# Patient Record
Sex: Male | Born: 1935 | Race: White | Hispanic: No | Marital: Married | Smoking: Never smoker
Health system: Southern US, Community
[De-identification: ages and names within clinical notes are randomized; demographics above are authoritative.]

## PROBLEM LIST (undated history)

## (undated) DIAGNOSIS — G20 Parkinson's disease: Secondary | ICD-10-CM

## (undated) DIAGNOSIS — J449 Chronic obstructive pulmonary disease, unspecified: Secondary | ICD-10-CM

## (undated) DIAGNOSIS — I10 Essential (primary) hypertension: Secondary | ICD-10-CM

## (undated) DIAGNOSIS — I890 Lymphedema, not elsewhere classified: Secondary | ICD-10-CM

## (undated) DIAGNOSIS — C801 Malignant (primary) neoplasm, unspecified: Secondary | ICD-10-CM

## (undated) DIAGNOSIS — G20A1 Parkinson's disease without dyskinesia, without mention of fluctuations: Secondary | ICD-10-CM

## (undated) DIAGNOSIS — R251 Tremor, unspecified: Secondary | ICD-10-CM

## (undated) DIAGNOSIS — E785 Hyperlipidemia, unspecified: Secondary | ICD-10-CM

## (undated) DIAGNOSIS — M4800 Spinal stenosis, site unspecified: Secondary | ICD-10-CM

## (undated) HISTORY — DX: Parkinson's disease: G20

## (undated) HISTORY — PX: CATARACT EXTRACTION: SUR2

## (undated) HISTORY — DX: Spinal stenosis, site unspecified: M48.00

## (undated) HISTORY — DX: Chronic obstructive pulmonary disease, unspecified: J44.9

## (undated) HISTORY — DX: Parkinson's disease without dyskinesia, without mention of fluctuations: G20.A1

## (undated) HISTORY — PX: APPENDECTOMY: SHX54

## (undated) HISTORY — PX: VASECTOMY: SHX75

## (undated) HISTORY — PX: TONSILLECTOMY: SHX5217

## (undated) HISTORY — DX: Essential (primary) hypertension: I10

## (undated) HISTORY — DX: Tremor, unspecified: R25.1

## (undated) HISTORY — DX: Malignant (primary) neoplasm, unspecified: C80.1

## (undated) HISTORY — PX: CHOLECYSTECTOMY: SHX55

## (undated) HISTORY — DX: Lymphedema, not elsewhere classified: I89.0

## (undated) HISTORY — DX: Hyperlipidemia, unspecified: E78.5

---

## 2007-03-28 DIAGNOSIS — R079 Chest pain, unspecified: Secondary | ICD-10-CM | POA: Insufficient documentation

## 2010-10-02 DIAGNOSIS — Z85828 Personal history of other malignant neoplasm of skin: Secondary | ICD-10-CM | POA: Insufficient documentation

## 2010-10-02 DIAGNOSIS — Z872 Personal history of diseases of the skin and subcutaneous tissue: Secondary | ICD-10-CM | POA: Insufficient documentation

## 2011-08-06 DIAGNOSIS — R197 Diarrhea, unspecified: Secondary | ICD-10-CM | POA: Diagnosis not present

## 2011-08-06 DIAGNOSIS — K5730 Diverticulosis of large intestine without perforation or abscess without bleeding: Secondary | ICD-10-CM | POA: Diagnosis not present

## 2011-08-06 DIAGNOSIS — R110 Nausea: Secondary | ICD-10-CM | POA: Diagnosis not present

## 2011-08-06 DIAGNOSIS — R109 Unspecified abdominal pain: Secondary | ICD-10-CM | POA: Diagnosis not present

## 2011-08-06 DIAGNOSIS — R1032 Left lower quadrant pain: Secondary | ICD-10-CM | POA: Diagnosis not present

## 2011-08-25 DIAGNOSIS — H2510 Age-related nuclear cataract, unspecified eye: Secondary | ICD-10-CM | POA: Diagnosis not present

## 2011-08-25 DIAGNOSIS — H04129 Dry eye syndrome of unspecified lacrimal gland: Secondary | ICD-10-CM | POA: Diagnosis not present

## 2011-09-08 DIAGNOSIS — H259 Unspecified age-related cataract: Secondary | ICD-10-CM | POA: Diagnosis not present

## 2011-09-08 DIAGNOSIS — H2510 Age-related nuclear cataract, unspecified eye: Secondary | ICD-10-CM | POA: Diagnosis not present

## 2011-09-28 DIAGNOSIS — I10 Essential (primary) hypertension: Secondary | ICD-10-CM | POA: Diagnosis not present

## 2011-09-28 DIAGNOSIS — Z125 Encounter for screening for malignant neoplasm of prostate: Secondary | ICD-10-CM | POA: Diagnosis not present

## 2011-09-28 DIAGNOSIS — G4700 Insomnia, unspecified: Secondary | ICD-10-CM | POA: Diagnosis not present

## 2011-09-28 DIAGNOSIS — K219 Gastro-esophageal reflux disease without esophagitis: Secondary | ICD-10-CM | POA: Diagnosis not present

## 2011-09-28 DIAGNOSIS — E785 Hyperlipidemia, unspecified: Secondary | ICD-10-CM | POA: Diagnosis not present

## 2011-09-28 DIAGNOSIS — E786 Lipoprotein deficiency: Secondary | ICD-10-CM | POA: Diagnosis not present

## 2011-09-28 DIAGNOSIS — Z0000 Encounter for general adult medical examination without abnormal findings: Secondary | ICD-10-CM | POA: Diagnosis not present

## 2011-09-28 DIAGNOSIS — R5381 Other malaise: Secondary | ICD-10-CM | POA: Diagnosis not present

## 2011-10-07 DIAGNOSIS — Z1212 Encounter for screening for malignant neoplasm of rectum: Secondary | ICD-10-CM | POA: Diagnosis not present

## 2011-10-12 DIAGNOSIS — L909 Atrophic disorder of skin, unspecified: Secondary | ICD-10-CM | POA: Diagnosis not present

## 2011-10-12 DIAGNOSIS — L820 Inflamed seborrheic keratosis: Secondary | ICD-10-CM | POA: Diagnosis not present

## 2011-10-12 DIAGNOSIS — L570 Actinic keratosis: Secondary | ICD-10-CM | POA: Diagnosis not present

## 2011-10-12 DIAGNOSIS — Z85828 Personal history of other malignant neoplasm of skin: Secondary | ICD-10-CM | POA: Diagnosis not present

## 2011-10-12 DIAGNOSIS — L919 Hypertrophic disorder of the skin, unspecified: Secondary | ICD-10-CM | POA: Diagnosis not present

## 2011-12-14 DIAGNOSIS — Z961 Presence of intraocular lens: Secondary | ICD-10-CM | POA: Diagnosis not present

## 2012-03-28 DIAGNOSIS — I10 Essential (primary) hypertension: Secondary | ICD-10-CM | POA: Diagnosis not present

## 2012-03-28 DIAGNOSIS — IMO0002 Reserved for concepts with insufficient information to code with codable children: Secondary | ICD-10-CM | POA: Diagnosis not present

## 2012-03-28 DIAGNOSIS — M1710 Unilateral primary osteoarthritis, unspecified knee: Secondary | ICD-10-CM | POA: Diagnosis not present

## 2012-03-28 DIAGNOSIS — E785 Hyperlipidemia, unspecified: Secondary | ICD-10-CM | POA: Diagnosis not present

## 2012-03-28 DIAGNOSIS — K219 Gastro-esophageal reflux disease without esophagitis: Secondary | ICD-10-CM | POA: Diagnosis not present

## 2012-03-29 DIAGNOSIS — N509 Disorder of male genital organs, unspecified: Secondary | ICD-10-CM | POA: Diagnosis not present

## 2012-03-29 DIAGNOSIS — N400 Enlarged prostate without lower urinary tract symptoms: Secondary | ICD-10-CM | POA: Diagnosis not present

## 2012-04-14 DIAGNOSIS — Z1389 Encounter for screening for other disorder: Secondary | ICD-10-CM | POA: Insufficient documentation

## 2012-04-14 DIAGNOSIS — Z872 Personal history of diseases of the skin and subcutaneous tissue: Secondary | ICD-10-CM | POA: Diagnosis not present

## 2012-04-14 DIAGNOSIS — Z85828 Personal history of other malignant neoplasm of skin: Secondary | ICD-10-CM | POA: Diagnosis not present

## 2012-04-14 DIAGNOSIS — L820 Inflamed seborrheic keratosis: Secondary | ICD-10-CM | POA: Diagnosis not present

## 2012-05-08 DIAGNOSIS — Z800 Family history of malignant neoplasm of digestive organs: Secondary | ICD-10-CM | POA: Diagnosis not present

## 2012-05-08 DIAGNOSIS — Z23 Encounter for immunization: Secondary | ICD-10-CM | POA: Diagnosis not present

## 2012-05-08 DIAGNOSIS — C44319 Basal cell carcinoma of skin of other parts of face: Secondary | ICD-10-CM | POA: Diagnosis not present

## 2012-05-08 DIAGNOSIS — C44721 Squamous cell carcinoma of skin of unspecified lower limb, including hip: Secondary | ICD-10-CM | POA: Diagnosis not present

## 2012-05-08 DIAGNOSIS — M4802 Spinal stenosis, cervical region: Secondary | ICD-10-CM | POA: Diagnosis not present

## 2012-05-08 DIAGNOSIS — I6350 Cerebral infarction due to unspecified occlusion or stenosis of unspecified cerebral artery: Secondary | ICD-10-CM | POA: Diagnosis not present

## 2012-05-08 DIAGNOSIS — M5126 Other intervertebral disc displacement, lumbar region: Secondary | ICD-10-CM | POA: Diagnosis not present

## 2012-05-08 DIAGNOSIS — M48061 Spinal stenosis, lumbar region without neurogenic claudication: Secondary | ICD-10-CM | POA: Diagnosis not present

## 2012-10-04 DIAGNOSIS — M48061 Spinal stenosis, lumbar region without neurogenic claudication: Secondary | ICD-10-CM | POA: Diagnosis not present

## 2012-10-04 DIAGNOSIS — M4802 Spinal stenosis, cervical region: Secondary | ICD-10-CM | POA: Diagnosis not present

## 2012-10-04 DIAGNOSIS — Z23 Encounter for immunization: Secondary | ICD-10-CM | POA: Diagnosis not present

## 2012-10-04 DIAGNOSIS — I6350 Cerebral infarction due to unspecified occlusion or stenosis of unspecified cerebral artery: Secondary | ICD-10-CM | POA: Diagnosis not present

## 2012-10-04 DIAGNOSIS — Z125 Encounter for screening for malignant neoplasm of prostate: Secondary | ICD-10-CM | POA: Diagnosis not present

## 2012-10-04 DIAGNOSIS — C44721 Squamous cell carcinoma of skin of unspecified lower limb, including hip: Secondary | ICD-10-CM | POA: Diagnosis not present

## 2012-10-04 DIAGNOSIS — IMO0002 Reserved for concepts with insufficient information to code with codable children: Secondary | ICD-10-CM | POA: Diagnosis not present

## 2012-10-04 DIAGNOSIS — Z800 Family history of malignant neoplasm of digestive organs: Secondary | ICD-10-CM | POA: Diagnosis not present

## 2012-10-04 DIAGNOSIS — I10 Essential (primary) hypertension: Secondary | ICD-10-CM | POA: Diagnosis not present

## 2012-10-04 DIAGNOSIS — E785 Hyperlipidemia, unspecified: Secondary | ICD-10-CM | POA: Diagnosis not present

## 2012-10-04 DIAGNOSIS — M5126 Other intervertebral disc displacement, lumbar region: Secondary | ICD-10-CM | POA: Diagnosis not present

## 2012-10-16 DIAGNOSIS — Z1212 Encounter for screening for malignant neoplasm of rectum: Secondary | ICD-10-CM | POA: Diagnosis not present

## 2012-11-03 DIAGNOSIS — L821 Other seborrheic keratosis: Secondary | ICD-10-CM | POA: Diagnosis not present

## 2012-11-03 DIAGNOSIS — Z85828 Personal history of other malignant neoplasm of skin: Secondary | ICD-10-CM | POA: Diagnosis not present

## 2012-11-03 DIAGNOSIS — L570 Actinic keratosis: Secondary | ICD-10-CM | POA: Diagnosis not present

## 2012-11-06 DIAGNOSIS — H35039 Hypertensive retinopathy, unspecified eye: Secondary | ICD-10-CM | POA: Diagnosis not present

## 2012-11-06 DIAGNOSIS — H2589 Other age-related cataract: Secondary | ICD-10-CM | POA: Diagnosis not present

## 2012-11-06 DIAGNOSIS — Z961 Presence of intraocular lens: Secondary | ICD-10-CM | POA: Diagnosis not present

## 2012-11-06 DIAGNOSIS — H04129 Dry eye syndrome of unspecified lacrimal gland: Secondary | ICD-10-CM | POA: Diagnosis not present

## 2012-11-07 DIAGNOSIS — Z1211 Encounter for screening for malignant neoplasm of colon: Secondary | ICD-10-CM | POA: Diagnosis not present

## 2012-11-07 DIAGNOSIS — D126 Benign neoplasm of colon, unspecified: Secondary | ICD-10-CM | POA: Diagnosis not present

## 2012-11-08 DIAGNOSIS — M48061 Spinal stenosis, lumbar region without neurogenic claudication: Secondary | ICD-10-CM | POA: Diagnosis not present

## 2012-11-08 DIAGNOSIS — R259 Unspecified abnormal involuntary movements: Secondary | ICD-10-CM | POA: Diagnosis not present

## 2012-11-08 DIAGNOSIS — M4802 Spinal stenosis, cervical region: Secondary | ICD-10-CM | POA: Diagnosis not present

## 2012-11-08 DIAGNOSIS — G20 Parkinson's disease: Secondary | ICD-10-CM | POA: Diagnosis not present

## 2012-11-09 DIAGNOSIS — M4802 Spinal stenosis, cervical region: Secondary | ICD-10-CM | POA: Diagnosis not present

## 2012-11-09 DIAGNOSIS — M48061 Spinal stenosis, lumbar region without neurogenic claudication: Secondary | ICD-10-CM | POA: Diagnosis not present

## 2012-11-09 DIAGNOSIS — R259 Unspecified abnormal involuntary movements: Secondary | ICD-10-CM | POA: Diagnosis not present

## 2012-11-09 DIAGNOSIS — G20 Parkinson's disease: Secondary | ICD-10-CM | POA: Diagnosis not present

## 2013-02-23 DIAGNOSIS — M48061 Spinal stenosis, lumbar region without neurogenic claudication: Secondary | ICD-10-CM | POA: Diagnosis not present

## 2013-02-23 DIAGNOSIS — R259 Unspecified abnormal involuntary movements: Secondary | ICD-10-CM | POA: Diagnosis not present

## 2013-02-23 DIAGNOSIS — G20 Parkinson's disease: Secondary | ICD-10-CM | POA: Diagnosis not present

## 2013-02-23 DIAGNOSIS — M4802 Spinal stenosis, cervical region: Secondary | ICD-10-CM | POA: Diagnosis not present

## 2013-03-05 DIAGNOSIS — R1084 Generalized abdominal pain: Secondary | ICD-10-CM | POA: Diagnosis not present

## 2013-03-05 DIAGNOSIS — M438X9 Other specified deforming dorsopathies, site unspecified: Secondary | ICD-10-CM | POA: Diagnosis not present

## 2013-03-05 DIAGNOSIS — K5900 Constipation, unspecified: Secondary | ICD-10-CM | POA: Diagnosis not present

## 2013-03-05 DIAGNOSIS — K219 Gastro-esophageal reflux disease without esophagitis: Secondary | ICD-10-CM | POA: Diagnosis not present

## 2013-03-29 DIAGNOSIS — M48061 Spinal stenosis, lumbar region without neurogenic claudication: Secondary | ICD-10-CM | POA: Diagnosis not present

## 2013-03-29 DIAGNOSIS — Z800 Family history of malignant neoplasm of digestive organs: Secondary | ICD-10-CM | POA: Diagnosis not present

## 2013-03-29 DIAGNOSIS — J309 Allergic rhinitis, unspecified: Secondary | ICD-10-CM | POA: Diagnosis not present

## 2013-03-29 DIAGNOSIS — M5126 Other intervertebral disc displacement, lumbar region: Secondary | ICD-10-CM | POA: Diagnosis not present

## 2013-03-29 DIAGNOSIS — M1710 Unilateral primary osteoarthritis, unspecified knee: Secondary | ICD-10-CM | POA: Diagnosis not present

## 2013-03-29 DIAGNOSIS — IMO0002 Reserved for concepts with insufficient information to code with codable children: Secondary | ICD-10-CM | POA: Diagnosis not present

## 2013-03-29 DIAGNOSIS — M4802 Spinal stenosis, cervical region: Secondary | ICD-10-CM | POA: Diagnosis not present

## 2013-03-29 DIAGNOSIS — I6350 Cerebral infarction due to unspecified occlusion or stenosis of unspecified cerebral artery: Secondary | ICD-10-CM | POA: Diagnosis not present

## 2013-03-29 DIAGNOSIS — K5730 Diverticulosis of large intestine without perforation or abscess without bleeding: Secondary | ICD-10-CM | POA: Diagnosis not present

## 2013-03-29 DIAGNOSIS — I10 Essential (primary) hypertension: Secondary | ICD-10-CM | POA: Diagnosis not present

## 2013-03-30 DIAGNOSIS — N400 Enlarged prostate without lower urinary tract symptoms: Secondary | ICD-10-CM | POA: Diagnosis not present

## 2013-04-18 DIAGNOSIS — K3189 Other diseases of stomach and duodenum: Secondary | ICD-10-CM | POA: Diagnosis not present

## 2013-04-18 DIAGNOSIS — D131 Benign neoplasm of stomach: Secondary | ICD-10-CM | POA: Diagnosis not present

## 2013-04-18 DIAGNOSIS — K219 Gastro-esophageal reflux disease without esophagitis: Secondary | ICD-10-CM | POA: Diagnosis not present

## 2013-05-04 DIAGNOSIS — Z23 Encounter for immunization: Secondary | ICD-10-CM | POA: Diagnosis not present

## 2013-05-11 DIAGNOSIS — B351 Tinea unguium: Secondary | ICD-10-CM | POA: Diagnosis not present

## 2013-05-11 DIAGNOSIS — Z872 Personal history of diseases of the skin and subcutaneous tissue: Secondary | ICD-10-CM | POA: Diagnosis not present

## 2013-05-11 DIAGNOSIS — Z85828 Personal history of other malignant neoplasm of skin: Secondary | ICD-10-CM | POA: Diagnosis not present

## 2013-05-16 DIAGNOSIS — H4010X0 Unspecified open-angle glaucoma, stage unspecified: Secondary | ICD-10-CM | POA: Diagnosis not present

## 2013-06-01 DIAGNOSIS — R259 Unspecified abnormal involuntary movements: Secondary | ICD-10-CM | POA: Diagnosis not present

## 2013-06-01 DIAGNOSIS — G20 Parkinson's disease: Secondary | ICD-10-CM | POA: Diagnosis not present

## 2013-06-01 DIAGNOSIS — M4802 Spinal stenosis, cervical region: Secondary | ICD-10-CM | POA: Diagnosis not present

## 2013-06-01 DIAGNOSIS — M48061 Spinal stenosis, lumbar region without neurogenic claudication: Secondary | ICD-10-CM | POA: Diagnosis not present

## 2013-06-07 DIAGNOSIS — G20 Parkinson's disease: Secondary | ICD-10-CM | POA: Diagnosis not present

## 2013-06-07 DIAGNOSIS — M47812 Spondylosis without myelopathy or radiculopathy, cervical region: Secondary | ICD-10-CM | POA: Diagnosis not present

## 2013-06-07 DIAGNOSIS — R42 Dizziness and giddiness: Secondary | ICD-10-CM | POA: Diagnosis not present

## 2013-06-07 DIAGNOSIS — M5030 Other cervical disc degeneration, unspecified cervical region: Secondary | ICD-10-CM | POA: Diagnosis not present

## 2013-06-12 DIAGNOSIS — H179 Unspecified corneal scar and opacity: Secondary | ICD-10-CM | POA: Diagnosis not present

## 2013-06-12 DIAGNOSIS — H35379 Puckering of macula, unspecified eye: Secondary | ICD-10-CM | POA: Diagnosis not present

## 2013-06-12 DIAGNOSIS — E119 Type 2 diabetes mellitus without complications: Secondary | ICD-10-CM | POA: Diagnosis not present

## 2013-06-12 DIAGNOSIS — H4010X0 Unspecified open-angle glaucoma, stage unspecified: Secondary | ICD-10-CM | POA: Diagnosis not present

## 2013-07-06 DIAGNOSIS — G20 Parkinson's disease: Secondary | ICD-10-CM | POA: Diagnosis not present

## 2013-07-06 DIAGNOSIS — R259 Unspecified abnormal involuntary movements: Secondary | ICD-10-CM | POA: Diagnosis not present

## 2013-07-06 DIAGNOSIS — M4802 Spinal stenosis, cervical region: Secondary | ICD-10-CM | POA: Diagnosis not present

## 2013-07-06 DIAGNOSIS — M48061 Spinal stenosis, lumbar region without neurogenic claudication: Secondary | ICD-10-CM | POA: Diagnosis not present

## 2013-09-26 DIAGNOSIS — B029 Zoster without complications: Secondary | ICD-10-CM | POA: Diagnosis not present

## 2013-10-16 DIAGNOSIS — R0789 Other chest pain: Secondary | ICD-10-CM | POA: Diagnosis not present

## 2013-10-16 DIAGNOSIS — N400 Enlarged prostate without lower urinary tract symptoms: Secondary | ICD-10-CM | POA: Diagnosis not present

## 2013-10-16 DIAGNOSIS — Z85828 Personal history of other malignant neoplasm of skin: Secondary | ICD-10-CM | POA: Diagnosis not present

## 2013-10-16 DIAGNOSIS — Z885 Allergy status to narcotic agent status: Secondary | ICD-10-CM | POA: Diagnosis not present

## 2013-10-16 DIAGNOSIS — R079 Chest pain, unspecified: Secondary | ICD-10-CM | POA: Diagnosis not present

## 2013-10-16 DIAGNOSIS — K219 Gastro-esophageal reflux disease without esophagitis: Secondary | ICD-10-CM | POA: Diagnosis not present

## 2013-10-16 DIAGNOSIS — E7800 Pure hypercholesterolemia, unspecified: Secondary | ICD-10-CM | POA: Diagnosis not present

## 2013-10-16 DIAGNOSIS — Z79899 Other long term (current) drug therapy: Secondary | ICD-10-CM | POA: Diagnosis not present

## 2013-10-16 DIAGNOSIS — Z886 Allergy status to analgesic agent status: Secondary | ICD-10-CM | POA: Diagnosis not present

## 2013-10-16 DIAGNOSIS — Z7982 Long term (current) use of aspirin: Secondary | ICD-10-CM | POA: Diagnosis not present

## 2013-10-16 DIAGNOSIS — I10 Essential (primary) hypertension: Secondary | ICD-10-CM | POA: Diagnosis not present

## 2013-10-17 DIAGNOSIS — K219 Gastro-esophageal reflux disease without esophagitis: Secondary | ICD-10-CM | POA: Diagnosis not present

## 2013-10-17 DIAGNOSIS — R079 Chest pain, unspecified: Secondary | ICD-10-CM | POA: Diagnosis not present

## 2013-10-24 DIAGNOSIS — R079 Chest pain, unspecified: Secondary | ICD-10-CM | POA: Diagnosis not present

## 2013-10-31 DIAGNOSIS — R079 Chest pain, unspecified: Secondary | ICD-10-CM | POA: Diagnosis not present

## 2013-11-01 DIAGNOSIS — R259 Unspecified abnormal involuntary movements: Secondary | ICD-10-CM | POA: Diagnosis not present

## 2013-11-01 DIAGNOSIS — E782 Mixed hyperlipidemia: Secondary | ICD-10-CM | POA: Diagnosis not present

## 2013-11-01 DIAGNOSIS — N138 Other obstructive and reflux uropathy: Secondary | ICD-10-CM | POA: Diagnosis not present

## 2013-11-01 DIAGNOSIS — I6350 Cerebral infarction due to unspecified occlusion or stenosis of unspecified cerebral artery: Secondary | ICD-10-CM | POA: Diagnosis not present

## 2013-11-01 DIAGNOSIS — Z0000 Encounter for general adult medical examination without abnormal findings: Secondary | ICD-10-CM | POA: Diagnosis not present

## 2013-11-01 DIAGNOSIS — R079 Chest pain, unspecified: Secondary | ICD-10-CM | POA: Diagnosis not present

## 2013-11-01 DIAGNOSIS — K5730 Diverticulosis of large intestine without perforation or abscess without bleeding: Secondary | ICD-10-CM | POA: Diagnosis not present

## 2013-11-01 DIAGNOSIS — N401 Enlarged prostate with lower urinary tract symptoms: Secondary | ICD-10-CM | POA: Diagnosis not present

## 2013-11-01 DIAGNOSIS — I10 Essential (primary) hypertension: Secondary | ICD-10-CM | POA: Diagnosis not present

## 2013-11-06 DIAGNOSIS — I10 Essential (primary) hypertension: Secondary | ICD-10-CM | POA: Diagnosis not present

## 2013-11-06 DIAGNOSIS — K5730 Diverticulosis of large intestine without perforation or abscess without bleeding: Secondary | ICD-10-CM | POA: Diagnosis not present

## 2013-11-06 DIAGNOSIS — E782 Mixed hyperlipidemia: Secondary | ICD-10-CM | POA: Diagnosis not present

## 2013-11-06 DIAGNOSIS — N401 Enlarged prostate with lower urinary tract symptoms: Secondary | ICD-10-CM | POA: Diagnosis not present

## 2013-11-06 DIAGNOSIS — J9801 Acute bronchospasm: Secondary | ICD-10-CM | POA: Diagnosis not present

## 2013-11-06 DIAGNOSIS — R259 Unspecified abnormal involuntary movements: Secondary | ICD-10-CM | POA: Diagnosis not present

## 2013-11-06 DIAGNOSIS — N138 Other obstructive and reflux uropathy: Secondary | ICD-10-CM | POA: Diagnosis not present

## 2013-11-12 DIAGNOSIS — H2510 Age-related nuclear cataract, unspecified eye: Secondary | ICD-10-CM | POA: Diagnosis not present

## 2013-11-12 DIAGNOSIS — Z961 Presence of intraocular lens: Secondary | ICD-10-CM | POA: Diagnosis not present

## 2013-11-12 DIAGNOSIS — H04129 Dry eye syndrome of unspecified lacrimal gland: Secondary | ICD-10-CM | POA: Diagnosis not present

## 2013-11-16 DIAGNOSIS — L570 Actinic keratosis: Secondary | ICD-10-CM | POA: Diagnosis not present

## 2013-11-16 DIAGNOSIS — C44319 Basal cell carcinoma of skin of other parts of face: Secondary | ICD-10-CM | POA: Diagnosis not present

## 2013-11-16 DIAGNOSIS — L821 Other seborrheic keratosis: Secondary | ICD-10-CM | POA: Diagnosis not present

## 2013-11-16 DIAGNOSIS — Z872 Personal history of diseases of the skin and subcutaneous tissue: Secondary | ICD-10-CM | POA: Diagnosis not present

## 2013-11-16 DIAGNOSIS — Z85828 Personal history of other malignant neoplasm of skin: Secondary | ICD-10-CM | POA: Diagnosis not present

## 2013-11-30 DIAGNOSIS — M4802 Spinal stenosis, cervical region: Secondary | ICD-10-CM | POA: Insufficient documentation

## 2013-11-30 DIAGNOSIS — G20 Parkinson's disease: Secondary | ICD-10-CM | POA: Diagnosis not present

## 2013-11-30 DIAGNOSIS — R251 Tremor, unspecified: Secondary | ICD-10-CM | POA: Insufficient documentation

## 2013-11-30 DIAGNOSIS — R259 Unspecified abnormal involuntary movements: Secondary | ICD-10-CM | POA: Diagnosis not present

## 2013-11-30 DIAGNOSIS — M48061 Spinal stenosis, lumbar region without neurogenic claudication: Secondary | ICD-10-CM | POA: Diagnosis not present

## 2013-12-25 DIAGNOSIS — C4401 Basal cell carcinoma of skin of lip: Secondary | ICD-10-CM | POA: Diagnosis not present

## 2014-02-01 DIAGNOSIS — Z23 Encounter for immunization: Secondary | ICD-10-CM | POA: Diagnosis not present

## 2014-02-18 DIAGNOSIS — G20 Parkinson's disease: Secondary | ICD-10-CM | POA: Diagnosis not present

## 2014-02-18 DIAGNOSIS — M4802 Spinal stenosis, cervical region: Secondary | ICD-10-CM | POA: Diagnosis not present

## 2014-02-18 DIAGNOSIS — R259 Unspecified abnormal involuntary movements: Secondary | ICD-10-CM | POA: Diagnosis not present

## 2014-02-18 DIAGNOSIS — M48061 Spinal stenosis, lumbar region without neurogenic claudication: Secondary | ICD-10-CM | POA: Diagnosis not present

## 2014-03-07 ENCOUNTER — Other Ambulatory Visit: Payer: Self-pay | Admitting: Unknown Physician Specialty

## 2014-03-07 ENCOUNTER — Ambulatory Visit (INDEPENDENT_AMBULATORY_CARE_PROVIDER_SITE_OTHER): Payer: Medicare Other

## 2014-03-07 DIAGNOSIS — R059 Cough, unspecified: Secondary | ICD-10-CM

## 2014-03-07 DIAGNOSIS — R05 Cough: Secondary | ICD-10-CM | POA: Diagnosis not present

## 2014-03-07 DIAGNOSIS — J9801 Acute bronchospasm: Secondary | ICD-10-CM | POA: Diagnosis not present

## 2014-03-29 DIAGNOSIS — Z23 Encounter for immunization: Secondary | ICD-10-CM | POA: Diagnosis not present

## 2014-04-30 DIAGNOSIS — E782 Mixed hyperlipidemia: Secondary | ICD-10-CM | POA: Diagnosis not present

## 2014-04-30 DIAGNOSIS — I10 Essential (primary) hypertension: Secondary | ICD-10-CM | POA: Diagnosis not present

## 2014-04-30 DIAGNOSIS — K219 Gastro-esophageal reflux disease without esophagitis: Secondary | ICD-10-CM | POA: Diagnosis not present

## 2014-05-20 DIAGNOSIS — M4802 Spinal stenosis, cervical region: Secondary | ICD-10-CM | POA: Diagnosis not present

## 2014-05-20 DIAGNOSIS — G20 Parkinson's disease: Secondary | ICD-10-CM | POA: Diagnosis not present

## 2014-05-20 DIAGNOSIS — R251 Tremor, unspecified: Secondary | ICD-10-CM | POA: Diagnosis not present

## 2014-05-20 DIAGNOSIS — M4806 Spinal stenosis, lumbar region: Secondary | ICD-10-CM | POA: Diagnosis not present

## 2014-05-21 DIAGNOSIS — Z872 Personal history of diseases of the skin and subcutaneous tissue: Secondary | ICD-10-CM | POA: Diagnosis not present

## 2014-05-21 DIAGNOSIS — Z85828 Personal history of other malignant neoplasm of skin: Secondary | ICD-10-CM | POA: Diagnosis not present

## 2014-07-31 DIAGNOSIS — I8312 Varicose veins of left lower extremity with inflammation: Secondary | ICD-10-CM | POA: Diagnosis not present

## 2014-07-31 DIAGNOSIS — R062 Wheezing: Secondary | ICD-10-CM | POA: Diagnosis not present

## 2014-07-31 DIAGNOSIS — R600 Localized edema: Secondary | ICD-10-CM | POA: Diagnosis not present

## 2014-08-19 DIAGNOSIS — G20 Parkinson's disease: Secondary | ICD-10-CM | POA: Diagnosis not present

## 2014-08-19 DIAGNOSIS — R251 Tremor, unspecified: Secondary | ICD-10-CM | POA: Diagnosis not present

## 2014-08-19 DIAGNOSIS — M4806 Spinal stenosis, lumbar region: Secondary | ICD-10-CM | POA: Diagnosis not present

## 2014-08-19 DIAGNOSIS — M4802 Spinal stenosis, cervical region: Secondary | ICD-10-CM | POA: Diagnosis not present

## 2014-09-09 DIAGNOSIS — R251 Tremor, unspecified: Secondary | ICD-10-CM | POA: Diagnosis not present

## 2014-09-09 DIAGNOSIS — M4806 Spinal stenosis, lumbar region: Secondary | ICD-10-CM | POA: Diagnosis not present

## 2014-09-09 DIAGNOSIS — G20 Parkinson's disease: Secondary | ICD-10-CM | POA: Diagnosis not present

## 2014-09-09 DIAGNOSIS — M4802 Spinal stenosis, cervical region: Secondary | ICD-10-CM | POA: Diagnosis not present

## 2014-09-13 DIAGNOSIS — R600 Localized edema: Secondary | ICD-10-CM | POA: Diagnosis not present

## 2014-10-26 ENCOUNTER — Emergency Department
Admit: 2014-10-26 | Disposition: A | Discharge status: Home / Self Care | Payer: Self-pay | Admitting: Emergency Medicine

## 2014-10-26 DIAGNOSIS — I10 Essential (primary) hypertension: Secondary | ICD-10-CM | POA: Diagnosis not present

## 2014-10-26 DIAGNOSIS — Z7982 Long term (current) use of aspirin: Secondary | ICD-10-CM | POA: Diagnosis not present

## 2014-10-26 DIAGNOSIS — Z79899 Other long term (current) drug therapy: Secondary | ICD-10-CM | POA: Diagnosis not present

## 2014-10-27 LAB — BASIC METABOLIC PANEL
Anion Gap: 8 (ref 7–16)
BUN: 13 mg/dL
Calcium, Total: 9.2 mg/dL
Chloride: 103 mmol/L
Co2: 28 mmol/L
Creatinine: 0.96 mg/dL
EGFR (African American): >60
EGFR (Non-African Amer.): >60
Glucose: 111 mg/dL — ABNORMAL HIGH
Potassium: 3.5 mmol/L
Sodium: 139 mmol/L

## 2014-10-27 LAB — CBC
HCT: 46.4 % (ref 40.0–52.0)
HGB: 15.8 g/dL (ref 13.0–18.0)
MCH: 30.7 pg (ref 26.0–34.0)
MCHC: 34.1 g/dL (ref 32.0–36.0)
MCV: 90 fL (ref 80–100)
Platelet: 179 10*3/uL (ref 150–440)
RBC: 5.16 10*6/uL (ref 4.40–5.90)
RDW: 14.7 % — ABNORMAL HIGH (ref 11.5–14.5)
WBC: 7.3 10*3/uL (ref 3.8–10.6)

## 2014-10-27 LAB — TROPONIN I: Troponin-I: <0.03 ng/mL

## 2014-11-08 DIAGNOSIS — R251 Tremor, unspecified: Secondary | ICD-10-CM | POA: Diagnosis not present

## 2014-11-08 DIAGNOSIS — M4806 Spinal stenosis, lumbar region: Secondary | ICD-10-CM | POA: Diagnosis not present

## 2014-11-08 DIAGNOSIS — M4802 Spinal stenosis, cervical region: Secondary | ICD-10-CM | POA: Diagnosis not present

## 2014-11-08 DIAGNOSIS — G20 Parkinson's disease: Secondary | ICD-10-CM | POA: Diagnosis not present

## 2014-11-13 DIAGNOSIS — Z79899 Other long term (current) drug therapy: Secondary | ICD-10-CM | POA: Diagnosis not present

## 2014-11-13 DIAGNOSIS — K5790 Diverticulosis of intestine, part unspecified, without perforation or abscess without bleeding: Secondary | ICD-10-CM | POA: Diagnosis not present

## 2014-11-13 DIAGNOSIS — Z23 Encounter for immunization: Secondary | ICD-10-CM | POA: Diagnosis not present

## 2014-11-13 DIAGNOSIS — N400 Enlarged prostate without lower urinary tract symptoms: Secondary | ICD-10-CM | POA: Diagnosis not present

## 2014-11-13 DIAGNOSIS — E782 Mixed hyperlipidemia: Secondary | ICD-10-CM | POA: Diagnosis not present

## 2014-11-13 DIAGNOSIS — I10 Essential (primary) hypertension: Secondary | ICD-10-CM | POA: Diagnosis not present

## 2014-11-13 DIAGNOSIS — Z0000 Encounter for general adult medical examination without abnormal findings: Secondary | ICD-10-CM | POA: Diagnosis not present

## 2014-11-13 DIAGNOSIS — Z1211 Encounter for screening for malignant neoplasm of colon: Secondary | ICD-10-CM | POA: Diagnosis not present

## 2014-11-21 DIAGNOSIS — Z79899 Other long term (current) drug therapy: Secondary | ICD-10-CM | POA: Diagnosis not present

## 2014-11-21 DIAGNOSIS — Z1211 Encounter for screening for malignant neoplasm of colon: Secondary | ICD-10-CM | POA: Diagnosis not present

## 2014-11-22 DIAGNOSIS — Z85828 Personal history of other malignant neoplasm of skin: Secondary | ICD-10-CM | POA: Diagnosis not present

## 2014-11-22 DIAGNOSIS — Z872 Personal history of diseases of the skin and subcutaneous tissue: Secondary | ICD-10-CM | POA: Diagnosis not present

## 2015-01-10 DIAGNOSIS — R251 Tremor, unspecified: Secondary | ICD-10-CM | POA: Diagnosis not present

## 2015-01-10 DIAGNOSIS — M4806 Spinal stenosis, lumbar region: Secondary | ICD-10-CM | POA: Diagnosis not present

## 2015-01-10 DIAGNOSIS — M4802 Spinal stenosis, cervical region: Secondary | ICD-10-CM | POA: Diagnosis not present

## 2015-01-10 DIAGNOSIS — G20 Parkinson's disease: Secondary | ICD-10-CM | POA: Diagnosis not present

## 2015-02-10 DIAGNOSIS — H04123 Dry eye syndrome of bilateral lacrimal glands: Secondary | ICD-10-CM | POA: Diagnosis not present

## 2015-02-10 DIAGNOSIS — H35033 Hypertensive retinopathy, bilateral: Secondary | ICD-10-CM | POA: Diagnosis not present

## 2015-02-10 DIAGNOSIS — Z79899 Other long term (current) drug therapy: Secondary | ICD-10-CM | POA: Diagnosis not present

## 2015-02-10 DIAGNOSIS — H2511 Age-related nuclear cataract, right eye: Secondary | ICD-10-CM | POA: Diagnosis not present

## 2015-02-21 ENCOUNTER — Ambulatory Visit (INDEPENDENT_AMBULATORY_CARE_PROVIDER_SITE_OTHER): Payer: Medicare Other | Admitting: Neurology

## 2015-02-21 ENCOUNTER — Encounter: Payer: Self-pay | Admitting: Neurology

## 2015-02-21 VITALS — BP 106/73 | HR 96 | Resp 16 | Ht 70.5 in | Wt 191.0 lb

## 2015-02-21 DIAGNOSIS — M25561 Pain in right knee: Secondary | ICD-10-CM

## 2015-02-21 DIAGNOSIS — G20A1 Parkinson's disease without dyskinesia, without mention of fluctuations: Secondary | ICD-10-CM | POA: Insufficient documentation

## 2015-02-21 DIAGNOSIS — G20 Parkinson's disease: Secondary | ICD-10-CM | POA: Diagnosis not present

## 2015-02-21 DIAGNOSIS — G20C Parkinsonism, unspecified: Secondary | ICD-10-CM

## 2015-02-21 DIAGNOSIS — Z789 Other specified health status: Secondary | ICD-10-CM

## 2015-02-21 HISTORY — DX: Parkinson's disease: G20

## 2015-02-21 HISTORY — DX: Parkinsonism, unspecified: G20.C

## 2015-02-21 MED ORDER — CARBIDOPA-LEVODOPA ER 23.75-95 MG PO CPCR: 95.0000 mg | ORAL_CAPSULE | Freq: Three times a day (TID) | ORAL | Status: DC

## 2015-02-21 NOTE — Patient Instructions (Signed)
We will try for your left sided parkinsonism: Rytary 95 mg strength: take 1 pill three times a day. Please try to take the medication away from you mealtimes, that is, ideally either one hour before or 2 hours after your meal to ensure optimal absorption. The medication can interfere with the protein content of your meal and trying to the protein in your food and therefore not get fully absorbed.  Common side effects reported are: Nausea, vomiting, sedation, confusion, lightheadedness. Rare side effects include hallucinations, severe nausea or vomiting, diarrhea and significant drop in blood pressure especially when going from lying to standing or from sitting to standing.   Call in about 10 to 14 days to report, how Rytary is working for you. At that time I can give you a new prescription and we can fine tune your dose.  Try to stay active physically. Follow up in 3 months.   As discussed, we will schedule you for a special brain scan at [facility], called DaT scan and we will call you with the results.

## 2015-02-21 NOTE — Progress Notes (Signed)
Subjective:    Patient ID: Adam Moss is a 81 y.o. male.  HPI     Star Age, MD, PhD [facility] [address], [address] [place], [place] [address]  Dear Donella Stade,  I saw your patient, Adam Moss, upon your kind request in my neurologic clinic today for initial consultation of his Parkinson's disease. The patient is unaccompanied by today. As you know, [name] is a very pleasant 81 year old left -handed gentleman with an underlying medical history of degenerative spine disease, allergies, BPH, hypertension, arthritis, reflux disease, and insomnia, who has a history of left-sided tremor, fine motor dyscontrol, and gait difficulty. 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 Symptoms date back to 2014 or the year before, when he started having difficulty getting out of a chair, tremors on the left side, slowness, and he started seeing you at the neuroscience center in [facility]. He was diagnosed with Parkinson's disease and tried on different medications but had side effects, primarily GI related side effects. He has a history of cholecystectomy. Sinemet caused severe abdominal pain and nausea. He was tried on Neupro which also caused stomach pain, and on Azilect low-dose he did not think he had any response. He has been on amantadine. His previous records were reviewed: This includes neurologic office notes from 01/10/2015, 11/08/2014, 09/09/2014, 08/19/2014, 05/20/2014, 02/18/2014, 11/30/2013, 07/06/2013, 06/01/2013, and 02/23/2013. He had a brain MRI years ago but results are not available for my review. He is currently on amantadine 100 mg twice daily. In 2015 he was tried on pramipexole but had severe GI upset. In July 2015 he reported that he had improvement with Neupro but stomach pain. In January 2016 he tried Sinemet but had to stop  after a few days only. Earlier this year his Azilect was discontinued. He has no family history of Parkinson's disease. He reports mild memory loss and mild sleep issues. He currently resides in independent living at the [facility] [place]. This is in [place]. His wife is a Marine scientist care. He drives. He has no recent history of falls. He tries to stay active physically. He has right knee pain and wears a soft brace around his right knee. He has a remote history of injury to his right knee. He has been off of amantadine for the past 2 weeks or so. He had significant blurry vision while on it to the point where he had to see an ophthalmologist. His blurry vision has improved since he stopped the amantadine. He feels that his symptoms have been slowly progressive. He is primarily bothered by his fine motor dyscontrol and tremor. He drives without significant problems but does not drive long distances.  His Past Medical History Is Significant For: Past Medical History  Diagnosis Date  . Tremor   . Spinal stenosis   . Parkinson disease   . Hypertension   . Cancer     skin  . Hyperlipemia   . Parkinsonism 02/21/2015    His Past Surgical History Is Significant For: Past Surgical History  Procedure Laterality Date  . Appendectomy    . Tonsillectomy    . Vasectomy    . Cholecystectomy    . Cataract extraction      His Family History Is Significant For: Family History  Problem Relation Age of Onset  . Cancer Mother   . Heart disease Father  His Social History Is Significant For: History   Social History  . Marital Status: Married    Spouse Name: N/A  . Number of Children: 3  . Years of Education: PhD   Occupational History  . Retired    Social History Main Topics  . Smoking status: Never Smoker   . Smokeless tobacco: Not on file  . Alcohol Use: No  . Drug Use: No  . Sexual Activity: Not on file   Other Topics Concern  . None   Social History Narrative   Denies  caffeine use     His Allergies Are:  Allergies  Allergen Reactions  . Carbidopa-Levodopa Other (See Comments)    Severe stomach pains  . Iodinated Diagnostic Agents     Never used  . Tylenol [Acetaminophen]   . Pramipexole Nausea Only  :   His Current Medications Are:  Outpatient Encounter Prescriptions as of 02/21/2015  Medication Sig  . aspirin 81 MG tablet Take 81 mg by mouth daily.  . Calcium Citrate 250 MG TABS Take by mouth.  . cetirizine (ZYRTEC) 10 MG tablet Take 10 mg by mouth daily.  . finasteride (PROSCAR) 5 MG tablet Take 5 mg by mouth daily.  . folic acid (FOLVITE) 1 MG tablet Take 1 mg by mouth daily.  . furosemide (LASIX) 20 MG tablet Take 20 mg by mouth.  . meloxicam (MOBIC) 15 MG tablet Take 15 mg by mouth daily.  . Multiple Vitamin (MULTIVITAMIN) capsule Take 1 capsule by mouth daily.  . niacin 500 MG tablet Take 500 mg by mouth at bedtime.  Marland Kitchen olmesartan (BENICAR) 20 MG tablet Take 20 mg by mouth daily.  Marland Kitchen omeprazole (PRILOSEC) 40 MG capsule Take 40 mg by mouth daily.  . simvastatin (ZOCOR) 10 MG tablet Take 10 mg by mouth daily.  . tamsulosin (FLOMAX) 0.4 MG CAPS capsule Take 0.4 mg by mouth.  . vitamin C (ASCORBIC ACID) 500 MG tablet Take 500 mg by mouth daily.  . Carbidopa-Levodopa ER (RYTARY) 23.75-95 MG CPCR Take 95 mg by mouth 3 (three) times daily.  . [DISCONTINUED] amantadine (SYMMETREL) 100 MG capsule Take 100 mg by mouth 2 (two) times daily.  . [DISCONTINUED] Melatonin 3 MG TABS Take by mouth.  . [DISCONTINUED] rasagiline (AZILECT) 1 MG TABS tablet Take 1 mg by mouth daily.   No facility-administered encounter medications on file as of 02/21/2015.  :   Review of Systems:  Out of a complete 14 point review of systems, all are reviewed and negative with the exception of these symptoms as listed below:   Review of Systems  Constitutional: Positive for fatigue.  HENT: Positive for hearing loss.   Eyes:       Double vision   Cardiovascular:  Positive for leg swelling.  Musculoskeletal:       Joint pain   Neurological: Positive for tremors.       Memory loss, Tremors on left side of body  Hematological: Bruises/bleeds easily.    Objective:  Neurologic Exam  Physical Exam Physical Examination:   Filed Vitals:   02/21/15 0942  BP: 106/73  Pulse: 96  Resp: 16    General Examination: The patient is a very pleasant 81 y.o. male in no acute distress.  HEENT: Normocephalic, atraumatic, pupils are equal, round and reactive to light and accommodation. Funduscopic exam is normal with sharp disc margins noted. Extraocular tracking shows mild saccadic breakdown without nystagmus noted. There is limitation to upper gaze. There is mild decrease in  eye blink rate. Hearing is impaired mildly with b/l hearing aids in place. Face is symmetric with minimal facial masking and normal facial sensation. There is no lip, neck or jaw tremor. Neck is mildly to moderately rigid with intact passive ROM. There are no carotid bruits on auscultation. Oropharynx exam reveals mild mouth dryness. No significant airway crowding is noted. Mallampati is class II. Tongue protrudes centrally and palate elevates symmetrically.   There is no drooling.   Chest: is clear to auscultation without wheezing, rhonchi or crackles noted.  Heart: sounds are regular and normal without murmurs, rubs or gallops noted.   Abdomen: is soft, non-tender and non-distended with normal bowel sounds appreciated on auscultation.  Extremities: There is 2+ pitting edema in the distal lower extremities bilaterally. Pedal pulses are intact. Chronic stasis-like changes are noted in the distal legs bilaterally. There are no varicose veins.  Skin: is warm and dry with no trophic changes noted. Age-related changes are noted on the skin.   Musculoskeletal: exam reveals no obvious joint deformities, tenderness, joint swelling or erythema.  Neurologically:  Mental status: The patient is  awake and alert, paying good  attention. He is can provide the history. He is oriented to: person, place, time/date, situation, day of week, month of year and year. His memory, attention, language and knowledge are intact. There is no aphasia, agnosia, apraxia or anomia. There is a minimal degree of bradyphrenia. Speech is minimally hypophonic with no dysarthria noted. Mood is congruent and affect is normal.   Cranial nerves are as described above under HEENT exam. In addition, shoulder shrug is normal with equal shoulder height noted.  Motor exam: Normal bulk, and strength for age is noted. There are no dyskinesias noted.  Tone is mildly rigid with presence of cogwheeling in the left upper extremity. There is overall mild bradykinesia. There is no drift or rebound.  There is a mild resting tremor in the left upper extremity and a mild resting tremor in the left lower extremity. The tremor is constant.  Romberg is negative.  Reflexes are 1+ in the upper extremities and trace in the lower extremities. Toes are downgoing bilaterally.  Fine motor skills exam: Finger taps are Minimally impaired on the right and mildly impaired on the left. Hand movements are Not impaired on the right and mildly impaired on the left. RAP (rapid alternating patting) is Not impaired on the right and mildly impaired on the left. Foot taps are Not to minimally impaired on the right and mildly impaired on the left. Foot agility (in the form of heel stomping) is Not impaired on the right and mildly impaired on the left.    Cerebellar testing shows no dysmetria or intention tremor on finger to nose testing. Heel to shin is unremarkable bilaterally. There is no truncal or gait ataxia.   Sensory exam is intact in the upper and lower extremities.   Gait, station and balance: He stands up from the seated position with mild difficulty and does need to push up with His hands. He needs no assistance. No veering to one side is noted. He  is not noted to lean to the side. Posture is mildly stooped for age, but could be appropriate for age. Stance is wide-based and slightly. He walks with decrease in stride length and pace and decreased arm swing on the left. He turns in 3 steps. Tandem walk is not possible. Balance is mildly impaired.   Assessment and Plan:   In summary, Mauri Pole  is a very pleasant 81 y.o.-year old male with an underlying medical history of degenerative spine disease, allergies, BPH, hypertension, arthritis, reflux disease, and insomnia, who has an approximately three-year history of left-sided tremor, fine motor dyscontrol, and gait difficulty. Symptoms have been mildly progressive and are bothersome to him. His history and physical exam are in keeping with parkinsonism, possibly left-sided predominant, tremor predominant Parkinson's disease. He has been tried on multiple different medications but had unfortunately side effects and intolerances. Most of his intolerances secondary to GI related issues including stomach pain, nausea and vomiting. Unfortunately, at this point, there is not a whole lot of medication options left to try. Nevertheless, I suggested a cautious trial of Rytary, starting at the lowest dose, namely 95 mg strength one pill 3 times a day with very gradual buildup if possible. I talked to him about the medication schedule, how to take the medication, and potential side effects. I gave him a new prescription and written instructions. For help with diagnostic clarification I suggested we pursue a DaT scan. I explained that scan to him and also explained to him that this is not a diagnostic tests but could be a helpful diagnostic tool. He is agreeable. He would prefer to go to [facility] for this. I made a referral in that regard. We will call him with the test results. In the interim, I have asked him to give [place] an update I phone or email in the next 10-14 days as to how he's doing on new  medication, at which point we can consider gradually increasing it.  I had a long chat with the patient about His symptoms, my findings and the diagnosis of parkinsonism/Parksinson's disease, its prognosis and treatment options. We talked about medical treatments and non-pharmacological approaches. We talked about maintaining a healthy lifestyle in general. I encouraged the patient to eat healthy, exercise daily and keep well hydrated, to keep a scheduled bedtime and wake time routine, to not skip any meals and eat healthy snacks in between meals and to have protein with every meal. In particular, I stressed the importance of regular exercise, within of course the patient's own mobility limitations.   I would like to see him back in about 3 months, sooner if needed. In the interim, he will be in touch with [place] over the phone or email. Thank you very much for allowing me to participate in the care of this nice patient. If I can be of any further assistance to you please do not hesitate to call me at [phone].  Sincerely,   Star Age, MD, PhD

## 2015-03-15 ENCOUNTER — Encounter: Payer: Self-pay | Admitting: Neurology

## 2015-03-17 ENCOUNTER — Telehealth: Payer: Self-pay | Admitting: Neurology

## 2015-03-17 DIAGNOSIS — G20 Parkinson's disease: Secondary | ICD-10-CM

## 2015-03-17 MED ORDER — CARBIDOPA-LEVODOPA ER 23.75-95 MG PO CPCR: 190.0000 mg | ORAL_CAPSULE | Freq: Three times a day (TID) | ORAL | Status: DC

## 2015-03-17 NOTE — Telephone Encounter (Signed)
I called the patient.  Got no answer.  Left message relaying providers note.  Asked that he either call or MyChart message [place] with any questions.

## 2015-03-17 NOTE — Telephone Encounter (Signed)
Pls call patient and advise him, that I saw his email update: let's go up on Rytary 95 mg: I would like for him to increase it to 2 pills 3 times a day. He is currently on 1 pill 3 times a day and has been tolerating it. We may be able to go up to 3 pills 3 times a day after this. Please ask him to email me back in 2-3 weeks if possible, for an update.

## 2015-03-19 DIAGNOSIS — G20 Parkinson's disease: Secondary | ICD-10-CM | POA: Diagnosis not present

## 2015-04-02 DIAGNOSIS — R600 Localized edema: Secondary | ICD-10-CM | POA: Diagnosis not present

## 2015-04-07 DIAGNOSIS — Z23 Encounter for immunization: Secondary | ICD-10-CM | POA: Diagnosis not present

## 2015-04-10 ENCOUNTER — Encounter: Payer: Self-pay | Admitting: Neurology

## 2015-04-15 ENCOUNTER — Telehealth: Payer: Self-pay | Admitting: Neurology

## 2015-04-15 DIAGNOSIS — G20 Parkinson's disease: Secondary | ICD-10-CM

## 2015-04-15 MED ORDER — CARBIDOPA-LEVODOPA ER 48.75-195 MG PO CPCR: 195.0000 mg | ORAL_CAPSULE | Freq: Three times a day (TID) | ORAL | Status: DC

## 2015-04-15 NOTE — Telephone Encounter (Signed)
Pls call patient: I do thank him for the email update. Please advise him, that it may take several times of tweaking the dose, until we find the right regimen with the Rytary. Unfortunately, this medication is indeed quite expensive. I would like to change him to a different dose, 195 mg strength, 1 pill 3 times a day for his next refill. I have adjusted his prescription in that regard. Please advise him to start taking the new prescription when he is up for a refill. Please ask him to keep me posted via email. We may go up to 1 pill 4 times a day next.

## 2015-04-16 NOTE — Telephone Encounter (Signed)
I spoke to Adam Moss and he is aware of the new medication dose sent in. He will pick up at store today. He voiced understanding and will see [place] next month and keep [place] posted via email.

## 2015-05-15 DIAGNOSIS — I10 Essential (primary) hypertension: Secondary | ICD-10-CM | POA: Diagnosis not present

## 2015-05-15 DIAGNOSIS — K5790 Diverticulosis of intestine, part unspecified, without perforation or abscess without bleeding: Secondary | ICD-10-CM | POA: Diagnosis not present

## 2015-05-15 DIAGNOSIS — G20 Parkinson's disease: Secondary | ICD-10-CM | POA: Diagnosis not present

## 2015-05-15 DIAGNOSIS — E782 Mixed hyperlipidemia: Secondary | ICD-10-CM | POA: Diagnosis not present

## 2015-05-15 DIAGNOSIS — K219 Gastro-esophageal reflux disease without esophagitis: Secondary | ICD-10-CM | POA: Diagnosis not present

## 2015-05-26 ENCOUNTER — Ambulatory Visit (INDEPENDENT_AMBULATORY_CARE_PROVIDER_SITE_OTHER): Payer: Medicare Other | Admitting: Neurology

## 2015-05-26 ENCOUNTER — Encounter: Payer: Self-pay | Admitting: Neurology

## 2015-05-26 VITALS — BP 118/82 | HR 78 | Resp 18 | Ht 70.5 in | Wt 200.0 lb

## 2015-05-26 DIAGNOSIS — G20 Parkinson's disease: Secondary | ICD-10-CM

## 2015-05-26 DIAGNOSIS — G20A1 Parkinson's disease without dyskinesia, without mention of fluctuations: Secondary | ICD-10-CM

## 2015-05-26 MED ORDER — CARBIDOPA-LEVODOPA ER 48.75-195 MG PO CPCR: 195.0000 mg | ORAL_CAPSULE | Freq: Four times a day (QID) | ORAL | Status: DC

## 2015-05-26 NOTE — Progress Notes (Signed)
Subjective:    Patient ID: Adam Moss is a 81 y.o. male.  HPI     Interim history:   Adam Moss is a very pleasant 81 year old left -handed gentleman with an underlying medical history of degenerative spine disease, allergies, BPH, hypertension, arthritis, reflux disease, and insomnia, who presents for follow-up consultation of his Parkinson's disease. The patient is unaccompanied today. I first met him on 02/21/2015 at the request of his primary neurologist, at which time the patient reported a history of left-sided tremor, fine motor dyscontrol, and gait difficulties. Symptoms dated back to 2-3 years prior. He had multiple medication intolerances particularly issues with GI side effects. I suggested a trial of Rytary, 95 mg strength with titration to 2 pills 3 times a day. I also suggested we proceed with a DaT scan and I referred him to [facility] nuclear medicine. He had the study on 03/19/2015 which showed abnormal image grade 1: Asymmetric uptake with normal or almost normal putamen activity in one hemisphere and with a more marked reduction in the contralateral putamen. This pattern of activity can be seen with Parkinson's disease or related syndromes.   Of note, the radiotracer uptake was decreased on the right. We called the patient with the test results. The patient emailed me in August reporting a slight decrease in his tremor with a new medication and ability to tolerated thus far. He asked for change and pill strength of possible because the medication is very expensive. I changed him to 195 mg strength one pill 3 times a day.   Today, 05/26/2015: He reports doing somewhat better with the Rytary, tremor and fine motor skills better. He had recent blood work through his primary care physician which I reviewed: Lipid profile was unremarkable with the exception of triglycerides borderline at 155, BMP was normal and CBC was normal. Swelling of his legs is a  little better. He avoids night time driving. He visits his wife in memory care daily. He has been able to tolerate the new medication which is really reassuring. He takes Rytary 195 mg, one pill at 9 AM, 1 pill at 5 PM, 1 pill at bedtime which is usually around midnight. He has gained about 10 pounds and is trying to lose some. He worries about his weight gain but does admit that he has good food and lots of choices for dessert at his assisted living place.  Previously:   02/21/2015: He has a history of left-sided tremor, fine motor dyscontrol, and gait difficulty. 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 Symptoms date back to 2014 or the year before, when he started having difficulty getting out of a chair, tremors on the left side, slowness, and he started seeing you at the neuroscience center in [facility]. He was diagnosed with Parkinson's disease and tried on different medications but had side effects, primarily GI related side effects. He has a history of cholecystectomy. Sinemet caused severe abdominal pain and nausea. He was tried on Neupro which also caused stomach pain, and on Azilect low-dose he did not think he had any response. He has been on amantadine. His previous records were reviewed: This includes neurologic office notes from 01/10/2015, 11/08/2014, 09/09/2014, 08/19/2014, 05/20/2014, 02/18/2014, 11/30/2013, 07/06/2013, 06/01/2013, and 02/23/2013. He had a brain MRI years ago but results are not available for my review. He is currently on amantadine  100 mg twice daily. In 2015 he was tried on pramipexole but had severe GI upset. In July 2015 he reported that he had improvement with Neupro but stomach pain. In January 2016 he tried Sinemet but had to stop after a few days only. Earlier this year his Azilect was discontinued. He has no family history of Parkinson's disease. He  reports mild memory loss and mild sleep issues. He currently resides in independent living at [facility]. This is in [place]. His wife is a Marine scientist care. He drives. He has no recent history of falls. He tries to stay active physically. He has right knee pain and wears a soft brace around his right knee. He has a remote history of injury to his right knee. He has been off of amantadine for the past 2 weeks or so. He had significant blurry vision while on it to the point where he had to see an ophthalmologist. His blurry vision has improved since he stopped the amantadine. He feels that his symptoms have been slowly progressive. He is primarily bothered by his fine motor dyscontrol and tremor. He drives without significant problems but does not drive long distances.  His Past Medical History Is Significant For: Past Medical History  Diagnosis Date  . Tremor   . Spinal stenosis   . Parkinson disease ([place])   . Hypertension   . Cancer ([place])     skin  . Hyperlipemia   . Parkinsonism ([place]) 02/21/2015    His Past Surgical History Is Significant For: Past Surgical History  Procedure Laterality Date  . Appendectomy    . Tonsillectomy    . Vasectomy    . Cholecystectomy    . Cataract extraction      His Family History Is Significant For: Family History  Problem Relation Age of Onset  . Cancer Mother   . Heart disease Father     His Social History Is Significant For: Social History   Social History  . Marital Status: Married    Spouse Name: N/A  . Number of Children: 3  . Years of Education: PhD   Occupational History  . Retired    Social History Main Topics  . Smoking status: Never Smoker   . Smokeless tobacco: None  . Alcohol Use: No  . Drug Use: No  . Sexual Activity: Not Asked   Other Topics Concern  . None   Social History Narrative   Denies caffeine use     His Allergies Are:  Allergies  Allergen Reactions  . Carbidopa-Levodopa Other (See  Comments)    Severe stomach pains  . Iodinated Diagnostic Agents     Never used  . Tylenol [Acetaminophen]   . Pramipexole Nausea Only  :   His Current Medications Are:  Outpatient Encounter Prescriptions as of 05/26/2015  Medication Sig  . aspirin 81 MG tablet Take 81 mg by mouth daily.  . Calcium Citrate 250 MG TABS Take by mouth.  . Carbidopa-Levodopa ER (RYTARY) 48.75-195 MG CPCR Take 195 mg by mouth 3 (three) times daily.  . cetirizine (ZYRTEC) 10 MG tablet Take 10 mg by mouth daily.  . finasteride (PROSCAR) 5 MG tablet Take 5 mg by mouth daily.  . meloxicam (MOBIC) 15 MG tablet Take 15 mg by mouth daily.  . niacin 500 MG tablet Take 500 mg by mouth at bedtime.  Marland Kitchen olmesartan (BENICAR) 20 MG tablet Take 20 mg by mouth daily.  Marland Kitchen omeprazole (PRILOSEC) 40 MG capsule Take 40  mg by mouth daily.  . Simethicone (GAS RELIEF PO) Take by mouth.  . simvastatin (ZOCOR) 10 MG tablet Take 10 mg by mouth daily.  . tamsulosin (FLOMAX) 0.4 MG CAPS capsule Take 0.4 mg by mouth.  . vitamin C (ASCORBIC ACID) 500 MG tablet Take 500 mg by mouth daily.  . [DISCONTINUED] folic acid (FOLVITE) 1 MG tablet Take 1 mg by mouth daily.  . [DISCONTINUED] furosemide (LASIX) 20 MG tablet Take 20 mg by mouth.  . [DISCONTINUED] Multiple Vitamin (MULTIVITAMIN) capsule Take 1 capsule by mouth daily.   No facility-administered encounter medications on file as of 05/26/2015.  :  Review of Systems:  Out of a complete 14 point review of systems, all are reviewed and negative with the exception of these symptoms as listed below:   Review of Systems  Neurological:       Patient states that Rytary is helping him some. Feels like he shakes more when relaxed. No side effects reported.     Objective:  Neurologic Exam  Physical Exam Physical Examination:   Filed Vitals:   05/26/15 1129  BP: 118/82  Pulse: 78  Resp: 18    General Examination: The patient is a very pleasant 81 y.o. male in no acute distress. He  is in good spirits today.  HEENT: Normocephalic, atraumatic, pupils are equal, round and reactive to light and accommodation. Extraocular tracking shows mild saccadic breakdown without nystagmus noted. There is limitation to upper gaze. There is mild decrease in eye blink rate. Hearing is impaired mildly with b/l hearing aids in place. Face is symmetric with minimal facial masking and normal facial sensation. There is no lip, neck or jaw tremor. Neck is mildly to moderately rigid with intact passive ROM. There are no carotid bruits on auscultation. Oropharynx exam reveals mild mouth dryness. No significant airway crowding is noted. Mallampati is class II. Tongue protrudes centrally and palate elevates symmetrically.   There is no drooling.   Chest: is clear to auscultation without wheezing, rhonchi or crackles noted.  Heart: sounds are regular and normal without murmurs, rubs or gallops noted.   Abdomen: is soft, non-tender and non-distended with normal bowel sounds appreciated on auscultation.  Extremities: There is 1+ pitting edema in the distal lower extremities bilaterally. Pedal pulses are intact. Chronic stasis-like changes are noted in the distal legs bilaterally. There are no varicose veins.  Skin: is warm and dry with no trophic changes noted. Age-related changes are noted on the skin.   Musculoskeletal: exam reveals no obvious joint deformities, tenderness, joint swelling or erythema.  Neurologically:  Mental status: The patient is awake and alert, paying good  attention. He is can provide the history. He is oriented to: person, place, time/date, situation, day of week, month of year and year. His memory, attention, language and knowledge are intact. There is no aphasia, agnosia, apraxia or anomia. There is a minimal degree of bradyphrenia. Speech is minimally hypophonic with no dysarthria noted. Mood is congruent and affect is normal.   Cranial nerves are as described above under HEENT  exam. In addition, shoulder shrug is normal with equal shoulder height noted.  Motor exam: Normal bulk, and strength for age is noted. There are no dyskinesias noted.  Tone is mildly rigid with presence of cogwheeling in the left upper extremity. There is overall mild bradykinesia. There is no drift or rebound.  There is a mild resting tremor in the left upper extremity and a mild resting tremor in the left lower  extremity. The tremor is constant.  Romberg is negative.  Reflexes are 1+ in the upper extremities and trace in the lower extremities.   Fine motor skills exam: Finger taps are Minimally impaired on the right and mildly impaired on the left. Hand movements are Not impaired on the right and mildly impaired on the left. RAP (rapid alternating patting) is Not impaired on the right and mildly impaired on the left. Foot taps are Not to minimally impaired on the right and mildly impaired on the left. Foot agility (in the form of heel stomping) is Not impaired on the right and mildly impaired on the left.    Cerebellar testing shows no dysmetria or intention tremor on finger to nose testing. Heel to shin is unremarkable bilaterally. There is no truncal or gait ataxia.   Sensory exam is intact in the upper and lower extremities.   Gait, station and balance: He stands up from the seated position with mild difficulty and does need to push up with His hands. He needs no assistance. No veering to one side is noted. He is not noted to lean to the side. Posture is mildly stooped for age, but could be appropriate for age. Stance is wide-based and slightly. He walks with decrease in stride length and pace and decreased arm swing on the left. He turns in 3 steps. Tandem walk is not possible. Balance is mildly impaired.   Assessment and Plan:   In summary, Adam Moss is a very pleasant 81 year old male with an underlying medical history of degenerative spine disease, allergies, BPH, hypertension,  arthritis, reflux disease, and insomnia, who has an approximately three-year history of left-sided tremor, fine motor dyscontrol, and gait difficulty. Symptoms have been mildly progressive and are bothersome to him. His history and physical exam are in keeping with parkinsonism, most likely left-sided predominant, tremor predominant Parkinson's disease. He had a DaT scan in August 2016 indicative of decreased radiotracer uptake on the right. We talked about the results. He has been tried in the past on multiple different  Parkinson's medications but had unfortunately side effects and intolerances. Most of his intolerances were secondary to GI related issues including stomach pain,  acid reflux, nausea and vomiting.  He has been able to tolerate Rytary, which we started at 95 mg strength up to 2 pills tid, and I then switched him to 195 mg, 1 pill tid, currently. I suggested a cautious increase in Rytary to 195 mg 1 pill qid, at 9 AM, 1 PM, 5 PM and 10 PM. He is agreeable to trying this. I advised him to try to take it away from his mealtimes for better absorption. He is trying to stay active mentally and physically. He is advised to try to walk regularly and use his walking stick. Overall, physical exam is stable. We again talked about maintaining a healthy lifestyle in general. I encouraged the patient to eat healthy, exercise daily and keep well hydrated, to keep a scheduled bedtime and wake time routine, to not skip any meals and eat healthy snacks in between meals and to have protein with every meal. In particular, I stressed the importance of regular exercise, within of course the patient's own mobility limitations.  I would like to see him back in 4 months, sooner if needed. I answered all his questions today and adjusted his prescription for 90 day supply with refills. He is advised to call or email with any interim questions or concerns. He was in agreement with  the plan. I spent 25 minutes in total  face-to-face time with the patient, more than 50% of which was spent in counseling and coordination of care, reviewing test results, reviewing medication and discussing or reviewing the diagnosis of PD, its prognosis and treatment options.

## 2015-05-26 NOTE — Patient Instructions (Signed)
I think your Parkinson's disease has remained fairly stable, which is reassuring. Nevertheless, as you know, this disease does progress with time. It can affect your balance, your memory, your mood, your bowel and bladder function, your posture, balance and walking and your activities of daily living. However, there are good supportive treatments and symptomatic treatments available, so most patients have a change to a good quality life and life expectancy is not typically altered. Overall you are doing fairly well but I do want to suggest a few things today:  Remember to drink plenty of fluid at least 6 glasses (8 oz each), eat healthy meals and do not skip any meals. Try to eat protein with a every meal and eat a healthy snack such as fruit or nuts in between meals. Try to keep a regular sleep-wake schedule and try to exercise daily, particularly in the form of walking, 20 minutes a day, if you can. Use your walking stick.   Taking your medication on schedule is key.   Try to stay active physically and mentally. Engage in social activities in your community and with your family and try to keep up with current events by reading the newspaper or watching the news. Try to do word puzzles and you may like to do puzzles and brain games on the computer such as on [email or web address]   As far as your medications are concerned, I would like to suggest that you take your current medication with the following additional changes: Take Rytary 195 mg: 1 pills 4 times a day, at 9 AM, 1 PM, 5 PM and around 10 PM.   I would like to see you back in 4 months, sooner if we need to. Please call us with any interim questions, concerns, problems, updates or refill requests.  Our phone number is [phone]. We also have an after hours call service for urgent matters and there is a physician on-call for urgent questions, that cannot wait till the next work day. For any emergencies you know to call 911 or go to the nearest  emergency room.   You can email me through my chart and also leave a phone message for Beverlee Nims, my nurse.

## 2015-08-26 DIAGNOSIS — J9801 Acute bronchospasm: Secondary | ICD-10-CM | POA: Diagnosis not present

## 2015-08-26 DIAGNOSIS — J101 Influenza due to other identified influenza virus with other respiratory manifestations: Secondary | ICD-10-CM | POA: Diagnosis not present

## 2015-08-26 DIAGNOSIS — R0602 Shortness of breath: Secondary | ICD-10-CM | POA: Diagnosis not present

## 2015-09-24 ENCOUNTER — Ambulatory Visit (INDEPENDENT_AMBULATORY_CARE_PROVIDER_SITE_OTHER): Payer: Medicare Other | Admitting: Neurology

## 2015-09-24 ENCOUNTER — Encounter: Payer: Self-pay | Admitting: Neurology

## 2015-09-24 VITALS — BP 140/88 | HR 78 | Resp 22 | Ht 70.5 in | Wt 201.0 lb

## 2015-09-24 DIAGNOSIS — M25471 Effusion, right ankle: Secondary | ICD-10-CM | POA: Diagnosis not present

## 2015-09-24 DIAGNOSIS — G20A1 Parkinson's disease without dyskinesia, without mention of fluctuations: Secondary | ICD-10-CM

## 2015-09-24 DIAGNOSIS — G20 Parkinson's disease: Secondary | ICD-10-CM | POA: Diagnosis not present

## 2015-09-24 DIAGNOSIS — G479 Sleep disorder, unspecified: Secondary | ICD-10-CM | POA: Diagnosis not present

## 2015-09-24 DIAGNOSIS — Z789 Other specified health status: Secondary | ICD-10-CM

## 2015-09-24 DIAGNOSIS — M25472 Effusion, left ankle: Secondary | ICD-10-CM

## 2015-09-24 DIAGNOSIS — M25561 Pain in right knee: Secondary | ICD-10-CM | POA: Diagnosis not present

## 2015-09-24 NOTE — Progress Notes (Signed)
Subjective:    Patient ID: Adam Moss is a 82 y.o. male.  HPI     Interim history:   Adam Moss is a very pleasant 82 year old left-handed gentleman with an underlying medical history of degenerative spine disease, allergies, BPH, hypertension, arthritis, reflux disease, and insomnia, who presents for follow-up consultation of his Parkinson's disease. The patient is unaccompanied today. I last saw him on 05/26/2015, at which time he reported doing somewhat better with the new medication, Rytary, with improvement noted in fine motor skills and tremors. He had recent blood work through his primary care physician which I reviewed at the time: Lipid profile was unremarkable with the exception of triglycerides borderline at 155, BMP was normal and CBC was normal. He reported that his leg swelling was a little better. He was avoiding nighttime driving. He was visiting his wife and memory care regularly. He was able to tolerate the new medication which was really reassuring. She was taking Rytary 195 mg, one pill at 9 AM, 1 pill at 5 PM, 1 pill at bedtime which is usually around midnight. He had gained about 10 pounds and was trying to lose weight. He worried about his weight gain but did admit to having good food and lots of choices for dessert at his assisted living place. I suggested a gentle increase in his medication to 1 pill 4 times a day.  Today, 09/24/2015: He reports doing a little better, able to tolerate the Rytary qid.Thankfully, he has not had any falls. He does not always sleep very well at night. He used to be on melatonin in the past and would be willing to try it again. He sees his wife in memory care every day but she has had progressive memory loss. She has trouble with remote memory as well. They have 3 children who visit, about once a month. He tries to hydrate but does not always do a good enough job he admits. He has noticed more ankle swelling and has residual knee pain  bilaterally, right more than left. He has mobic and months. Sometimes he takes plain ibuprofen. He feels that his tremor and his handwriting and even fine motor skills are just a little bit better to the point where he can also pursue his hobby of building model ships.   Previously:   I first met him on 02/21/2015 at the request of his primary neurologist, at which time the patient reported a history of left-sided tremor, fine motor dyscontrol, and gait difficulties. Symptoms dated back to 2-3 years prior. He had multiple medication intolerances particularly issues with GI side effects. I suggested a trial of Rytary, 95 mg strength with titration to 2 pills 3 times a day. I also suggested we proceed with a DaT scan and I referred him to [facility] nuclear medicine. He had the study on 03/19/2015 which showed abnormal image grade 1: Asymmetric uptake with normal or almost normal putamen activity in one hemisphere and with a more marked reduction in the contralateral putamen. This pattern of activity can be seen with Parkinson's disease or related syndromes.   Of note, the radiotracer uptake was decreased on the right. We called the patient with the test results. The patient emailed me in August reporting a slight decrease in his tremor with a new medication and ability to tolerated thus far. He asked for change and pill strength of possible because the medication is very expensive. I changed him to 195 mg strength one pill  3 times a day.   02/21/2015: He has a history of left-sided tremor, fine motor dyscontrol, and gait difficulty. 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 Symptoms date back to 2014 or the year before, when he started having difficulty getting out of a chair, tremors on the left side, slowness, and he started seeing you at the neuroscience center in [facility]. He was diagnosed with Parkinson's disease and tried on different medications but had side effects, primarily GI related side effects. He has a history of cholecystectomy. Sinemet caused severe abdominal pain and nausea. He was tried on Neupro which also caused stomach pain, and on Azilect low-dose he did not think he had any response. He has been on amantadine. His previous records were reviewed: This includes neurologic office notes from 01/10/2015, 11/08/2014, 09/09/2014, 08/19/2014, 05/20/2014, 02/18/2014, 11/30/2013, 07/06/2013, 06/01/2013, and 02/23/2013. He had a brain MRI years ago but results are not available for my review. He is currently on amantadine 100 mg twice daily. In 2015 he was tried on pramipexole but had severe GI upset. In July 2015 he reported that he had improvement with Neupro but stomach pain. In January 2016 he tried Sinemet but had to stop after a few days only. Earlier this year his Azilect was discontinued. He has no family history of Parkinson's disease. He reports mild memory loss and mild sleep issues. He currently resides in independent living at the [facility]. This is in [place]. His wife is a Marine scientist care. He drives. He has no recent history of falls. He tries to stay active physically. He has right knee pain and wears a soft brace around his right knee. He has a remote history of injury to his right knee. He has been off of amantadine for the past 2 weeks or so. He had significant blurry vision while on it to the point where he had to see an ophthalmologist. His blurry vision has improved since he stopped the amantadine. He feels that his symptoms have been slowly progressive. He is primarily bothered by his fine motor dyscontrol and tremor. He drives without significant problems but does not drive long distances.  His Past Medical History Is Significant For: Past Medical History  Diagnosis Date  . Tremor   . Spinal stenosis   . Parkinson disease ([place])    . Hypertension   . Cancer ([place])     skin  . Hyperlipemia   . Parkinsonism ([place]) 02/21/2015    His Past Surgical History Is Significant For: Past Surgical History  Procedure Laterality Date  . Appendectomy    . Tonsillectomy    . Vasectomy    . Cholecystectomy    . Cataract extraction      His Family History Is Significant For: Family History  Problem Relation Age of Onset  . Cancer Mother   . Heart disease Father     His Social History Is Significant For: Social History   Social History  . Marital Status: Married    Spouse Name: N/A  . Number of Children: 3  . Years of Education: PhD   Occupational History  . Retired    Social History Main Topics  . Smoking status: Never Smoker   . Smokeless tobacco: None  . Alcohol Use: No  . Drug Use: No  . Sexual Activity: Not Asked   Other Topics Concern  .  None   Social History Narrative   Denies caffeine use     His Allergies Are:  Allergies  Allergen Reactions  . Carbidopa-Levodopa Other (See Comments)    Severe stomach pains  . Iodinated Diagnostic Agents     Never used  . Tylenol [Acetaminophen]   . Pramipexole Nausea Only  :   His Current Medications Are:  Outpatient Encounter Prescriptions as of 09/24/2015  Medication Sig  . aspirin 81 MG tablet Take 81 mg by mouth daily.  . calcium carbonate (TUMS - DOSED IN MG ELEMENTAL CALCIUM) 500 MG chewable tablet Chew 1 tablet by mouth daily.  . Calcium Citrate 250 MG TABS Take by mouth.  . Carbidopa-Levodopa ER (RYTARY) 48.75-195 MG CPCR Take 195 mg by mouth 4 (four) times daily.  . cetirizine (ZYRTEC) 10 MG tablet Take 10 mg by mouth daily.  . finasteride (PROSCAR) 5 MG tablet Take 5 mg by mouth daily.  . niacin 500 MG tablet Take 500 mg by mouth at bedtime.  Marland Kitchen olmesartan (BENICAR) 20 MG tablet Take 20 mg by mouth daily.  Marland Kitchen omeprazole (PRILOSEC) 40 MG capsule Take 40 mg by mouth daily.  . Simethicone (GAS RELIEF PO) Take by mouth.  . simvastatin (ZOCOR) 10  MG tablet Take 10 mg by mouth daily.  . tamsulosin (FLOMAX) 0.4 MG CAPS capsule Take 0.4 mg by mouth.  . vitamin C (ASCORBIC ACID) 500 MG tablet Take 500 mg by mouth daily.  . [DISCONTINUED] meloxicam (MOBIC) 15 MG tablet Take 15 mg by mouth daily.   No facility-administered encounter medications on file as of 09/24/2015.  :  Review of Systems:  Out of a complete 14 point review of systems, all are reviewed and negative with the exception of these symptoms as listed below:   Review of Systems  Neurological:       Per patient, no new concerns. He feels like his medications are helping him.     Objective:  Neurologic Exam  Physical Exam Physical Examination:   Filed Vitals:   09/24/15 1021  BP: 140/88  Pulse: 78  Resp: 22    General Examination: The patient is a very pleasant 82 y.o. male in no acute distress. He is in good spirits today.  HEENT: Normocephalic, atraumatic, pupils are equal, round and reactive to light and accommodation. Extraocular tracking shows mild saccadic breakdown without nystagmus noted. He has corrective eyeglasses. He is status post left cataract repair. There is limitation to upper gaze. There is mild decrease in eye blink rate. Hearing is impaired mildly with b/l hearing aids in place. Face is symmetric with minimal facial masking and normal facial sensation. There is no lip, neck or jaw tremor. Neck is mildly to moderately rigid with intact passive ROM. There are no carotid bruits on auscultation. Oropharynx exam reveals mild mouth dryness. No significant airway crowding is noted. Mallampati is class II. Tongue protrudes centrally and palate elevates symmetrically.   There is no drooling.   Chest: is clear to auscultation without wheezing, rhonchi or crackles noted.  Heart: sounds are regular and normal without murmurs, rubs or gallops noted.   Abdomen: is soft, non-tender and non-distended with normal bowel sounds appreciated on  auscultation.  Extremities: There is 1+ pitting edema in the distal lower extremities bilaterally, right more pronounced, mainly around the ankles bilaterally. Pedal pulses are intact. Chronic stasis-like changes are noted in the distal legs bilaterally. There are no varicose veins.  Skin: is warm and dry with no trophic changes  noted. Age-related changes are noted on the skin.   Musculoskeletal: exam reveals no obvious joint deformities, tenderness, joint swelling or erythema.  Neurologically:  Mental status: The patient is awake and alert, paying good  attention. He is can provide the history. He is oriented to: person, place, time/date, situation, day of week, month of year and year. His memory, attention, language and knowledge are intact. There is no aphasia, agnosia, apraxia or anomia. There is a minimal degree of bradyphrenia. Speech is minimally hypophonic with no dysarthria noted. Mood is congruent and affect is normal.   Cranial nerves are as described above under HEENT exam. In addition, shoulder shrug is normal with equal shoulder height noted.  Motor exam: Normal bulk, and strength for age is noted. There are no dyskinesias noted.  Tone is mildly rigid with presence of cogwheeling in the left upper extremity. There is overall mild bradykinesia. There is no drift or rebound.  There is a mild resting tremor in the left upper extremity and a mild resting tremor in the left lower extremity. The tremor is constant.  Romberg is negative.  Reflexes are 1+ in the upper extremities and trace in the lower extremities.   Fine motor skills exam: Finger taps are Minimally impaired on the right and mildly impaired on the left. Hand movements are Not impaired on the right and mildly impaired on the left. RAP (rapid alternating patting) is minimally impaired on the right and mildly impaired on the left. Foot taps are minimally impaired on the right and mildly impaired on the left. Foot agility (in the  form of heel stomping) is Not impaired on the right and mildly impaired on the left.    Cerebellar testing shows no dysmetria or intention tremor on finger to nose testing. Heel to shin is unremarkable bilaterally. There is no truncal or gait ataxia.   Sensory exam is intact in the upper and lower extremities.   Gait, station and balance: He stands up from the seated position with mild difficulty and pushes up with His hands. He needs no assistance. No veering to one side is noted. He is not noted to lean to the side. Posture is mildly stooped for age, but could be appropriate for age. Stance is wide-based and slightly. He walks with decrease in stride length and pace and decreased arm swing on the left. He turns in 3 steps. Tandem walk is not possible. Balance is mildly impaired.   Assessment and Plan:   In summary, Adam Moss is a very pleasant 82 year old male with an underlying medical history of degenerative spine disease, allergies, BPH, hypertension, arthritis, reflux disease, and insomnia, who has an approximately three-year history of left-sided tremor, fine motor dyscontrol, and gait difficulty. Symptoms have been mildly progressive and are bothersome to him. His history and physical exam are in keeping with parkinsonism, most likely left-sided predominant, tremor predominant Parkinson's disease. He had a DaT scan in August 2016 indicative of decreased radiotracer uptake on the right. We talked about the results in past visits. He had been tried in the past on multiple different  Parkinson's medications but had unfortunately side effects and intolerances. Most of his intolerances were secondary to GI related issues including stomach pain,  acid reflux, nausea and vomiting.  He has been able to tolerate Rytary, which we started at 95 mg strength up to 2 pills tid, and I then switched him to 195 mg, 1 pill tid, now qid, around 9 AM, 1 PM, 5 PM  and 10 PM. He has done quite well with this. He  noticed improvement in his tremor, fine motor skills as well and most importantly he has been able to tolerate this rather well. The one downside is the expense of the medication. Nevertheless, he feels he is justified to continue with the medication. I asked him to continue at the current dose and timing. For sleep, he is encouraged to try melatonin again, 3-5 mg, 1-2 hours before bedtime. He has been very diligent in seeing his wife every afternoon around 2:30 PM. He reports that their conversations have been less and less and mostly one-sided and he has a tendency to doze off when he visits her.  He has a checkup with his primary care physician soon. He is advised to address his ankle swelling and his knee pain at the time as well. Overall, physical exam is stable. We again talked about maintaining a healthy lifestyle in general. I encouraged the patient to eat healthy, exercise daily and keep well hydrated, to keep a scheduled bedtime and wake time routine, to not skip any meals and eat healthy snacks in between meals and to have protein with every meal. In particular, I stressed the importance of regular exercise, within of course the patient's own mobility limitations and he is encouraged to stay well-hydrated. I would like to see him back in 4 months, sooner if needed. He did not need a refill on his Rytary today. He is encouraged to call with any interim questions or concerns. I answered all his questions today and he was in agreement.  I spent 25 minutes in total face-to-face time with the patient, more than 50% of which was spent in counseling and coordination of care, reviewing test results, reviewing medication and discussing or reviewing the diagnosis of PD, its prognosis and treatment options.

## 2015-09-24 NOTE — Patient Instructions (Addendum)
We will continue with your current medications. We have room to increase it but at this point, I think you're exam is stable and you are tolerating the medication at the current dose. Try to stay active physically, and drink more water, don't drink milk with the Rytary (just water is better for medication absorption).    You can try Melatonin at night for sleep: take 3 to 5 mg one to 2 hours before your bedtime.

## 2015-09-29 ENCOUNTER — Ambulatory Visit (INDEPENDENT_AMBULATORY_CARE_PROVIDER_SITE_OTHER): Payer: Medicare Other

## 2015-09-29 ENCOUNTER — Other Ambulatory Visit: Payer: Self-pay | Admitting: Family Medicine

## 2015-09-29 DIAGNOSIS — R059 Cough, unspecified: Secondary | ICD-10-CM

## 2015-09-29 DIAGNOSIS — J9811 Atelectasis: Secondary | ICD-10-CM | POA: Diagnosis not present

## 2015-09-29 DIAGNOSIS — R05 Cough: Secondary | ICD-10-CM

## 2015-09-29 DIAGNOSIS — R062 Wheezing: Secondary | ICD-10-CM | POA: Diagnosis not present

## 2015-09-29 DIAGNOSIS — J9801 Acute bronchospasm: Secondary | ICD-10-CM | POA: Diagnosis not present

## 2015-09-29 DIAGNOSIS — J069 Acute upper respiratory infection, unspecified: Secondary | ICD-10-CM | POA: Diagnosis not present

## 2015-10-03 DIAGNOSIS — H04123 Dry eye syndrome of bilateral lacrimal glands: Secondary | ICD-10-CM | POA: Diagnosis not present

## 2015-10-03 DIAGNOSIS — H2511 Age-related nuclear cataract, right eye: Secondary | ICD-10-CM | POA: Diagnosis not present

## 2015-10-03 DIAGNOSIS — H35033 Hypertensive retinopathy, bilateral: Secondary | ICD-10-CM | POA: Diagnosis not present

## 2015-10-03 DIAGNOSIS — Z79899 Other long term (current) drug therapy: Secondary | ICD-10-CM | POA: Diagnosis not present

## 2015-10-20 DIAGNOSIS — R0602 Shortness of breath: Secondary | ICD-10-CM | POA: Diagnosis not present

## 2015-10-20 DIAGNOSIS — R062 Wheezing: Secondary | ICD-10-CM | POA: Diagnosis not present

## 2015-10-20 DIAGNOSIS — R05 Cough: Secondary | ICD-10-CM | POA: Diagnosis not present

## 2015-11-11 DIAGNOSIS — N401 Enlarged prostate with lower urinary tract symptoms: Secondary | ICD-10-CM | POA: Diagnosis not present

## 2015-11-11 DIAGNOSIS — Z0000 Encounter for general adult medical examination without abnormal findings: Secondary | ICD-10-CM | POA: Diagnosis not present

## 2015-11-11 DIAGNOSIS — K219 Gastro-esophageal reflux disease without esophagitis: Secondary | ICD-10-CM | POA: Diagnosis not present

## 2015-11-11 DIAGNOSIS — E782 Mixed hyperlipidemia: Secondary | ICD-10-CM | POA: Diagnosis not present

## 2015-11-11 DIAGNOSIS — I10 Essential (primary) hypertension: Secondary | ICD-10-CM | POA: Diagnosis not present

## 2015-11-14 DIAGNOSIS — Z1211 Encounter for screening for malignant neoplasm of colon: Secondary | ICD-10-CM | POA: Diagnosis not present

## 2015-11-14 DIAGNOSIS — Z79899 Other long term (current) drug therapy: Secondary | ICD-10-CM | POA: Diagnosis not present

## 2015-12-04 DIAGNOSIS — R05 Cough: Secondary | ICD-10-CM | POA: Insufficient documentation

## 2015-12-04 DIAGNOSIS — J3089 Other allergic rhinitis: Secondary | ICD-10-CM | POA: Diagnosis not present

## 2015-12-04 DIAGNOSIS — R059 Cough, unspecified: Secondary | ICD-10-CM | POA: Insufficient documentation

## 2015-12-24 DIAGNOSIS — C4441 Basal cell carcinoma of skin of scalp and neck: Secondary | ICD-10-CM | POA: Diagnosis not present

## 2015-12-24 DIAGNOSIS — X32XXXA Exposure to sunlight, initial encounter: Secondary | ICD-10-CM | POA: Diagnosis not present

## 2015-12-24 DIAGNOSIS — D0422 Carcinoma in situ of skin of left ear and external auricular canal: Secondary | ICD-10-CM | POA: Diagnosis not present

## 2015-12-24 DIAGNOSIS — L821 Other seborrheic keratosis: Secondary | ICD-10-CM | POA: Diagnosis not present

## 2015-12-24 DIAGNOSIS — Z08 Encounter for follow-up examination after completed treatment for malignant neoplasm: Secondary | ICD-10-CM | POA: Diagnosis not present

## 2015-12-24 DIAGNOSIS — L570 Actinic keratosis: Secondary | ICD-10-CM | POA: Diagnosis not present

## 2015-12-24 DIAGNOSIS — D485 Neoplasm of uncertain behavior of skin: Secondary | ICD-10-CM | POA: Diagnosis not present

## 2015-12-24 DIAGNOSIS — Z85828 Personal history of other malignant neoplasm of skin: Secondary | ICD-10-CM | POA: Diagnosis not present

## 2015-12-24 DIAGNOSIS — C4401 Basal cell carcinoma of skin of lip: Secondary | ICD-10-CM | POA: Diagnosis not present

## 2016-02-04 ENCOUNTER — Encounter: Payer: Self-pay | Admitting: Neurology

## 2016-02-04 ENCOUNTER — Ambulatory Visit (INDEPENDENT_AMBULATORY_CARE_PROVIDER_SITE_OTHER): Payer: Medicare Other | Admitting: Neurology

## 2016-02-04 VITALS — BP 122/86 | HR 88 | Resp 20 | Ht 70.5 in | Wt 205.0 lb

## 2016-02-04 DIAGNOSIS — G20 Parkinson's disease: Secondary | ICD-10-CM | POA: Diagnosis not present

## 2016-02-04 DIAGNOSIS — M25471 Effusion, right ankle: Secondary | ICD-10-CM

## 2016-02-04 DIAGNOSIS — M25472 Effusion, left ankle: Secondary | ICD-10-CM | POA: Diagnosis not present

## 2016-02-04 MED ORDER — CARBIDOPA-LEVODOPA ER 48.75-195 MG PO CPCR: 195.0000 mg | ORAL_CAPSULE | Freq: Four times a day (QID) | ORAL | Status: DC

## 2016-02-04 NOTE — Patient Instructions (Signed)
As mutually agreed, we will keep your Rytary at 195 mg strength, 4 times a day.  Please drink more water. Eat well and monitor weight.  Exercise at the gym/exercise room, take advantage of the equipment they have.

## 2016-02-04 NOTE — Progress Notes (Signed)
Subjective:    Patient ID: Adam Moss is a 82 y.o. male.  HPI     Interim history:   [name] is a very pleasant 82 year old left-handed gentleman with an underlying medical history of degenerative spine disease, allergies, BPH, hypertension, arthritis, reflux disease, and insomnia, who presents for follow-up consultation of his Parkinson's disease. The patient is unaccompanied today. I last saw him on 09/24/2015, at which time he reported doing a little better, able to tolerate the Rytary qid. Thankfully, h had no recent falls. He was not sleeping very well at night. He would visit his wife in memory care every day. His 3 children with visit about once a month. Is trying to hydrate well. He had noticed some ankle swelling and residual knee pain bilaterally. He was able to pursue his hobby of building model ships. I suggested he continue with Rytary at the current dose. He was encouraged to try melatonin for sleep.   Today, 02/04/2016: He reports doing okay, tolerating Rytary 195 mg qid, reports no side effects, sometimes is not motivated enough to exercise. Does not typically take advantage of the gym or exercise room they have on site. He likes to walk but has not been walking as much in the heat. He does visit his wife in the afternoon every day. He does not always drink enough water but tries. He has gained weight. No recent falls, no hallucinations, no new memory problems, occasional depressed mood but nothing sustained and nothing bothersome at this time.  Previously:   I saw him on 05/26/2015, at which time he reported doing somewhat better with the new medication, Rytary, with improvement noted in fine motor skills and tremors. He had recent blood work through his primary care physician which I reviewed at the time: Lipid profile was unremarkable with the exception of triglycerides borderline at 155, BMP was normal and CBC was normal. He reported that his leg swelling was a little  better. He was avoiding nighttime driving. He was visiting his wife and memory care regularly. He was able to tolerate the new medication which was really reassuring. She was taking Rytary 195 mg, one pill at 9 AM, 1 pill at 5 PM, 1 pill at bedtime which is usually around midnight. He had gained about 10 pounds and was trying to lose weight. He worried about his weight gain but did admit to having good food and lots of choices for dessert at his assisted living place. I suggested a gentle increase in his medication to 1 pill 4 times a day.  I first met him on 02/21/2015 at the request of his primary neurologist, at which time the patient reported a history of left-sided tremor, fine motor dyscontrol, and gait difficulties. Symptoms dated back to 2-3 years prior. He had multiple medication intolerances particularly issues with GI side effects. I suggested a trial of Rytary, 95 mg strength with titration to 2 pills 3 times a day. I also suggested we proceed with a DaT scan and I referred him to [facility] nuclear medicine. He had the study on 03/19/2015 which showed abnormal image grade 1: Asymmetric uptake with normal or almost normal putamen activity in one hemisphere and with a more marked reduction in the contralateral putamen. This pattern of activity can be seen with Parkinson's disease or related syndromes.   Of note, the radiotracer uptake was decreased on the right. We called the patient with the test results. The patient emailed me in August reporting  a slight decrease in his tremor with a new medication and ability to tolerated thus far. He asked for change and pill strength of possible because the medication is very expensive. I changed him to 195 mg strength one pill 3 times a day.   02/21/2015: He has a history of left-sided tremor, fine motor dyscontrol, and gait difficulty. 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 Symptoms date back to 2014 or the year before, when he started having difficulty getting out of a chair, tremors on the left side, slowness, and he started seeing you at the neuroscience center in [facility]. He was diagnosed with Parkinson's disease and tried on different medications but had side effects, primarily GI related side effects. He has a history of cholecystectomy. Sinemet caused severe abdominal pain and nausea. He was tried on Neupro which also caused stomach pain, and on Azilect low-dose he did not think he had any response. He has been on amantadine. His previous records were reviewed: This includes neurologic office notes from 01/10/2015, 11/08/2014, 09/09/2014, 08/19/2014, 05/20/2014, 02/18/2014, 11/30/2013, 07/06/2013, 06/01/2013, and 02/23/2013. He had a brain MRI years ago but results are not available for my review. He is currently on amantadine 100 mg twice daily. In 2015 he was tried on pramipexole but had severe GI upset. In July 2015 he reported that he had improvement with Neupro but stomach pain. In January 2016 he tried Sinemet but had to stop after a few days only. Earlier this year his Azilect was discontinued. He has no family history of Parkinson's disease. He reports mild memory loss and mild sleep issues. He currently resides in independent living at [facility] [place]. This is in [place]. His wife is a Marine scientist care. He drives. He has no recent history of falls. He tries to stay active physically. He has right knee pain and wears a soft brace around his right knee. He has a remote history of injury to his right knee. He has been off of amantadine for the past 2 weeks or so. He had significant blurry vision while on it to the point where he had to see an ophthalmologist. His blurry vision has improved since he stopped the amantadine. He feels that his symptoms have been slowly progressive. He is primarily bothered by his  fine motor dyscontrol and tremor. He drives without significant problems but does not drive long distances.  His Past Medical History Is Significant For: Past Medical History  Diagnosis Date  . Tremor   . Spinal stenosis   . Parkinson disease ([place])   . Hypertension   . Cancer ([place])     skin  . Hyperlipemia   . Parkinsonism ([place]) 02/21/2015    His Past Surgical History Is Significant For: Past Surgical History  Procedure Laterality Date  . Appendectomy    . Tonsillectomy    . Vasectomy    . Cholecystectomy    . Cataract extraction      His Family History Is Significant For: Family History  Problem Relation Age of Onset  . Cancer Mother   . Heart disease Father     His Social History Is Significant For: Social History   Social History  . Marital Status: Married    Spouse Name: N/A  . Number of Children: 3  . Years of Education: PhD   Occupational History  . Retired  Social History Main Topics  . Smoking status: Never Smoker   . Smokeless tobacco: None  . Alcohol Use: No  . Drug Use: No  . Sexual Activity: Not Asked   Other Topics Concern  . None   Social History Narrative   Denies caffeine use     His Allergies Are:  Allergies  Allergen Reactions  . Carbidopa-Levodopa Other (See Comments)    Severe stomach pains  . Oxycodone-Acetaminophen Other (See Comments)    "boils on skin"  . Tylenol [Acetaminophen]   . Pramipexole Nausea Only  :   His Current Medications Are:  Outpatient Encounter Prescriptions as of 02/04/2016  Medication Sig  . aspirin 81 MG tablet Take 81 mg by mouth daily.  . calcium carbonate (TUMS - DOSED IN MG ELEMENTAL CALCIUM) 500 MG chewable tablet Chew 1 tablet by mouth daily.  . Calcium Citrate 250 MG TABS Take by mouth.  . Carbidopa-Levodopa ER (RYTARY) 48.75-195 MG CPCR Take 195 mg by mouth 4 (four) times daily.  . cetirizine (ZYRTEC) 10 MG tablet Take 10 mg by mouth daily.  . finasteride (PROSCAR) 5 MG tablet Take 5 mg by  mouth daily.  . niacin 500 MG tablet Take 500 mg by mouth every morning.   . olmesartan (BENICAR) 20 MG tablet Take 20 mg by mouth daily.  Marland Kitchen omeprazole (PRILOSEC) 40 MG capsule Take 40 mg by mouth daily.  . Simethicone (GAS RELIEF PO) Take by mouth.  . simvastatin (ZOCOR) 10 MG tablet Take 10 mg by mouth daily.  . tamsulosin (FLOMAX) 0.4 MG CAPS capsule Take 0.4 mg by mouth.  . vitamin C (ASCORBIC ACID) 500 MG tablet Take 500 mg by mouth daily.   No facility-administered encounter medications on file as of 02/04/2016.  :  Review of Systems:  Out of a complete 14 point review of systems, all are reviewed and negative with the exception of these symptoms as listed below:   Review of Systems  Neurological:       Patient is here for f/u. No new concerns.     Objective:  Neurologic Exam  Physical Exam Physical Examination:   Filed Vitals:   02/04/16 1050  BP: 122/86  Pulse: 88  Resp: 20    General Examination: The patient is a very pleasant 82 y.o. male in no acute distress. He is in good spirits today.  HEENT: Normocephalic, atraumatic, pupils are equal, round and reactive to light and accommodation. Extraocular tracking shows mild saccadic breakdown without nystagmus noted. He has corrective eyeglasses. He is status post left cataract repair. There is limitation to upper gaze. There is mild decrease in eye blink rate. Hearing is impaired mildly with b/l hearing aids in place. Face is symmetric with minimal facial masking and normal facial sensation. There is no lip, neck or jaw tremor. Neck is mildly to moderately rigid with intact passive ROM. There are no carotid bruits on auscultation. Oropharynx exam reveals mild mouth dryness. No significant airway crowding is noted. Mallampati is class II. Tongue protrudes centrally and palate elevates symmetrically. There is no drooling.   Chest: is clear to auscultation without wheezing, rhonchi or crackles noted.  Heart: sounds are regular  and normal without murmurs, rubs or gallops noted.   Abdomen: is soft, non-tender and non-distended with normal bowel sounds appreciated on auscultation.  Extremities: There is 1+ pitting edema in the distal lower extremities bilaterally, right more pronounced, mainly around the ankles bilaterally. Pedal pulses are intact. Chronic stasis-like changes are noted  in the distal legs bilaterally. There are no varicose veins.  Skin: is warm and dry with no trophic changes noted, with the exception of a dry patch with mild redness underneath on his distal left shin. He says it has been there for at least 2 years. He also has some small skin lesions in his face, he has skin cancer surgery pending for a couple of basal cell and one squamous cell cancer lesion in his face. Age-related changes are noted on the skin.   Musculoskeletal: exam reveals no obvious joint deformities, tenderness, joint swelling or erythema.  Neurologically:  Mental status: The patient is awake and alert, paying good  attention. He is can provide the history. He is oriented to: person, place, time/date, situation, day of week, month of year and year. His memory, attention, language and knowledge are intact. There is no aphasia, agnosia, apraxia or anomia. There is a minimal degree of bradyphrenia. Speech is minimally hypophonic with no dysarthria noted. Mood is congruent and affect is normal.   Cranial nerves are as described above under HEENT exam. In addition, shoulder shrug is normal with equal shoulder height noted.  Motor exam: Normal bulk, and strength for age is noted. There are no dyskinesias noted.  Tone is mildly rigid with presence of cogwheeling in the left upper extremity. There is overall mild bradykinesia. There is no drift or rebound.  There is a mild resting tremor in the left upper extremity and a mild resting tremor in the left lower extremity. The tremor is constant.  Romberg is negative.  Reflexes are 1+ in the  upper extremities and trace in the lower extremities.   Fine motor skills exam: Finger taps , hand movements, and rapid alternating patting are mild to moderately impaired on the left and minimally to mildly on the right. Foot agility is mildly impaired on the left and minimally so on the right. He stands up with mild difficulty and pushes himself up. Posture is mildly stooped, could be age-appropriate as well.   Cerebellar testing shows no dysmetria or intention tremor on finger to nose testing. Heel to shin is unremarkable bilaterally. There is no truncal or gait ataxia.   Sensory exam is intact in the upper and lower extremities.   Gait, station and balance: He stands up from the seated position with mild difficulty and pushes up with His hands. He needs no assistance. No veering to one side is noted. He is not noted to lean to the side. Stance is slightly wide-based. He walks with decrease in stride length and pace and decreased arm swing on the left. He turns in 3 steps. Tandem walk is not possible. Balance is mildly impaired.   Assessment and Plan:   In summary, Machi Whittaker is a very pleasant 82 year old male with an underlying medical history of degenerative spine disease, allergies, BPH, hypertension, arthritis, reflux disease, and insomnia, who Presents for follow-up consultation of his left-sided predominant Parkinson's disease, has been able to tolerate Rytary at 195 mg 4 times a day. We mutually agreed to keep the dose and timing the same. He is encouraged to drink more water and exercise more, furthermore, he is advised to monitor his weight as he has gained about 14 pounds in the past year.  He had a DaT scan in August 2016 indicative of decreased radiotracer uptake on the right. We talked about the results in past visits. He had been tried in the past on multiple different  Parkinson's medications but  had unfortunately side effects and intolerances. Most of his intolerances were  secondary to GI related issues including stomach pain,  acid reflux, nausea and vomiting.  He has been able to tolerate Rytary, which we started at 95 mg strength up to 2 pills tid, and I then switched him to 195 mg, 1 pill tid, now qid, around 9 AM, 1 PM, 5 PM and 10 PM. He has done quite well with this. He noticed improvement in his tremor, fine motor skills as well and most importantly he has been able to tolerate this rather well. The one downside is the expense of the medication. Nevertheless, he feels he is justified to continue with the medication. He has been very diligent in seeing his wife every afternoon around 2:30 PM. He reports that their conversations have been less and less and mostly one-sided and he has a tendency to doze off when he visits her.  Overall, physical exam is stable. We again talked about maintaining a healthy lifestyle in general. I would like to see him back in 4 months, sooner if needed. He did not need a refill on his Rytary today. He is encouraged to call with any interim questions or concerns. I answered all his questions today and he was in agreement.  I spent 25 minutes in total face-to-face time with the patient, more than 50% of which was spent in counseling and coordination of care, reviewing test results, reviewing medication and discussing or reviewing the diagnosis of PD, its prognosis and treatment options.

## 2016-02-05 DIAGNOSIS — L905 Scar conditions and fibrosis of skin: Secondary | ICD-10-CM | POA: Diagnosis not present

## 2016-02-05 DIAGNOSIS — C4401 Basal cell carcinoma of skin of lip: Secondary | ICD-10-CM | POA: Diagnosis not present

## 2016-02-12 DIAGNOSIS — C4441 Basal cell carcinoma of skin of scalp and neck: Secondary | ICD-10-CM | POA: Diagnosis not present

## 2016-02-12 DIAGNOSIS — L905 Scar conditions and fibrosis of skin: Secondary | ICD-10-CM | POA: Diagnosis not present

## 2016-03-01 DIAGNOSIS — L905 Scar conditions and fibrosis of skin: Secondary | ICD-10-CM | POA: Diagnosis not present

## 2016-03-01 DIAGNOSIS — D0422 Carcinoma in situ of skin of left ear and external auricular canal: Secondary | ICD-10-CM | POA: Diagnosis not present

## 2016-05-05 ENCOUNTER — Encounter: Payer: Self-pay | Admitting: Neurology

## 2016-05-10 DIAGNOSIS — Z23 Encounter for immunization: Secondary | ICD-10-CM | POA: Diagnosis not present

## 2016-05-10 DIAGNOSIS — K219 Gastro-esophageal reflux disease without esophagitis: Secondary | ICD-10-CM | POA: Diagnosis not present

## 2016-05-10 DIAGNOSIS — E782 Mixed hyperlipidemia: Secondary | ICD-10-CM | POA: Diagnosis not present

## 2016-05-10 DIAGNOSIS — N401 Enlarged prostate with lower urinary tract symptoms: Secondary | ICD-10-CM | POA: Diagnosis not present

## 2016-05-10 DIAGNOSIS — I10 Essential (primary) hypertension: Secondary | ICD-10-CM | POA: Diagnosis not present

## 2016-05-11 DIAGNOSIS — R7309 Other abnormal glucose: Secondary | ICD-10-CM | POA: Diagnosis not present

## 2016-05-31 DIAGNOSIS — L821 Other seborrheic keratosis: Secondary | ICD-10-CM | POA: Diagnosis not present

## 2016-05-31 DIAGNOSIS — Z08 Encounter for follow-up examination after completed treatment for malignant neoplasm: Secondary | ICD-10-CM | POA: Diagnosis not present

## 2016-05-31 DIAGNOSIS — L570 Actinic keratosis: Secondary | ICD-10-CM | POA: Diagnosis not present

## 2016-05-31 DIAGNOSIS — Z85828 Personal history of other malignant neoplasm of skin: Secondary | ICD-10-CM | POA: Diagnosis not present

## 2016-05-31 DIAGNOSIS — X32XXXA Exposure to sunlight, initial encounter: Secondary | ICD-10-CM | POA: Diagnosis not present

## 2016-06-08 ENCOUNTER — Ambulatory Visit: Payer: Medicare Other | Admitting: Neurology

## 2016-07-21 ENCOUNTER — Ambulatory Visit: Payer: Medicare Other | Admitting: Neurology

## 2016-07-22 ENCOUNTER — Encounter: Payer: Self-pay | Admitting: Neurology

## 2016-08-09 DIAGNOSIS — R682 Dry mouth, unspecified: Secondary | ICD-10-CM | POA: Diagnosis not present

## 2016-08-09 DIAGNOSIS — J301 Allergic rhinitis due to pollen: Secondary | ICD-10-CM | POA: Diagnosis not present

## 2016-08-09 DIAGNOSIS — J310 Chronic rhinitis: Secondary | ICD-10-CM | POA: Diagnosis not present

## 2016-10-13 ENCOUNTER — Encounter: Payer: Self-pay | Admitting: Neurology

## 2016-10-13 ENCOUNTER — Ambulatory Visit (INDEPENDENT_AMBULATORY_CARE_PROVIDER_SITE_OTHER): Payer: Medicare Other | Admitting: Neurology

## 2016-10-13 VITALS — BP 132/80 | HR 84 | Resp 20 | Ht 70.5 in | Wt 209.0 lb

## 2016-10-13 DIAGNOSIS — M25471 Effusion, right ankle: Secondary | ICD-10-CM

## 2016-10-13 DIAGNOSIS — M25472 Effusion, left ankle: Secondary | ICD-10-CM | POA: Diagnosis not present

## 2016-10-13 DIAGNOSIS — G20 Parkinson's disease: Secondary | ICD-10-CM

## 2016-10-13 MED ORDER — CARBIDOPA-LEVODOPA ER 48.75-195 MG PO CPCR: 195.0000 mg | ORAL_CAPSULE | Freq: Four times a day (QID) | ORAL | 3 refills | Status: DC

## 2016-10-13 NOTE — Patient Instructions (Signed)
We will keep your medication the same.  Your tremor is slightly worse on the L.  Try to drink enough water, about 6 glasses.  Try to exercise in the exercise daily, utilize the exercise room.

## 2016-10-13 NOTE — Progress Notes (Signed)
Subjective:    Patient ID: Adam Moss is a 83 y.o. male.  HPI     Interim history:   Adam Moss is a very pleasant 83 year old left-handed gentleman with an underlying medical history of degenerative spine disease, allergies, BPH, hypertension, arthritis, reflux disease, and insomnia, who presents for follow-up consultation of his Parkinson's disease. The patient is unaccompanied today. We had to cancel an appointment for 06/08/2016 and he missed an appointment on 07/21/2016. I last saw him on 02/04/2016, at which time he reported doing okay, tolerating Rytary 195 mg qid without significant side effects. He was not always exercising regularly. He does visit his wife in the afternoon every day. He was not always hydrating well enough. He had gained some weight. He had no recent falls, no complaints of hallucinations or memory issues, occasional depressed mood but no sustained symptoms of depression. I suggested we continue with his medication regimen. He was encouraged to hydrate better and exercise on a more regular basis.  Today, 10/13/2016 (all dictated new, as well as above notes, some dictation done in note pad or Word, outside of chart, may appear as copied):  He reports doing okay, tries to walk, but has occasional Muscle pain in different areas of his lower back and also thigh muscles, sometimes in the ankles. He has been trying to pace himself in that regard, thankfully, he has not had any falls with the exception of one time when he toppled over while reaching for something when he was getting dressed. No injuries. Memory and mood are stable. Sadly, his wife of 50 years passed away in 06/11/16. She was in memory care for her last 2-1/2-3 years of her life. He was visiting her every day and memory care. He is holding up okay in that regard. He does not always drink enough water. Sleep is a little better. He does have nocturia. Sometimes he is drowsy or sleepy during the day and falls  asleep when he is sedentary, watching TV etc. He has noted some increase in his left-sided tremor, overall, feels he is doing well.  The patient's allergies, current medications, family history, past medical history, past social history, past surgical history and problem list were reviewed and updated as appropriate.   Previously (copied from previous notes for reference):    I saw him on 09/24/2015, at which time he reported doing a little better, able to tolerate the Rytary qid. Thankfully, h had no recent falls. He was not sleeping very well at night. He would visit his wife in memory care every day. His 3 children with visit about once a month. Is trying to hydrate well. He had noticed some ankle swelling and residual knee pain bilaterally. He was able to pursue his hobby of building model ships. I suggested he continue with Rytary at the current dose. He was encouraged to try melatonin for sleep.    I saw him on 05/26/2015, at which time he reported doing somewhat better with the new medication, Rytary, with improvement noted in fine motor skills and tremors. He had recent blood work through his primary care physician which I reviewed at the time: Lipid profile was unremarkable with the exception of triglycerides borderline at 155, BMP was normal and CBC was normal. He reported that his leg swelling was a little better. He was avoiding nighttime driving. He was visiting his wife and memory care regularly. He was able to tolerate the new medication which was really reassuring. She was taking Rytary  195 mg, one pill at 9 AM, 1 pill at 5 PM, 1 pill at bedtime which is usually around midnight. He had gained about 10 pounds and was trying to lose weight. He worried about his weight gain but did admit to having good food and lots of choices for dessert at his assisted living place. I suggested a gentle increase in his medication to 1 pill 4 times a day.   I first met him on 02/21/2015 at the request of his  primary neurologist, at which time the patient reported a history of left-sided tremor, fine motor dyscontrol, and gait difficulties. Symptoms dated back to 2-3 years prior. He had multiple medication intolerances particularly issues with GI side effects. I suggested a trial of Rytary, 95 mg strength with titration to 2 pills 3 times a day. I also suggested we proceed with a DaT scan and I referred him to [facility] nuclear medicine. He had the study on 03/19/2015 which showed abnormal image grade 1: Asymmetric uptake with normal or almost normal putamen activity in one hemisphere and with a more marked reduction in the contralateral putamen. This pattern of activity can be seen with Parkinson's disease or related syndromes.    Of note, the radiotracer uptake was decreased on the right. We called the patient with the test results. The patient emailed me in August reporting a slight decrease in his tremor with a new medication and ability to tolerated thus far. He asked for change and pill strength of possible because the medication is very expensive. I changed him to 195 mg strength one pill 3 times a day.    02/21/2015: He has a history of left-sided tremor, fine motor dyscontrol, and gait difficulty. 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 Symptoms date back to 2014 or the year before, when he started having difficulty getting out of a chair, tremors on the left side, slowness, and he started seeing you at the neuroscience center in [facility]. He was diagnosed with Parkinson's disease and tried on different medications but had side effects, primarily GI related side effects. He has a history of cholecystectomy. Sinemet caused severe abdominal pain and nausea. He was tried on Neupro which also caused stomach pain, and on Azilect low-dose he did not think he had any  response. He has been on amantadine. His previous records were reviewed: This includes neurologic office notes from 01/10/2015, 11/08/2014, 09/09/2014, 08/19/2014, 05/20/2014, 02/18/2014, 11/30/2013, 07/06/2013, 06/01/2013, and 02/23/2013. He had a brain MRI years ago but results are not available for my review. He is currently on amantadine 100 mg twice daily. In 2015 he was tried on pramipexole but had severe GI upset. In July 2015 he reported that he had improvement with Neupro but stomach pain. In January 2016 he tried Sinemet but had to stop after a few days only. Earlier this year his Azilect was discontinued. He has no family history of Parkinson's disease. He reports mild memory loss and mild sleep issues. He currently resides in independent living at the [facility] [place]. This is in [place]. His wife is a Marine scientist care. He drives. He has no recent history of falls. He tries to stay active physically. He has right knee pain and wears a soft brace around his right knee. He has a remote history of injury to his right knee. He has been off  of amantadine for the past 2 weeks or so. He had significant blurry vision while on it to the point where he had to see an ophthalmologist. His blurry vision has improved since he stopped the amantadine. He feels that his symptoms have been slowly progressive. He is primarily bothered by his fine motor dyscontrol and tremor. He drives without significant problems but does not drive long distances.  His Past Medical History Is Significant For: Past Medical History:  Diagnosis Date  . Cancer ([place])    skin  . Hyperlipemia   . Hypertension   . Parkinson disease ([place])   . Parkinsonism ([place]) 02/21/2015  . Spinal stenosis   . Tremor     His Past Surgical History Is Significant For: Past Surgical History:  Procedure Laterality Date  . APPENDECTOMY    . CATARACT EXTRACTION    . CHOLECYSTECTOMY    . TONSILLECTOMY    . VASECTOMY      His Family History Is  Significant For: Family History  Problem Relation Age of Onset  . Cancer Mother   . Heart disease Father     His Social History Is Significant For: Social History   Social History  . Marital status: Married    Spouse name: N/A  . Number of children: 3  . Years of education: PhD   Occupational History  . Retired    Social History Main Topics  . Smoking status: Never Smoker  . Smokeless tobacco: Never Used  . Alcohol use No  . Drug use: No  . Sexual activity: Not Asked   Other Topics Concern  . None   Social History Narrative   Denies caffeine use     His Allergies Are:  Allergies  Allergen Reactions  . Carbidopa-Levodopa Other (See Comments)    Severe stomach pains  . Oxycodone-Acetaminophen Other (See Comments)    "boils on skin"  . Tylenol [Acetaminophen]   . Pramipexole Nausea Only  :   His Current Medications Are:  Outpatient Encounter Prescriptions as of 10/13/2016  Medication Sig  . aspirin 81 MG tablet Take 81 mg by mouth daily.  . calcium carbonate (TUMS - DOSED IN MG ELEMENTAL CALCIUM) 500 MG chewable tablet Chew 1 tablet by mouth daily.  . Calcium Citrate 250 MG TABS Take by mouth.  . Carbidopa-Levodopa ER (RYTARY) 48.75-195 MG CPCR Take 195 mg by mouth 4 (four) times daily.  . cetirizine (ZYRTEC) 10 MG tablet Take 10 mg by mouth daily.  . finasteride (PROSCAR) 5 MG tablet Take 5 mg by mouth daily.  . niacin 500 MG tablet Take 500 mg by mouth every morning.   . olmesartan (BENICAR) 20 MG tablet Take 20 mg by mouth daily.  Marland Kitchen omeprazole (PRILOSEC) 40 MG capsule Take 40 mg by mouth daily.  . Simethicone (GAS RELIEF PO) Take by mouth.  . simvastatin (ZOCOR) 10 MG tablet Take 10 mg by mouth daily.  . tamsulosin (FLOMAX) 0.4 MG CAPS capsule Take 0.4 mg by mouth.  . vitamin C (ASCORBIC ACID) 500 MG tablet Take 500 mg by mouth daily.   No facility-administered encounter medications on file as of 10/13/2016.   :  Review of Systems:  Out of a complete 14  point review of systems, all are reviewed and negative with the exception of these symptoms as listed below:  Review of Systems  Neurological:       Patient states that he gets a lot of muscle pain. And sometimes get pains in spots where  there is no muscle.     Objective:  Neurologic Exam  Physical Exam Physical Examination:   Vitals:   10/13/16 1123  BP: 132/80  Pulse: 84  Resp: 20    General Examination: The patient is a very pleasant 83 y.o. male in no acute distress. He appears well-developed and well-nourished and well groomed.   HEENT: Normocephalic, atraumatic, pupils are equal, round and reactive to light and accommodation. He has corrective eyeglasses. He has mild saccadic breakdown and smooth pursuit testing, no nystagmus is noted. He had cataract repair on the left, he has very minimal gaze limitation to upper gaze. He has a mild decrease in eye blink rate and mild facial masking. He has no significant drooling, oropharynx examination reveals mild mouth dryness, tongue protrudes centrally and palate elevates symmetrically, mild hypophonia noted.   Chest: Clear to auscultation without wheezing, rhonchi or crackles noted.  Heart: S1+S2+0, regular and normal without murmurs, rubs or gallops noted.   Abdomen: Soft, non-tender and non-distended with normal bowel sounds appreciated on auscultation.  Extremities: There is 1+ pitting edema in the distal lower extremities bilaterally, stable.   Skin: Warm and dry without trophic changes noted.  Musculoskeletal: exam reveals no obvious joint deformities, tenderness or joint swelling or erythema.   Neurologically:  Mental status: The patient is awake, alert and oriented in all 4 spheres. His immediate and remote memory, attention, language skills and fund of knowledge are appropriate. There is no evidence of aphasia, agnosia, apraxia or anomia. Speech is clear with normal prosody and enunciation, mild hypophonia. Thought process  is linear. Mood is normal and affect is normal.  Cranial nerves II - XII are as described above under HEENT exam. In addition: shoulder shrug is normal with equal shoulder height noted. Motor exam: Normal bulk, and strength for age. He has increase in tone on the left side. He has a mild to moderate resting tremor in the left upper extremity, no other resting tremor is noted today, tremor is a little worse from last time. Romberg is negative. Reflexes are 1+ in the upper extremities, trace in the lower extremities, stable, fine motor testing is mild to moderately impaired on the left and minimally to mildly impaired on the right, mostly stable.  Cerebellar testing: No dysmetria or intention tremor. There is no truncal or gait ataxia.  Sensory exam: intact to light touch in the upper and lower extremities.  Gait, station and balance: He stands with mild difficulty, he pushes himself up with his hands. He needs no assistance. Posture is mildly stooped for age, balance is slightly impaired, he walks with decreased arm swing on the left, slightly decreased stride length and decreased pace is noted. He turns in 3 steps.   Assessment and Plan:  In summary, Adam Moss is a very pleasant 83 y.o.-year old male with an underlying medical history of degenerative spine disease, allergies, BPH, arthritis, reflux disease, and hypertension, who presents for follow-up consultation of his left-sided predominant Parkinson's disease, complicated by significant medication intolerances, mostly secondary to GI related symptoms including stomach pain, exacerbation of reflux and nausea and vomiting. He was unable to tolerate Mirapex and Sinemet. He has been able to tolerate Rytary brand-name, which was started at 95 mg strength, up to 2 pills 3 times a day and then we switched to 195 mg strength 3 times a day, then 4 times a day, with improvement in symptoms including tremor and fine motor skills. I suggested that he maintain  a regular  exercise regimen, try to do a variety of things including utilizing the elliptical machines and stationary bike at the exercise gym that he has available. He is advised to stay with well-hydrated with water. We mutually agreed to keep his medication regimen the same. Of note, he had a DaT scan in August 2016 which was supportive of Parkinson's disease with decreased radiotracer uptake on the right.  history and physical exam concerning for obstructive sleep apnea (OSA). I suggested a 6 month follow-up, sooner as needed. I refilled his prescription for Rytary for 90 days with refills. I answered all his questions today and he was in agreement. I spent 25 minutes in total face-to-face time with the patient, more than 50% of which was spent in counseling and coordination of care, reviewing test results, reviewing medication and discussing or reviewing the diagnosis of PD, its prognosis and treatment options. Pertinent laboratory and imaging test results that were available during this visit with the patient were reviewed by me and considered in my medical decision making (see chart for details).

## 2016-11-17 DIAGNOSIS — E782 Mixed hyperlipidemia: Secondary | ICD-10-CM | POA: Diagnosis not present

## 2016-11-17 DIAGNOSIS — K219 Gastro-esophageal reflux disease without esophagitis: Secondary | ICD-10-CM | POA: Diagnosis not present

## 2016-11-17 DIAGNOSIS — R600 Localized edema: Secondary | ICD-10-CM | POA: Diagnosis not present

## 2016-11-17 DIAGNOSIS — Z125 Encounter for screening for malignant neoplasm of prostate: Secondary | ICD-10-CM | POA: Diagnosis not present

## 2016-11-17 DIAGNOSIS — R739 Hyperglycemia, unspecified: Secondary | ICD-10-CM | POA: Diagnosis not present

## 2016-11-17 DIAGNOSIS — I10 Essential (primary) hypertension: Secondary | ICD-10-CM | POA: Diagnosis not present

## 2016-11-17 DIAGNOSIS — N401 Enlarged prostate with lower urinary tract symptoms: Secondary | ICD-10-CM | POA: Diagnosis not present

## 2016-11-17 DIAGNOSIS — Z0000 Encounter for general adult medical examination without abnormal findings: Secondary | ICD-10-CM | POA: Diagnosis not present

## 2016-11-17 DIAGNOSIS — K5790 Diverticulosis of intestine, part unspecified, without perforation or abscess without bleeding: Secondary | ICD-10-CM | POA: Diagnosis not present

## 2016-12-10 DIAGNOSIS — Z1211 Encounter for screening for malignant neoplasm of colon: Secondary | ICD-10-CM | POA: Diagnosis not present

## 2017-02-07 DIAGNOSIS — D225 Melanocytic nevi of trunk: Secondary | ICD-10-CM | POA: Diagnosis not present

## 2017-02-07 DIAGNOSIS — X32XXXA Exposure to sunlight, initial encounter: Secondary | ICD-10-CM | POA: Diagnosis not present

## 2017-02-07 DIAGNOSIS — L400 Psoriasis vulgaris: Secondary | ICD-10-CM | POA: Diagnosis not present

## 2017-02-07 DIAGNOSIS — Z85828 Personal history of other malignant neoplasm of skin: Secondary | ICD-10-CM | POA: Diagnosis not present

## 2017-02-07 DIAGNOSIS — L570 Actinic keratosis: Secondary | ICD-10-CM | POA: Diagnosis not present

## 2017-02-07 DIAGNOSIS — D692 Other nonthrombocytopenic purpura: Secondary | ICD-10-CM | POA: Diagnosis not present

## 2017-03-25 DIAGNOSIS — H903 Sensorineural hearing loss, bilateral: Secondary | ICD-10-CM | POA: Diagnosis not present

## 2017-04-18 ENCOUNTER — Ambulatory Visit (INDEPENDENT_AMBULATORY_CARE_PROVIDER_SITE_OTHER): Payer: Medicare Other | Admitting: Neurology

## 2017-04-18 ENCOUNTER — Encounter: Payer: Self-pay | Admitting: Neurology

## 2017-04-18 VITALS — BP 111/79 | HR 96 | Ht 70.5 in | Wt 203.0 lb

## 2017-04-18 DIAGNOSIS — G20 Parkinson's disease: Secondary | ICD-10-CM

## 2017-04-18 DIAGNOSIS — M25472 Effusion, left ankle: Secondary | ICD-10-CM | POA: Diagnosis not present

## 2017-04-18 DIAGNOSIS — M25471 Effusion, right ankle: Secondary | ICD-10-CM | POA: Diagnosis not present

## 2017-04-18 NOTE — Patient Instructions (Addendum)
Let's change the timing of your Rytary of 1 pill at 6 AM, 11 AM, 4 PM, 10 PM.  We may consider increasing your Rytary to 1 pill 5 times a day in the future.

## 2017-04-18 NOTE — Progress Notes (Signed)
Subjective:    Patient ID: Adam Moss is a 83 y.o. male.  HPI     Interim history:  [name] is a very pleasant 83 year old left-handed gentleman with an underlying medical history of degenerative spine disease, allergies, BPH, hypertension, arthritis, reflux disease, and insomnia, who presents for follow-up consultation of his Parkinson's disease. The patient is unaccompanied today. I last saw him on 10/13/2016, at which time he reported doing okay. He had occasional muscle pain in different areas of his lower back and also thighs, sometimes around the ankles. He was trying to walk on a regular basis. Memory mood were stable. Sadly, his wife passed away in 06-Jun-2016. I suggested, we keep his meds the same.   Today, 04/18/17 (all dictated new, as well as above notes, some dictation done in note pad or Word, outside of chart, may appear as copied):  He reports that his tremor is worse at times, particularly when trying to use the computer mouse. He does not feel his symptoms are bad enough to increase his medication. He takes it 4 times a day, with meals and then at bedtime. He goes to bed around midnight, he wakes up between 9 and 10.   The patient's allergies, current medications, family history, past medical history, past social history, past surgical history and problem list were reviewed and updated as appropriate.    Previously (copied from previous notes for reference):    We had to cancel an appointment for 06/08/2016 and he missed an appointment on 07/21/2016. I saw him on 02/04/2016, at which time he reported doing okay, tolerating Rytary 195 mg qid without significant side effects. He was not always exercising regularly. He does visit his wife in the afternoon every day. He was not always hydrating well enough. He had gained some weight. He had no recent falls, no complaints of hallucinations or memory issues, occasional depressed mood but no sustained symptoms of depression.  I suggested we continue with his medication regimen. He was encouraged to hydrate better and exercise on a more regular basis.   I saw him on 09/24/2015, at which time he reported doing a little better, able to tolerate the Rytary qid. Thankfully, h had no recent falls. He was not sleeping very well at night. He would visit his wife in memory care every day. His 3 children with visit about once a month. Is trying to hydrate well. He had noticed some ankle swelling and residual knee pain bilaterally. He was able to pursue his hobby of building model ships. I suggested he continue with Rytary at the current dose. He was encouraged to try melatonin for sleep.    I saw him on 05/26/2015, at which time he reported doing somewhat better with the new medication, Rytary, with improvement noted in fine motor skills and tremors. He had recent blood work through his primary care physician which I reviewed at the time: Lipid profile was unremarkable with the exception of triglycerides borderline at 155, BMP was normal and CBC was normal. He reported that his leg swelling was a little better. He was avoiding nighttime driving. He was visiting his wife and memory care regularly. He was able to tolerate the new medication which was really reassuring. She was taking Rytary 195 mg, one pill at 9 AM, 1 pill at 5 PM, 1 pill at bedtime which is usually around midnight. He had gained about 10 pounds and was trying to lose weight. He worried about his weight gain but did  admit to having good food and lots of choices for dessert at his assisted living place. I suggested a gentle increase in his medication to 1 pill 4 times a day.   I first met him on 02/21/2015 at the request of his primary neurologist, at which time the patient reported a history of left-sided tremor, fine motor dyscontrol, and gait difficulties. Symptoms dated back to 2-3 years prior. He had multiple medication intolerances particularly issues with GI side  effects. I suggested a trial of Rytary, 95 mg strength with titration to 2 pills 3 times a day. I also suggested we proceed with a DaT scan and I referred him to [facility] nuclear medicine. He had the study on 03/19/2015 which showed abnormal image grade 1: Asymmetric uptake with normal or almost normal putamen activity in one hemisphere and with a more marked reduction in the contralateral putamen. This pattern of activity can be seen with Parkinson's disease or related syndromes.    Of note, the radiotracer uptake was decreased on the right. We called the patient with the test results. The patient emailed me in August reporting a slight decrease in his tremor with a new medication and ability to tolerated thus far. He asked for change and pill strength of possible because the medication is very expensive. I changed him to 195 mg strength one pill 3 times a day.    02/21/2015: He has a history of left-sided tremor, fine motor dyscontrol, and gait difficulty. 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 Symptoms date back to 2014 or the year before, when he started having difficulty getting out of a chair, tremors on the left side, slowness, and he started seeing you at the neuroscience center in [facility]. He was diagnosed with Parkinson's disease and tried on different medications but had side effects, primarily GI related side effects. He has a history of cholecystectomy. Sinemet caused severe abdominal pain and nausea. He was tried on Neupro which also caused stomach pain, and on Azilect low-dose he did not think he had any response. He has been on amantadine. His previous records were reviewed: This includes neurologic office notes from 01/10/2015, 11/08/2014, 09/09/2014, 08/19/2014, 05/20/2014, 02/18/2014, 11/30/2013, 07/06/2013, 06/01/2013, and 02/23/2013. He had a brain  MRI years ago but results are not available for my review. He is currently on amantadine 100 mg twice daily. In 2015 he was tried on pramipexole but had severe GI upset. In July 2015 he reported that he had improvement with Neupro but stomach pain. In January 2016 he tried Sinemet but had to stop after a few days only. Earlier this year his Azilect was discontinued. He has no family history of Parkinson's disease. He reports mild memory loss and mild sleep issues. He currently resides in independent living at the [facility] [place]. This is in [place]. His wife is a Marine scientist care. He drives. He has no recent history of falls. He tries to stay active physically. He has right knee pain and wears a soft brace around his right knee. He has a remote history of injury to his right knee. He has been off of amantadine for the past 2 weeks or so. He had significant blurry vision while on it to the point where he had to see an ophthalmologist. His blurry vision has improved since he stopped the amantadine. He feels that his  symptoms have been slowly progressive. He is primarily bothered by his fine motor dyscontrol and tremor. He drives without significant problems but does not drive long distances.  His Past Medical History Is Significant For: Past Medical History:  Diagnosis Date  . Cancer ([place])    skin  . Hyperlipemia   . Hypertension   . Parkinson disease ([place])   . Parkinsonism ([place]) 02/21/2015  . Spinal stenosis   . Tremor     His Past Surgical History Is Significant For: Past Surgical History:  Procedure Laterality Date  . APPENDECTOMY    . CATARACT EXTRACTION    . CHOLECYSTECTOMY    . TONSILLECTOMY    . VASECTOMY      His Family History Is Significant For: Family History  Problem Relation Age of Onset  . Cancer Mother   . Heart disease Father     His Social History Is Significant For: Social History   Social History  . Marital status: Married    Spouse name: N/A  . Number of  children: 3  . Years of education: PhD   Occupational History  . Retired    Social History Main Topics  . Smoking status: Never Smoker  . Smokeless tobacco: Never Used  . Alcohol use No  . Drug use: No  . Sexual activity: Not Asked   Other Topics Concern  . None   Social History Narrative   Denies caffeine use     His Allergies Are:  Allergies  Allergen Reactions  . Carbidopa-Levodopa Other (See Comments)    Severe stomach pains  . Oxycodone-Acetaminophen Other (See Comments)    "boils on skin"  . Tylenol [Acetaminophen]   . Pramipexole Nausea Only  :   His Current Medications Are:  Outpatient Encounter Prescriptions as of 04/18/2017  Medication Sig  . aspirin 81 MG tablet Take 81 mg by mouth daily.  . calcium carbonate (TUMS - DOSED IN MG ELEMENTAL CALCIUM) 500 MG chewable tablet Chew 1 tablet by mouth daily.  . Calcium Citrate 250 MG TABS Take by mouth.  . Carbidopa-Levodopa ER (RYTARY) 48.75-195 MG CPCR Take 195 mg by mouth 4 (four) times daily.  . cetirizine (ZYRTEC) 10 MG tablet Take 10 mg by mouth daily.  . finasteride (PROSCAR) 5 MG tablet Take 5 mg by mouth daily.  . niacin 500 MG tablet Take 500 mg by mouth every morning.   . olmesartan (BENICAR) 20 MG tablet Take 20 mg by mouth daily.  Marland Kitchen omeprazole (PRILOSEC) 40 MG capsule Take 40 mg by mouth daily.  . Simethicone (GAS RELIEF PO) Take by mouth.  . simvastatin (ZOCOR) 10 MG tablet Take 10 mg by mouth daily.  . tamsulosin (FLOMAX) 0.4 MG CAPS capsule Take 0.4 mg by mouth.  . vitamin C (ASCORBIC ACID) 500 MG tablet Take 500 mg by mouth daily.   No facility-administered encounter medications on file as of 04/18/2017.   :  Review of Systems:   Out of a complete 14 point review of systems, all are reviewed and negative with the exception of these symptoms as listed below:  Review of Systems  Neurological:       Pt says that he is doing fine, but mentioned that the sinemet ER could be doing better. He reports  no falls.    Objective:  Neurological Exam  Physical Exam Physical Examination:   Vitals:   04/18/17 1137  BP: 111/79  Pulse: 96    General Examination: The patient is a very pleasant  83 y.o. male in no acute distress. He appears well-developed and well-nourished and well groomed.   HEENT: Normocephalic, atraumatic, pupils are equal, round and reactive to light and accommodation. He has corrective eyeglasses. He has mild saccadic breakdown and smooth pursuit testing, no nystagmus is noted. He had cataract repair on the left, he has very minimal gaze limitation to upper gaze. He has a mild decrease in eye blink rate and mild facial masking. He has no significant drooling, oropharynx examination reveals mild mouth dryness, tongue protrudes centrally and palate elevates symmetrically, mild hypophonia noted. Recent tooth extractions, L upper quadrant.   Chest: Clear to auscultation without wheezing, rhonchi or crackles noted.  Heart: S1+S2+0, regular and normal without murmurs, rubs or gallops noted.   Abdomen: Soft, non-tender and non-distended with normal bowel sounds appreciated on auscultation.  Extremities: There is trace edema in the distal lower extremities bilaterally, L more pronounced.    Skin: Warm and dry without trophic changes noted.  Musculoskeletal: exam reveals no obvious joint deformities, tenderness or joint swelling or erythema.   Neurologically:  Mental status: The patient is awake, alert and oriented in all 4 spheres. His immediate and remote memory, attention, language skills and fund of knowledge are appropriate. There is no evidence of aphasia, agnosia, apraxia or anomia. Speech is clear with normal prosody and enunciation, mild hypophonia. Thought process is linear. Mood is normal and affect is normal.  Cranial nerves II - XII are as described above under HEENT exam. In addition: shoulder shrug is normal with equal shoulder height noted. Motor exam:  Normal bulk, and strength for age. He has increase in tone on the left side. He has a mild to moderate resting tremor in the left upper extremity, no other resting tremor is noted today, tremor is a little worse from last time. Romberg is negative. Reflexes are 1+ in the upper extremities, trace in the lower extremities, stable, fine motor testing is mild to moderately impaired on the left and minimally to mildly impaired on the right, mostly stable.  Cerebellar testing: No dysmetria or intention tremor. There is no truncal or gait ataxia.  Sensory exam: intact to light touch in the upper and lower extremities.  Gait, station and balance: He stands with mild difficulty, he pushes himself up with his hands. He needs no assistance. Posture is mildly stooped for age, stable. Balance is slightly impaired, he walks with decreased arm swing on the left, slightly decreased stride length and decreased pace is noted. He turns in 3 steps.   Assessment and Plan:  In summary, Nicklos Gaxiola is a very pleasant 83 year old male with an underlying medical history of degenerative spine disease, allergies, BPH, arthritis, reflux disease, and hypertension, who presents for follow-up consultation of his left-sided predominant Parkinson's disease, complicated by significant medication intolerances in the past, mostly secondary to GI related symptoms including stomach pain, exacerbation of reflux and nausea and vomiting. He was unable to tolerate Mirapex and Sinemet. He has been able to tolerate Rytary brand-name, which was started at 95 mg strength, up to 2 pills 3 times a day and then we switched to 195 mg strength 3 times a day, then 4 times a day, with improvement in symptoms including tremor and fine motor skills. I suggested that he maintain a 4 times a day schedule but try to adhere to a schedule that is away from his mealtimes which may help with absorption a little better. To that end, he will take 1 pill around  6 AM,  another around 11, then around 4, and last dose around 9 or 10 PM. We will consider increasing his medication to 1 pill 5 times a day in the near future and monitor his symptoms. He is encouraged to maintain a regular exercise regimen, also stay well-hydrated and well rested. He did not need a refill today. I suggested a four-month recheck, sooner as needed. I answered all his questions today and the patient was in agreement. Of note, he had a DaT scan in August 2016 which was supportive of Parkinson's disease with decreased radiotracer uptake on the right.  I spent 20 minutes in total face-to-face time with the patient, more than 50% of which was spent in counseling and coordination of care, reviewing test results, reviewing medication and discussing or reviewing the diagnosis of OSA, its prognosis and treatment options. Pertinent laboratory and imaging test results that were available during this visit with the patient were reviewed by me and considered in my medical decision making (see chart for details).

## 2017-04-19 DIAGNOSIS — Z23 Encounter for immunization: Secondary | ICD-10-CM | POA: Diagnosis not present

## 2017-05-02 DIAGNOSIS — H2511 Age-related nuclear cataract, right eye: Secondary | ICD-10-CM | POA: Diagnosis not present

## 2017-05-02 DIAGNOSIS — H35033 Hypertensive retinopathy, bilateral: Secondary | ICD-10-CM | POA: Diagnosis not present

## 2017-05-02 DIAGNOSIS — H04123 Dry eye syndrome of bilateral lacrimal glands: Secondary | ICD-10-CM | POA: Diagnosis not present

## 2017-05-25 DIAGNOSIS — I10 Essential (primary) hypertension: Secondary | ICD-10-CM | POA: Diagnosis not present

## 2017-05-25 DIAGNOSIS — K219 Gastro-esophageal reflux disease without esophagitis: Secondary | ICD-10-CM | POA: Diagnosis not present

## 2017-05-25 DIAGNOSIS — N401 Enlarged prostate with lower urinary tract symptoms: Secondary | ICD-10-CM | POA: Diagnosis not present

## 2017-05-25 DIAGNOSIS — E782 Mixed hyperlipidemia: Secondary | ICD-10-CM | POA: Diagnosis not present

## 2017-05-31 DIAGNOSIS — J0100 Acute maxillary sinusitis, unspecified: Secondary | ICD-10-CM | POA: Diagnosis not present

## 2017-06-14 ENCOUNTER — Ambulatory Visit
Admission: RE | Admit: 2017-06-14 | Discharge: 2017-06-14 | Disposition: A | Discharge status: Home / Self Care | Payer: Medicare Other | Source: Ambulatory Visit | Attending: Otolaryngology | Admitting: Otolaryngology

## 2017-06-14 ENCOUNTER — Other Ambulatory Visit: Payer: Self-pay | Admitting: Otolaryngology

## 2017-06-14 DIAGNOSIS — R059 Cough, unspecified: Secondary | ICD-10-CM

## 2017-06-14 DIAGNOSIS — J0100 Acute maxillary sinusitis, unspecified: Secondary | ICD-10-CM | POA: Diagnosis not present

## 2017-06-14 DIAGNOSIS — R05 Cough: Secondary | ICD-10-CM

## 2017-06-24 DIAGNOSIS — J9801 Acute bronchospasm: Secondary | ICD-10-CM | POA: Diagnosis not present

## 2017-06-24 DIAGNOSIS — J069 Acute upper respiratory infection, unspecified: Secondary | ICD-10-CM | POA: Diagnosis not present

## 2017-07-14 ENCOUNTER — Encounter: Payer: Self-pay | Admitting: Internal Medicine

## 2017-07-14 ENCOUNTER — Ambulatory Visit (INDEPENDENT_AMBULATORY_CARE_PROVIDER_SITE_OTHER): Payer: Medicare Other | Admitting: Internal Medicine

## 2017-07-14 VITALS — BP 92/70 | HR 102 | Ht 70.0 in | Wt 202.0 lb

## 2017-07-14 DIAGNOSIS — J449 Chronic obstructive pulmonary disease, unspecified: Secondary | ICD-10-CM | POA: Diagnosis not present

## 2017-07-14 MED ORDER — BUDESONIDE-FORMOTEROL FUMARATE 160-4.5 MCG/ACT IN AERO: 2.0000 | INHALATION_SPRAY | Freq: Two times a day (BID) | RESPIRATORY_TRACT | 12 refills | Status: DC

## 2017-07-14 NOTE — Progress Notes (Signed)
Name: Adam Moss MRN: [id number] DOB: June 26, 1934     CONSULTATION DATE: 07/14/2017   REFERRING MD : Pryor Ochoa  CHIEF COMPLAINT:  Cough  STUDIES:  CXR 05/2017 I have Independently reviewed images of  CXR  on 07/14/2017 Interpretation: no acute findings No opacities    HISTORY OF PRESENT ILLNESS:  83 year old pleasant white male seen today for chronic cough Patient was diagnosed with acute sinus infection by ENT approximately 2 months ago Patient was prescribed prednisone and Augmentin for 10 days Patient noticed improvement of his sinus infection however he had some postnasal drip and a cough that was worsening Patient states that he also has some chest congestion and intermittent wheezing Patient stated he did feel better with steroids and antibiotics and also uses a rescue inhaler inhaler albuterol and feels better with this regimen  There is no signs of infection at this time No fevers no chills Patient states that appetite is good Patient lives in the [place] his wife died last year Patient does not have any chest pain Patient has intermittent shortness of breath and dyspnea on exertion Patient is a non-smoker however has significant secondhand smoke exposure for 25 years  PAST MEDICAL HISTORY :   has a past medical history of Cancer ([place]), Hyperlipemia, Hypertension, Parkinson disease ([place]), Parkinsonism ([place]) (02/21/2015), Spinal stenosis, and Tremor.  has a past surgical history that includes Appendectomy; Tonsillectomy; Vasectomy; Cholecystectomy; and Cataract extraction. Prior to Admission medications   Medication Sig Start Date End Date Taking? Authorizing Provider  aspirin 81 MG tablet Take 81 mg by mouth daily.   Yes [provider]  calcium carbonate (TUMS - DOSED IN MG ELEMENTAL CALCIUM) 500 MG chewable tablet Chew 1 tablet by mouth daily.   Yes [provider]  Calcium Citrate 250 MG TABS Take by mouth.   Yes [provider]  Carbidopa-Levodopa ER (RYTARY) 48.75-195 MG CPCR Take 195 mg by mouth 4 (four) times daily. 10/13/16  Yes Star Age, MD  cetirizine (ZYRTEC) 10 MG tablet Take 10 mg by mouth daily.   Yes [provider]  finasteride (PROSCAR) 5 MG tablet Take 5 mg by mouth daily.   Yes [provider]  niacin 500 MG tablet Take 500 mg by mouth every morning.    Yes [provider]  olmesartan (BENICAR) 20 MG tablet Take 20 mg by mouth daily.   Yes [provider]  omeprazole (PRILOSEC) 40 MG capsule Take 40 mg by mouth daily.   Yes [provider]  Simethicone (GAS RELIEF PO) Take by mouth.   Yes [provider]  simvastatin (ZOCOR) 10 MG tablet Take 10 mg by mouth daily.   Yes [provider]  tamsulosin (FLOMAX) 0.4 MG CAPS capsule Take 0.4 mg by mouth.   Yes [provider]  vitamin C (ASCORBIC ACID) 500 MG tablet Take 500 mg by mouth daily.   Yes [provider]   Allergies  Allergen Reactions  . Carbidopa-Levodopa Other (See Comments)    Severe stomach pains  . Oxycodone-Acetaminophen Other (See Comments)    "boils on skin"  . Tylenol [Acetaminophen]   . Pramipexole Nausea Only    FAMILY HISTORY:  family history includes Cancer in his mother; Heart disease in his father. SOCIAL HISTORY:  reports that  has never smoked. he has never used smokeless tobacco. He reports that he does not drink alcohol or use drugs.  REVIEW OF SYSTEMS:   Constitutional: Negative for fever, chills, weight loss,  malaise/fatigue and diaphoresis.  HENT: Negative for hearing loss, ear pain, nosebleeds, congestion, sore throat, neck pain, tinnitus and ear discharge.   Eyes: Negative for blurred vision, double vision, photophobia, pain, discharge and redness.  Respiratory: + cough, hemoptysis, sputum production, +shortness of breath,+ wheezing and stridor.   Cardiovascular: Negative for chest pain, palpitations, orthopnea,  claudication, leg swelling and PND.  Gastrointestinal: Negative for heartburn, nausea, vomiting, abdominal pain, diarrhea, constipation, blood in stool and melena.  Genitourinary: Negative for dysuria, urgency, frequency, hematuria and flank pain.  Musculoskeletal: Negative for myalgias, back pain, joint pain and falls.  Skin: Negative for itching and rash.  Neurological: Negative for dizziness, tingling, tremors, sensory change, speech change, focal weakness, seizures, loss of consciousness, weakness and headaches.  Endo/Heme/Allergies: Negative for environmental allergies and polydipsia. Does not bruise/bleed easily.  ALL OTHER ROS ARE NEGATIVE    BP 92/70 (BP Location: Left Arm, Cuff Size: Normal)   Pulse (!) 102   Ht 5\' 10"  (1.778 m)   Wt 202 lb (91.6 kg)   SpO2 97%   BMI 28.98 kg/m   Physical Examination:   GENERAL:NAD, no fevers, chills, no weakness no fatigue HEAD: Normocephalic, atraumatic.  EYES: Pupils equal, round, reactive to light. Extraocular muscles intact. No scleral icterus.  MOUTH: Moist mucosal membrane.   EAR, NOSE, THROAT: Clear without exudates. No external lesions.  NECK: Supple. No thyromegaly. No nodules. No JVD.  PULMONARY:CTA B/L no wheezes, no crackles, no rhonchi CARDIOVASCULAR: S1 and S2. Regular rate and rhythm. No murmurs, rubs, or gallops. No edema.  GASTROINTESTINAL: Soft, nontender, nondistended. No masses. Positive bowel sounds.  MUSCULOSKELETAL: No swelling, clubbing, or edema. Range of motion full in all extremities.  NEUROLOGIC: Cranial nerves II through XII are intact. No gross focal neurological deficits.  SKIN: No ulceration, lesions, rashes, or cyanosis. Skin warm and dry. Turgor intact.  PSYCHIATRIC: Mood, affect within normal limits. The patient is awake, alert and oriented x 3. Insight, judgment intact.      ASSESSMENT / PLAN: 83 year old pleasant white male seen today for chronic cough most likely related to upper respiratory  infection with acute sinus infection with postnasal drip in the setting of intermittent reactive airways disease with probable underlying COPD secondary to secondhand smoke exposure.  At this time I would recommend starting Symbicort 2 puffs twice a day which is a inhaled steroid and a long-acting beta agonist and also to use his albuterol inhaler as needed.  1.start Symbicort 2 puffs twice daily 2.Albuterol 2-4 puffs as needed for wheezing 3.Continue cough suppressant with Robitussin DM as needed 4.Patient will need pulmonary function testing with 6-minute walk in about 1 month and then follow-up in 3 months 5.no indication for antibiotics or prednisone therapy at this time  Patient  satisfied with Plan of action and management. All questions answered Follow up after tests completed    Corrin Parker, M.D.  [facility]  Medical Director [place] Director [facility] Cardio-Pulmonary Department

## 2017-07-14 NOTE — Patient Instructions (Signed)
Check PFT's in 1 month Start Symbicort inhaler Use albuterol as needed Cough syrup as needed

## 2017-08-11 ENCOUNTER — Telehealth: Payer: Self-pay

## 2017-08-11 NOTE — Telephone Encounter (Signed)
I called pt. I advised him that Dr. Rexene Alberts needs his appt to be moved to 2:30pm on 08/18/17 from the 1:00pm. Pt is agreeable to this. Pt verbalized understanding of new appt time.

## 2017-08-16 ENCOUNTER — Ambulatory Visit: Payer: Medicare Other | Attending: Internal Medicine

## 2017-08-16 DIAGNOSIS — J449 Chronic obstructive pulmonary disease, unspecified: Secondary | ICD-10-CM

## 2017-08-16 DIAGNOSIS — R0609 Other forms of dyspnea: Secondary | ICD-10-CM | POA: Diagnosis not present

## 2017-08-18 ENCOUNTER — Ambulatory Visit: Payer: Medicare Other | Admitting: Neurology

## 2017-08-18 ENCOUNTER — Ambulatory Visit (INDEPENDENT_AMBULATORY_CARE_PROVIDER_SITE_OTHER): Payer: Medicare Other | Admitting: Neurology

## 2017-08-18 ENCOUNTER — Encounter: Payer: Self-pay | Admitting: Neurology

## 2017-08-18 DIAGNOSIS — G20 Parkinson's disease: Secondary | ICD-10-CM | POA: Diagnosis not present

## 2017-08-18 MED ORDER — CARBIDOPA-LEVODOPA ER 48.75-195 MG PO CPCR: 195.0000 mg | ORAL_CAPSULE | Freq: Four times a day (QID) | ORAL | 3 refills | Status: DC

## 2017-08-18 NOTE — Patient Instructions (Addendum)
I think your exam is stable, which is good.  I think we can keep your medication the same, Rytary 195 mg 1 pill 4 times.  Please stay well hydrated with water and continue to exercise.

## 2017-08-18 NOTE — Progress Notes (Signed)
Subjective:    Moss ID: Adam Moss is a 84 y.o. male.  HPI     Interim history:   Adam Moss is a very pleasant 84 year old left-handed gentleman with an underlying medical history of degenerative spine disease, allergies, BPH, hypertension, arthritis, reflux disease, and insomnia, who presents for follow-up consultation of his Parkinson's disease. Adam Moss is unaccompanied today. I last saw him on 04/18/17, at which time he reported worsening tremor, he was having some difficulty with fine motor control. He was taking Rytary 4 times a day and at bedtime. He was trying to keep set schedule for his bedtime routine and wake up routine and medication regimen. I suggested he change Adam timing of his medication to 1 pill at 6, 11, 4 PM and 10 PM. We talked about potentially increasing Adam medication to 1 pill 5 times a day.  Today, 08/18/2017 (all dictated new, as well as above notes, some dictation done in note pad or Word, outside of chart, may appear as copied):  He reports doing okay, stable. Thankfully, no falls.   Adam Moss's allergies, current medications, family history, past medical history, past social history, past surgical history and problem list were reviewed and updated as appropriate.    Previously (copied from previous notes for reference):   I saw him on 10/13/2016, at which time he reported doing okay. He had occasional muscle pain in different areas of his lower back and also thighs, sometimes around Adam ankles. He was trying to walk on a regular basis. Memory mood were stable. Sadly, his wife passed away in 07/14/16. I suggested, we keep his meds Adam same.     We had to cancel an appointment for 06/08/2016 and he missed an appointment on 07/21/2016. I saw him on 02/04/2016, at which time he reported doing okay, tolerating Rytary 195 mg qid without significant side effects. He was not always exercising regularly. He does visit his wife in Adam afternoon every  day. He was not always hydrating well enough. He had gained some weight. He had no recent falls, no complaints of hallucinations or memory issues, occasional depressed mood but no sustained symptoms of depression. I suggested we continue with his medication regimen. He was encouraged to hydrate better and exercise on a more regular basis.   I saw him on 09/24/2015, at which time he reported doing a little better, able to tolerate Adam Rytary qid. Thankfully, h had no recent falls. He was not sleeping very well at night. He would visit his wife in memory care every day. His 3 children with visit about once a month. Is trying to hydrate well. He had noticed some ankle swelling and residual knee pain bilaterally. He was able to pursue his hobby of building model ships. I suggested he continue with Rytary at Adam current dose. He was encouraged to try melatonin for sleep.    I saw him on 05/26/2015, at which time he reported doing somewhat better with Adam new medication, Rytary, with improvement noted in fine motor skills and tremors. He had recent blood work through his primary care physician which I reviewed at Adam time: Lipid profile was unremarkable with Adam exception of triglycerides borderline at 155, BMP was normal and CBC was normal. He reported that his leg swelling was a little better. He was avoiding nighttime driving. He was visiting his wife and memory care regularly. He was able to tolerate Adam new medication which was really reassuring. She was taking Rytary 195 mg,  one pill at 9 AM, 1 pill at 5 PM, 1 pill at bedtime which is usually around midnight. He had gained about 10 pounds and was trying to lose weight. He worried about his weight gain but did admit to having good food and lots of choices for dessert at his assisted living place. I suggested a gentle increase in his medication to 1 pill 4 times a day.   I first met him on 02/21/2015 at Adam request of his primary neurologist, at which time Adam  Moss reported a history of left-sided tremor, fine motor dyscontrol, and gait difficulties. Symptoms dated back to 2-3 years prior. He had multiple medication intolerances particularly issues with GI side effects. I suggested a trial of Rytary, 95 mg strength with titration to 2 pills 3 times a day. I also suggested we proceed with a DaT scan and I referred him to [facility] nuclear medicine. He had Adam study on 03/19/2015 which showed abnormal image grade 1: Asymmetric uptake with normal or almost normal putamen activity in one hemisphere and with a more marked reduction in Adam contralateral putamen. This pattern of activity can be seen with Parkinson's disease or related syndromes.    Of note, Adam radiotracer uptake was decreased on Adam right. We called Adam Moss with Adam test results. Adam Moss emailed me in August reporting a slight decrease in his tremor with a new medication and ability to tolerated thus far. He asked for change and pill strength of possible because Adam medication is very expensive. I changed him to 195 mg strength one pill 3 times a day.    02/21/2015: He has a history of left-sided tremor, fine motor dyscontrol, and gait difficulty. 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 Symptoms date back to 2014 or Adam year before, when he started having difficulty getting out of a chair, tremors on Adam left side, slowness, and he started seeing [facility] at [facility]. He was diagnosed with Parkinson's disease and tried on different medications but had side effects, primarily GI related side effects. He has a history of cholecystectomy. Sinemet caused severe abdominal pain and nausea. He was tried on Neupro which also caused stomach pain, and on Azilect low-dose he did not think he had any response. He has been on amantadine. His  previous records were reviewed: This includes neurologic office notes from 01/10/2015, 11/08/2014, 09/09/2014, 08/19/2014, 05/20/2014, 02/18/2014, 11/30/2013, 07/06/2013, 06/01/2013, and 02/23/2013. He had a brain MRI years ago but results are not available for my review. He is currently on amantadine 100 mg twice daily. In 2015 he was tried on pramipexole but had severe GI upset. In July 2015 he reported that he had improvement with Neupro but stomach pain. In January 2016 he tried Sinemet but had to stop after a few days only. Earlier this year his Azilect was discontinued. He has no family history of Parkinson's disease. He reports mild memory loss and mild sleep issues. He currently resides in independent living at [facility] [place]. This is in [place]. His wife is a Marine scientist care. He drives. He has no recent history of falls. He tries to stay active physically. He has right knee pain and wears a soft brace around his right knee. He has a remote history of injury to his right knee. He has been off of amantadine  for Adam past 2 weeks or so. He had significant blurry vision while on it to Adam point where he had to see an ophthalmologist. His blurry vision has improved since he stopped Adam amantadine. He feels that his symptoms have been slowly progressive. He is primarily bothered by his fine motor dyscontrol and tremor. He drives without significant problems but does not drive long distances.  His Past Medical History Is Significant For: Past Medical History:  Diagnosis Date  . Cancer ([place])    skin  . Hyperlipemia   . Hypertension   . Parkinson disease ([place])   . Parkinsonism ([place]) 02/21/2015  . Spinal stenosis   . Tremor     His Past Surgical History Is Significant For: Past Surgical History:  Procedure Laterality Date  . APPENDECTOMY    . CATARACT EXTRACTION    . CHOLECYSTECTOMY    . TONSILLECTOMY    . VASECTOMY      His Family History Is Significant For: Family History  Problem  Relation Age of Onset  . Cancer Mother   . Heart disease Father     His Social History Is Significant For: Social History   Socioeconomic History  . Marital status: Married    Spouse name: Not on file  . Number of children: 3  . Years of education: PhD  . [facility] education level: Not on file  Social Needs  . Financial resource strain: Not on file  . Food insecurity - worry: Not on file  . Food insecurity - inability: Not on file  . Transportation needs - medical: Not on file  . Transportation needs - non-medical: Not on file  Occupational History  . Occupation: Retired  Tobacco Use  . Smoking status: Never Smoker  . Smokeless tobacco: Never Used  Substance and Sexual Activity  . Alcohol use: No    Alcohol/week: 0.0 oz  . Drug use: No  . Sexual activity: Not on file  Other Topics Concern  . Not on file  Social History Narrative   Denies caffeine use     His Allergies Are:  Allergies  Allergen Reactions  . Carbidopa-Levodopa Other (See Comments)    Severe stomach pains  . Oxycodone-Acetaminophen Other (See Comments)    "boils on skin"  . Tylenol [Acetaminophen]   . Pramipexole Nausea Only  :   His Current Medications Are:  Outpatient Encounter Medications as of 08/18/2017  Medication Sig  . aspirin 81 MG tablet Take 81 mg by mouth daily.  . budesonide-formoterol (SYMBICORT) 160-4.5 MCG/ACT inhaler Inhale 2 puffs into Adam lungs 2 (two) times daily.  . calcium carbonate (TUMS - DOSED IN MG ELEMENTAL CALCIUM) 500 MG chewable tablet Chew 1 tablet by mouth daily.  . Calcium Citrate 250 MG TABS Take by mouth.  . Carbidopa-Levodopa ER (RYTARY) 48.75-195 MG CPCR Take 195 mg by mouth 4 (four) times daily.  . cetirizine (ZYRTEC) 10 MG tablet Take 10 mg by mouth daily.  . finasteride (PROSCAR) 5 MG tablet Take 5 mg by mouth daily.  Marland Kitchen ipratropium (ATROVENT) 0.03 % nasal spray Place 0.03 mLs into both nostrils 3 (three) times daily as needed.  . niacin 500 MG tablet Take 500  mg by mouth every morning.   . olmesartan (BENICAR) 20 MG tablet Take 20 mg by mouth daily.  Marland Kitchen omeprazole (PRILOSEC) 40 MG capsule Take 40 mg by mouth daily.  Marland Kitchen PROAIR HFA 108 (90 Base) MCG/ACT inhaler Inhale 2 puffs into Adam lungs as needed.  . Simethicone (GAS  RELIEF PO) Take by mouth.  . simvastatin (ZOCOR) 10 MG tablet Take 10 mg by mouth daily.  . tamsulosin (FLOMAX) 0.4 MG CAPS capsule Take 0.4 mg by mouth.  . vitamin C (ASCORBIC ACID) 500 MG tablet Take 500 mg by mouth daily.   No facility-administered encounter medications on file as of 08/18/2017.   :  Review of Systems:  Out of a complete 14 point review of systems, all are reviewed and negative with Adam exception of these symptoms as listed below: Review of Systems  Neurological:       EMG Room 2, alone. Moss denies any new symptoms. Doing about Adam same as last visit. No falls since last visit per pt.    Objective:  Neurological Exam  Physical Exam Physical Examination:   Vitals:   08/18/17 1434  BP: 113/75  Pulse: (!) 101   General Examination: Adam Moss is a very pleasant 84 y.o. male in no acute distress. He appears well-developed and well-nourished and well groomed.   HEENT:Normocephalic, atraumatic, pupils are equal, round and reactive to light and accommodation. He has corrective eyeglasses. He has mild saccadic breakdown and smooth pursuit testing, no nystagmus is noted. He had cataract repair on Adam left, he has very minimal gaze limitation to upper gaze. Right eye cataract. He has a mild decrease in eye blink rate and mild facial masking. He has no significant drooling, oropharynx examination reveals mild mouth dryness, tongue protrudes centrally and palate elevates symmetrically, mild hypophonia noted.   Chest:Clear to auscultation without wheezing, rhonchi or crackles noted.  Heart:S1+S2+0, regular and normal without murmurs, rubs or gallops noted.   Abdomen:Soft, non-tender and non-distended  with normal bowel sounds appreciated on auscultation.  Extremities:There is trace edema in Adam distal lower extremities bilaterally, wearing compression stockings up to knees bilaterally.    Skin: Warm and dry without trophic changes noted.  Musculoskeletal: exam reveals no obvious joint deformities, tenderness or joint swelling or erythema.   Neurologically:  Mental status: Adam Moss is awake, alert and oriented in all 4 spheres. Hisimmediate and remote memory, attention, language skills and fund of knowledge are appropriate. There is no evidence of aphasia, agnosia, apraxia or anomia. Speech is clear with normal prosody and enunciation, mildhypophonia. Thought process is linear. Mood is normaland affect is normal.  Cranial nerves II - XII are as described above under HEENT exam. In addition:  left shoulder slightly higher than right but shoulder shrug is okay bilaterally.  Motor exam: Normal bulk, and strength for age. He has increase in tone on Adam left side. He has a mild to moderate resting tremor in Adam left upper extremity, no other resting tremor is noted today, tremor is a little worse from last time. Romberg is not tested for safety. Reflexes are 1+ in Adam upper extremities, trace in Adam lower extremities, stable, fine motor testing is moderately impaired on Adam left and mildly impaired on Adam right, mostly stable.  Cerebellar testing: No dysmetria or intention tremor. There is no truncal or gait ataxia.  Sensory exam: intact to light touch in Adam upper and lower extremities.  Gait, station and balance: [place] with mild difficulty, he pushes himself up with his hands. He needs no assistance. Posture is mildly stooped for age, stable. Balance is slightly impaired, he walks with decreased arm swing on Adam left, slightly decreased stride length and decreased pace is noted. He turns in 3 steps.   Assessment and Plan:  In summary, Claudia Alvizo a very pleasant  84 year old  malewith anunderlying medical history of degenerative spine disease, allergies, BPH, arthritis, reflux disease, and hypertension, who presents for follow-up consultation of his left-sided predominant Parkinson's disease, complicated by significant medication intolerances in Adam past, mostly secondary to GI related symptoms including stomach pain, exacerbation of reflux and nausea and vomiting. He was unable to tolerate Mirapex and Sinemet. He has been able to tolerate Rytarybrand-name, which was started at 95 mg strength, up to 2 pills 3 times a day and then we switched to 195 mg strength, 1 pill 3 times a day, then 4 times a day, with improvement in symptoms including tremor and fine motor skills. I suggested that he maintain a 4 times a day schedule and he is certainly trying to adhere to a set schedule starting around 6 AM with 4 hourly dosing. He is trying to take Adam medication away from his mealtimes and has actually noted some differences when he takes it right with mealtimes are away from his mealtimes. We mutually agreed to keep his medication regimen Adam same. He is encouraged to continue to try to stay active mentally and physically and exercise possible and stay well-hydrated with water. Of note, he had a DaT scan in August 2016 which was supportive of Parkinson's disease with decreased radiotracer uptake on Adam right.  I suggested a 6 month follow-up, sooner if needed. I renewed his prescription today. I answered all his questions today and he was in agreement.  I spent 25 minutes in total face-to-face time with Adam Moss, more than 50% of which was spent in counseling and coordination of care, reviewing test results, reviewing medication and discussing or reviewing Adam diagnosis of OSA, its prognosis and treatment options. Pertinent laboratory and imaging test results that were available during this visit with Adam Moss were reviewed by me and considered in my medical decision making (see  chart for details).

## 2017-08-22 ENCOUNTER — Other Ambulatory Visit: Payer: Self-pay

## 2017-08-22 DIAGNOSIS — G20 Parkinson's disease: Secondary | ICD-10-CM

## 2017-08-22 MED ORDER — CARBIDOPA-LEVODOPA ER 48.75-195 MG PO CPCR: 195.0000 mg | ORAL_CAPSULE | Freq: Four times a day (QID) | ORAL | 3 refills | Status: DC

## 2017-08-23 ENCOUNTER — Ambulatory Visit (INDEPENDENT_AMBULATORY_CARE_PROVIDER_SITE_OTHER): Payer: Medicare Other | Admitting: Internal Medicine

## 2017-08-23 ENCOUNTER — Encounter: Payer: Self-pay | Admitting: Internal Medicine

## 2017-08-23 VITALS — BP 114/80 | HR 101 | Ht 70.0 in | Wt 206.0 lb

## 2017-08-23 DIAGNOSIS — J449 Chronic obstructive pulmonary disease, unspecified: Secondary | ICD-10-CM | POA: Diagnosis not present

## 2017-08-23 MED ORDER — PROAIR HFA 108 (90 BASE) MCG/ACT IN AERS: 2.0000 | INHALATION_SPRAY | RESPIRATORY_TRACT | 3 refills | Status: DC | PRN

## 2017-08-23 NOTE — Addendum Note (Signed)
Addended by: Stephanie Coup on: 08/23/2017 03:38 PM   Modules accepted: Orders

## 2017-08-23 NOTE — Progress Notes (Signed)
   Name: Adam Moss MRN: [id number] DOB: [birth year]     CONSULTATION DATE: 08/23/2017   REFERRING MD : Pryor Ochoa  CHIEF COMPLAINT:  Cough  STUDIES:  CXR 05/2017 I have Independently reviewed images of  CXR  on 08/23/2017 Interpretation: no acute findings No opacities  PFT1.22.19 NL ratio Fev1 2.4L 80%predicted Fef25/75 1.7L 58% predicted Flow volume loops suggest obstructive pattern  PFT's reviewed with patient  HISTORY OF PRESENT ILLNESS:  Follow up Cough Patient feels much better since starting symbicort but has developed hoarse voice He feels well overall   There is no signs of infection at this time No fevers no chills Patient states that appetite is good Patient lives in the [place] his wife died last year Patient does not have any chest pain Patient has intermittent shortness of breath and dyspnea on exertion Patient is a non-smoker however has significant secondhand smoke exposure for 25 years   REVIEW OF SYSTEMS:   Constitutional: Negative for fever, chills, weight loss, malaise/fatigue and diaphoresis. +hoarse voice HENT: Negative for hearing loss, ear pain, nosebleeds, congestion, sore throat, neck pain, tinnitus and ear discharge.   Eyes: Negative for blurred vision, double vision, photophobia, pain, discharge and redness.  Respiratory: - cough, hemoptysis, sputum production, -shortness of breath,- wheezing and stridor.   ALL OTHER ROS ARE NEGATIVE    BP 114/80 (BP Location: Left Arm, Cuff Size: Normal)   Pulse (!) 101   Ht 5\' 10"  (1.778 m)   Wt 206 lb (93.4 kg)   SpO2 92%   BMI 29.56 kg/m   Physical Examination:   GENERAL:NAD, no fevers, chills, no weakness no fatigue HEAD: Normocephalic, atraumatic.  EYES: Pupils equal, round, reactive to light. Extraocular muscles intact. No scleral icterus.  MOUTH: Moist mucosal membrane.   EAR, NOSE, THROAT: Clear without exudates. No external lesions.  NECK: Supple. No thyromegaly. No  nodules. No JVD.  PULMONARY:CTA B/L no wheezes, no crackles, no rhonchi CARDIOVASCULAR: S1 and S2. Regular rate and rhythm. No murmurs, rubs, or gallops. No edema.  GASTROINTESTINAL: Soft, nontender, nondistended. No masses. Positive bowel sounds.  MUSCULOSKELETAL: No swelling, clubbing, or edema. Range of motion full in all extremities.  SKIN: No ulceration, lesions, rashes, or cyanosis. Skin warm and dry. Turgor intact.  PSYCHIATRIC: Mood, affect within normal limits. The patient is awake, alert and oriented x 3. Insight, judgment intact.      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  At this time I would recommend using Symbicort 2 puffs twice a day as needed also to use his albuterol inhaler as needed  1.Symbicort 2 puffs twice as needed helps, stop using daily due to hoarse voice 2.Albuterol 2-4 puffs as needed for wheezing 3.Continue cough suppressant with Robitussin DM as needed 4.no indication for antibiotics or prednisone therapy at this time  Patient  satisfied with Plan of action and management. All questions answered    Corrin Parker, M.D.  [facility]  Medical Director [place] Director [facility] Cardio-Pulmonary Department

## 2017-08-23 NOTE — Patient Instructions (Signed)
Use symbicort as needed Use albuterol as needed Start Flonase

## 2017-12-08 DIAGNOSIS — E782 Mixed hyperlipidemia: Secondary | ICD-10-CM | POA: Diagnosis not present

## 2017-12-08 DIAGNOSIS — K219 Gastro-esophageal reflux disease without esophagitis: Secondary | ICD-10-CM | POA: Diagnosis not present

## 2017-12-08 DIAGNOSIS — R5383 Other fatigue: Secondary | ICD-10-CM | POA: Diagnosis not present

## 2017-12-08 DIAGNOSIS — Z125 Encounter for screening for malignant neoplasm of prostate: Secondary | ICD-10-CM | POA: Diagnosis not present

## 2017-12-08 DIAGNOSIS — I10 Essential (primary) hypertension: Secondary | ICD-10-CM | POA: Diagnosis not present

## 2017-12-08 DIAGNOSIS — Z0000 Encounter for general adult medical examination without abnormal findings: Secondary | ICD-10-CM | POA: Diagnosis not present

## 2017-12-08 DIAGNOSIS — N401 Enlarged prostate with lower urinary tract symptoms: Secondary | ICD-10-CM | POA: Diagnosis not present

## 2018-02-06 DIAGNOSIS — D2262 Melanocytic nevi of left upper limb, including shoulder: Secondary | ICD-10-CM | POA: Diagnosis not present

## 2018-02-06 DIAGNOSIS — Z08 Encounter for follow-up examination after completed treatment for malignant neoplasm: Secondary | ICD-10-CM | POA: Diagnosis not present

## 2018-02-06 DIAGNOSIS — D2261 Melanocytic nevi of right upper limb, including shoulder: Secondary | ICD-10-CM | POA: Diagnosis not present

## 2018-02-06 DIAGNOSIS — X32XXXA Exposure to sunlight, initial encounter: Secondary | ICD-10-CM | POA: Diagnosis not present

## 2018-02-06 DIAGNOSIS — D225 Melanocytic nevi of trunk: Secondary | ICD-10-CM | POA: Diagnosis not present

## 2018-02-06 DIAGNOSIS — Z85828 Personal history of other malignant neoplasm of skin: Secondary | ICD-10-CM | POA: Diagnosis not present

## 2018-02-06 DIAGNOSIS — D2272 Melanocytic nevi of left lower limb, including hip: Secondary | ICD-10-CM | POA: Diagnosis not present

## 2018-02-06 DIAGNOSIS — L570 Actinic keratosis: Secondary | ICD-10-CM | POA: Diagnosis not present

## 2018-02-06 DIAGNOSIS — L821 Other seborrheic keratosis: Secondary | ICD-10-CM | POA: Diagnosis not present

## 2018-02-06 DIAGNOSIS — D2271 Melanocytic nevi of right lower limb, including hip: Secondary | ICD-10-CM | POA: Diagnosis not present

## 2018-02-14 ENCOUNTER — Ambulatory Visit (INDEPENDENT_AMBULATORY_CARE_PROVIDER_SITE_OTHER): Payer: Medicare Other | Admitting: Internal Medicine

## 2018-02-14 ENCOUNTER — Encounter: Payer: Self-pay | Admitting: Internal Medicine

## 2018-02-14 VITALS — BP 112/72 | HR 88 | Ht 70.0 in | Wt 201.0 lb

## 2018-02-14 DIAGNOSIS — J4520 Mild intermittent asthma, uncomplicated: Secondary | ICD-10-CM | POA: Diagnosis not present

## 2018-02-14 DIAGNOSIS — J449 Chronic obstructive pulmonary disease, unspecified: Secondary | ICD-10-CM | POA: Diagnosis not present

## 2018-02-14 MED ORDER — TIOTROPIUM BROMIDE MONOHYDRATE 1.25 MCG/ACT IN AERS: 2.0000 | INHALATION_SPRAY | RESPIRATORY_TRACT | 6 refills | Status: DC

## 2018-02-14 NOTE — Progress Notes (Signed)
   Name: Virginio Isidore MRN: [id number] DOB: [birth year]     CONSULTATION DATE: 02/14/2018   REFERRING MD : Pryor Ochoa  CHIEF COMPLAINT:  Cough  STUDIES:  CXR 05/2017 I have Independently reviewed images of  CXR  on 02/14/2018 Interpretation: no acute findings No opacities  PFT1.22.19 NL ratio Fev1 2.4L 80%predicted Fef25/75 1.7L 58% predicted Flow volume loops suggest obstructive pattern  PFT's reviewed with patient  HISTORY OF PRESENT ILLNESS:  Follow up SOB/DOE Stopped  symbicort - has developed hoarse voice Stopped Symbicort He feels well overall Intermittent wheezing Uses atrovent nasal sprays   There is no signs of infection at this time No fevers no chills Patient states that appetite is good Patient lives in the [place] his wife died last year Patient does not have any chest pain Patient has intermittent shortness of breath and dyspnea on exertion Patient is a non-smoker however has significant secondhand smoke exposure for 25 years   REVIEW OF SYSTEMS:   Constitutional: Negative for fever, chills, weight loss, malaise/fatigue and diaphoresis. +hoarse voice HENT: Negative for hearing loss, ear pain, nosebleeds, congestion, sore throat, neck pain, tinnitus and ear discharge.   Eyes: Negative for blurred vision, double vision, photophobia, pain, discharge and redness.  Respiratory: - cough, hemoptysis, sputum production, +shortness of breath,- wheezing and stridor.   ALL OTHER ROS ARE NEGATIVE    BP 112/72 (BP Location: Left Arm, Cuff Size: Normal)   Pulse 88   Ht 5\' 10"  (1.778 m)   Wt 201 lb (91.2 kg)   SpO2 99%   BMI 28.84 kg/m   Physical Examination:  GENERAL:NAD, no fevers, chills, no weakness no fatigue HEAD: Normocephalic, atraumatic.  EYES: Pupils equal, round, reactive to light. Extraocular muscles intact. No scleral icterus.  MOUTH: Moist mucosal membrane.   EAR, NOSE, THROAT: Clear without exudates. No external lesions.  NECK:  Supple. No thyromegaly. No nodules. No JVD.  PULMONARY:CTA B/L no wheezes, no crackles, no rhonchi CARDIOVASCULAR: S1 and S2. Regular rate and rhythm. No murmurs, rubs, or gallops. No edema.  GASTROINTESTINAL: Soft, nontender, nondistended. No masses. Positive bowel sounds.  MUSCULOSKELETAL: No swelling, clubbing, or edema. Range of motion full in all extremities.  SKIN: No ulceration, lesions, rashes, or cyanosis. Skin warm and dry. Turgor intact.  PSYCHIATRIC: Mood, affect within normal limits. The patient is awake, alert and oriented x 3. Insight, judgment intact.        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  At this time I would recommend starting SPiriva respimat as  Symbicort caused sore throat   1.Spiriva  2 puffs daily  2.Albuterol 2-4 puffs as needed for wheezing 3.Continue cough suppressant with Robitussin DM as needed 4.no indication for antibiotics or prednisone therapy at this time  Patient  satisfied with Plan of action and management. All questions answered  Follow up in 6 months   Jamella Grayer Patricia Pesa, M.D.  [facility]  Medical Director [place] Director [facility] Cardio-Pulmonary Department

## 2018-02-14 NOTE — Patient Instructions (Signed)
START SPIRIVA RESPIMAT 1.25 USE AS DIRECTED

## 2018-02-15 ENCOUNTER — Ambulatory Visit (INDEPENDENT_AMBULATORY_CARE_PROVIDER_SITE_OTHER): Payer: Medicare Other | Admitting: Neurology

## 2018-02-15 ENCOUNTER — Encounter: Payer: Self-pay | Admitting: Neurology

## 2018-02-15 VITALS — BP 106/67 | HR 95 | Ht 70.5 in | Wt 199.0 lb

## 2018-02-15 DIAGNOSIS — M25471 Effusion, right ankle: Secondary | ICD-10-CM

## 2018-02-15 DIAGNOSIS — M25472 Effusion, left ankle: Secondary | ICD-10-CM | POA: Diagnosis not present

## 2018-02-15 DIAGNOSIS — G20 Parkinson's disease: Secondary | ICD-10-CM

## 2018-02-15 NOTE — Patient Instructions (Addendum)
Let's keep your Rytary the same. You are up to date with your prescription.   Constipation can be a big problem in advancing Parkinson's disease. It is important to be proactive: this includes ensuring adequate water intake and mobilization (walking around), utilizing stool softeners as needed, an over-the-counter laxative as needed up to daily if needed, adding a probiotic in pill form or in the form of yogurt can help as well. Sometimes, using a suppository or enema becomes necessary.

## 2018-02-15 NOTE — Progress Notes (Signed)
Subjective:    Patient ID: Adam Moss is a 84 y.o. male.  HPI     Interim history:   Adam Moss is a very pleasant 84 year old left-handed gentleman with an underlying medical history of degenerative spine disease, allergies, BPH, hypertension, arthritis, reflux disease, and insomnia, who presents for follow-up consultation of his Parkinson's disease. The patient is unaccompanied today. I last saw him on 08/18/2017, at which time he felt stable. We continued with his Rytary, 195 mg qid.   Today, 02/15/2018 (all dictated new, as well as above notes, some dictation done in note pad or Word, outside of chart, may appear as copied):  He reports doing okay, stable. Thankfully, no falls. Has occasional constipation, mild forgetfulness, likes to play sudoku and solitaire or Word find puzzles. Has some participation in social events at his retirement community. Son lives about 10 minutes away, has teenage GC.    The patient's allergies, current medications, family history, past medical history, past social history, past surgical history and problem list were reviewed and updated as appropriate.    Previously (copied from previous notes for reference):  I saw him on 04/18/17, at which time he reported worsening tremor, he was having some difficulty with fine motor control. He was taking Rytary 4 times a day and at bedtime. He was trying to keep set schedule for his bedtime routine and wake up routine and medication regimen. I suggested he change the timing of his medication to 1 pill at 6, 11, 4 PM and 10 PM. We talked about potentially increasing the medication to 1 pill 5 times a day.   I saw him on 10/13/2016, at which time he reported doing okay. He had occasional muscle pain in different areas of his lower back and also thighs, sometimes around the ankles. He was trying to walk on a regular basis. Memory mood were stable. Sadly, his wife passed away in 07/13/2016. I suggested, we keep his  meds the same.     We had to cancel an appointment for 06/08/2016 and he missed an appointment on 07/21/2016. I saw him on 02/04/2016, at which time he reported doing okay, tolerating Rytary 195 mg qid without significant side effects. He was not always exercising regularly. He does visit his wife in the afternoon every day. He was not always hydrating well enough. He had gained some weight. He had no recent falls, no complaints of hallucinations or memory issues, occasional depressed mood but no sustained symptoms of depression. I suggested we continue with his medication regimen. He was encouraged to hydrate better and exercise on a more regular basis.   I saw him on 09/24/2015, at which time he reported doing a little better, able to tolerate the Rytary qid. Thankfully, h had no recent falls. He was not sleeping very well at night. He would visit his wife in memory care every day. His 3 children with visit about once a month. Is trying to hydrate well. He had noticed some ankle swelling and residual knee pain bilaterally. He was able to pursue his hobby of building model ships. I suggested he continue with Rytary at the current dose. He was encouraged to try melatonin for sleep.    I saw him on 05/26/2015, at which time he reported doing somewhat better with the new medication, Rytary, with improvement noted in fine motor skills and tremors. He had recent blood work through his primary care physician which I reviewed at the time: Lipid profile was unremarkable with  the exception of triglycerides borderline at 155, BMP was normal and CBC was normal. He reported that his leg swelling was a little better. He was avoiding nighttime driving. He was visiting his wife and memory care regularly. He was able to tolerate the new medication which was really reassuring. She was taking Rytary 195 mg, one pill at 9 AM, 1 pill at 5 PM, 1 pill at bedtime which is usually around midnight. He had gained about 10 pounds and  was trying to lose weight. He worried about his weight gain but did admit to having good food and lots of choices for dessert at his assisted living place. I suggested a gentle increase in his medication to 1 pill 4 times a day.   I first met him on 02/21/2015 at the request of his primary neurologist, at which time the patient reported a history of left-sided tremor, fine motor dyscontrol, and gait difficulties. Symptoms dated back to 2-3 years prior. He had multiple medication intolerances particularly issues with GI side effects. I suggested a trial of Rytary, 95 mg strength with titration to 2 pills 3 times a day. I also suggested we proceed with a DaT scan and I referred him to [facility] nuclear medicine. He had the study on 03/19/2015 which showed abnormal image grade 1: Asymmetric uptake with normal or almost normal putamen activity in one hemisphere and with a more marked reduction in the contralateral putamen. This pattern of activity can be seen with Parkinson's disease or related syndromes.    Of note, the radiotracer uptake was decreased on the right. We called the patient with the test results. The patient emailed me in August reporting a slight decrease in his tremor with a new medication and ability to tolerated thus far. He asked for change and pill strength of possible because the medication is very expensive. I changed him to 195 mg strength one pill 3 times a day.    02/21/2015: He has a history of left-sided tremor, fine motor dyscontrol, and gait difficulty. 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 Symptoms date back to 2014 or the year before, when he started having difficulty getting out of a chair, tremors on the left side, slowness, and he started seeing you at the neuroscience center in [facility]. He was diagnosed with Parkinson's disease and  tried on different medications but had side effects, primarily GI related side effects. He has a history of cholecystectomy. Sinemet caused severe abdominal pain and nausea. He was tried on Neupro which also caused stomach pain, and on Azilect low-dose he did not think he had any response. He has been on amantadine. His previous records were reviewed: This includes neurologic office notes from 01/10/2015, 11/08/2014, 09/09/2014, 08/19/2014, 05/20/2014, 02/18/2014, 11/30/2013, 07/06/2013, 06/01/2013, and 02/23/2013. He had a brain MRI years ago but results are not available for my review. He is currently on amantadine 100 mg twice daily. In 2015 he was tried on pramipexole but had severe GI upset. In July 2015 he reported that he had improvement with Neupro but stomach pain. In January 2016 he tried Sinemet but had to stop after a few days only. Earlier this year his Azilect was discontinued. He has no family history of Parkinson's disease. He reports mild memory loss and mild sleep issues. He currently resides in independent living at [facility] [place].  This is in [place]. His wife is a Marine scientist care. He drives. He has no recent history of falls. He tries to stay active physically. He has right knee pain and wears a soft brace around his right knee. He has a remote history of injury to his right knee. He has been off of amantadine for the past 2 weeks or so. He had significant blurry vision while on it to the point where he had to see an ophthalmologist. His blurry vision has improved since he stopped the amantadine. He feels that his symptoms have been slowly progressive. He is primarily bothered by his fine motor dyscontrol and tremor. He drives without significant problems but does not drive long distances.  His Past Medical History Is Significant For: Past Medical History:  Diagnosis Date  . Cancer ([place])    skin  . Hyperlipemia   . Hypertension   . Parkinson disease ([place])   . Parkinsonism  ([place]) 02/21/2015  . Spinal stenosis   . Tremor     His Past Surgical History Is Significant For: Past Surgical History:  Procedure Laterality Date  . APPENDECTOMY    . CATARACT EXTRACTION    . CHOLECYSTECTOMY    . TONSILLECTOMY    . VASECTOMY      His Family History Is Significant For: Family History  Problem Relation Age of Onset  . Cancer Mother   . Heart disease Father     His Social History Is Significant For: Social History   Socioeconomic History  . Marital status: Married    Spouse name: Not on file  . Number of children: 3  . Years of education: PhD  . Highest education level: Not on file  Occupational History  . Occupation: Retired  Scientific laboratory technician  . Financial resource strain: Not on file  . Food insecurity:    Worry: Not on file    Inability: Not on file  . Transportation needs:    Medical: Not on file    Non-medical: Not on file  Tobacco Use  . Smoking status: Never Smoker  . Smokeless tobacco: Never Used  Substance and Sexual Activity  . Alcohol use: No    Alcohol/week: 0.0 oz  . Drug use: No  . Sexual activity: Not on file  Lifestyle  . Physical activity:    Days per week: Not on file    Minutes per session: Not on file  . Stress: Not on file  Relationships  . Social connections:    Talks on phone: Not on file    Gets together: Not on file    Attends religious service: Not on file    Active member of club or organization: Not on file    Attends meetings of clubs or organizations: Not on file    Relationship status: Not on file  Other Topics Concern  . Not on file  Social History Narrative   Denies caffeine use     His Allergies Are:  Allergies  Allergen Reactions  . Carbidopa-Levodopa Other (See Comments)    Severe stomach pains  . Iodinated Diagnostic Agents     Pt is unsure of why this allergy is listed.  . Oxycodone-Acetaminophen Other (See Comments)    "boils on skin"  . Tylenol [Acetaminophen]   . Tyloxapol   . Pramipexole  Nausea Only  :   His Current Medications Are:  Outpatient Encounter Medications as of 02/15/2018  Medication Sig  . aspirin 81 MG tablet Take 81 mg by mouth daily.  Marland Kitchen  calcium carbonate (TUMS - DOSED IN MG ELEMENTAL CALCIUM) 500 MG chewable tablet Chew 1 tablet by mouth daily.  . Calcium Citrate 250 MG TABS Take by mouth.  . Carbidopa-Levodopa ER (RYTARY) 48.75-195 MG CPCR Take 195 mg by mouth 4 (four) times daily.  . cetirizine (ZYRTEC) 10 MG tablet Take 10 mg by mouth daily.  . finasteride (PROSCAR) 5 MG tablet Take 5 mg by mouth daily.  Marland Kitchen ipratropium (ATROVENT) 0.03 % nasal spray Place 0.03 mLs into both nostrils 3 (three) times daily as needed.  . niacin 500 MG tablet Take 500 mg by mouth every morning.   . olmesartan (BENICAR) 20 MG tablet Take 20 mg by mouth daily.  Marland Kitchen omeprazole (PRILOSEC) 40 MG capsule Take 40 mg by mouth daily.  Marland Kitchen PROAIR HFA 108 (90 Base) MCG/ACT inhaler Inhale 2 puffs into the lungs as needed.  . Simethicone (GAS RELIEF PO) Take by mouth.  . simvastatin (ZOCOR) 10 MG tablet Take 10 mg by mouth daily.  . tamsulosin (FLOMAX) 0.4 MG CAPS capsule Take 0.4 mg by mouth.  . Tiotropium Bromide Monohydrate (SPIRIVA RESPIMAT) 1.25 MCG/ACT AERS Inhale 2 puffs into the lungs 1 day or 1 dose for 1 dose.  . vitamin C (ASCORBIC ACID) 500 MG tablet Take 500 mg by mouth daily.  . budesonide-formoterol (SYMBICORT) 160-4.5 MCG/ACT inhaler Inhale 2 puffs into the lungs 2 (two) times daily.   No facility-administered encounter medications on file as of 02/15/2018.   :  Review of Systems:  Out of a complete 14 point review of systems, all are reviewed and negative with the exception of these symptoms as listed below:  Review of Systems  Neurological:       Pt presents today to discuss his PD. Pt reports that he has had a lot of respiratory issues.    Objective:  Neurological Exam  Physical Exam Physical Examination:   Vitals:   02/15/18 1410  BP: 106/67  Pulse: 95     General Examination: The patient is a very pleasant 84 y.o. male in no acute distress. He appears well-developed and well-nourished and well groomed.   HEENT:Normocephalic, atraumatic, pupils are equal, round and reactive to light and accommodation. He has corrective eyeglasses. He has mild saccadic breakdown on smooth pursuit testing, no nystagmus is noted. He had cataract repair on the left, mild decrease in eye blink rate and mild facial masking. He has no significant drooling, oropharynx examination reveals mild mouth dryness, tongue protrudes centrally and palate elevates symmetrically, mild hypophonia noted.  Chest:Clear to auscultation without wheezing, rhonchi or crackles noted.  Heart:S1+S2+0, regular and normal without murmurs, rubs or gallops noted.   Abdomen:Soft, non-tender and non-distended with normal bowel sounds appreciated on auscultation.  Extremities:There istraceedema in the distal lower extremities bilaterally,wearing compression stockings up to knees bilaterally.  Skin: Warm and dry without trophic changes noted.  Musculoskeletal: exam reveals no obvious joint deformities, tenderness or joint swelling or erythema.   Neurologically:  Mental status: The patient is awake, alert and oriented in all 4 spheres. Hisimmediate and remote memory, attention, language skills and fund of knowledge are appropriate. There is no evidence of aphasia, agnosia, apraxia or anomia. Speech is clear with normal prosody and enunciation, mildhypophonia. Thought process is linear. Mood is normaland affect is normal.  Cranial nerves II - XII are as described above under HEENT exam. In addition:  left shoulder slightly higher than right, stable.   Motor exam: Normal bulk, and strength for age. He has increase  in tone on the left side. He has a more moderate resting tremor in the left upper extremity, no other resting tremor is noted today, tremor is a little worse from last  time. Romberg is not tested for safety reasons. Reflexes are 1+ in the upper extremities, trace in the lower extremities, stable. Fine motor testing is moderately impaired on the left and mild to moderately impaired on the right, stable.  Cerebellar testing: No dysmetria or intention tremor. There is no truncal or gait ataxia.  Sensory exam: intact to light touch in the upper and lower extremities.  Gait, station and balance: [place] with mild difficulty, he pushes himself up with his hands. He needs no assistance. Posture is mildly stooped, fairly stable. Balance is slightly impaired, he walks with decreased arm swing on the left, slightly decreased stride length and decreased pace is noted. He turns in 3 steps.   Assessment and Plan:  In summary, Adam Moss a very pleasant 84 year old malewith anunderlying medical history of degenerative spine disease, allergies, BPH, arthritis, reflux disease, and hypertension, who presents for follow-up consultation of his left-sided predominant Parkinson's disease, complicated by significant medication intolerancesin the past, mostly secondary to GI related symptoms including stomach pain, exacerbation of reflux and nausea and vomiting. He was unable to tolerate Mirapex and Sinemet in the past. Fortunately, he has been able to tolerate Rytarybrand-name, which was started at 95 mg strength, up to 2 pills 3 times a day and then we switched to 195 mg strength, 1 pill 3 times a day, then 4 times a day, with improvement in symptoms including tremor and fine motor skills. I suggested that he maintaina4 times a day schedule and he is certainly trying to adhere to a set schedule starting around 6 AM with 4 to 6 hourly dosing. He is trying to take the medication away from his mealtimes and has actually noted some differences when he takes it right with mealtimes or away from his mealtimes. We mutually agreed to keep his medication regimen the same. He is  encouraged to continue to try to stay active mentally and physically and exercise in the form of walking daily. He is advised to stay well-hydrated with water and be proactive about constipation issues. Of note, he had a DaT scan in August 2016 which was supportive of Parkinson's disease with decreased radiotracer uptake on the right.Suggested a six-month follow-up. His prescription was up to date. I answered all his questions today and he was in agreement. I spent 25 minutes in total face-to-face time with the patient, more than 50% of which was spent in counseling and coordination of care, reviewing test results, reviewing medication and discussing or reviewing the diagnosis of PD, its prognosis and treatment options. Pertinent laboratory and imaging test results that were available during this visit with the patient were reviewed by me and considered in my medical decision making (see chart for details).

## 2018-02-22 ENCOUNTER — Ambulatory Visit: Payer: Federal, State, Local not specified - PPO | Admitting: Internal Medicine

## 2018-02-24 ENCOUNTER — Other Ambulatory Visit: Payer: Self-pay

## 2018-03-30 ENCOUNTER — Other Ambulatory Visit: Payer: Self-pay | Admitting: Internal Medicine

## 2018-05-02 ENCOUNTER — Ambulatory Visit: Payer: Federal, State, Local not specified - PPO | Admitting: Internal Medicine

## 2018-05-02 DIAGNOSIS — H2511 Age-related nuclear cataract, right eye: Secondary | ICD-10-CM | POA: Diagnosis not present

## 2018-05-02 DIAGNOSIS — H04123 Dry eye syndrome of bilateral lacrimal glands: Secondary | ICD-10-CM | POA: Diagnosis not present

## 2018-05-02 DIAGNOSIS — H35033 Hypertensive retinopathy, bilateral: Secondary | ICD-10-CM | POA: Diagnosis not present

## 2018-05-02 DIAGNOSIS — H35372 Puckering of macula, left eye: Secondary | ICD-10-CM | POA: Diagnosis not present

## 2018-05-02 NOTE — Progress Notes (Signed)
   Name: Adam Moss MRN: [id number] DOB: 12-06-1933      CHIEF COMPLAINT:  Cough  STUDIES:  CXR 05/2017 I have Independently reviewed images of  CXR  on 05/02/2018 Interpretation: no acute findings No opacities  PFT1.22.19 NL ratio Fev1 2.4L 80%predicted Fef25/75 1.7L 58% predicted Flow volume loops suggest obstructive pattern    HISTORY OF PRESENT ILLNESS:  The patient is an 84 year old male with a history of mild COPD.  He usually sees Dr. Mortimer Fries at last visit his Symbicort was stopped due to hoarseness of voice, this was changed to Spiriva.  He was asked to continue cough suppressant, albuterol as needed.  Today he is taking spiriva respimat 2 puffs once daily, he takes flonase once spray in each nostril once daily. He uses ipratropium nasal spray 3 or 4 times per day which seems to help his is nasal drainage. He is having a lot of coughing which is dry, and he has right rib pain which is made worse by coughing.  No pets, has has reflux controlled with omeprazole.  He was demonstrated use of Brio inhaler.  He is going to get his flu shot from his senior living facility.   Patient states that appetite is good Patient lives in the [facility] his wife died last year   Review of Systems:  Constitutional: Feels well. Cardiovascular: Denies chest pain, exertional chest pain.  Pulmonary: Denies hemoptysis, pleuritic chest pain.   The remainder of systems were reviewed and were found to be negative other than what is documented in the HPI.    Physical Examination:   VS: BP 112/72 (BP Location: Left Arm, Cuff Size: Large)   Pulse 94   Resp 16   Ht 5' 10.5" (1.791 m)   Wt 199 lb (90.3 kg)   SpO2 96%   BMI 28.15 kg/m   General Appearance: No distress  Neuro:without focal findings, mental status, speech normal, alert and oriented HEENT: PERRLA, EOM intact Pulmonary: No wheezing, No rales  CardiovascularNormal S1,S2.  No m/r/g.  Abdomen: Benign, Soft,  non-tender, No masses Renal:  No costovertebral tenderness  GU:  No performed at this time. Endoc: No evident thyromegaly, no signs of acromegaly or Cushing features Skin:   warm, no rashes, no ecchymosis  Extremities: normal, no cyanosis, clubbing.      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  Acute on chronic cough, suspect postnasal drip, versus asthma/COPD.  1.change Spiriva to Brio 100.  He is given a sample asked to use it for the next week, if it helps he can fill the prescription that was sent. 2.continue albuterol.  Albuterol 2-4 puffs as needed for wheezing 3.continue cough suppressant with Robitussin DM as needed 4.Will change Flonase to Dymista, this is to be used in addition to nasal ipratropium.  Follow-up with Dr. Mortimer Fries in 4 to 6 weeks.  Marda Stalker, M.D., F.C.C.P.  Board Certified in Internal Medicine, Pulmonary Medicine, [place], and Sleep Medicine.  [facility] Office Number: [phone]

## 2018-05-03 ENCOUNTER — Ambulatory Visit (INDEPENDENT_AMBULATORY_CARE_PROVIDER_SITE_OTHER): Payer: Medicare Other | Admitting: Internal Medicine

## 2018-05-03 ENCOUNTER — Encounter: Payer: Self-pay | Admitting: Internal Medicine

## 2018-05-03 VITALS — BP 112/72 | HR 94 | Resp 16 | Ht 70.5 in | Wt 199.0 lb

## 2018-05-03 DIAGNOSIS — J4520 Mild intermittent asthma, uncomplicated: Secondary | ICD-10-CM | POA: Diagnosis not present

## 2018-05-03 DIAGNOSIS — J449 Chronic obstructive pulmonary disease, unspecified: Secondary | ICD-10-CM | POA: Diagnosis not present

## 2018-05-03 DIAGNOSIS — R05 Cough: Secondary | ICD-10-CM

## 2018-05-03 DIAGNOSIS — R059 Cough, unspecified: Secondary | ICD-10-CM

## 2018-05-03 MED ORDER — AZELASTINE-FLUTICASONE 137-50 MCG/ACT NA SUSP: 2.0000 | Freq: Every day | NASAL | 3 refills | Status: DC

## 2018-05-03 MED ORDER — FLUTICASONE FUROATE-VILANTEROL 100-25 MCG/INH IN AEPB: 1.0000 | INHALATION_SPRAY | Freq: Every day | RESPIRATORY_TRACT | 0 refills | Status: DC

## 2018-05-03 NOTE — Patient Instructions (Addendum)
Start dymista nasal spray in place of flonase.  Will change spiriva to Breo 100 once daily, rinse mouth after use. Will send in prescription to use if it helps.

## 2018-05-03 NOTE — Addendum Note (Signed)
Addended by: Stephanie Coup on: 05/03/2018 10:18 AM   Modules accepted: Orders

## 2018-05-11 ENCOUNTER — Other Ambulatory Visit: Payer: Self-pay | Admitting: Internal Medicine

## 2018-05-11 MED ORDER — FLUTICASONE FUROATE-VILANTEROL 100-25 MCG/INH IN AEPB: 1.0000 | INHALATION_SPRAY | Freq: Every day | RESPIRATORY_TRACT | 3 refills | Status: DC

## 2018-05-11 NOTE — Telephone Encounter (Signed)
Pt was changed from Spiriva to Breo 100. He is requesting rx be sent to pharmacy.

## 2018-05-17 DIAGNOSIS — Z23 Encounter for immunization: Secondary | ICD-10-CM | POA: Diagnosis not present

## 2018-06-09 DIAGNOSIS — E782 Mixed hyperlipidemia: Secondary | ICD-10-CM | POA: Diagnosis not present

## 2018-06-09 DIAGNOSIS — K219 Gastro-esophageal reflux disease without esophagitis: Secondary | ICD-10-CM | POA: Diagnosis not present

## 2018-06-09 DIAGNOSIS — I10 Essential (primary) hypertension: Secondary | ICD-10-CM | POA: Diagnosis not present

## 2018-06-09 DIAGNOSIS — N401 Enlarged prostate with lower urinary tract symptoms: Secondary | ICD-10-CM | POA: Diagnosis not present

## 2018-06-12 DIAGNOSIS — R7309 Other abnormal glucose: Secondary | ICD-10-CM | POA: Diagnosis not present

## 2018-06-14 ENCOUNTER — Encounter: Payer: Self-pay | Admitting: Internal Medicine

## 2018-06-14 ENCOUNTER — Ambulatory Visit (INDEPENDENT_AMBULATORY_CARE_PROVIDER_SITE_OTHER): Payer: Medicare Other | Admitting: Internal Medicine

## 2018-06-14 VITALS — BP 110/70 | HR 91 | Ht 70.5 in

## 2018-06-14 DIAGNOSIS — J449 Chronic obstructive pulmonary disease, unspecified: Secondary | ICD-10-CM | POA: Diagnosis not present

## 2018-06-14 MED ORDER — FLUTICASONE FUROATE-VILANTEROL 100-25 MCG/INH IN AEPB: 1.0000 | INHALATION_SPRAY | Freq: Every day | RESPIRATORY_TRACT | 0 refills | Status: DC

## 2018-06-14 NOTE — Addendum Note (Signed)
Addended by: Darreld Mclean on: 06/14/2018 11:56 AM   Modules accepted: Orders

## 2018-06-14 NOTE — Progress Notes (Signed)
Name: Adam Moss MRN: [id number] DOB: September 01, 1933     CONSULTATION DATE: 06/14/2018   REFERRING MD : Pryor Ochoa  CHIEF COMPLAINT:  Cough  STUDIES:  CXR 05/2017 I have Independently reviewed images of  CXR   Interpretation: no acute findings No opacities  PFT1.22.19 NL ratio Fev1 2.4L 80%predicted Fef25/75 1.7L 58% predicted Flow volume loops suggest obstructive pattern  PFT's reviewed with patient  HISTORY OF PRESENT ILLNESS:  Follow up SOB/DOE Stopped  symbicort - has developed hoarse voice He was started on Spiriva but did not help  6-weeks ago was started on Breo 100 This has helped tremendously His albuterol use declined instantly  Patient feels well overall with his shortness of breath Uses Atrovent nasal sprays  No signs of infection Good appetite No weight loss Non-smoker No chest pain at this time    Review of Systems:  Gen:  Denies  fever, sweats, chills weigh loss  HEENT: Denies blurred vision, double vision, ear pain, eye pain, hearing loss, nose bleeds, sore throat Cardiac:  No dizziness, chest pain or heaviness, chest tightness,edema, No JVD Resp:   No cough, -sputum production, +shortness of breath,-wheezing, -hemoptysis,  Gi: Denies swallowing difficulty, stomach pain, nausea or vomiting, diarrhea, constipation, bowel incontinence Gu:  Denies bladder incontinence, burning urine Ext:   Denies Joint pain, stiffness or swelling Skin: Denies  skin rash, easy bruising or bleeding or hives Endoc:  Denies polyuria, polydipsia , polyphagia or weight change Psych:   Denies depression, insomnia or hallucinations  Other:  All other systems negative  Ht 5' 10.5" (1.791 m)   BMI 28.15 kg/m  BP 110/70 (BP Location: Left Arm, Cuff Size: Normal)   Pulse 91   Ht 5' 10.5" (1.791 m)   SpO2 97%   BMI 28.15 kg/m     Physical Examination:   GENERAL:NAD, no fevers, chills, no weakness no fatigue HEAD: Normocephalic, atraumatic.  EYES: Pupils  equal, round, reactive to light. Extraocular muscles intact. No scleral icterus.  MOUTH: Moist mucosal membrane. Dentition intact. No abscess noted.  EAR, NOSE, THROAT: Clear without exudates. No external lesions.  NECK: Supple. No thyromegaly. No nodules. No JVD.  PULMONARY: CTA B/L no wheezing, rhonchi, crackles CARDIOVASCULAR: S1 and S2. Regular rate and rhythm. No murmurs, rubs, or gallops. No edema. Pedal pulses 2+ bilaterally.  GASTROINTESTINAL: Soft, nontender, nondistended. No masses. Positive bowel sounds. No hepatosplenomegaly.  MUSCULOSKELETAL: No swelling, clubbing, or edema. Range of motion full in all extremities.  NEUROLOGIC: Cranial nerves II through XII are intact. No gross focal neurological deficits. Sensation intact. Reflexes intact.  SKIN: No ulceration, lesions, rashes, or cyanosis. Skin warm and dry. Turgor intact.  PSYCHIATRIC: Mood, affect within normal limits. The patient is awake, alert and oriented x 3. Insight, judgment intact.  ALL OTHER ROS ARE NEGATIVE         ASSESSMENT / PLAN: 84 year old pleasant white male seen today for follow-up mild small airways disease COPD secondary to secondhand smoke exposure.  Over the last several months patient has had adverse reactions and inadequate results from some of his inhalers and at this time the current inhaler that has helped the most his Breo 100  No signs of infection at this time No indication for steroids at this time, no need for antibiotics at this time Continue Breo 100 as prescribed Albuterol as needed Cough suppressant as needed   Flu shot is up-to-date  Follow-up in 6 months   Elven Laboy Patricia Pesa, M.D.  [facility]  Medical Director [place] Director [facility] Cardio-Pulmonary Department

## 2018-06-14 NOTE — Patient Instructions (Signed)
Continue BREO as prescribed  UP to date on LFU SHOT   Albuterol as needed

## 2018-08-15 DIAGNOSIS — B356 Tinea cruris: Secondary | ICD-10-CM | POA: Diagnosis not present

## 2018-08-21 ENCOUNTER — Ambulatory Visit (INDEPENDENT_AMBULATORY_CARE_PROVIDER_SITE_OTHER): Payer: Medicare Other | Admitting: Neurology

## 2018-08-21 ENCOUNTER — Encounter: Payer: Self-pay | Admitting: Neurology

## 2018-08-21 DIAGNOSIS — G20 Parkinson's disease: Secondary | ICD-10-CM | POA: Diagnosis not present

## 2018-08-21 MED ORDER — CARBIDOPA-LEVODOPA ER 48.75-195 MG PO CPCR: 195.0000 mg | ORAL_CAPSULE | Freq: Every day | ORAL | 3 refills | Status: DC

## 2018-08-21 NOTE — Patient Instructions (Addendum)
We can try to increase your Rytary to 5 times a day, about 5 hours apart. Try to take the medication away from your mealtimes for better absorption.   Please try to add some resistance training, like with resistance bands.

## 2018-08-21 NOTE — Progress Notes (Signed)
Subjective:    Patient ID: Adam Moss is a 85 y.o. male.  HPI     Interim history:   Mr. Vangilder is a very pleasant 85 year old left-handed gentleman with an underlying medical history of degenerative spine disease, allergies, BPH, hypertension, arthritis, reflux disease, and insomnia, who presents for follow-up consultation of his Parkinson's disease. The patient is unaccompanied today, and presents for his 6 monthly checkup. I last saw him on 02/16/2012, at which time he reported feeling stable. He had occasional issues with constipation and had some mild forgetfulness, otherwise was doing well. I suggested he continue with his Rytary.   Today, 08/21/2017 (all dictated new, as well as above notes, some dictation done in note pad or Word, outside of chart, may appear as copied):  He reports feeling stable, no recent falls. He takes the Rytary 4 times a day, about 6 hourly, typically with meals, so to remember. He is not exercising regularly. Occasional constipation. Drives, but limits himself. Tremor has become a little worse.   The patient's allergies, current medications, family history, past medical history, past social history, past surgical history and problem list were reviewed and updated as appropriate.    Previously (copied from previous notes for reference):  I saw him on 08/18/2017, at which time he felt stable. We continued with his Rytary, 195 mg qid.      I saw him on 04/18/17, at which time he reported worsening tremor, he was having some difficulty with fine motor control. He was taking Rytary 4 times a day and at bedtime. He was trying to keep set schedule for his bedtime routine and wake up routine and medication regimen. I suggested he change the timing of his medication to 1 pill at 6, 11, 4 PM and 10 PM. We talked about potentially increasing the medication to 1 pill 5 times a day.   I saw him on 10/13/2016, at which time he reported doing okay. He had occasional  muscle pain in different areas of his lower back and also thighs, sometimes around the ankles. He was trying to walk on a regular basis. Memory mood were stable. Sadly, his wife passed away in 2016-06-04. I suggested, we keep his meds the same.     We had to cancel an appointment for 06/08/2016 and he missed an appointment on 07/21/2016. I saw him on 02/04/2016, at which time he reported doing okay, tolerating Rytary 195 mg qid without significant side effects. He was not always exercising regularly. He does visit his wife in the afternoon every day. He was not always hydrating well enough. He had gained some weight. He had no recent falls, no complaints of hallucinations or memory issues, occasional depressed mood but no sustained symptoms of depression. I suggested we continue with his medication regimen. He was encouraged to hydrate better and exercise on a more regular basis.   I saw him on 09/24/2015, at which time he reported doing a little better, able to tolerate the Rytary qid. Thankfully, h had no recent falls. He was not sleeping very well at night. He would visit his wife in memory care every day. His 3 children with visit about once a month. Is trying to hydrate well. He had noticed some ankle swelling and residual knee pain bilaterally. He was able to pursue his hobby of building model ships. I suggested he continue with Rytary at the current dose. He was encouraged to try melatonin for sleep.    I saw him on  05/26/2015, at which time he reported doing somewhat better with the new medication, Rytary, with improvement noted in fine motor skills and tremors. He had recent blood work through his primary care physician which I reviewed at the time: Lipid profile was unremarkable with the exception of triglycerides borderline at 155, BMP was normal and CBC was normal. He reported that his leg swelling was a little better. He was avoiding nighttime driving. He was visiting his wife and memory care  regularly. He was able to tolerate the new medication which was really reassuring. She was taking Rytary 195 mg, one pill at 9 AM, 1 pill at 5 PM, 1 pill at bedtime which is usually around midnight. He had gained about 10 pounds and was trying to lose weight. He worried about his weight gain but did admit to having good food and lots of choices for dessert at his assisted living place. I suggested a gentle increase in his medication to 1 pill 4 times a day.   I first met him on 02/21/2015 at the request of his primary neurologist, at which time the patient reported a history of left-sided tremor, fine motor dyscontrol, and gait difficulties. Symptoms dated back to 2-3 years prior. He had multiple medication intolerances particularly issues with GI side effects. I suggested a trial of Rytary, 95 mg strength with titration to 2 pills 3 times a day. I also suggested we proceed with a DaT scan and I referred him to [facility] nuclear medicine. He had the study on 03/19/2015 which showed abnormal image grade 1: Asymmetric uptake with normal or almost normal putamen activity in one hemisphere and with a more marked reduction in the contralateral putamen. This pattern of activity can be seen with Parkinson's disease or related syndromes.    Of note, the radiotracer uptake was decreased on the right. We called the patient with the test results. The patient emailed me in August reporting a slight decrease in his tremor with a new medication and ability to tolerated thus far. He asked for change and pill strength of possible because the medication is very expensive. I changed him to 195 mg strength one pill 3 times a day.    02/21/2015: He has a history of left-sided tremor, fine motor dyscontrol, and gait difficulty. 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 Symptoms date back to 2014  or the year before, when he started having difficulty getting out of a chair, tremors on the left side, slowness, and he started seeing you at the neuroscience center in [facility]. He was diagnosed with Parkinson's disease and tried on different medications but had side effects, primarily GI related side effects. He has a history of cholecystectomy. Sinemet caused severe abdominal pain and nausea. He was tried on Neupro which also caused stomach pain, and on Azilect low-dose he did not think he had any response. He has been on amantadine. His previous records were reviewed: This includes neurologic office notes from 01/10/2015, 11/08/2014, 09/09/2014, 08/19/2014, 05/20/2014, 02/18/2014, 11/30/2013, 07/06/2013, 06/01/2013, and 02/23/2013. He had a brain MRI years ago but results are not available for my review. He is currently on amantadine 100 mg twice daily. In 2015 he was tried on pramipexole but had severe GI upset. In July 2015 he reported that he had improvement with Neupro but stomach pain. In January 2016 he tried Sinemet  but had to stop after a few days only. Earlier this year his Azilect was discontinued. He has no family history of Parkinson's disease. He reports mild memory loss and mild sleep issues. He currently resides in independent living at [facility]. This is in [place]. His wife is a Marine scientist care. He drives. He has no recent history of falls. He tries to stay active physically. He has right knee pain and wears a soft brace around his right knee. He has a remote history of injury to his right knee. He has been off of amantadine for the past 2 weeks or so. He had significant blurry vision while on it to the point where he had to see an ophthalmologist. His blurry vision has improved since he stopped the amantadine. He feels that his symptoms have been slowly progressive. He is primarily bothered by his fine motor dyscontrol and tremor. He drives without significant problems but  does not drive long distances.  His Past Medical History Is Significant For: Past Medical History:  Diagnosis Date  . Cancer ([place])    skin  . Hyperlipemia   . Hypertension   . Parkinson disease ([place])   . Parkinsonism ([place]) 02/21/2015  . Spinal stenosis   . Tremor     His Past Surgical History Is Significant For: Past Surgical History:  Procedure Laterality Date  . APPENDECTOMY    . CATARACT EXTRACTION    . CHOLECYSTECTOMY    . TONSILLECTOMY    . VASECTOMY      His Family History Is Significant For: Family History  Problem Relation Age of Onset  . Cancer Mother   . Heart disease Father     His Social History Is Significant For: Social History   Socioeconomic History  . Marital status: Married    Spouse name: Not on file  . Number of children: 3  . Years of education: PhD  . Highest education level: Not on file  Occupational History  . Occupation: Retired  Scientific laboratory technician  . Financial resource strain: Not on file  . Food insecurity:    Worry: Not on file    Inability: Not on file  . Transportation needs:    Medical: Not on file    Non-medical: Not on file  Tobacco Use  . Smoking status: Never Smoker  . Smokeless tobacco: Never Used  Substance and Sexual Activity  . Alcohol use: No    Alcohol/week: 0.0 standard drinks  . Drug use: No  . Sexual activity: Not on file  Lifestyle  . Physical activity:    Days per week: Not on file    Minutes per session: Not on file  . Stress: Not on file  Relationships  . Social connections:    Talks on phone: Not on file    Gets together: Not on file    Attends religious service: Not on file    Active member of club or organization: Not on file    Attends meetings of clubs or organizations: Not on file    Relationship status: Not on file  Other Topics Concern  . Not on file  Social History Narrative   Denies caffeine use     His Allergies Are:  Allergies  Allergen Reactions  . Carbidopa-Levodopa Other (See  Comments)    Severe stomach pains  . Iodinated Diagnostic Agents     Pt is unsure of why this allergy is listed.  . Oxycodone-Acetaminophen Other (See Comments)    "boils on  skin"  . Tylenol [Acetaminophen]   . Tyloxapol   . Pramipexole Nausea Only  :   His Current Medications Are:  Outpatient Encounter Medications as of 08/21/2018  Medication Sig  . aspirin 81 MG tablet Take 81 mg by mouth daily.  . calcium carbonate (TUMS - DOSED IN MG ELEMENTAL CALCIUM) 500 MG chewable tablet Chew 1 tablet by mouth daily.  . Calcium Citrate 250 MG TABS Take by mouth.  . Carbidopa-Levodopa ER (RYTARY) 48.75-195 MG CPCR Take 195 mg by mouth 4 (four) times daily.  . cetirizine (ZYRTEC) 10 MG tablet Take 10 mg by mouth daily.  . finasteride (PROSCAR) 5 MG tablet Take 5 mg by mouth daily.  . fluticasone (FLONASE) 50 MCG/ACT nasal spray Place into both nostrils daily.  . fluticasone furoate-vilanterol (BREO ELLIPTA) 100-25 MCG/INH AEPB Inhale 1 puff into the lungs daily.  Marland Kitchen ipratropium (ATROVENT) 0.03 % nasal spray Place 0.03 mLs into both nostrils 3 (three) times daily as needed.  . niacin 500 MG tablet Take 500 mg by mouth every morning.   . olmesartan (BENICAR) 20 MG tablet Take 20 mg by mouth daily.  Marland Kitchen omeprazole (PRILOSEC) 40 MG capsule Take 40 mg by mouth daily.  Marland Kitchen PROAIR HFA 108 (90 Base) MCG/ACT inhaler INHALE 2 PUFFS INTO THE LUNGS AS NEEDED.  . Simethicone (GAS RELIEF PO) Take by mouth.  . simvastatin (ZOCOR) 10 MG tablet Take 10 mg by mouth daily.  . tamsulosin (FLOMAX) 0.4 MG CAPS capsule Take 0.4 mg by mouth.  . vitamin C (ASCORBIC ACID) 500 MG tablet Take 500 mg by mouth daily.  . [DISCONTINUED] fluticasone furoate-vilanterol (BREO ELLIPTA) 100-25 MCG/INH AEPB Inhale 1 puff into the lungs daily.   No facility-administered encounter medications on file as of 08/21/2018.   :  Review of Systems:  Out of a complete 14 point review of systems, all are reviewed and negative with the exception  of these symptoms as listed below: Review of Systems  Neurological:       Pt presents today to discuss his PD. Pt denies any recent falls.    Objective:  Neurological Exam  Physical Exam Physical Examination:   Vitals:   08/21/18 1411  BP: 128/88  Pulse: 88    General Examination: The patient is a very pleasant 85 y.o. male in no acute distress. He appears well-developed and well-nourished and well groomed.   HEENT:Normocephalic, atraumatic, pupils are equal, round and reactive to light and accommodation. He has corrective eyeglasses. He has mild saccadic breakdown on smooth pursuit testing, no nystagmus is noted. He had cataract repair on the left, mild decrease in eye blink rate and mild facial masking, stable. He has no significant drooling, oropharynx examination reveals mild mouth dryness, tongue protrudes centrally and palate elevates symmetrically, mild hypophonia noted.  Chest:Clear to auscultation without wheezing, rhonchi or crackles noted.  Heart:S1+S2+0, regular and normal without murmurs, rubs or gallops noted.   Abdomen:Soft, non-tender and non-distended with normal bowel sounds appreciated on auscultation.  Extremities:There istraceedema in the distal lower extremities bilaterally,wearing compression stockings up to knees bilaterally.  Skin: Warm and dry without trophic changes noted.  Musculoskeletal: exam reveals no obvious joint deformities, tenderness or joint swelling or erythema ,but reports mild discomfort in the hips and knees.   Neurologically:  Mental status: The patient is awake, alert and oriented in all 4 spheres. Hisimmediate and remote memory, attention, language skills and fund of knowledge are appropriate. There is no evidence of aphasia, agnosia, apraxia or anomia.  Speech is clear with normal prosody and enunciation, mildhypophonia. Thought process is linear. Mood is normaland affect is normal.  Cranial nerves II - XII are as  described above under HEENT exam. In addition:left shoulder slightly higher than right, stable.   Motor exam: Normal bulk, and strength for age. He has increase in tone on the left side. He has a more moderate resting tremor in the left upper extremity, no other resting tremor is noted today, tremor is a little worse from last time. Romberg is not tested for safety reasons. Fine motor testing is moderately impaired on the left and mild to moderately impaired on the right, fairly stable.  Cerebellar testing: No dysmetria or intention tremor. There is no truncal or gait ataxia.  Sensory exam: intact to light touch in the upper and lower extremities.  Gait, station and balance: [place] with mild difficulty, he pushes himself up with his hands. He needs no assistance. Posture is mildly stooped, fairly stable. Balance is slightly impaired, he walks with decreased arm swing on the left, slightly decreased stride length and decreased pace is noted. He turns in 3 steps.   Assessment and Plan:  In summary, Jiovanny Burdell a very pleasant 85 year old malewith anunderlying medical history of degenerative spine disease, allergies, BPH, arthritis, reflux disease, and hypertension, who presents for follow-up consultation of his left-sided predominant Parkinson's disease, complicated by significant medication intolerancesin the past, mostly secondary to GI related symptoms including stomach pain, exacerbation of reflux and nausea and vomiting. He was unable to tolerate Mirapex and Sinemet in the past. Fortunately, he has been able to tolerate Rytarybrand-name, which was started at 95 mg strength, up to 2 pills 3 times a day and then we switched to 195 mg strength, 1 pill3 times a day, then 4 times a day, with improvement in symptoms including tremor and fine motor skills. I suggested that he try to increase his Rytary to 1 pill 5 times a day, about 5 hours apart, preferably away from his mealtimes to ensure  better absorption. If he wants to referred back to 4 times a day it is okay, I did ask him should he want to try the increased regimen that he tried consistently for about 2 weeks to be able to tell the difference. He is encouraged to continue to try to stay active mentally and physically and exercise in the form of walking and at some strength training with resistance bands for example. He is reminded to stay well-hydrated with water and be proactive about constipation issues. Of note, he had a DaT scan in August 2016 which was supportive of Parkinson's disease with decreased radiotracer uptake on the right.I suggested a six-month follow-up. His prescription was renewed and increased. I answered all his questions today and he was in agreement. I spent 25 minutes in total face-to-face time with the patient, more than 50% of which was spent in counseling and coordination of care, reviewing test results, reviewing medication and discussing or reviewing the diagnosis of PD, its prognosis and treatment options. Pertinent laboratory and imaging test results that were available during this visit with the patient were reviewed by me and considered in my medical decision making (see chart for details).

## 2018-09-08 ENCOUNTER — Telehealth: Payer: Self-pay | Admitting: Internal Medicine

## 2018-09-08 MED ORDER — FLUTICASONE FUROATE-VILANTEROL 100-25 MCG/INH IN AEPB: 1.0000 | INHALATION_SPRAY | Freq: Every day | RESPIRATORY_TRACT | 1 refills | Status: DC

## 2018-09-08 NOTE — Telephone Encounter (Signed)
rx submitted to [facility]. Nothing further needed

## 2018-11-28 ENCOUNTER — Ambulatory Visit: Payer: Federal, State, Local not specified - PPO | Admitting: Internal Medicine

## 2018-12-04 DIAGNOSIS — R739 Hyperglycemia, unspecified: Secondary | ICD-10-CM | POA: Diagnosis not present

## 2018-12-04 DIAGNOSIS — R5383 Other fatigue: Secondary | ICD-10-CM | POA: Diagnosis not present

## 2018-12-04 DIAGNOSIS — G479 Sleep disorder, unspecified: Secondary | ICD-10-CM | POA: Diagnosis not present

## 2018-12-04 DIAGNOSIS — M2550 Pain in unspecified joint: Secondary | ICD-10-CM | POA: Diagnosis not present

## 2018-12-04 DIAGNOSIS — N401 Enlarged prostate with lower urinary tract symptoms: Secondary | ICD-10-CM | POA: Diagnosis not present

## 2018-12-04 DIAGNOSIS — Z0000 Encounter for general adult medical examination without abnormal findings: Secondary | ICD-10-CM | POA: Diagnosis not present

## 2018-12-04 DIAGNOSIS — Z125 Encounter for screening for malignant neoplasm of prostate: Secondary | ICD-10-CM | POA: Diagnosis not present

## 2018-12-04 DIAGNOSIS — I10 Essential (primary) hypertension: Secondary | ICD-10-CM | POA: Diagnosis not present

## 2018-12-04 DIAGNOSIS — E782 Mixed hyperlipidemia: Secondary | ICD-10-CM | POA: Diagnosis not present

## 2018-12-13 DIAGNOSIS — E782 Mixed hyperlipidemia: Secondary | ICD-10-CM | POA: Diagnosis not present

## 2018-12-13 DIAGNOSIS — I10 Essential (primary) hypertension: Secondary | ICD-10-CM | POA: Diagnosis not present

## 2019-01-10 ENCOUNTER — Telehealth: Payer: Self-pay | Admitting: Internal Medicine

## 2019-01-10 DIAGNOSIS — M7061 Trochanteric bursitis, right hip: Secondary | ICD-10-CM | POA: Diagnosis not present

## 2019-01-10 NOTE — Telephone Encounter (Signed)
Called patient for COVID-19 pre-screening for in office visit. ° °Have you recently traveled any where out of the local area in the last 2 weeks? No °Have you been in close contact with a person diagnosed with COVID-19 within the last 2 weeks? No °Do you currently have any of the following symptoms? If so, when did they start? °Cough     Diarrhea  Joint Pain °Fever      Muscle Pain  Red eyes °Shortness of breath   Abdominal pain Vomiting °Loss of smell    Rash   Sore Throat °Headache    Weakness Bruising or bleeding ° ° °Okay to proceed with visit 01/11/2019 ° ° °

## 2019-01-11 ENCOUNTER — Ambulatory Visit (INDEPENDENT_AMBULATORY_CARE_PROVIDER_SITE_OTHER): Payer: Medicare Other | Admitting: Internal Medicine

## 2019-01-11 ENCOUNTER — Other Ambulatory Visit: Payer: Self-pay

## 2019-01-11 ENCOUNTER — Encounter: Payer: Self-pay | Admitting: Internal Medicine

## 2019-01-11 VITALS — BP 126/70 | HR 94 | Temp 97.3°F | Ht 70.0 in | Wt 200.0 lb

## 2019-01-11 DIAGNOSIS — J449 Chronic obstructive pulmonary disease, unspecified: Secondary | ICD-10-CM | POA: Diagnosis not present

## 2019-01-11 MED ORDER — BREO ELLIPTA 100-25 MCG/INH IN AEPB: 1.0000 | INHALATION_SPRAY | Freq: Every day | RESPIRATORY_TRACT | 0 refills | Status: AC

## 2019-01-11 NOTE — Patient Instructions (Signed)
Continue BREO as prescribed Albuterol as needed

## 2019-01-11 NOTE — Progress Notes (Signed)
   Name: Adam Moss MRN: [id number] DOB: [birth year]     CONSULTATION DATE: 01/11/2019   REFERRING MD : Pryor Ochoa  CHIEF COMPLAINT:  Cough  STUDIES:  CXR 05/2017 I have Independently reviewed images of  CXR   Interpretation: no acute findings No opacities  PFT1.22.19 NL ratio Fev1 2.4L 80%predicted Fef25/75 1.7L 58% predicted Flow volume loops suggest obstructive pattern  PFT's reviewed with patient  HISTORY OF PRESENT ILLNESS:  Follow-up COPD Follow-up shortness of breath and dyspnea on exertion On Breo which has helped significantly He uses albuterol very infrequently  No signs of infection at this time No signs of COPD exacerbation  Good appetite Has problems walking due to bursitis   No chest pain No palpitations    Review of Systems:  Gen:  Denies  fever, sweats, chills weigh loss  HEENT: Denies blurred vision, double vision, ear pain, eye pain, hearing loss, nose bleeds, sore throat Cardiac:  No dizziness, chest pain or heaviness, chest tightness,edema, No JVD Resp:   No cough, -sputum production, -shortness of breath,-wheezing, -hemoptysis,  Gi: Denies swallowing difficulty, stomach pain, nausea or vomiting, diarrhea, constipation, bowel incontinence Gu:  Denies bladder incontinence, burning urine Ext:   Denies Joint pain, stiffness or swelling Skin: Denies  skin rash, easy bruising or bleeding or hives Endoc:  Denies polyuria, polydipsia , polyphagia or weight change Psych:   Denies depression, insomnia or hallucinations  Other:  All other systems negative    BP 126/70 (BP Location: Left Arm, Cuff Size: Normal)   Pulse 94   Temp (!) 97.3 F (36.3 C) (Temporal)   Ht 5\' 10"  (1.778 m)   Wt 200 lb (90.7 kg)   SpO2 96%   BMI 28.70 kg/m     Physical Examination:   GENERAL:NAD, no fevers, chills, no weakness no fatigue HEAD: Normocephalic, atraumatic.  EYES: PERLA, EOMI No scleral icterus.  NECK: Supple. No thyromegaly.  No JVD.   PULMONARY: CTA B/L no wheezing, rhonchi, crackles CARDIOVASCULAR: S1 and S2. Regular rate and rhythm. No murmurs GASTROINTESTINAL: Soft, nontender, nondistended. Positive bowel sounds.  MUSCULOSKELETAL: No swelling, clubbing, or edema.  NEUROLOGIC: No gross focal neurological deficits. 5/5 strength all extremities SKIN: No ulceration, lesions, rashes, or cyanosis.  PSYCHIATRIC: Insight, judgment intact. 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  The importance of social distancing was discussed today. Hand Washing Techniques and avoid touching face was advised.  MEDICATION ADJUSTMENTS/LABS AND TESTS ORDERED: Continue BREO as prescribed CURRENT MEDICATIONS REVIEWED AT LENGTH WITH PATIENT TODAY   Patient/Family are satisfied with Plan of action and management. All questions answered Follow up in  6 months   [place]ola Key Patricia Pesa, M.D.  [facility]  Medical Director [place] Director [facility] Cardio-Pulmonary Department

## 2019-02-07 DIAGNOSIS — D2262 Melanocytic nevi of left upper limb, including shoulder: Secondary | ICD-10-CM | POA: Diagnosis not present

## 2019-02-07 DIAGNOSIS — D2261 Melanocytic nevi of right upper limb, including shoulder: Secondary | ICD-10-CM | POA: Diagnosis not present

## 2019-02-07 DIAGNOSIS — L821 Other seborrheic keratosis: Secondary | ICD-10-CM | POA: Diagnosis not present

## 2019-02-07 DIAGNOSIS — D2271 Melanocytic nevi of right lower limb, including hip: Secondary | ICD-10-CM | POA: Diagnosis not present

## 2019-02-07 DIAGNOSIS — D225 Melanocytic nevi of trunk: Secondary | ICD-10-CM | POA: Diagnosis not present

## 2019-02-07 DIAGNOSIS — Z08 Encounter for follow-up examination after completed treatment for malignant neoplasm: Secondary | ICD-10-CM | POA: Diagnosis not present

## 2019-02-07 DIAGNOSIS — Z85828 Personal history of other malignant neoplasm of skin: Secondary | ICD-10-CM | POA: Diagnosis not present

## 2019-02-07 DIAGNOSIS — L400 Psoriasis vulgaris: Secondary | ICD-10-CM | POA: Diagnosis not present

## 2019-02-19 ENCOUNTER — Ambulatory Visit: Payer: Medicare Other | Admitting: Neurology

## 2019-02-19 ENCOUNTER — Telehealth: Payer: Self-pay

## 2019-02-19 NOTE — Telephone Encounter (Signed)
Pt did not show for their appt with Dr. Athar today.  

## 2019-02-20 ENCOUNTER — Encounter: Payer: Self-pay | Admitting: Neurology

## 2019-02-27 ENCOUNTER — Ambulatory Visit: Payer: Medicare Other | Admitting: Neurology

## 2019-03-04 ENCOUNTER — Other Ambulatory Visit: Payer: Self-pay | Admitting: Internal Medicine

## 2019-03-20 DIAGNOSIS — R937 Abnormal findings on diagnostic imaging of other parts of musculoskeletal system: Secondary | ICD-10-CM | POA: Diagnosis not present

## 2019-03-20 DIAGNOSIS — S42024A Nondisplaced fracture of shaft of right clavicle, initial encounter for closed fracture: Secondary | ICD-10-CM | POA: Diagnosis not present

## 2019-03-27 ENCOUNTER — Ambulatory Visit: Payer: Medicare Other | Admitting: Neurology

## 2019-03-27 DIAGNOSIS — M6281 Muscle weakness (generalized): Secondary | ICD-10-CM | POA: Diagnosis not present

## 2019-03-27 DIAGNOSIS — S42024D Nondisplaced fracture of shaft of right clavicle, subsequent encounter for fracture with routine healing: Secondary | ICD-10-CM | POA: Diagnosis not present

## 2019-03-27 DIAGNOSIS — S42001S Fracture of unspecified part of right clavicle, sequela: Secondary | ICD-10-CM | POA: Diagnosis not present

## 2019-03-27 DIAGNOSIS — G20 Parkinson's disease: Secondary | ICD-10-CM | POA: Diagnosis not present

## 2019-03-27 DIAGNOSIS — R279 Unspecified lack of coordination: Secondary | ICD-10-CM | POA: Diagnosis not present

## 2019-03-27 DIAGNOSIS — M25511 Pain in right shoulder: Secondary | ICD-10-CM | POA: Diagnosis not present

## 2019-03-27 DIAGNOSIS — R2689 Other abnormalities of gait and mobility: Secondary | ICD-10-CM | POA: Diagnosis not present

## 2019-03-28 DIAGNOSIS — R600 Localized edema: Secondary | ICD-10-CM | POA: Diagnosis not present

## 2019-03-28 DIAGNOSIS — E782 Mixed hyperlipidemia: Secondary | ICD-10-CM | POA: Diagnosis not present

## 2019-03-28 DIAGNOSIS — Z20828 Contact with and (suspected) exposure to other viral communicable diseases: Secondary | ICD-10-CM | POA: Diagnosis not present

## 2019-03-28 DIAGNOSIS — I10 Essential (primary) hypertension: Secondary | ICD-10-CM | POA: Diagnosis not present

## 2019-03-28 DIAGNOSIS — G20 Parkinson's disease: Secondary | ICD-10-CM | POA: Diagnosis not present

## 2019-03-30 DIAGNOSIS — M8440XA Pathological fracture, unspecified site, initial encounter for fracture: Secondary | ICD-10-CM | POA: Insufficient documentation

## 2019-03-30 NOTE — Progress Notes (Signed)
[name]  [phone] [phone]  ID: Adam Moss OB: 15-Nov-1933  MR#: [id number]  [id number]  Patient Care Team: Finis Bud, MD as PCP - General (Family Medicine)  CHIEF COMPLAINT: Pathologic fracture  INTERVAL HISTORY: Patient is an 85 year old male who had a fractured right clavicle after pushing from a seated to standing position.  X-rays revealed suspicion of pathologic fracture and patient has been referred for further evaluation and to assess for primary.  He has a resting tremor secondary to his Parkinson's, but no other neurologic complaints.  He continues to have pain in his right collarbone.  He denies any recent fevers or illnesses.  He has good appetite and denies weight loss.  He has no chest pain, shortness of breath, cough, or hemoptysis.  He denies any nausea, vomiting, constipation, or diarrhea.  He has no urinary complaints.  Patient otherwise feels well and offers no further specific complaints today.  REVIEW OF SYSTEMS:   Review of Systems  Constitutional: Negative.  Negative for fever, malaise/fatigue and weight loss.  Respiratory: Negative.  Negative for cough, hemoptysis and shortness of breath.   Cardiovascular: Negative.  Negative for chest pain and leg swelling.  Gastrointestinal: Negative.  Negative for abdominal pain.  Genitourinary: Negative.  Negative for dysuria.  Musculoskeletal: Negative.  Negative for back pain.  Skin: Negative.  Negative for rash.  Neurological: Positive for tremors. Negative for dizziness, focal weakness, weakness and headaches.  Psychiatric/Behavioral: Negative.  The patient is not nervous/anxious.     As per HPI. Otherwise, a complete review of systems is negative.  PAST MEDICAL HISTORY: Past Medical History:  Diagnosis Date  . Cancer ([place])    skin  . Hyperlipemia   . Hypertension   . Parkinson disease ([place])   . Parkinsonism ([place]) 02/21/2015  . Spinal stenosis   .  Tremor     PAST SURGICAL HISTORY: Past Surgical History:  Procedure Laterality Date  . APPENDECTOMY    . CATARACT EXTRACTION    . CHOLECYSTECTOMY    . TONSILLECTOMY    . VASECTOMY      FAMILY HISTORY: Family History  Problem Relation Age of Onset  . Cancer Mother   . Heart disease Father     ADVANCED DIRECTIVES (Y/N):  N  HEALTH MAINTENANCE: Social History   Tobacco Use  . Smoking status: Never Smoker  . Smokeless tobacco: Never Used  Substance Use Topics  . Alcohol use: No    Alcohol/week: 0.0 standard drinks  . Drug use: No     Colonoscopy:  PAP:  Bone density:  Lipid panel:  Allergies  Allergen Reactions  . Carbidopa-Levodopa Other (See Comments)    Severe stomach pains  . Iodinated Diagnostic Agents     Pt is unsure of why this allergy is listed.  . Oxycodone-Acetaminophen Other (See Comments)    "boils on skin"  . Tylenol [Acetaminophen]   . Tyloxapol   . Pramipexole Nausea Only    Current Outpatient Medications  Medication Sig Dispense Refill  . aspirin 81 MG tablet Take 81 mg by mouth daily.    Marland Kitchen BREO ELLIPTA 100-25 MCG/INH AEPB USE 1 INHALATION ORALLY    DAILY 60 each 1  . calcium carbonate (TUMS - DOSED IN MG ELEMENTAL CALCIUM) 500 MG chewable tablet Chew 1 tablet by mouth daily.    . Calcium Citrate 250 MG TABS Take by mouth.    . Carbidopa-Levodopa ER (RYTARY) 48.75-195 MG CPCR Take 195 mg by mouth 5 (five)  times daily. 450 capsule 3  . cetirizine (ZYRTEC) 10 MG tablet Take 10 mg by mouth daily.    . finasteride (PROSCAR) 5 MG tablet Take 5 mg by mouth daily.    . fluticasone (FLONASE) 50 MCG/ACT nasal spray Place into both nostrils daily.    Marland Kitchen ipratropium (ATROVENT) 0.03 % nasal spray Place 0.03 mLs into both nostrils 3 (three) times daily as needed.  11  . niacin 500 MG tablet Take 500 mg by mouth every morning.     . olmesartan (BENICAR) 20 MG tablet Take 20 mg by mouth daily.    Marland Kitchen omeprazole (PRILOSEC) 40 MG capsule Take 40 mg by mouth  daily.    Marland Kitchen PROAIR HFA 108 (90 Base) MCG/ACT inhaler INHALE 2 PUFFS INTO THE LUNGS AS NEEDED. 17 Inhaler 3  . Simethicone (GAS RELIEF PO) Take by mouth.    . simvastatin (ZOCOR) 10 MG tablet Take 10 mg by mouth daily.    . tamsulosin (FLOMAX) 0.4 MG CAPS capsule Take 0.4 mg by mouth.    . vitamin C (ASCORBIC ACID) 500 MG tablet Take 500 mg by mouth daily.     No current facility-administered medications for this visit.     OBJECTIVE: There were no vitals filed for this visit.   There is no height or weight on file to calculate BMI.    ECOG FS:1 - Symptomatic but completely ambulatory  General: Well-developed, well-nourished, no acute distress.  Sitting in a wheelchair. Eyes: Pink conjunctiva, anicteric sclera. HEENT: Normocephalic, moist mucous membranes, clear oropharnyx. Lungs: Clear to auscultation bilaterally. Heart: Regular rate and rhythm. No rubs, murmurs, or gallops. Abdomen: Soft, nontender, nondistended. No organomegaly noted, normoactive bowel sounds. Musculoskeletal: No edema, cyanosis, or clubbing.  Right arm in sling. Neuro: Alert, answering all questions appropriately. Cranial nerves grossly intact. Skin: No rashes or petechiae noted. Psych: Normal affect. Lymphatics: No cervical, calvicular, axillary or inguinal LAD.   LAB RESULTS:  Lab Results  Component Value Date   NA 139 10/27/2014   K 3.5 10/27/2014   CL 103 10/27/2014   CO2 28 10/27/2014   GLUCOSE 111 (H) 10/27/2014   BUN 13 10/27/2014   CREATININE 0.96 10/27/2014   CALCIUM 9.2 10/27/2014   GFRNONAA >60 10/27/2014   GFRAA >60 10/27/2014    Lab Results  Component Value Date   WBC 7.3 10/27/2014   HGB 15.8 10/27/2014   HCT 46.4 10/27/2014   MCV 90 10/27/2014   PLT 179 10/27/2014     STUDIES: No results found.  ASSESSMENT: Pathologic fracture  PLAN:    1. Pathologic fracture: X-ray of right clavicle reviewed independently highly suspicious for pathologic fracture.  Have ordered a nuclear  med bone scan as well as CT of the abdomen and pelvis to assess for primary.  CT scan of the chest on April 01, 2019 reviewed independently without evidence of malignancy.  PSA and SPEP are pending at time of dictation.  Will have a video assisted telemedicine visit in 2 to 3 weeks to discuss his imaging and laboratory results and any other diagnostic testing necessary.  I spent a total of 60 minutes face-to-face with the patient of which greater than 50% of the visit was spent in counseling and coordination of care as detailed above.   Patient expressed understanding and was in agreement with this plan. He also understands that He can call clinic at any time with any questions, concerns, or complaints.   Cancer Staging No matching staging information was found  for the patient.  Lloyd Huger, MD   03/30/2019 4:16 PM

## 2019-04-01 ENCOUNTER — Encounter: Payer: Self-pay | Admitting: Emergency Medicine

## 2019-04-01 ENCOUNTER — Emergency Department: Payer: Medicare Other

## 2019-04-01 ENCOUNTER — Inpatient Hospital Stay
Admission: EM | Admit: 2019-04-01 | Discharge: 2019-04-03 | DRG: 603 | Disposition: A | Discharge status: Home Health | Payer: Medicare Other | Attending: Internal Medicine | Admitting: Internal Medicine

## 2019-04-01 ENCOUNTER — Ambulatory Visit (INDEPENDENT_AMBULATORY_CARE_PROVIDER_SITE_OTHER)
Admission: EM | Admit: 2019-04-01 | Discharge: 2019-04-01 | Disposition: A | Discharge status: Home / Self Care | Payer: Medicare Other | Source: Home / Self Care

## 2019-04-01 ENCOUNTER — Other Ambulatory Visit: Payer: Self-pay

## 2019-04-01 DIAGNOSIS — M7989 Other specified soft tissue disorders: Secondary | ICD-10-CM | POA: Diagnosis not present

## 2019-04-01 DIAGNOSIS — Z20828 Contact with and (suspected) exposure to other viral communicable diseases: Secondary | ICD-10-CM | POA: Diagnosis not present

## 2019-04-01 DIAGNOSIS — S42301D Unspecified fracture of shaft of humerus, right arm, subsequent encounter for fracture with routine healing: Secondary | ICD-10-CM

## 2019-04-01 DIAGNOSIS — I952 Hypotension due to drugs: Secondary | ICD-10-CM | POA: Diagnosis present

## 2019-04-01 DIAGNOSIS — E785 Hyperlipidemia, unspecified: Secondary | ICD-10-CM | POA: Diagnosis present

## 2019-04-01 DIAGNOSIS — R0789 Other chest pain: Secondary | ICD-10-CM | POA: Diagnosis not present

## 2019-04-01 DIAGNOSIS — Z8249 Family history of ischemic heart disease and other diseases of the circulatory system: Secondary | ICD-10-CM

## 2019-04-01 DIAGNOSIS — Y92230 Patient room in hospital as the place of occurrence of the external cause: Secondary | ICD-10-CM | POA: Diagnosis present

## 2019-04-01 DIAGNOSIS — L03116 Cellulitis of left lower limb: Secondary | ICD-10-CM | POA: Diagnosis present

## 2019-04-01 DIAGNOSIS — Z9852 Vasectomy status: Secondary | ICD-10-CM

## 2019-04-01 DIAGNOSIS — E876 Hypokalemia: Secondary | ICD-10-CM | POA: Diagnosis not present

## 2019-04-01 DIAGNOSIS — Z885 Allergy status to narcotic agent status: Secondary | ICD-10-CM

## 2019-04-01 DIAGNOSIS — Z79899 Other long term (current) drug therapy: Secondary | ICD-10-CM

## 2019-04-01 DIAGNOSIS — L03115 Cellulitis of right lower limb: Principal | ICD-10-CM | POA: Diagnosis present

## 2019-04-01 DIAGNOSIS — T501X5A Adverse effect of loop [high-ceiling] diuretics, initial encounter: Secondary | ICD-10-CM | POA: Diagnosis present

## 2019-04-01 DIAGNOSIS — I890 Lymphedema, not elsewhere classified: Secondary | ICD-10-CM | POA: Diagnosis not present

## 2019-04-01 DIAGNOSIS — L03119 Cellulitis of unspecified part of limb: Secondary | ICD-10-CM | POA: Insufficient documentation

## 2019-04-01 DIAGNOSIS — X500XXD Overexertion from strenuous movement or load, subsequent encounter: Secondary | ICD-10-CM

## 2019-04-01 DIAGNOSIS — I878 Other specified disorders of veins: Secondary | ICD-10-CM | POA: Diagnosis not present

## 2019-04-01 DIAGNOSIS — Z7951 Long term (current) use of inhaled steroids: Secondary | ICD-10-CM

## 2019-04-01 DIAGNOSIS — M899 Disorder of bone, unspecified: Secondary | ICD-10-CM

## 2019-04-01 DIAGNOSIS — Z03818 Encounter for observation for suspected exposure to other biological agents ruled out: Secondary | ICD-10-CM | POA: Diagnosis not present

## 2019-04-01 DIAGNOSIS — G20 Parkinson's disease: Secondary | ICD-10-CM | POA: Diagnosis present

## 2019-04-01 DIAGNOSIS — M4800 Spinal stenosis, site unspecified: Secondary | ICD-10-CM | POA: Diagnosis present

## 2019-04-01 DIAGNOSIS — Z7982 Long term (current) use of aspirin: Secondary | ICD-10-CM

## 2019-04-01 DIAGNOSIS — R600 Localized edema: Secondary | ICD-10-CM

## 2019-04-01 DIAGNOSIS — Z85828 Personal history of other malignant neoplasm of skin: Secondary | ICD-10-CM

## 2019-04-01 DIAGNOSIS — Z9049 Acquired absence of other specified parts of digestive tract: Secondary | ICD-10-CM

## 2019-04-01 DIAGNOSIS — R609 Edema, unspecified: Secondary | ICD-10-CM

## 2019-04-01 DIAGNOSIS — Q2546 Tortuous aortic arch: Secondary | ICD-10-CM | POA: Diagnosis not present

## 2019-04-01 DIAGNOSIS — Z91041 Radiographic dye allergy status: Secondary | ICD-10-CM

## 2019-04-01 DIAGNOSIS — Z888 Allergy status to other drugs, medicaments and biological substances status: Secondary | ICD-10-CM

## 2019-04-01 DIAGNOSIS — Z9849 Cataract extraction status, unspecified eye: Secondary | ICD-10-CM

## 2019-04-01 DIAGNOSIS — N400 Enlarged prostate without lower urinary tract symptoms: Secondary | ICD-10-CM | POA: Diagnosis present

## 2019-04-01 DIAGNOSIS — I10 Essential (primary) hypertension: Secondary | ICD-10-CM | POA: Diagnosis present

## 2019-04-01 LAB — COMPREHENSIVE METABOLIC PANEL
ALT: 7 U/L (ref 0–44)
AST: 19 U/L (ref 15–41)
Albumin: 3.7 g/dL (ref 3.5–5.0)
Alkaline Phosphatase: 65 U/L (ref 38–126)
Anion gap: 9 (ref 5–15)
BUN: 28 mg/dL — ABNORMAL HIGH (ref 8–23)
CO2: 25 mmol/L (ref 22–32)
Calcium: 8.9 mg/dL (ref 8.9–10.3)
Chloride: 100 mmol/L (ref 98–111)
Creatinine, Ser: 0.79 mg/dL (ref 0.61–1.24)
GFR calc Af Amer: >60 mL/min (ref >60)
GFR calc non Af Amer: >60 mL/min (ref >60)
Glucose, Bld: 105 mg/dL — ABNORMAL HIGH (ref 70–99)
Potassium: 4 mmol/L (ref 3.5–5.1)
Sodium: 134 mmol/L — ABNORMAL LOW (ref 135–145)
Total Bilirubin: 1 mg/dL (ref 0.3–1.2)
Total Protein: 6.5 g/dL (ref 6.5–8.1)

## 2019-04-01 LAB — SARS CORONAVIRUS 2 BY RT PCR (HOSPITAL ORDER, PERFORMED IN ~~LOC~~ HOSPITAL LAB): SARS Coronavirus 2: NEGATIVE

## 2019-04-01 LAB — CBC
HCT: 36.2 % — ABNORMAL LOW (ref 39.0–52.0)
Hemoglobin: 12.5 g/dL — ABNORMAL LOW (ref 13.0–17.0)
MCH: 30 pg (ref 26.0–34.0)
MCHC: 34.5 g/dL (ref 30.0–36.0)
MCV: 87 fL (ref 80.0–100.0)
Platelets: 162 10*3/uL (ref 150–400)
RBC: 4.16 MIL/uL — ABNORMAL LOW (ref 4.22–5.81)
RDW: 15.9 % — ABNORMAL HIGH (ref 11.5–15.5)
WBC: 5.6 10*3/uL (ref 4.0–10.5)
nRBC: 0 % (ref 0.0–0.2)

## 2019-04-01 LAB — BRAIN NATRIURETIC PEPTIDE: B Natriuretic Peptide: 29 pg/mL (ref 0.0–100.0)

## 2019-04-01 LAB — FIBRIN DERIVATIVES D-DIMER (ARMC ONLY): Fibrin derivatives D-dimer (ARMC): 3310.81 ng/mL (FEU) — ABNORMAL HIGH (ref 0.00–499.00)

## 2019-04-01 MED ORDER — DIPHENHYDRAMINE HCL 25 MG PO CAPS
50.0000 mg | ORAL_CAPSULE | Freq: Once | ORAL | Status: AC
  Administered 2019-04-01: 50 mg via ORAL
  Filled 2019-04-01: qty 2

## 2019-04-01 MED ORDER — CARBIDOPA-LEVODOPA ER 48.75-195 MG PO CPCR: 195.0000 mg | ORAL_CAPSULE | Freq: Every day | ORAL | Status: DC

## 2019-04-01 MED ORDER — CEPHALEXIN 500 MG PO CAPS: 500.0000 mg | ORAL_CAPSULE | Freq: Three times a day (TID) | ORAL | 0 refills | Status: DC

## 2019-04-01 MED ORDER — CARBIDOPA-LEVODOPA ER 50-200 MG PO TBCR
1.0000 | EXTENDED_RELEASE_TABLET | Freq: Two times a day (BID) | ORAL | Status: DC
  Administered 2019-04-01: 1 via ORAL
  Filled 2019-04-01 (×3): qty 1

## 2019-04-01 MED ORDER — DIPHENHYDRAMINE HCL 50 MG/ML IJ SOLN: 50.0000 mg | Freq: Once | INTRAMUSCULAR | Status: AC

## 2019-04-01 MED ORDER — HYDROCORTISONE NA SUCCINATE PF 250 MG IJ SOLR
200.0000 mg | Freq: Once | INTRAMUSCULAR | Status: AC
  Administered 2019-04-01: 200 mg via INTRAVENOUS
  Filled 2019-04-01: qty 200

## 2019-04-01 MED ORDER — IOHEXOL 350 MG/ML SOLN
75.0000 mL | Freq: Once | INTRAVENOUS | Status: AC | PRN
  Administered 2019-04-01: 75 mL via INTRAVENOUS

## 2019-04-01 NOTE — Discharge Instructions (Addendum)
-  given your symptoms with swelling and the elevated d-dimer lab, there is strong suspicion that you have blood clots causing your swelling.  This needs evaluated in the ER with likely initiation of treatment at that point as well.

## 2019-04-01 NOTE — ED Notes (Signed)
Spoke to Franks Field, daughter, and informed her of plan of care.

## 2019-04-01 NOTE — ED Notes (Signed)
Xray at bedside. Blanket provided to pt.

## 2019-04-01 NOTE — ED Notes (Signed)
US at bedside

## 2019-04-01 NOTE — ED Triage Notes (Signed)
PT sent over from [facility] for bilateral leg swelling, redness and elevated D-dimer.

## 2019-04-01 NOTE — ED Triage Notes (Addendum)
Pt here for edema bilateral legs for 1 week, has on compression socks but unable to get them on by his self due to his right clavicle being broken and parkinson disease on the left side of his body. Was given lasix from his pcp recently and states the swelling didn't get better but he was urinating a lot with the fluid pill. Swelling got worse yesterday, was up to 4 pills a day. Was instructed to go to the ER but decided to come here due to having to quaratine at his assistant living if he goes to the ER and his family couldn't come help him with his socks and ADL. Does have a order for a CMP. Does have some drainage from his right leg which is coming through his compression stockings and said he is having some drainage from the bottom of both feet.

## 2019-04-01 NOTE — ED Notes (Signed)
[name]- daughter (wants updates/can pick pt up if [facility])  Cell: [phone] Home: [phone]

## 2019-04-01 NOTE — ED Notes (Signed)
Patient taken to CT scan.

## 2019-04-01 NOTE — ED Notes (Addendum)
Pt states his legs started swelling 4 days ago. Pt went urgent care and had an elevated D-dimer. Pt lower legs are swollen with erythema in both. Pt states he has some SOB but that is his baseline. Pt denies any needs at this time. Will continue to monitor.

## 2019-04-01 NOTE — ED Provider Notes (Signed)
[facility] Emergency Department Provider Note    First MD Initiated Contact with Patient 04/01/19 1609     (approximate)  I have reviewed the triage vital signs and the nursing notes.   HISTORY  Chief Complaint Leg Swelling    HPI Oval Littrel is a 85 y.o. male presents the ER for 1 week of worsening bilateral leg swelling with some redness and discomfort.  Patient seen at urgent care was sent to the ER due to concern for DVT.  Denies any shortness of breath or chest pain.  No history of bleeding disorders or blood clots.  Is not on any blood thinners but does take a baby aspirin.   States he was tested for coronavirus his living facility with negative result yesterday.   Past Medical History:  Diagnosis Date  . Cancer ([place])    skin  . Hyperlipemia   . Hypertension   . Parkinson disease ([place])   . Parkinsonism ([place]) 02/21/2015  . Spinal stenosis   . Tremor    Family History  Problem Relation Age of Onset  . Cancer Mother   . Heart disease Father    Past Surgical History:  Procedure Laterality Date  . APPENDECTOMY    . CATARACT EXTRACTION    . CHOLECYSTECTOMY    . TONSILLECTOMY    . VASECTOMY     Patient Active Problem List   Diagnosis Date Noted  . Pathologic fracture 03/30/2019  . Cough 12/04/2015  . Non-seasonal allergic rhinitis 12/04/2015  . Parkinsonism ([place]) 02/21/2015  . Spinal stenosis in cervical region 11/30/2013  . Tremor 11/30/2013  . Patient risk and functional assessment 04/14/2012  . History of actinic keratoses 10/02/2010  . History of nonmelanoma skin cancer 10/02/2010  . Chest pain, unspecified 03/28/2007      Prior to Admission medications   Medication Sig Start Date End Date Taking? Authorizing Provider  aspirin 81 MG tablet Take 81 mg by mouth daily.   Yes [provider]  BREO ELLIPTA 100-25 MCG/INH AEPB USE 1 INHALATION ORALLY    DAILY 03/05/19  Yes Kasa, Maretta Bees, MD  calcium carbonate (TUMS -  DOSED IN MG ELEMENTAL CALCIUM) 500 MG chewable tablet Chew 1 tablet by mouth daily.   Yes [provider]  Calcium Citrate 250 MG TABS Take by mouth.   Yes [provider]  Carbidopa-Levodopa ER (RYTARY) 48.75-195 MG CPCR Take 195 mg by mouth 5 (five) times daily. 08/21/18  Yes Star Age, MD  cetirizine (ZYRTEC) 10 MG tablet Take 10 mg by mouth daily.   Yes [provider]  finasteride (PROSCAR) 5 MG tablet Take 5 mg by mouth daily.   Yes [provider]  fluticasone (FLONASE) 50 MCG/ACT nasal spray Place into both nostrils daily.   Yes [provider]  furosemide (LASIX) 20 MG tablet Take 20 mg by mouth daily.  03/28/19  Yes [provider]  ipratropium (ATROVENT) 0.03 % nasal spray Place 0.03 mLs into both nostrils 3 (three) times daily as needed. 05/25/17  Yes [provider]  niacin 500 MG tablet Take 500 mg by mouth every morning.    Yes [provider]  olmesartan (BENICAR) 20 MG tablet Take 20 mg by mouth daily.   Yes [provider]  omeprazole (PRILOSEC) 40 MG capsule Take 40 mg by mouth daily.   Yes [provider]  PROAIR HFA 108 (90 Base) MCG/ACT inhaler INHALE 2 PUFFS INTO THE LUNGS AS NEEDED. 03/30/18  Yes Kasa,  Maretta Bees, MD  Simethicone (GAS RELIEF PO) Take by mouth.   Yes [provider]  simvastatin (ZOCOR) 10 MG tablet Take 10 mg by mouth daily.   Yes [provider]  tamsulosin (FLOMAX) 0.4 MG CAPS capsule Take 0.4 mg by mouth daily.    Yes [provider]  vitamin C (ASCORBIC ACID) 500 MG tablet Take 500 mg by mouth daily.   Yes [provider]  cephALEXin (KEFLEX) 500 MG capsule Take 1 capsule (500 mg total) by mouth 3 (three) times daily for 7 days. 04/01/19 04/08/19  Merlyn Lot, MD    Allergies Carbidopa-levodopa, Iodinated diagnostic agents, Oxycodone-acetaminophen, Tylenol [acetaminophen], Tyloxapol, and Pramipexole    Social History Social  History   Tobacco Use  . Smoking status: Never Smoker  . Smokeless tobacco: Never Used  Substance Use Topics  . Alcohol use: No    Alcohol/week: 0.0 standard drinks  . Drug use: No    Review of Systems Patient denies headaches, rhinorrhea, blurry vision, numbness, shortness of breath, chest pain, edema, cough, abdominal pain, nausea, vomiting, diarrhea, dysuria, fevers, rashes or hallucinations unless otherwise stated above in HPI. ____________________________________________   PHYSICAL EXAM:  VITAL SIGNS: Vitals:   04/01/19 2200 04/01/19 2230  BP: 138/82 (!) 128/103  Pulse: 68 73  Resp: 19 (!) 26  Temp:    SpO2: 96% 97%    Constitutional: Alert and oriented.  Eyes: Conjunctivae are normal.  Head: Atraumatic. Nose: No congestion/rhinnorhea. Mouth/Throat: Mucous membranes are moist.   Neck: No stridor. Painless ROM.  Cardiovascular: Normal rate, regular rhythm. Grossly normal heart sounds.  Good peripheral circulation. Respiratory: Normal respiratory effort.  No retractions. Lungs CTAB. Gastrointestinal: Soft and nontender. No distention. No abdominal bruits. No CVA tenderness. Genitourinary:  Musculoskeletal: ble pitting edema with venous stasis changes, lle warmer to touch than the right.  No joint effusions. Neurologic:  Normal speech and language. No gross focal neurologic deficits are appreciated. No facial droop Skin:  Skin is warm, dry and intact. No rash noted. Psychiatric: Mood and affect are normal. Speech and behavior are normal.  ____________________________________________   LABS (all labs ordered are listed, but only abnormal results are displayed)  Results for orders placed or performed during the hospital encounter of 04/01/19 (from the past 24 hour(s))  Brain natriuretic peptide     Status: None   Collection Time: 04/01/19  4:25 PM  Result Value Ref Range   B Natriuretic Peptide 29.0 0.0 - 100.0 pg/mL  SARS Coronavirus 2 [facility] order, Performed  in [facility] hospital lab) Nasopharyngeal Nasopharyngeal Swab     Status: None   Collection Time: 04/01/19  6:58 PM   Specimen: Nasopharyngeal Swab  Result Value Ref Range   SARS Coronavirus 2 NEGATIVE NEGATIVE   ____________________________________________  EKG My review and personal interpretation at Time: 16:22   Indication: leg swelling  Rate: 80  Rhythm: sinus Axis: normal Other: limited due to parkinson tremor causing baseline artifact,   ____________________________________________  RADIOLOGY  I personally reviewed all radiographic images ordered to evaluate for the above acute complaints and reviewed radiology reports and findings.  These findings were personally discussed with the patient.  Please see medical record for radiology report.  ____________________________________________   PROCEDURES  Procedure(s) performed:  Procedures    Critical Care performed: no ____________________________________________   INITIAL IMPRESSION / ASSESSMENT AND PLAN / ED COURSE  Pertinent labs & imaging results that were available during my care of the patient were reviewed by me and considered in  my medical decision making (see chart for details).   DDX: cellulitis, edema, venous stasis, chf, pe, dvt  Rocklin Omura is a 85 y.o. who presents to the ED with symptoms presenting for leg swelling.  Patient had d-dimer ordered at urgent care.  Suspect this is elevated secondary to recent fracture or trauma as well as venous stasis probably with a component of some cellulitic changes which I will give antibiotics however it is markedly elevated and he does have a risk factors and probable lytic lesion.  Will order lower extremity ultrasound as well as CTA to confirm by anticipate no evidence of DVT or PE.  Patient will be signed out to oncoming physician pending results of CTA.  If negative anticipate discharge home.     The patient was evaluated in Emergency Department today for the  symptoms described in the history of present illness. He/she was evaluated in the context of the global COVID-19 pandemic, which necessitated consideration that the patient might be at risk for infection with the SARS-CoV-2 virus that causes COVID-19. Institutional protocols and algorithms that pertain to the evaluation of patients at risk for COVID-19 are in a state of rapid change based on information released by regulatory bodies including the CDC and federal and state organizations. These policies and algorithms were followed during the patient's care in the ED.  As part of my medical decision making, I reviewed the following data within the [facility] notes reviewed and incorporated, Labs reviewed, notes from prior ED visits and [place] Controlled Substance Database   ____________________________________________   FINAL CLINICAL IMPRESSION(S) / ED DIAGNOSES  Final diagnoses:  Peripheral edema  Leg swelling      NEW MEDICATIONS STARTED DURING THIS VISIT:  New Prescriptions   CEPHALEXIN (KEFLEX) 500 MG CAPSULE    Take 1 capsule (500 mg total) by mouth 3 (three) times daily for 7 days.     Note:  This document was prepared using Dragon voice recognition software and may include unintentional dictation errors.    Merlyn Lot, MD 04/01/19 2325

## 2019-04-01 NOTE — ED Notes (Signed)
Called Adam Moss and updated her on plan of care.

## 2019-04-01 NOTE — ED Provider Notes (Signed)
[facility]    CSN: [id number] Arrival date & time: 04/01/19  1225      History   Chief Complaint Chief Complaint  Patient presents with   Leg Swelling    HPI Adam Moss is a 85 y.o. male.   Patient is a 85 year old male with past medical history of hypertension, hyperlipidemia, skin cancer and Parkinson's disease who presents with complaint of bilateral leg redness and swelling.  Patient was seen at [facility] on August 25 for right clavicular pain that happened when patient was trying to push himself up from sitting.  X-ray was done showing a midclavicular fracture that will appear to be associated with a lytic lesion that had a cystic appearance.  Given concern for possible metastatic process, patient was referred to oncology and has a appointment this Wednesday.  Per the patient's daughter-in-law, his swelling was really first noted when he injured his shoulder and has worsened since then.  Patient sleeps sitting up due to his shoulder pain because it is much easier for him to get up.  He has been trying to wear compression stockings as was recommended him but he has trouble getting them on and needs assistance from family or aids at the assisted living center.  Patient also has Parkinson's which causes tremors on his left side.  Couple days ago patient noted some redness to his lower legs and also some weeping.  Patient was seen by his primary care doctor on Wednesday and was started on Lasix after a Lasix challenge.  He reports she is now taking 20 mg of Lasix a day.  He states he took it yesterday but did not take it today.  He called his PCPs office on Saturday and after speaking with triage nurse was advised to go to the ER or urgent care center to be evaluated.  Patient states he came to urgent care because he would be required to quarantine for 14 days at his assisted living center if he goes to the ER and states that he needs to have his family to be able to come  and see him to help him with his ADLs as well as his compression stockings.  Per the daughter-in-law, the redness on the legs is worse today and that the redness did start prior to the compression stockings renews.  She reports that the swelling to the knee and above is worse today after the compression stockings were applied last night.     Past Medical History:  Diagnosis Date   Cancer ([place])    skin   Hyperlipemia    Hypertension    Parkinson disease ([place])    Parkinsonism ([place]) 02/21/2015   Spinal stenosis    Tremor     Patient Active Problem List   Diagnosis Date Noted   Pathologic fracture 03/30/2019   Cough 12/04/2015   Non-seasonal allergic rhinitis 12/04/2015   Parkinsonism ([place]) 02/21/2015   Spinal stenosis in cervical region 11/30/2013   Tremor 11/30/2013   Patient risk and functional assessment 04/14/2012   History of actinic keratoses 10/02/2010   History of nonmelanoma skin cancer 10/02/2010   Chest pain, unspecified 03/28/2007    Past Surgical History:  Procedure Laterality Date   APPENDECTOMY     CATARACT EXTRACTION     CHOLECYSTECTOMY     TONSILLECTOMY     VASECTOMY       Home Medications    Prior to Admission medications   Medication Sig Start Date End Date Taking? Authorizing  Provider  aspirin 81 MG tablet Take 81 mg by mouth daily.   Yes [provider]  BREO ELLIPTA 100-25 MCG/INH AEPB USE 1 INHALATION ORALLY    DAILY 03/05/19  Yes Kasa, Maretta Bees, MD  calcium carbonate (TUMS - DOSED IN MG ELEMENTAL CALCIUM) 500 MG chewable tablet Chew 1 tablet by mouth daily.   Yes [provider]  Calcium Citrate 250 MG TABS Take by mouth.   Yes [provider]  Carbidopa-Levodopa ER (RYTARY) 48.75-195 MG CPCR Take 195 mg by mouth 5 (five) times daily. 08/21/18  Yes Star Age, MD  cetirizine (ZYRTEC) 10 MG tablet Take 10 mg by mouth daily.   Yes [provider]  finasteride (PROSCAR) 5 MG tablet Take 5 mg  by mouth daily.   Yes [provider]  fluticasone (FLONASE) 50 MCG/ACT nasal spray Place into both nostrils daily.   Yes [provider]  ipratropium (ATROVENT) 0.03 % nasal spray Place 0.03 mLs into both nostrils 3 (three) times daily as needed. 05/25/17  Yes [provider]  niacin 500 MG tablet Take 500 mg by mouth every morning.    Yes [provider]  olmesartan (BENICAR) 20 MG tablet Take 20 mg by mouth daily.   Yes [provider]  omeprazole (PRILOSEC) 40 MG capsule Take 40 mg by mouth daily.   Yes [provider]  PROAIR HFA 108 (90 Base) MCG/ACT inhaler INHALE 2 PUFFS INTO THE LUNGS AS NEEDED. 03/30/18  Yes Kasa, Maretta Bees, MD  Simethicone (GAS RELIEF PO) Take by mouth.   Yes [provider]  simvastatin (ZOCOR) 10 MG tablet Take 10 mg by mouth daily.   Yes [provider]  tamsulosin (FLOMAX) 0.4 MG CAPS capsule Take 0.4 mg by mouth.   Yes [provider]  vitamin C (ASCORBIC ACID) 500 MG tablet Take 500 mg by mouth daily.   Yes [provider]    Family History Family History  Problem Relation Age of Onset   Cancer Mother    Heart disease Father     Social History Social History   Tobacco Use   Smoking status: Never Smoker   Smokeless tobacco: Never Used  Substance Use Topics   Alcohol use: No    Alcohol/week: 0.0 standard drinks   Drug use: No     Allergies   Carbidopa-levodopa, Iodinated diagnostic agents, Oxycodone-acetaminophen, Tylenol [acetaminophen], Tyloxapol, and Pramipexole   Review of Systems Review of Systems as noted above in HPI.  Other systems reviewed and found to be negative.  Patient denies any shortness of breath or abdominal pain.   Physical Exam Triage Vital Signs ED Triage Vitals  Enc Vitals Group     BP 04/01/19 1239 (!) 158/133     Pulse Rate 04/01/19 1239 (!) 126     Resp 04/01/19 1239 18     Temp 04/01/19 1239 98.3 F (36.8 C)     Temp  Source 04/01/19 1239 Oral     SpO2 04/01/19 1239 96 %     Weight 04/01/19 1247 192 lb (87.1 kg)     Height --      Head Circumference --      Peak Flow --      Pain Score 04/01/19 1246 0     Pain Loc --      Pain Edu? --      Excl. in [place]? --    No data found.  Updated Vital Signs BP (!) 158/133 (BP Location: Left Arm)  Pulse (!) 126    Temp 98.3 F (36.8 C) (Oral)    Resp 18    Wt 192 lb (87.1 kg)    SpO2 96%    BMI 27.55 kg/m    Physical Exam Constitutional:      General: He is not in acute distress.    Appearance: Normal appearance. He is not toxic-appearing.  HENT:     Head: Normocephalic and atraumatic.  Cardiovascular:     Rate and Rhythm: Regular rhythm. Tachycardia present.     Pulses: Normal pulses.     Comments: 3+ to near K+10 near anasarca anasarca follow-up edema edema to the bilateral lower extremity with edema noted to mid thigh bilaterally.  Cap refill 2-3 seconds.  Weeping noted through the skin. Pulmonary:     Effort: Pulmonary effort is normal.     Breath sounds: Normal breath sounds.  Abdominal:     General: Bowel sounds are normal.     Palpations: Abdomen is soft.  Musculoskeletal:     Right lower leg: Edema present.     Left lower leg: Edema present.  Skin:    General: Skin is warm and moist.     Comments: Some erythema noted to the lower legs bilaterally with some warmth but nontender.  Concerning for some cellulitis.  Neurological:     General: No focal deficit present.     Mental Status: He is alert.     Comments: Tremors to L side. RUE in sling      UC Treatments / Results  Labs (all labs ordered are listed, but only abnormal results are displayed) Labs Reviewed  FIBRIN DERIVATIVES D-DIMER ([facility] ONLY) - Abnormal; Notable for the following components:      Result Value   Fibrin derivatives D-dimer [facility]) 3,310.81 (*)    All other components within normal limits  COMPREHENSIVE METABOLIC PANEL - Abnormal; Notable for the following  components:   Sodium 134 (*)    Glucose, Bld 105 (*)    BUN 28 (*)    All other components within normal limits  CBC - Abnormal; Notable for the following components:   RBC 4.16 (*)    Hemoglobin 12.5 (*)    HCT 36.2 (*)    RDW 15.9 (*)    All other components within normal limits    EKG   Radiology No results found.  Procedures Procedures (including critical care time)  Medications Ordered in UC Medications - No data to display  Initial Impression / Assessment and Plan / UC Course  I have reviewed the triage vital signs and the nursing notes.  Pertinent labs & imaging results that were available during my care of the patient were reviewed by me and considered in my medical decision making (see chart for details).    Again, patient with bilateral lower extremity edema up to mid thigh.  Weeping noted to the skin.  Is some evidence of cellulitis based on exam.  Patient is on Lasix 20 mg daily but states he did not take it today.  I do not see any echocardiogram available in epic or care everywhere.  There is concern for cancer given the lytic lesion noted on x-ray at [facility] recently.  Per family, the swelling has worsened since his injury.  He has been mostly sessile, sleeping well sitting up.  Not getting up and moving around as much as normal.  Differential for his swelling includes his recent sessile state and not getting up much while sleeping  upright, cardiac in nature depending on his heart function but again no echo is available, or possibility of DVTs related to combination of his being sessile as well as possible cancer related in the setting of the known lytic lesion.  Patient is on baby aspirin at home.  Will check a CMP that his doctor had requested as well as a CBC and d-dimer.  CMP, glucose 105, BUN 28.  CBC with mild anemia with hemoglobin 12.5 and hematocrit 36.2.  Patient's d-dimer however is elevated at 3300, upper limit of normal of 500.  Given his greatly  elevated d-dimer, that strengthens my concern of DVT or other clot.  Will recommend that he go to the ER to be evaluated including ultrasound and possible echocardiogram and CT.  Ultrasound to evaluate for DVT clot, echo to evaluate for any cardiac dysfunction that may be leading to the edema as well and a CT scan to evaluate for any cancerous process that may be tied to the lesion noted on x-ray.  Final Clinical Impressions(s) / UC Diagnoses   Final diagnoses:  Bilateral lower extremity edema  Lytic lesion of bone on x-ray  Cellulitis of lower extremity, unspecified laterality     Discharge Instructions     -given your symptoms with swelling and the elevated d-dimer lab, there is strong suspicion that you have blood clots causing your swelling.  This needs evaluated in the ER with likely initiation of treatment at that point as well.    ED Prescriptions    None     Controlled Substance Prescriptions [place] Controlled Substance Registry consulted? Not Applicable   Luvenia Redden, PA-C 04/01/19 1430

## 2019-04-02 ENCOUNTER — Inpatient Hospital Stay (HOSPITAL_COMMUNITY)
Admit: 2019-04-02 | Discharge: 2019-04-02 | Disposition: A | Discharge status: Home / Self Care | Payer: Medicare Other | Attending: Internal Medicine | Admitting: Internal Medicine

## 2019-04-02 DIAGNOSIS — G20 Parkinson's disease: Secondary | ICD-10-CM

## 2019-04-02 DIAGNOSIS — Z7951 Long term (current) use of inhaled steroids: Secondary | ICD-10-CM | POA: Diagnosis not present

## 2019-04-02 DIAGNOSIS — I10 Essential (primary) hypertension: Secondary | ICD-10-CM

## 2019-04-02 DIAGNOSIS — I890 Lymphedema, not elsewhere classified: Secondary | ICD-10-CM | POA: Diagnosis not present

## 2019-04-02 DIAGNOSIS — N400 Enlarged prostate without lower urinary tract symptoms: Secondary | ICD-10-CM | POA: Diagnosis present

## 2019-04-02 DIAGNOSIS — E876 Hypokalemia: Secondary | ICD-10-CM | POA: Diagnosis present

## 2019-04-02 DIAGNOSIS — Z85828 Personal history of other malignant neoplasm of skin: Secondary | ICD-10-CM | POA: Diagnosis not present

## 2019-04-02 DIAGNOSIS — S42301A Unspecified fracture of shaft of humerus, right arm, initial encounter for closed fracture: Secondary | ICD-10-CM | POA: Diagnosis not present

## 2019-04-02 DIAGNOSIS — E785 Hyperlipidemia, unspecified: Secondary | ICD-10-CM | POA: Diagnosis present

## 2019-04-02 DIAGNOSIS — R609 Edema, unspecified: Secondary | ICD-10-CM | POA: Diagnosis not present

## 2019-04-02 DIAGNOSIS — M4800 Spinal stenosis, site unspecified: Secondary | ICD-10-CM | POA: Diagnosis present

## 2019-04-02 DIAGNOSIS — I872 Venous insufficiency (chronic) (peripheral): Secondary | ICD-10-CM | POA: Diagnosis not present

## 2019-04-02 DIAGNOSIS — R262 Difficulty in walking, not elsewhere classified: Secondary | ICD-10-CM | POA: Diagnosis not present

## 2019-04-02 DIAGNOSIS — X500XXD Overexertion from strenuous movement or load, subsequent encounter: Secondary | ICD-10-CM | POA: Diagnosis not present

## 2019-04-02 DIAGNOSIS — Y92230 Patient room in hospital as the place of occurrence of the external cause: Secondary | ICD-10-CM | POA: Diagnosis present

## 2019-04-02 DIAGNOSIS — Z7982 Long term (current) use of aspirin: Secondary | ICD-10-CM | POA: Diagnosis not present

## 2019-04-02 DIAGNOSIS — Z9849 Cataract extraction status, unspecified eye: Secondary | ICD-10-CM | POA: Diagnosis not present

## 2019-04-02 DIAGNOSIS — I351 Nonrheumatic aortic (valve) insufficiency: Secondary | ICD-10-CM | POA: Diagnosis not present

## 2019-04-02 DIAGNOSIS — Z20828 Contact with and (suspected) exposure to other viral communicable diseases: Secondary | ICD-10-CM | POA: Diagnosis present

## 2019-04-02 DIAGNOSIS — Z885 Allergy status to narcotic agent status: Secondary | ICD-10-CM | POA: Diagnosis not present

## 2019-04-02 DIAGNOSIS — M899 Disorder of bone, unspecified: Secondary | ICD-10-CM | POA: Diagnosis present

## 2019-04-02 DIAGNOSIS — Z888 Allergy status to other drugs, medicaments and biological substances status: Secondary | ICD-10-CM | POA: Diagnosis not present

## 2019-04-02 DIAGNOSIS — Z9852 Vasectomy status: Secondary | ICD-10-CM | POA: Diagnosis not present

## 2019-04-02 DIAGNOSIS — R600 Localized edema: Secondary | ICD-10-CM | POA: Diagnosis present

## 2019-04-02 DIAGNOSIS — I878 Other specified disorders of veins: Secondary | ICD-10-CM | POA: Diagnosis present

## 2019-04-02 DIAGNOSIS — Z79899 Other long term (current) drug therapy: Secondary | ICD-10-CM | POA: Diagnosis not present

## 2019-04-02 DIAGNOSIS — Z9049 Acquired absence of other specified parts of digestive tract: Secondary | ICD-10-CM | POA: Diagnosis not present

## 2019-04-02 DIAGNOSIS — L03115 Cellulitis of right lower limb: Secondary | ICD-10-CM | POA: Diagnosis present

## 2019-04-02 DIAGNOSIS — S42301D Unspecified fracture of shaft of humerus, right arm, subsequent encounter for fracture with routine healing: Secondary | ICD-10-CM | POA: Diagnosis not present

## 2019-04-02 DIAGNOSIS — L03116 Cellulitis of left lower limb: Secondary | ICD-10-CM | POA: Diagnosis present

## 2019-04-02 DIAGNOSIS — L0390 Cellulitis, unspecified: Secondary | ICD-10-CM | POA: Diagnosis not present

## 2019-04-02 DIAGNOSIS — T501X5A Adverse effect of loop [high-ceiling] diuretics, initial encounter: Secondary | ICD-10-CM | POA: Diagnosis present

## 2019-04-02 DIAGNOSIS — I952 Hypotension due to drugs: Secondary | ICD-10-CM | POA: Diagnosis present

## 2019-04-02 LAB — ECHOCARDIOGRAM COMPLETE
Height: 70 in
Weight: 3113.6 oz

## 2019-04-02 LAB — TSH: TSH: 0.356 u[IU]/mL (ref 0.350–4.500)

## 2019-04-02 MED ORDER — FLUTICASONE PROPIONATE 50 MCG/ACT NA SUSP
1.0000 | Freq: Every day | NASAL | Status: DC
  Filled 2019-04-02: qty 16

## 2019-04-02 MED ORDER — NIACIN 500 MG PO TABS
500.0000 mg | ORAL_TABLET | Freq: Every day | ORAL | Status: DC
  Administered 2019-04-02: 500 mg via ORAL
  Filled 2019-04-02 (×3): qty 1

## 2019-04-02 MED ORDER — PANTOPRAZOLE SODIUM 40 MG PO TBEC
80.0000 mg | DELAYED_RELEASE_TABLET | Freq: Every day | ORAL | Status: DC
  Administered 2019-04-02 – 2019-04-03 (×2): 80 mg via ORAL
  Filled 2019-04-02 (×2): qty 2

## 2019-04-02 MED ORDER — FLUTICASONE FUROATE-VILANTEROL 100-25 MCG/INH IN AEPB
1.0000 | INHALATION_SPRAY | Freq: Every day | RESPIRATORY_TRACT | Status: DC
  Administered 2019-04-02 – 2019-04-03 (×2): 1 via RESPIRATORY_TRACT
  Filled 2019-04-02 (×2): qty 28

## 2019-04-02 MED ORDER — VITAMIN C 500 MG PO TABS
500.0000 mg | ORAL_TABLET | Freq: Every day | ORAL | Status: DC
  Administered 2019-04-02 – 2019-04-03 (×2): 500 mg via ORAL
  Filled 2019-04-02 (×2): qty 1

## 2019-04-02 MED ORDER — FUROSEMIDE 10 MG/ML IJ SOLN
40.0000 mg | Freq: Once | INTRAMUSCULAR | Status: AC
  Administered 2019-04-02: 40 mg via INTRAVENOUS
  Filled 2019-04-02: qty 4

## 2019-04-02 MED ORDER — LORATADINE 10 MG PO TABS
10.0000 mg | ORAL_TABLET | Freq: Every day | ORAL | Status: DC
  Administered 2019-04-02 – 2019-04-03 (×2): 10 mg via ORAL
  Filled 2019-04-02 (×2): qty 1

## 2019-04-02 MED ORDER — IRBESARTAN 150 MG PO TABS
150.0000 mg | ORAL_TABLET | Freq: Every day | ORAL | Status: DC
  Administered 2019-04-02: 150 mg via ORAL
  Filled 2019-04-02: qty 1

## 2019-04-02 MED ORDER — CEFAZOLIN SODIUM-DEXTROSE 2-4 GM/100ML-% IV SOLN
2.0000 g | Freq: Once | INTRAVENOUS | Status: AC
  Administered 2019-04-02: 2 g via INTRAVENOUS
  Filled 2019-04-02: qty 100

## 2019-04-02 MED ORDER — CARBIDOPA-LEVODOPA ER 48.75-195 MG PO CPCR
195.0000 mg | ORAL_CAPSULE | Freq: Every day | ORAL | Status: DC
  Administered 2019-04-02 – 2019-04-03 (×5): 195 mg via ORAL
  Filled 2019-04-02 (×3): qty 100
  Filled 2019-04-02 (×2): qty 1
  Filled 2019-04-02 (×2): qty 100

## 2019-04-02 MED ORDER — FUROSEMIDE 10 MG/ML IJ SOLN: 40.0000 mg | Freq: Four times a day (QID) | INTRAMUSCULAR | Status: DC

## 2019-04-02 MED ORDER — ENOXAPARIN SODIUM 40 MG/0.4ML ~~LOC~~ SOLN
40.0000 mg | Freq: Every day | SUBCUTANEOUS | Status: DC
  Administered 2019-04-02: 40 mg via SUBCUTANEOUS
  Filled 2019-04-02 (×2): qty 0.4

## 2019-04-02 MED ORDER — SODIUM CHLORIDE 0.9 % IV SOLN
INTRAVENOUS | Status: DC | PRN
  Administered 2019-04-02: 13:00:00 via INTRAVENOUS

## 2019-04-02 MED ORDER — ONDANSETRON HCL 4 MG/2ML IJ SOLN: 4.0000 mg | Freq: Four times a day (QID) | INTRAMUSCULAR | Status: DC | PRN

## 2019-04-02 MED ORDER — DOCUSATE SODIUM 100 MG PO CAPS
100.0000 mg | ORAL_CAPSULE | Freq: Two times a day (BID) | ORAL | Status: DC
  Administered 2019-04-02 – 2019-04-03 (×2): 100 mg via ORAL
  Filled 2019-04-02 (×3): qty 1

## 2019-04-02 MED ORDER — ONDANSETRON HCL 4 MG PO TABS: 4.0000 mg | ORAL_TABLET | Freq: Four times a day (QID) | ORAL | Status: DC | PRN

## 2019-04-02 MED ORDER — ASPIRIN EC 81 MG PO TBEC
81.0000 mg | DELAYED_RELEASE_TABLET | Freq: Every day | ORAL | Status: DC
  Administered 2019-04-02 – 2019-04-03 (×2): 81 mg via ORAL
  Filled 2019-04-02 (×2): qty 1

## 2019-04-02 MED ORDER — FINASTERIDE 5 MG PO TABS
5.0000 mg | ORAL_TABLET | Freq: Every day | ORAL | Status: DC
  Administered 2019-04-02 – 2019-04-03 (×2): 5 mg via ORAL
  Filled 2019-04-02 (×2): qty 1

## 2019-04-02 MED ORDER — CEFAZOLIN SODIUM-DEXTROSE 1-4 GM/50ML-% IV SOLN
1.0000 g | Freq: Three times a day (TID) | INTRAVENOUS | Status: DC
  Administered 2019-04-02 – 2019-04-03 (×4): 1 g via INTRAVENOUS
  Filled 2019-04-02 (×7): qty 50

## 2019-04-02 MED ORDER — CEPHALEXIN 500 MG PO CAPS
500.0000 mg | ORAL_CAPSULE | Freq: Four times a day (QID) | ORAL | Status: DC
  Administered 2019-04-02: 500 mg via ORAL
  Filled 2019-04-02: qty 1

## 2019-04-02 MED ORDER — FUROSEMIDE 10 MG/ML IJ SOLN
40.0000 mg | Freq: Two times a day (BID) | INTRAMUSCULAR | Status: DC
  Administered 2019-04-02: 40 mg via INTRAVENOUS
  Filled 2019-04-02: qty 4

## 2019-04-02 MED ORDER — SODIUM CHLORIDE 0.9 % IV BOLUS
250.0000 mL | Freq: Once | INTRAVENOUS | Status: AC
  Administered 2019-04-02: 250 mL via INTRAVENOUS

## 2019-04-02 MED ORDER — TAMSULOSIN HCL 0.4 MG PO CAPS
0.4000 mg | ORAL_CAPSULE | Freq: Every day | ORAL | Status: DC
  Administered 2019-04-02 – 2019-04-03 (×2): 0.4 mg via ORAL
  Filled 2019-04-02 (×2): qty 1

## 2019-04-02 MED ORDER — SIMVASTATIN 10 MG PO TABS
10.0000 mg | ORAL_TABLET | Freq: Every day | ORAL | Status: DC
  Administered 2019-04-02: 10 mg via ORAL
  Filled 2019-04-02 (×2): qty 1

## 2019-04-02 NOTE — H&P (Signed)
Adam Moss is an 85 y.o. male.   Chief Complaint: Leg swelling HPI: The patient with past medical history of Parkinson disease, hypertension hyperlipidemia presents to the emergency department complaining of swelling of his left leg.  The patient was seen at urgent care earlier today who recommended evaluation in the emergency department due to elevated d-dimer.  The patient reports that he has been unable to ambulate well due to difficulty bending his lower extremity.  The patient was started on Lasix for lower extremity edema a number of weeks ago.  His swelling has worsened.  He denies pain in his leg except for when he tries to bend the knee at an acute angle.  The patient was given Lasix in the emergency department prior to the emergency department staff called the hospitalist service for admission.  Past Medical History:  Diagnosis Date  . Cancer ([place])    skin  . Hyperlipemia   . Hypertension   . Parkinson disease ([place])   . Parkinsonism ([place]) 02/21/2015  . Spinal stenosis   . Tremor     Past Surgical History:  Procedure Laterality Date  . APPENDECTOMY    . CATARACT EXTRACTION    . CHOLECYSTECTOMY    . TONSILLECTOMY    . VASECTOMY      Family History  Problem Relation Age of Onset  . Cancer Mother   . Heart disease Father    Social History:  reports that he has never smoked. He has never used smokeless tobacco. He reports that he does not drink alcohol or use drugs.  Allergies:  Allergies  Allergen Reactions  . Carbidopa-Levodopa Other (See Comments)    Severe stomach pains, can take rytary  . Iodinated Diagnostic Agents     Pt is unsure of why this allergy is listed.  . Oxycodone-Acetaminophen Other (See Comments)    "boils on skin"  . Tylenol [Acetaminophen]   . Tyloxapol   . Pramipexole Nausea Only    Medications Prior to Admission  Medication Sig Dispense Refill  . aspirin 81 MG tablet Take 81 mg by mouth daily.    Marland Kitchen BREO ELLIPTA 100-25 MCG/INH AEPB USE  1 INHALATION ORALLY    DAILY 60 each 1  . calcium carbonate (TUMS - DOSED IN MG ELEMENTAL CALCIUM) 500 MG chewable tablet Chew 1 tablet by mouth daily.    . Calcium Citrate 250 MG TABS Take by mouth.    . Carbidopa-Levodopa ER (RYTARY) 48.75-195 MG CPCR Take 195 mg by mouth 5 (five) times daily. 450 capsule 3  . cetirizine (ZYRTEC) 10 MG tablet Take 10 mg by mouth daily.    . finasteride (PROSCAR) 5 MG tablet Take 5 mg by mouth daily.    . fluticasone (FLONASE) 50 MCG/ACT nasal spray Place into both nostrils daily.    . furosemide (LASIX) 20 MG tablet Take 20 mg by mouth daily.     Marland Kitchen ipratropium (ATROVENT) 0.03 % nasal spray Place 0.03 mLs into both nostrils 3 (three) times daily as needed.  11  . niacin 500 MG tablet Take 500 mg by mouth every morning.     . olmesartan (BENICAR) 20 MG tablet Take 20 mg by mouth daily.    Marland Kitchen omeprazole (PRILOSEC) 40 MG capsule Take 40 mg by mouth daily.    Marland Kitchen PROAIR HFA 108 (90 Base) MCG/ACT inhaler INHALE 2 PUFFS INTO THE LUNGS AS NEEDED. 17 Inhaler 3  . Simethicone (GAS RELIEF PO) Take by mouth.    . simvastatin (ZOCOR) 10  MG tablet Take 10 mg by mouth daily.    . tamsulosin (FLOMAX) 0.4 MG CAPS capsule Take 0.4 mg by mouth daily.     . vitamin C (ASCORBIC ACID) 500 MG tablet Take 500 mg by mouth daily.      Results for orders placed or performed during the hospital encounter of 04/01/19 (from the past 48 hour(s))  Brain natriuretic peptide     Status: None   Collection Time: 04/01/19  4:25 PM  Result Value Ref Range   B Natriuretic Peptide 29.0 0.0 - 100.0 pg/mL    Comment: Performed at [facility], [address] [place], [place] [address]  SARS Coronavirus 2 [facility] order, Performed in [facility] hospital lab) Nasopharyngeal Nasopharyngeal Swab     Status: None   Collection Time: 04/01/19  6:58 PM   Specimen: Nasopharyngeal Swab  Result Value Ref Range   SARS Coronavirus 2 NEGATIVE NEGATIVE    Comment: (NOTE) If result is  NEGATIVE SARS-CoV-2 target nucleic acids are NOT DETECTED. The SARS-CoV-2 RNA is generally detectable in upper and lower  respiratory specimens during the acute phase of infection. The lowest  concentration of SARS-CoV-2 viral copies this assay can detect is 250  copies / mL. A negative result does not preclude SARS-CoV-2 infection  and should not be used as the sole basis for treatment or other  patient management decisions.  A negative result may occur with  improper specimen collection / handling, submission of specimen other  than nasopharyngeal swab, presence of viral mutation(s) within the  areas targeted by this assay, and inadequate number of viral copies  (<250 copies / mL). A negative result must be combined with clinical  observations, patient history, and epidemiological information. If result is POSITIVE SARS-CoV-2 target nucleic acids are DETECTED. The SARS-CoV-2 RNA is generally detectable in upper and lower  respiratory specimens dur ing the acute phase of infection.  Positive  results are indicative of active infection with SARS-CoV-2.  Clinical  correlation with patient history and other diagnostic information is  necessary to determine patient infection status.  Positive results do  not rule out bacterial infection or co-infection with other viruses. If result is PRESUMPTIVE POSTIVE SARS-CoV-2 nucleic acids MAY BE PRESENT.   A presumptive positive result was obtained on the submitted specimen  and confirmed on repeat testing.  While 2019 novel coronavirus  (SARS-CoV-2) nucleic acids may be present in the submitted sample  additional confirmatory testing may be necessary for epidemiological  and / or clinical management purposes  to differentiate between  SARS-CoV-2 and other Sarbecovirus currently known to infect humans.  If clinically indicated additional testing with an alternate test  methodology [phone]) is advised. The SARS-CoV-2 RNA is generally  detectable  in upper and lower respiratory sp ecimens during the acute  phase of infection. The expected result is Negative. Fact Sheet for Patients:  StrictlyIdeas.no Fact Sheet for Healthcare Providers: BankingDealers.co.za This test is not yet approved or cleared by the [place] FDA and has been authorized for detection and/or diagnosis of SARS-CoV-2 by FDA under an Emergency Use Authorization (EUA).  This EUA will remain in effect (meaning this test can be used) for the duration of the COVID-19 declaration under Section 564(b)(1) of the Act, 21 U.S.C. section 360bbb-3(b)(1), unless the authorization is terminated or revoked sooner. Performed at [facility], [place], [place] [address]    Ct Angio Chest Pe W And/or Wo Contrast  Result Date: 04/01/2019 CLINICAL DATA:  Bilateral leg swelling and discomfort EXAM: CT ANGIOGRAPHY CHEST WITH CONTRAST TECHNIQUE: Multidetector CT imaging of the chest was performed using the standard protocol during bolus administration of intravenous contrast. Multiplanar CT image reconstructions and MIPs were obtained to evaluate the vascular anatomy. CONTRAST:  28mL OMNIPAQUE IOHEXOL 350 MG/ML SOLN COMPARISON:  None. FINDINGS: Cardiovascular: There is a optimal opacification of the pulmonary arteries. There is no central,segmental, or subsegmental filling defects within the pulmonary arteries. There is mild cardiomegaly. No pericardial effusion thickening. No evidence right heart strain. There is normal three-vessel brachiocephalic anatomy without proximal stenosis. There is a tortuous descending aorta. Mediastinum/Nodes: No hilar, mediastinal, or axillary adenopathy. Thyroid gland, trachea, and esophagus demonstrate no significant findings. Lungs/Pleura: There is minimal amount of bibasilar dependent atelectasis. No pleural effusion or pneumothorax. No airspace consolidation. Upper Abdomen: Surgical  clips in the right upper quadrant. No acute abnormalities present in the visualized portions of the upper abdomen. Musculoskeletal: No chest wall abnormality. There is rounded lucent lesion seen within the T6 and T3 vertebral bodies, which are nonspecific. Review of the MIP images confirms the above findings. IMPRESSION: No central, segmental, or subsegmental pulmonary embolism. Mild cardiomegaly No acute intrathoracic pathology. Electronically Signed   By: Prudencio Pair M.D.   On: 04/01/2019 23:38   US Venous Img Lower Bilateral  Result Date: 04/01/2019 CLINICAL DATA:  Leg swelling, evaluate for DVT EXAM: BILATERAL LOWER EXTREMITY VENOUS DOPPLER ULTRASOUND TECHNIQUE: Gray-scale sonography with graded compression, as well as color Doppler and duplex ultrasound were performed to evaluate the lower extremity deep venous systems from the level of the common femoral vein and including the common femoral, femoral, profunda femoral, popliteal and calf veins including the posterior tibial, peroneal and gastrocnemius veins when visible. The superficial great saphenous vein was also interrogated. Spectral Doppler was utilized to evaluate flow at rest and with distal augmentation maneuvers in the common femoral, femoral and popliteal veins. COMPARISON:  None. FINDINGS: RIGHT LOWER EXTREMITY Common Femoral Vein: No evidence of thrombus. Normal compressibility, respiratory phasicity and response to augmentation. Saphenofemoral Junction: No evidence of thrombus. Normal compressibility and flow on color Doppler imaging. Profunda Femoral Vein: No evidence of thrombus. Normal compressibility and flow on color Doppler imaging. Femoral Vein: No evidence of thrombus. Normal compressibility, respiratory phasicity and response to augmentation. Popliteal Vein: No evidence of thrombus. Normal compressibility, respiratory phasicity and response to augmentation. Calf Veins: Limited assessment.  No definite thrombus. Venous Reflux:  None.  Other Findings:  None. LEFT LOWER EXTREMITY Common Femoral Vein: No evidence of thrombus. Normal compressibility, respiratory phasicity and response to augmentation. Saphenofemoral Junction: No evidence of thrombus. Normal compressibility and flow on color Doppler imaging. Profunda Femoral Vein: No evidence of thrombus. Normal compressibility and flow on color Doppler imaging. Femoral Vein: No evidence of thrombus. Normal compressibility, respiratory phasicity and response to augmentation. Popliteal Vein: No evidence of thrombus. Normal compressibility, respiratory phasicity and response to augmentation. Calf Veins: Limited assessment.  No definite thrombus. Venous Reflux:  None. Other Findings:  None. IMPRESSION: No evidence of deep venous thrombosis in either lower extremity to the level of the popliteal vein. Limited assessment of the calf veins but no definite thrombus. Electronically Signed   By: Audie Pinto M.D.   On: 04/01/2019 18:42   Dg Chest Portable 1 View  Result Date: 04/01/2019 CLINICAL DATA:  Leg swelling.  Evaluate for cardiomegaly. EXAM: PORTABLE CHEST 1 VIEW COMPARISON:  06/14/2017 FINDINGS: Normal heart size. Tortuous, unfolded aorta. No pleural effusion or edema. No airspace opacities  identified. IMPRESSION: Normal heart size. 1. Tortuous unfolded aorta which may be the sequelae of chronic hypertension. Electronically Signed   By: Kerby Moors M.D.   On: 04/01/2019 17:10    Review of Systems  Constitutional: Negative for chills and fever.  HENT: Negative for sore throat and tinnitus.   Eyes: Negative for blurred vision and redness.  Respiratory: Negative for cough and shortness of breath.   Cardiovascular: Positive for leg swelling. Negative for chest pain, palpitations, orthopnea and PND.  Gastrointestinal: Negative for abdominal pain, diarrhea, nausea and vomiting.  Genitourinary: Negative for dysuria, frequency and urgency.  Musculoskeletal: Negative for joint pain and  myalgias.  Skin: Negative for rash.       No lesions  Neurological: Negative for speech change, focal weakness and weakness.  Endo/Heme/Allergies: Does not bruise/bleed easily.       No temperature intolerance  Psychiatric/Behavioral: Negative for depression and suicidal ideas.    Blood pressure 123/86, pulse 72, temperature (!) 97.4 F (36.3 C), temperature source Oral, resp. rate 20, height 5\' 10"  (1.778 m), weight 88.3 kg, SpO2 97 %. Physical Exam  Vitals reviewed. Constitutional: He is oriented to person, place, and time. He appears well-developed and well-nourished. No distress.  HENT:  Head: Normocephalic and atraumatic.  Mouth/Throat: Oropharynx is clear and moist.  Eyes: Pupils are equal, round, and reactive to light. Conjunctivae and EOM are normal. No scleral icterus.  Neck: Normal range of motion. Neck supple. No JVD present. No tracheal deviation present. No thyromegaly present.  Cardiovascular: Normal rate, regular rhythm and normal heart sounds. Exam reveals no gallop and no friction rub.  No murmur heard. Respiratory: Effort normal and breath sounds normal. No respiratory distress.  GI: Soft. Bowel sounds are normal. He exhibits no distension. There is no abdominal tenderness.  Genitourinary:    Genitourinary Comments: Deferred   Musculoskeletal: Normal range of motion.        General: Edema present.  Lymphadenopathy:    He has no cervical adenopathy.  Neurological: He is alert and oriented to person, place, and time. No cranial nerve deficit.  Skin: Skin is warm and dry. No rash noted. No erythema.  Psychiatric: He has a normal mood and affect. His behavior is normal. Judgment and thought content normal.     Assessment/Plan This is an 85 year old male admitted for cellulitis. 1.  Cellulitis: Likely related to venous stasis.  No indication of sepsis.  Continue Keflex p.o. 2.  Lower extremity edema: Relatively new onset.  Worse than when patient started Lasix a  number of weeks ago.  No history of heart failure.  BNP is within normal limits at this time.  Nonetheless, consult cardiology.  Obtain echocardiogram. 3.  Hypertension: Controlled; continue ARB. 4.  Parkinson disease: Stable; continue Sinemet. 5.  BPH: Continue tamsulosin and finasteride 6.  Hyperlipidemia: Continue statin therapy 7.  DVT prophylaxis: Lovenox 8.  GI prophylaxis: Pantoprazole per home regimen The patient is a full code.  Time spent on admission orders and patient care approximately 45 minutes  Harrie Foreman, MD 04/02/2019, 5:33 AM

## 2019-04-02 NOTE — Consult Note (Addendum)
Cardiology Consultation:   Patient ID: Adam Moss MRN: [id number]; DOB: 1933-12-10  Admit date: 04/01/2019 Date of Consult: 04/02/2019  Primary Care Provider: Finis Bud, MD Primary Cardiologist: New to [facility] Physician requesting consult: Dr. Marcille Blanco Reason for consult lower extremity edema  Patient Profile:   Adam Moss is a 85 y.o. male with a hx of chronic leg edema, Parkinson's/tremor, weakness, hypertension, hyperlipidemia, presenting to the hospital with leg edema  History of Present Illness:   [name] reports having a  " cyst in the right clavicle August 2020".  This has caused pain, had associated fracture in this area, seen by orthopedics.  He has been referred to oncology for further evaluation, concern for malignancy.  Limited use of the right arm secondary to discomfort.  Since that time has been more debilitated.  Has been sleeping with his legs down, not able to lay supine in bed He is also been sitting most of the day, relatively inactive secondary to his clavicular discomfort, and quarantine  He tried to get help at home but after an interview with a potential candidate, the candidate tested positive for COVID and he was quarantined for several weeks  Unable to put his compressions hose on himself given his right arm limitations.  Denies any significant shortness of breath on exertion, no abdominal discomfort Feels his lower extremity edema is chronic, just worse recently Was given Lasix by primary care Was told to take one if that did not work take 2 if that did not work double it He ended up taking 8 pills with no significant urine output Appears he has Lasix 20 mg daily  Was seen by urgent care, referred to emergency room, admitted for leg edema with no other significant symptoms  In the hospital compression hose applied Was given IV Lasix emergency room and this morning Blood pressure low 90 systolic Notes indicating -1.7 L BNP in the  20s  Echocardiogram personally reviewed by myself showing normal LV function, small IVC, no indication of elevated right heart pressures consistent with fluid overload or pulmonary hypertension  Heart Pathway Score:     Past Medical History:  Diagnosis Date  . Cancer ([place])    skin  . Hyperlipemia   . Hypertension   . Parkinson disease ([place])   . Parkinsonism ([place]) 02/21/2015  . Spinal stenosis   . Tremor     Past Surgical History:  Procedure Laterality Date  . APPENDECTOMY    . CATARACT EXTRACTION    . CHOLECYSTECTOMY    . TONSILLECTOMY    . VASECTOMY       Home Medications:  Prior to Admission medications   Medication Sig Start Date End Date Taking? Authorizing Provider  aspirin 81 MG tablet Take 81 mg by mouth daily.   Yes [provider]  BREO ELLIPTA 100-25 MCG/INH AEPB USE 1 INHALATION ORALLY    DAILY 03/05/19  Yes Kasa, Maretta Bees, MD  calcium carbonate (TUMS - DOSED IN MG ELEMENTAL CALCIUM) 500 MG chewable tablet Chew 1 tablet by mouth daily.   Yes [provider]  Calcium Citrate 250 MG TABS Take by mouth.   Yes [provider]  Carbidopa-Levodopa ER (RYTARY) 48.75-195 MG CPCR Take 195 mg by mouth 5 (five) times daily. 08/21/18  Yes Star Age, MD  cetirizine (ZYRTEC) 10 MG tablet Take 10 mg by mouth daily.   Yes [provider]  finasteride (PROSCAR) 5 MG tablet Take 5 mg by mouth daily.   Yes [provider]  fluticasone (FLONASE) 50 MCG/ACT nasal spray Place into both nostrils daily.   Yes [provider]  furosemide (LASIX) 20 MG tablet Take 20 mg by mouth daily.  03/28/19  Yes [provider]  ipratropium (ATROVENT) 0.03 % nasal spray Place 0.03 mLs into both nostrils 3 (three) times daily as needed. 05/25/17  Yes [provider]  niacin 500 MG tablet Take 500 mg by mouth every morning.    Yes [provider]  olmesartan (BENICAR) 20 MG tablet Take 20 mg by mouth daily.   Yes [provider]  omeprazole (PRILOSEC) 40 MG capsule Take 40 mg by mouth daily.   Yes [provider]  PROAIR HFA 108 (90 Base) MCG/ACT inhaler INHALE 2 PUFFS INTO THE LUNGS AS NEEDED. 03/30/18  Yes Kasa, Maretta Bees, MD  Simethicone (GAS RELIEF PO) Take by mouth.   Yes [provider]  simvastatin (ZOCOR) 10 MG tablet Take 10 mg by mouth daily.   Yes [provider]  tamsulosin (FLOMAX) 0.4 MG CAPS capsule Take 0.4 mg by mouth daily.    Yes [provider]  vitamin C (ASCORBIC ACID) 500 MG tablet Take 500 mg by mouth daily.   Yes [provider]  cephALEXin (KEFLEX) 500 MG capsule Take 1 capsule (500 mg total) by mouth 3 (three) times daily for 7 days. 04/01/19 04/08/19  Merlyn Lot, MD    Inpatient Medications: Scheduled Meds: . aspirin EC  81 mg Oral Daily  . carbidopa-levodopa  1 tablet Oral BID  . docusate sodium  100 mg Oral BID  . enoxaparin (LOVENOX) injection  40 mg Subcutaneous Daily  . finasteride  5 mg Oral Daily  . fluticasone  1 spray Each Nare Daily  . fluticasone furoate-vilanterol  1 puff Inhalation Daily  . loratadine  10 mg Oral Daily  . niacin  500 mg Oral QHS  . pantoprazole  80 mg Oral Daily  . simvastatin  10 mg Oral q1800  . tamsulosin  0.4 mg Oral Daily  . vitamin C  500 mg Oral Daily   Continuous Infusions: . sodium chloride 10 mL/hr at 04/02/19 1307  .  ceFAZolin (ANCEF) IV 1 g (04/02/19 1308)   PRN Meds: sodium chloride, ondansetron **OR** ondansetron (ZOFRAN) IV  Allergies:    Allergies  Allergen Reactions  . Carbidopa-Levodopa Other (See Comments)    Severe stomach pains, can take rytary  . Iodinated Diagnostic Agents     Pt is unsure of why this allergy is listed.  . Oxycodone-Acetaminophen Other (See Comments)    "boils on skin"  . Tylenol [Acetaminophen]   . Tyloxapol   . Pramipexole Nausea Only    Social History:   Social History   Socioeconomic History  . Marital status: Married    Spouse  name: Not on file  . Number of children: 3  . Years of education: PhD  . Highest education level: Not on file  Occupational History  . Occupation: Retired  Scientific laboratory technician  . Financial resource strain: Not on file  . Food insecurity    Worry: Not on file    Inability: Not on file  . Transportation needs    Medical: Not on file    Non-medical: Not on file  Tobacco Use  . Smoking status: Never Smoker  . Smokeless tobacco: Never Used  Substance and Sexual Activity  . Alcohol use: No    Alcohol/week: 0.0 standard drinks  . Drug use: No  . Sexual activity: Not on  file  Lifestyle  . Physical activity    Days per week: Not on file    Minutes per session: Not on file  . Stress: Not on file  Relationships  . Social Herbalist on phone: Not on file    Gets together: Not on file    Attends religious service: Not on file    Active member of club or organization: Not on file    Attends meetings of clubs or organizations: Not on file    Relationship status: Not on file  . Intimate partner violence    Fear of current or ex partner: Not on file    Emotionally abused: Not on file    Physically abused: Not on file    Forced sexual activity: Not on file  Other Topics Concern  . Not on file  Social History Narrative   Denies caffeine use     Family History:    Family History  Problem Relation Age of Onset  . Cancer Mother   . Heart disease Father      ROS:  Please see the history of present illness.  Review of Systems  Constitutional: Positive for malaise/fatigue.  HENT: Negative.   Respiratory: Negative.   Cardiovascular: Positive for leg swelling.  Gastrointestinal: Negative.   Musculoskeletal: Negative.   Neurological: Negative.   Psychiatric/Behavioral: Negative.   All other systems reviewed and are negative.     Physical Exam/Data:   Vitals:   04/02/19 0420 04/02/19 0858 04/02/19 1126 04/02/19 1323  BP: 123/86 110/68 92/65 (!) 84/56  Pulse: 72 88 92  88  Resp: 20     Temp: (!) 97.4 F (36.3 C)  97.6 F (36.4 C)   TempSrc: Oral  Oral   SpO2: 97% 98% 95%   Weight: 88.3 kg     Height: 5\' 10"  (1.778 m)       Intake/Output Summary (Last 24 hours) at 04/02/2019 1407 Last data filed at 04/02/2019 1147 Gross per 24 hour  Intake 240 ml  Output 2000 ml  Net -1760 ml   Last 3 Weights 04/02/2019 04/01/2019 04/01/2019  Weight (lbs) 194 lb 9.6 oz 190 lb 192 lb  Weight (kg) 88.27 kg 86.183 kg 87.091 kg     Body mass index is 27.92 kg/m.  General:  Well nourished, well developed, in no acute distress HEENT: normal Lymph: no adenopathy Neck: no JVD Endocrine:  No thryomegaly Vascular: No carotid bruits; FA pulses 2+ bilaterally without bruits  Cardiac:  normal S1, S2; RRR; no murmur  1-2+ pitting edema bilateral lower extremities to the thighs Lungs:  clear to auscultation bilaterally, no wheezing, rhonchi or rales  Abd: soft, nontender, no hepatomegaly  Ext: no edema Musculoskeletal:  No deformities, BUE and BLE strength normal and equal Skin: warm and dry  Neuro:  CNs 2-12 intact, no focal abnormalities noted Psych:  Normal affect   EKG:  The EKG was personally reviewed and demonstrates:   Shows normal sinus rhythm with rate 80 bpm, baseline artifact from tremor  Telemetry:  Telemetry was personally reviewed and demonstrates: Normal sinus rhythm  Relevant CV Studies:   Laboratory Data:  High Sensitivity Troponin:  No results for input(s): TROPONINIHS in the last 720 hours.   Chemistry Recent Labs  Lab 04/01/19 1333  NA 134*  K 4.0  CL 100  CO2 25  GLUCOSE 105*  BUN 28*  CREATININE 0.79  CALCIUM 8.9  GFRNONAA >60  GFRAA >60  ANIONGAP 9  Recent Labs  Lab 04/01/19 1333  PROT 6.5  ALBUMIN 3.7  AST 19  ALT 7  ALKPHOS 65  BILITOT 1.0   Hematology Recent Labs  Lab 04/01/19 1333  WBC 5.6  RBC 4.16*  HGB 12.5*  HCT 36.2*  MCV 87.0  MCH 30.0  MCHC 34.5  RDW 15.9*  PLT 162   BNP Recent Labs  Lab 04/01/19  1625  BNP 29.0    DDimer No results for input(s): DDIMER in the last 168 hours.   Radiology/Studies:  Ct Angio Chest Pe W And/or Wo Contrast  Result Date: 04/01/2019 CLINICAL DATA:  Bilateral leg swelling and discomfort EXAM: CT ANGIOGRAPHY CHEST WITH CONTRAST TECHNIQUE: Multidetector CT imaging of the chest was performed using the standard protocol during bolus administration of intravenous contrast. Multiplanar CT image reconstructions and MIPs were obtained to evaluate the vascular anatomy. CONTRAST:  60mL OMNIPAQUE IOHEXOL 350 MG/ML SOLN COMPARISON:  None. FINDINGS: Cardiovascular: There is a optimal opacification of the pulmonary arteries. There is no central,segmental, or subsegmental filling defects within the pulmonary arteries. There is mild cardiomegaly. No pericardial effusion thickening. No evidence right heart strain. There is normal three-vessel brachiocephalic anatomy without proximal stenosis. There is a tortuous descending aorta. Mediastinum/Nodes: No hilar, mediastinal, or axillary adenopathy. Thyroid gland, trachea, and esophagus demonstrate no significant findings. Lungs/Pleura: There is minimal amount of bibasilar dependent atelectasis. No pleural effusion or pneumothorax. No airspace consolidation. Upper Abdomen: Surgical clips in the right upper quadrant. No acute abnormalities present in the visualized portions of the upper abdomen. Musculoskeletal: No chest wall abnormality. There is rounded lucent lesion seen within the T6 and T3 vertebral bodies, which are nonspecific. Review of the MIP images confirms the above findings. IMPRESSION: No central, segmental, or subsegmental pulmonary embolism. Mild cardiomegaly No acute intrathoracic pathology. Electronically Signed   By: Prudencio Pair M.D.   On: 04/01/2019 23:38   US Venous Img Lower Bilateral  Result Date: 04/01/2019 CLINICAL DATA:  Leg swelling, evaluate for DVT EXAM: BILATERAL LOWER EXTREMITY VENOUS DOPPLER ULTRASOUND  TECHNIQUE: Gray-scale sonography with graded compression, as well as color Doppler and duplex ultrasound were performed to evaluate the lower extremity deep venous systems from the level of the common femoral vein and including the common femoral, femoral, profunda femoral, popliteal and calf veins including the posterior tibial, peroneal and gastrocnemius veins when visible. The superficial great saphenous vein was also interrogated. Spectral Doppler was utilized to evaluate flow at rest and with distal augmentation maneuvers in the common femoral, femoral and popliteal veins. COMPARISON:  None. FINDINGS: RIGHT LOWER EXTREMITY Common Femoral Vein: No evidence of thrombus. Normal compressibility, respiratory phasicity and response to augmentation. Saphenofemoral Junction: No evidence of thrombus. Normal compressibility and flow on color Doppler imaging. Profunda Femoral Vein: No evidence of thrombus. Normal compressibility and flow on color Doppler imaging. Femoral Vein: No evidence of thrombus. Normal compressibility, respiratory phasicity and response to augmentation. Popliteal Vein: No evidence of thrombus. Normal compressibility, respiratory phasicity and response to augmentation. Calf Veins: Limited assessment.  No definite thrombus. Venous Reflux:  None. Other Findings:  None. LEFT LOWER EXTREMITY Common Femoral Vein: No evidence of thrombus. Normal compressibility, respiratory phasicity and response to augmentation. Saphenofemoral Junction: No evidence of thrombus. Normal compressibility and flow on color Doppler imaging. Profunda Femoral Vein: No evidence of thrombus. Normal compressibility and flow on color Doppler imaging. Femoral Vein: No evidence of thrombus. Normal compressibility, respiratory phasicity and response to augmentation. Popliteal Vein: No evidence of thrombus. Normal compressibility, respiratory phasicity  and response to augmentation. Calf Veins: Limited assessment.  No definite thrombus.  Venous Reflux:  None. Other Findings:  None. IMPRESSION: No evidence of deep venous thrombosis in either lower extremity to the level of the popliteal vein. Limited assessment of the calf veins but no definite thrombus. Electronically Signed   By: Audie Pinto M.D.   On: 04/01/2019 18:42   Dg Chest Portable 1 View  Result Date: 04/01/2019 CLINICAL DATA:  Leg swelling.  Evaluate for cardiomegaly. EXAM: PORTABLE CHEST 1 VIEW COMPARISON:  06/14/2017 FINDINGS: Normal heart size. Tortuous, unfolded aorta. No pleural effusion or edema. No airspace opacities identified. IMPRESSION: Normal heart size. 1. Tortuous unfolded aorta which may be the sequelae of chronic hypertension. Electronically Signed   By: Kerby Moors M.D.   On: 04/01/2019 17:10    Assessment and Plan:   1. Lower extremity edema Likely lymphedema given normal BMP, normal echocardiogram, IVC not dilated, no other signs of fluid retention, This morning with hypotension systolic pressure initially 90 now 84  after Lasix IV x2.  If pressure remains low may need IV fluids back -He has tried oral Lasix as an outpatient with no improvement of symptoms -He has been unable to put compression hose on as he typically does given debilities right arm, tremor left arm -He has been sitting and sleeping with his legs down more likely exacerbating his symptoms -No further cardiac work-up needed -Would recommend referral to vein and vascular for consideration of lymphedema compression pumps -For now could purchase Velcro compression hose.  He is unable to place compressions by himself  2.  Parkinson's disease Underlying tremor, limiting activities and ability to care for himself Would likely benefit from caretaker if he wishes to remain in independent living  3.  Hypotension Likely from overdiuresis Lasix in the emergency room and on the floor this morning Lasix has been held May need IV fluids back for hypotension     Total encounter time  more than 110 minutes  Greater than 50% was spent in counseling and coordination of care with the patient  For questions or updates, please contact [place] Please consult [email or web address] for contact info under     Signed, Ida Rogue, MD  04/02/2019 2:07 PM

## 2019-04-02 NOTE — ED Notes (Signed)
Report given to Rebecca RN.

## 2019-04-02 NOTE — Progress Notes (Signed)
*  PRELIMINARY RESULTS* Echocardiogram 2D Echocardiogram has been performed.  Lavell Luster Audon Heymann 04/02/2019, 9:23 AM

## 2019-04-02 NOTE — ED Provider Notes (Signed)
CT angios negative.  On discussion with the patient, he endorses severe, worsening bilateral leg pain.  His exam is consistent with likely significant pitting edema which could be due to diastolic dysfunction, along with superimposed cellulitis.  He states he lives alone and is having difficulty getting around his house due to this.  Given his age, difficulty ambulating, pronounced edema on exam despite negative BNP and DVT study, and likely superimposed cellulitis, will admit for further treatment.   Duffy Bruce, MD 04/02/19 [phone]

## 2019-04-02 NOTE — Progress Notes (Signed)
Patient ID: Adam Moss, male   DOB: 13-Aug-1933, 85 y.o.   MRN: [id number]  [facility] PROGRESS NOTE  Adam Moss [id number] DOB: 1934/07/06 DOA: 04/01/2019 PCP: Finis Bud, MD  HPI/Subjective: Patient states that he recently had a fracture of his right arm and is immobile with that.  He has Parkinson's affecting mostly his left arm and he is left-handed.  He has been unable to put on his compression stockings and his legs become more swollen.  He can hardly even get out of bed.  He lives at independent living at the [facility].  He has been started on Lasix without much help with the swelling of his lower extremities.  Objective: Vitals:   04/02/19 0858 04/02/19 1126  BP: 110/68 92/65  Pulse: 88 92  Resp:    Temp:  97.6 F (36.4 C)  SpO2: 98% 95%    Filed Weights   04/01/19 1552 04/02/19 0420  Weight: 86.2 kg 88.3 kg    ROS: Review of Systems  Constitutional: Negative for chills and fever.  Eyes: Negative for blurred vision.  Respiratory: Positive for shortness of breath. Negative for cough.   Cardiovascular: Negative for chest pain.  Gastrointestinal: Negative for abdominal pain, constipation, diarrhea, nausea and vomiting.  Genitourinary: Negative for dysuria.  Musculoskeletal: Positive for joint pain.  Neurological: Positive for tremors. Negative for dizziness and headaches.   Exam: Physical Exam  Constitutional: He is oriented to person, place, and time.  HENT:  Nose: No mucosal edema.  Mouth/Throat: No oropharyngeal exudate or posterior oropharyngeal edema.  Eyes: Pupils are equal, round, and reactive to light. Conjunctivae, EOM and lids are normal.  Neck: No JVD present. Carotid bruit is not present. No edema present. No thyroid mass and no thyromegaly present.  Cardiovascular: S1 normal and S2 normal. Exam reveals no gallop.  No murmur heard. Pulses:      Dorsalis pedis pulses are 2+ on the right side and 2+ on the left side.   Respiratory: No respiratory distress. He has no wheezes. He has no rhonchi. He has no rales.  GI: Soft. Bowel sounds are normal. There is no abdominal tenderness.  Musculoskeletal:     Right ankle: He exhibits swelling.     Left ankle: He exhibits swelling.  Lymphadenopathy:    He has no cervical adenopathy.  Neurological: He is alert and oriented to person, place, and time. No cranial nerve deficit.  Skin: Skin is warm. Nails show no clubbing.  Erythema bilateral lower extremities.  Patient states that this occurred in the last few days.  Psychiatric: He has a normal mood and affect.      Data Reviewed: Basic Metabolic Panel: Recent Labs  Lab 04/01/19 1333  NA 134*  K 4.0  CL 100  CO2 25  GLUCOSE 105*  BUN 28*  CREATININE 0.79  CALCIUM 8.9   Liver Function Tests: Recent Labs  Lab 04/01/19 1333  AST 19  ALT 7  ALKPHOS 65  BILITOT 1.0  PROT 6.5  ALBUMIN 3.7   CBC: Recent Labs  Lab 04/01/19 1333  WBC 5.6  HGB 12.5*  HCT 36.2*  MCV 87.0  PLT 162   BNP (last 3 results) Recent Labs    04/01/19 1625  BNP 29.0      Recent Results (from the past 240 hour(s))  SARS Coronavirus 2 [facility] order, Performed in [facility] hospital lab) Nasopharyngeal Nasopharyngeal Swab     Status: None   Collection Time: 04/01/19  6:58 PM  Specimen: Nasopharyngeal Swab  Result Value Ref Range Status   SARS Coronavirus 2 NEGATIVE NEGATIVE Final    Comment: (NOTE) If result is NEGATIVE SARS-CoV-2 target nucleic acids are NOT DETECTED. The SARS-CoV-2 RNA is generally detectable in upper and lower  respiratory specimens during the acute phase of infection. The lowest  concentration of SARS-CoV-2 viral copies this assay can detect is 250  copies / mL. A negative result does not preclude SARS-CoV-2 infection  and should not be used as the sole basis for treatment or other  patient management decisions.  A negative result may occur with  improper specimen collection /  handling, submission of specimen other  than nasopharyngeal swab, presence of viral mutation(s) within the  areas targeted by this assay, and inadequate number of viral copies  (<250 copies / mL). A negative result must be combined with clinical  observations, patient history, and epidemiological information. If result is POSITIVE SARS-CoV-2 target nucleic acids are DETECTED. The SARS-CoV-2 RNA is generally detectable in upper and lower  respiratory specimens dur ing the acute phase of infection.  Positive  results are indicative of active infection with SARS-CoV-2.  Clinical  correlation with patient history and other diagnostic information is  necessary to determine patient infection status.  Positive results do  not rule out bacterial infection or co-infection with other viruses. If result is PRESUMPTIVE POSTIVE SARS-CoV-2 nucleic acids MAY BE PRESENT.   A presumptive positive result was obtained on the submitted specimen  and confirmed on repeat testing.  While 2019 novel coronavirus  (SARS-CoV-2) nucleic acids may be present in the submitted sample  additional confirmatory testing may be necessary for epidemiological  and / or clinical management purposes  to differentiate between  SARS-CoV-2 and other Sarbecovirus currently known to infect humans.  If clinically indicated additional testing with an alternate test  methodology [phone]) is advised. The SARS-CoV-2 RNA is generally  detectable in upper and lower respiratory sp ecimens during the acute  phase of infection. The expected result is Negative. Fact Sheet for Patients:  StrictlyIdeas.no Fact Sheet for Healthcare Providers: BankingDealers.co.za This test is not yet approved or cleared by the [place] FDA and has been authorized for detection and/or diagnosis of SARS-CoV-2 by FDA under an Emergency Use Authorization (EUA).  This EUA will remain in effect (meaning this  test can be used) for the duration of the COVID-19 declaration under Section 564(b)(1) of the Act, 21 U.S.C. section 360bbb-3(b)(1), unless the authorization is terminated or revoked sooner. Performed at [facility], [address] [place], [place] [address]   Blood culture (routine x 2)     Status: None (Preliminary result)   Collection Time: 04/02/19  2:12 AM   Specimen: BLOOD  Result Value Ref Range Status   Specimen Description BLOOD BLOOD LEFT HAND  Final   Special Requests   Final    BOTTLES DRAWN AEROBIC AND ANAEROBIC Blood Culture results may not be optimal due to an excessive volume of blood received in culture bottles   Culture   Final    NO GROWTH < 12 HOURS Performed at [facility], [address] [place], [place] [address]    Report Status PENDING  Incomplete  Blood culture (routine x 2)     Status: None (Preliminary result)   Collection Time: 04/02/19  2:14 AM   Specimen: BLOOD  Result Value Ref Range Status   Specimen Description BLOOD LEFT ANTECUBITAL  Final   Special Requests   Final  BOTTLES DRAWN AEROBIC AND ANAEROBIC Blood Culture results may not be optimal due to an excessive volume of blood received in culture bottles   Culture   Final    NO GROWTH < 12 HOURS Performed at [facility], [address] [place], [place] [address]    Report Status PENDING  Incomplete     Studies: Ct Angio Chest Pe W And/or Wo Contrast  Result Date: 04/01/2019 CLINICAL DATA:  Bilateral leg swelling and discomfort EXAM: CT ANGIOGRAPHY CHEST WITH CONTRAST TECHNIQUE: Multidetector CT imaging of the chest was performed using the standard protocol during bolus administration of intravenous contrast. Multiplanar CT image reconstructions and MIPs were obtained to evaluate the vascular anatomy. CONTRAST:  65mL OMNIPAQUE IOHEXOL 350 MG/ML SOLN COMPARISON:  None. FINDINGS: Cardiovascular: There is a optimal opacification of the pulmonary arteries. There  is no central,segmental, or subsegmental filling defects within the pulmonary arteries. There is mild cardiomegaly. No pericardial effusion thickening. No evidence right heart strain. There is normal three-vessel brachiocephalic anatomy without proximal stenosis. There is a tortuous descending aorta. Mediastinum/Nodes: No hilar, mediastinal, or axillary adenopathy. Thyroid gland, trachea, and esophagus demonstrate no significant findings. Lungs/Pleura: There is minimal amount of bibasilar dependent atelectasis. No pleural effusion or pneumothorax. No airspace consolidation. Upper Abdomen: Surgical clips in the right upper quadrant. No acute abnormalities present in the visualized portions of the upper abdomen. Musculoskeletal: No chest wall abnormality. There is rounded lucent lesion seen within the T6 and T3 vertebral bodies, which are nonspecific. Review of the MIP images confirms the above findings. IMPRESSION: No central, segmental, or subsegmental pulmonary embolism. Mild cardiomegaly No acute intrathoracic pathology. Electronically Signed   By: Prudencio Pair M.D.   On: 04/01/2019 23:38   US Venous Img Lower Bilateral  Result Date: 04/01/2019 CLINICAL DATA:  Leg swelling, evaluate for DVT EXAM: BILATERAL LOWER EXTREMITY VENOUS DOPPLER ULTRASOUND TECHNIQUE: Gray-scale sonography with graded compression, as well as color Doppler and duplex ultrasound were performed to evaluate the lower extremity deep venous systems from the level of the common femoral vein and including the common femoral, femoral, profunda femoral, popliteal and calf veins including the posterior tibial, peroneal and gastrocnemius veins when visible. The superficial great saphenous vein was also interrogated. Spectral Doppler was utilized to evaluate flow at rest and with distal augmentation maneuvers in the common femoral, femoral and popliteal veins. COMPARISON:  None. FINDINGS: RIGHT LOWER EXTREMITY Common Femoral Vein: No evidence of  thrombus. Normal compressibility, respiratory phasicity and response to augmentation. Saphenofemoral Junction: No evidence of thrombus. Normal compressibility and flow on color Doppler imaging. Profunda Femoral Vein: No evidence of thrombus. Normal compressibility and flow on color Doppler imaging. Femoral Vein: No evidence of thrombus. Normal compressibility, respiratory phasicity and response to augmentation. Popliteal Vein: No evidence of thrombus. Normal compressibility, respiratory phasicity and response to augmentation. Calf Veins: Limited assessment.  No definite thrombus. Venous Reflux:  None. Other Findings:  None. LEFT LOWER EXTREMITY Common Femoral Vein: No evidence of thrombus. Normal compressibility, respiratory phasicity and response to augmentation. Saphenofemoral Junction: No evidence of thrombus. Normal compressibility and flow on color Doppler imaging. Profunda Femoral Vein: No evidence of thrombus. Normal compressibility and flow on color Doppler imaging. Femoral Vein: No evidence of thrombus. Normal compressibility, respiratory phasicity and response to augmentation. Popliteal Vein: No evidence of thrombus. Normal compressibility, respiratory phasicity and response to augmentation. Calf Veins: Limited assessment.  No definite thrombus. Venous Reflux:  None. Other Findings:  None. IMPRESSION: No evidence of deep venous thrombosis in  either lower extremity to the level of the popliteal vein. Limited assessment of the calf veins but no definite thrombus. Electronically Signed   By: Audie Pinto M.D.   On: 04/01/2019 18:42   Dg Chest Portable 1 View  Result Date: 04/01/2019 CLINICAL DATA:  Leg swelling.  Evaluate for cardiomegaly. EXAM: PORTABLE CHEST 1 VIEW COMPARISON:  06/14/2017 FINDINGS: Normal heart size. Tortuous, unfolded aorta. No pleural effusion or edema. No airspace opacities identified. IMPRESSION: Normal heart size. 1. Tortuous unfolded aorta which may be the sequelae of chronic  hypertension. Electronically Signed   By: Kerby Moors M.D.   On: 04/01/2019 17:10    Scheduled Meds: . aspirin EC  81 mg Oral Daily  . carbidopa-levodopa  1 tablet Oral BID  . docusate sodium  100 mg Oral BID  . enoxaparin (LOVENOX) injection  40 mg Subcutaneous Daily  . finasteride  5 mg Oral Daily  . fluticasone  1 spray Each Nare Daily  . fluticasone furoate-vilanterol  1 puff Inhalation Daily  . loratadine  10 mg Oral Daily  . niacin  500 mg Oral QHS  . pantoprazole  80 mg Oral Daily  . simvastatin  10 mg Oral q1800  . tamsulosin  0.4 mg Oral Daily  . vitamin C  500 mg Oral Daily   Continuous Infusions: . sodium chloride 10 mL/hr at 04/02/19 1307  .  ceFAZolin (ANCEF) IV 1 g (04/02/19 1308)    Assessment/Plan:  1. Bilateral lower extremity edema.  This could be lymphedema since he has been resistant to Lasix at home.  Try to get TED hose that can fit on his legs.  Cardiology wanted to hold Lasix today since his blood pressure dropped down.  Received a dose of Lasix in the emergency room and again this morning. May benefit from lymphedema clinic.  We will get physical therapy evaluation. 2. Chronic lower extremity venous stasis versus cellulitis.  On Ancef. 3. Recent fracture of the right humerus.  Follow-up with orthopedics as outpatient.  Right arm sling.  Orthopedic had referred him to oncology for a cyst on that area and his appointment is on Wednesday.  Can consider oncology evaluation if still here. 4. Parkinson's disease on Sinemet 5. BPH on Flomax 6. Hyperlipidemia unspecified on Zocor  Code Status:     Code Status Orders  (From admission, onward)         Start     Ordered   04/02/19 0402  Full code  Continuous     04/02/19 0401        Code Status History    This patient has a current code status but no historical code status.   Advance Care Planning Activity    Advance Directive Documentation     Most Recent Value  Type of Advance Directive   Healthcare Power of Attorney, Living will  Pre-existing out of facility DNR order (yellow form or pink MOST form)  -  "MOST" Form in Place?  -     Family Communication: spoke with [name] Disposition Plan: will need a higher level of care  Consultants:  cardiology  Antibiotics:  ancef  Time spent: 40 minutes  [place]

## 2019-04-02 NOTE — ED Notes (Signed)
ED TO INPATIENT HANDOFF REPORT  ED Nurse Name and Phone #: Wells Guiles [phone]  S Name/Age/Gender Adam Moss 85 y.o. male Room/Bed: ED26A/ED26A  Code Status   Code Status: Not on file  Home/SNF/Other Home Patient oriented to: self, place, time and situation Is this baseline? Yes   Triage Complete: Triage complete  Chief Complaint Leg Swelling  Triage Note PT sent over from [facility] for bilateral leg swelling, redness and elevated D-dimer.     Allergies Allergies  Allergen Reactions  . Carbidopa-Levodopa Other (See Comments)    Severe stomach pains, can take rytary  . Iodinated Diagnostic Agents     Pt is unsure of why this allergy is listed.  . Oxycodone-Acetaminophen Other (See Comments)    "boils on skin"  . Tylenol [Acetaminophen]   . Tyloxapol   . Pramipexole Nausea Only    Level of Care/Admitting Diagnosis ED Disposition    ED Disposition Condition [facility] Area: [place] [100120]  Level of Care: Med-Surg [16]  Covid Evaluation: Asymptomatic Screening Protocol (No Symptoms)  Diagnosis: Lower extremity edema [218969]  Admitting Physician: Harrie Foreman [id number]  Attending Physician: Harrie Foreman [id number]  Estimated length of stay: past midnight tomorrow  Certification:: I certify this patient will need inpatient services for at least 2 midnights  PT Class (Do Not Modify): Inpatient [101]  PT Acc Code (Do Not Modify): Private [1]       B Medical/Surgery History Past Medical History:  Diagnosis Date  . Cancer ([place])    skin  . Hyperlipemia   . Hypertension   . Parkinson disease ([place])   . Parkinsonism ([place]) 02/21/2015  . Spinal stenosis   . Tremor    Past Surgical History:  Procedure Laterality Date  . APPENDECTOMY    . CATARACT EXTRACTION    . CHOLECYSTECTOMY    . TONSILLECTOMY    . VASECTOMY       A IV Location/Drains/Wounds Patient Lines/Drains/Airways Status   Active  Line/Drains/Airways    Name:   Placement date:   Placement time:   Site:   Days:   Peripheral IV 04/02/19 Left Hand   04/02/19    0215    Hand   less than 1   Peripheral IV 04/01/19 Right Antecubital   04/01/19    1859    Antecubital   1          Intake/Output Last 24 hours  Intake/Output Summary (Last 24 hours) at 04/02/2019 0304 Last data filed at 04/02/2019 0128 Gross per 24 hour  Intake -  Output 350 ml  Net -350 ml    Labs/Imaging Results for orders placed or performed during the hospital encounter of 04/01/19 (from the past 48 hour(s))  Brain natriuretic peptide     Status: None   Collection Time: 04/01/19  4:25 PM  Result Value Ref Range   B Natriuretic Peptide 29.0 0.0 - 100.0 pg/mL    Comment: Performed at [facility], [address] [place], [place] [address]  SARS Coronavirus 2 [facility] order, Performed in [facility] hospital lab) Nasopharyngeal Nasopharyngeal Swab     Status: None   Collection Time: 04/01/19  6:58 PM   Specimen: Nasopharyngeal Swab  Result Value Ref Range   SARS Coronavirus 2 NEGATIVE NEGATIVE    Comment: (NOTE) If result is NEGATIVE SARS-CoV-2 target nucleic acids are NOT DETECTED. The SARS-CoV-2 RNA is generally detectable in upper and lower  respiratory specimens during the acute  phase of infection. The lowest  concentration of SARS-CoV-2 viral copies this assay can detect is 250  copies / mL. A negative result does not preclude SARS-CoV-2 infection  and should not be used as the sole basis for treatment or other  patient management decisions.  A negative result may occur with  improper specimen collection / handling, submission of specimen other  than nasopharyngeal swab, presence of viral mutation(s) within the  areas targeted by this assay, and inadequate number of viral copies  (<250 copies / mL). A negative result must be combined with clinical  observations, patient history, and epidemiological information. If result is  POSITIVE SARS-CoV-2 target nucleic acids are DETECTED. The SARS-CoV-2 RNA is generally detectable in upper and lower  respiratory specimens dur ing the acute phase of infection.  Positive  results are indicative of active infection with SARS-CoV-2.  Clinical  correlation with patient history and other diagnostic information is  necessary to determine patient infection status.  Positive results do  not rule out bacterial infection or co-infection with other viruses. If result is PRESUMPTIVE POSTIVE SARS-CoV-2 nucleic acids MAY BE PRESENT.   A presumptive positive result was obtained on the submitted specimen  and confirmed on repeat testing.  While 2019 novel coronavirus  (SARS-CoV-2) nucleic acids may be present in the submitted sample  additional confirmatory testing may be necessary for epidemiological  and / or clinical management purposes  to differentiate between  SARS-CoV-2 and other Sarbecovirus currently known to infect humans.  If clinically indicated additional testing with an alternate test  methodology [phone]) is advised. The SARS-CoV-2 RNA is generally  detectable in upper and lower respiratory sp ecimens during the acute  phase of infection. The expected result is Negative. Fact Sheet for Patients:  StrictlyIdeas.no Fact Sheet for Healthcare Providers: BankingDealers.co.za This test is not yet approved or cleared by the [place] FDA and has been authorized for detection and/or diagnosis of SARS-CoV-2 by FDA under an Emergency Use Authorization (EUA).  This EUA will remain in effect (meaning this test can be used) for the duration of the COVID-19 declaration under Section 564(b)(1) of the Act, 21 U.S.C. section 360bbb-3(b)(1), unless the authorization is terminated or revoked sooner. Performed at [facility], [place], [place] [address]    Ct Angio Chest Pe W And/or Wo Contrast  Result  Date: 04/01/2019 CLINICAL DATA:  Bilateral leg swelling and discomfort EXAM: CT ANGIOGRAPHY CHEST WITH CONTRAST TECHNIQUE: Multidetector CT imaging of the chest was performed using the standard protocol during bolus administration of intravenous contrast. Multiplanar CT image reconstructions and MIPs were obtained to evaluate the vascular anatomy. CONTRAST:  47mL OMNIPAQUE IOHEXOL 350 MG/ML SOLN COMPARISON:  None. FINDINGS: Cardiovascular: There is a optimal opacification of the pulmonary arteries. There is no central,segmental, or subsegmental filling defects within the pulmonary arteries. There is mild cardiomegaly. No pericardial effusion thickening. No evidence right heart strain. There is normal three-vessel brachiocephalic anatomy without proximal stenosis. There is a tortuous descending aorta. Mediastinum/Nodes: No hilar, mediastinal, or axillary adenopathy. Thyroid gland, trachea, and esophagus demonstrate no significant findings. Lungs/Pleura: There is minimal amount of bibasilar dependent atelectasis. No pleural effusion or pneumothorax. No airspace consolidation. Upper Abdomen: Surgical clips in the right upper quadrant. No acute abnormalities present in the visualized portions of the upper abdomen. Musculoskeletal: No chest wall abnormality. There is rounded lucent lesion seen within the T6 and T3 vertebral bodies, which are nonspecific. Review of the MIP images confirms the above findings. IMPRESSION:  No central, segmental, or subsegmental pulmonary embolism. Mild cardiomegaly No acute intrathoracic pathology. Electronically Signed   By: Prudencio Pair M.D.   On: 04/01/2019 23:38   US Venous Img Lower Bilateral  Result Date: 04/01/2019 CLINICAL DATA:  Leg swelling, evaluate for DVT EXAM: BILATERAL LOWER EXTREMITY VENOUS DOPPLER ULTRASOUND TECHNIQUE: Gray-scale sonography with graded compression, as well as color Doppler and duplex ultrasound were performed to evaluate the lower extremity deep venous  systems from the level of the common femoral vein and including the common femoral, femoral, profunda femoral, popliteal and calf veins including the posterior tibial, peroneal and gastrocnemius veins when visible. The superficial great saphenous vein was also interrogated. Spectral Doppler was utilized to evaluate flow at rest and with distal augmentation maneuvers in the common femoral, femoral and popliteal veins. COMPARISON:  None. FINDINGS: RIGHT LOWER EXTREMITY Common Femoral Vein: No evidence of thrombus. Normal compressibility, respiratory phasicity and response to augmentation. Saphenofemoral Junction: No evidence of thrombus. Normal compressibility and flow on color Doppler imaging. Profunda Femoral Vein: No evidence of thrombus. Normal compressibility and flow on color Doppler imaging. Femoral Vein: No evidence of thrombus. Normal compressibility, respiratory phasicity and response to augmentation. Popliteal Vein: No evidence of thrombus. Normal compressibility, respiratory phasicity and response to augmentation. Calf Veins: Limited assessment.  No definite thrombus. Venous Reflux:  None. Other Findings:  None. LEFT LOWER EXTREMITY Common Femoral Vein: No evidence of thrombus. Normal compressibility, respiratory phasicity and response to augmentation. Saphenofemoral Junction: No evidence of thrombus. Normal compressibility and flow on color Doppler imaging. Profunda Femoral Vein: No evidence of thrombus. Normal compressibility and flow on color Doppler imaging. Femoral Vein: No evidence of thrombus. Normal compressibility, respiratory phasicity and response to augmentation. Popliteal Vein: No evidence of thrombus. Normal compressibility, respiratory phasicity and response to augmentation. Calf Veins: Limited assessment.  No definite thrombus. Venous Reflux:  None. Other Findings:  None. IMPRESSION: No evidence of deep venous thrombosis in either lower extremity to the level of the popliteal vein. Limited  assessment of the calf veins but no definite thrombus. Electronically Signed   By: Audie Pinto M.D.   On: 04/01/2019 18:42   Dg Chest Portable 1 View  Result Date: 04/01/2019 CLINICAL DATA:  Leg swelling.  Evaluate for cardiomegaly. EXAM: PORTABLE CHEST 1 VIEW COMPARISON:  06/14/2017 FINDINGS: Normal heart size. Tortuous, unfolded aorta. No pleural effusion or edema. No airspace opacities identified. IMPRESSION: Normal heart size. 1. Tortuous unfolded aorta which may be the sequelae of chronic hypertension. Electronically Signed   By: Kerby Moors M.D.   On: 04/01/2019 17:10    Pending Labs Unresulted Labs (From admission, onward)    Start     Ordered   04/02/19 0116  Blood culture (routine x 2)  BLOOD CULTURE X 2,   STAT     04/02/19 0115   Signed and Held  Creatinine, serum  (enoxaparin (LOVENOX)    CrCl >/= 30 ml/min)  Weekly,   R    Comments: while on enoxaparin therapy    Signed and Held   Signed and Held  TSH  Add-on,   R     Signed and Held          Vitals/Pain Today's Vitals   04/01/19 2354 04/02/19 0000 04/02/19 0009 04/02/19 0100  BP: 138/81 (!) 146/91  (!) 141/83  Pulse: 68 65  65  Resp: 18 18  19   Temp:      TempSrc:      SpO2: 97% 97%  96%  Weight:      Height:      PainSc: 0-No pain  0-No pain     Isolation Precautions No active isolations  Medications Medications  carbidopa-levodopa (SINEMET CR) 50-200 MG per tablet controlled release 1 tablet (1 tablet Oral Given 04/01/19 2229)  hydrocortisone sodium succinate (SOLU-CORTEF) injection 200 mg (200 mg Intravenous Given 04/01/19 1908)  diphenhydrAMINE (BENADRYL) capsule 50 mg (50 mg Oral Given 04/01/19 2206)    Or  diphenhydrAMINE (BENADRYL) injection 50 mg ( Intravenous See Alternative 04/01/19 2206)  iohexol (OMNIPAQUE) 350 MG/ML injection 75 mL (75 mLs Intravenous Contrast Given 04/01/19 2312)  furosemide (LASIX) injection 40 mg (40 mg Intravenous Given 04/02/19 0209)  ceFAZolin (ANCEF) IVPB 2g/100 mL premix  (0 g Intravenous Stopped 04/02/19 0304)    Mobility walks with device Low fall risk   Focused Assessments Edema   R Recommendations: See Admitting Provider Note  Report given to:   Additional Notes:

## 2019-04-03 LAB — BASIC METABOLIC PANEL
Anion gap: 8 (ref 5–15)
BUN: 25 mg/dL — ABNORMAL HIGH (ref 8–23)
CO2: 28 mmol/L (ref 22–32)
Calcium: 8.2 mg/dL — ABNORMAL LOW (ref 8.9–10.3)
Chloride: 101 mmol/L (ref 98–111)
Creatinine, Ser: 1 mg/dL (ref 0.61–1.24)
GFR calc Af Amer: >60 mL/min (ref >60)
GFR calc non Af Amer: >60 mL/min (ref >60)
Glucose, Bld: 105 mg/dL — ABNORMAL HIGH (ref 70–99)
Potassium: 3.2 mmol/L — ABNORMAL LOW (ref 3.5–5.1)
Sodium: 137 mmol/L (ref 135–145)

## 2019-04-03 LAB — CBC
HCT: 32.2 % — ABNORMAL LOW (ref 39.0–52.0)
Hemoglobin: 10.7 g/dL — ABNORMAL LOW (ref 13.0–17.0)
MCH: 29.4 pg (ref 26.0–34.0)
MCHC: 33.2 g/dL (ref 30.0–36.0)
MCV: 88.5 fL (ref 80.0–100.0)
Platelets: 138 10*3/uL — ABNORMAL LOW (ref 150–400)
RBC: 3.64 MIL/uL — ABNORMAL LOW (ref 4.22–5.81)
RDW: 15.7 % — ABNORMAL HIGH (ref 11.5–15.5)
WBC: 4.7 10*3/uL (ref 4.0–10.5)
nRBC: 0 % (ref 0.0–0.2)

## 2019-04-03 MED ORDER — POTASSIUM CHLORIDE CRYS ER 20 MEQ PO TBCR
40.0000 meq | EXTENDED_RELEASE_TABLET | Freq: Once | ORAL | Status: AC
  Administered 2019-04-03: 40 meq via ORAL
  Filled 2019-04-03: qty 2

## 2019-04-03 MED ORDER — CEPHALEXIN 500 MG PO CAPS: 500.0000 mg | ORAL_CAPSULE | Freq: Three times a day (TID) | ORAL | 0 refills | Status: AC

## 2019-04-03 NOTE — Plan of Care (Signed)
  Problem: Education: Goal: Knowledge of General Education information will improve Description: Including pain rating scale, medication(s)/side effects and non-pharmacologic comfort measures Outcome: Adequate for Discharge   Problem: Health Behavior/Discharge Planning: Goal: Ability to manage health-related needs will improve Outcome: Adequate for Discharge   Problem: Clinical Measurements: Goal: Ability to maintain clinical measurements within normal limits will improve Outcome: Adequate for Discharge Goal: Will remain free from infection Outcome: Adequate for Discharge Goal: Diagnostic test results will improve Outcome: Adequate for Discharge Goal: Respiratory complications will improve Outcome: Adequate for Discharge Goal: Cardiovascular complication will be avoided Outcome: Adequate for Discharge   Problem: Activity: Goal: Risk for activity intolerance will decrease Outcome: Adequate for Discharge   Problem: Nutrition: Goal: Adequate nutrition will be maintained Outcome: Adequate for Discharge   Problem: Coping: Goal: Level of anxiety will decrease Outcome: Adequate for Discharge   Problem: Elimination: Goal: Will not experience complications related to bowel motility Outcome: Adequate for Discharge Goal: Will not experience complications related to urinary retention Outcome: Adequate for Discharge   Problem: Pain Managment: Goal: General experience of comfort will improve Outcome: Adequate for Discharge   Problem: Safety: Goal: Ability to remain free from injury will improve Outcome: Adequate for Discharge   Problem: Skin Integrity: Goal: Risk for impaired skin integrity will decrease Outcome: Adequate for Discharge   Problem: Acute Rehab PT Goals(only PT should resolve) Goal: Pt Will Go Supine/Side To Sit Outcome: Adequate for Discharge Goal: Pt Will Go Sit To Supine/Side Outcome: Adequate for Discharge Goal: Pt Will Perform Standing Balance Or  Pre-Gait Outcome: Adequate for Discharge Goal: Pt Will Ambulate Outcome: Adequate for Discharge

## 2019-04-03 NOTE — Evaluation (Signed)
Physical Therapy Evaluation Patient Details Name: Adam Moss MRN: [id number] DOB: 05/13/1934 Today's Date: 04/03/2019   History of Present Illness   85 y/o male with past medical history of Parkinson disease, hypertension hyperlipidemia presents to the emergency department complaining of swelling of his legs (L>R).  He recently suffered R clavicular fracture while pushing up from couch surface, f/u soon re: this with oncology.  Has been less able to manage in the home since clav fx and cannot don compression hoes, etc  Clinical Impression  Pt eager to work with PT and generally did well though he is not at his baseline, and a lot of his medical issues seem secondary to positioning and self care issues related to recent R clavicular fracture.  He was able to get up to EOB w/o assist but needed rail and some extra time.  He managed ~10 minutes of gait and mobility training apart from exam with no overt safety issues or excessive fatigue.  Overall he is functionally able to return home safely, however it appears that recent difficulty with self care, etc with necessitate further work with PT and OT in the home setting to gain as much function as possible.    Follow Up Recommendations Home health PT    Equipment Recommendations  None recommended by PT    Recommendations for Other Services OT consult     Precautions / Restrictions Precautions Precautions: Fall Restrictions Weight Bearing Restrictions: No      Mobility  Bed Mobility Overal bed mobility: Independent             General bed mobility comments: Pt able to get out of bed with more ease than getting back in, but able to do so w/o direct assist.   Transfers Overall transfer level: Independent               General transfer comment: Pt was able to rise with only L UE to push from surface, kept bed at low setting and he struggled, raised bed 1-2" but managed w/o assist  Ambulation/Gait Ambulation/Gait  assistance: Supervision Gait Distance (Feet): 350 Feet Assistive device: None       General Gait Details: Pt able to walk the entire loop x2 without AD.  He reports he is walking slower and a little more cautiously than his baseline but was safe and did not have any LOBs t/o the effort.  Pt's O2 remains in the high 90s, HR did increase >100  Stairs            Wheelchair Mobility    Modified Rankin (Stroke Patients Only)       Balance Overall balance assessment: Modified Independent                                           Pertinent Vitals/Pain Pain Assessment: (reports R clavicular pain is not bad if he doesn't move it)    Home Living Family/patient expects to be discharged to:: (independent living apartment (TVAB))                      Prior Function Level of Independence: Independent         Comments: Pt reports that he sometimes uses walking stick/cane, but generally can manage w/o AD.       Hand Dominance        Extremity/Trunk Assessment  Upper Extremity Assessment Upper Extremity Assessment: Overall WFL for tasks assessed    Lower Extremity Assessment Lower Extremity Assessment: Overall WFL for tasks assessed       Communication   Communication: No difficulties  Cognition Arousal/Alertness: Awake/alert Behavior During Therapy: WFL for tasks assessed/performed Overall Cognitive Status: Within Functional Limits for tasks assessed                                        General Comments      Exercises     Assessment/Plan    PT Assessment Patient needs continued PT services  PT Problem List Decreased strength;Decreased activity tolerance;Decreased balance;Decreased range of motion;Decreased mobility;Decreased coordination;Decreased knowledge of use of DME;Decreased safety awareness       PT Treatment Interventions Gait training;Functional mobility training;Therapeutic activities;Therapeutic  exercise;Balance training;Neuromuscular re-education;Cognitive remediation;Patient/family education    PT Goals (Current goals can be found in the Care Plan section)  Acute Rehab PT Goals Patient Stated Goal: go back to apartment PT Goal Formulation: With patient Time For Goal Achievement: 04/17/19 Potential to Achieve Goals: Good    Frequency Min 2X/week   Barriers to discharge        Co-evaluation               AM-PAC PT "6 Clicks" Mobility  Outcome Measure Help needed turning from your back to your side while in a flat bed without using bedrails?: A Little Help needed moving from lying on your back to sitting on the side of a flat bed without using bedrails?: A Little Help needed moving to and from a bed to a chair (including a wheelchair)?: A Little Help needed standing up from a chair using your arms (e.g., wheelchair or bedside chair)?: A Little Help needed to walk in hospital room?: A Little Help needed climbing 3-5 steps with a railing? : A Little 6 Click Score: 18    End of Session Equipment Utilized During Treatment: Gait belt Activity Tolerance: Patient tolerated treatment well;Patient limited by fatigue Patient left: with bed alarm set;with call bell/phone within reach Nurse Communication: Mobility status PT Visit Diagnosis: Muscle weakness (generalized) (M62.81);Difficulty in walking, not elsewhere classified (R26.2)    Time: [id number] PT Time Calculation (min) (ACUTE ONLY): 28 min   Charges:   PT Evaluation $PT Eval Low Complexity: 1 Low PT Treatments $Gait Training: 8-22 mins        Kreg Shropshire, DPT 04/03/2019, 11:06 AM

## 2019-04-03 NOTE — TOC Initial Note (Signed)
Transition of Care [facility]) - Initial/Assessment Note    Patient Details  Name: Yaron Rinderknecht MRN: [id number] Date of Birth: 1934/05/20  [facility]) CM/SW Contact:    Beverly Sessions, RN Phone Number: 04/03/2019, 1:11 PM  Clinical Narrative:                 Patient admitted for lower extremity edema  [facility] [place] [facility] Patient concerned about the "2 week quarantine once he returns"  RNCM clarified restrictions with Maudie Mercury at [facility]. Patient can still have home health,  Family can still visit, and transport to appointments.  Patient can not go to common area, and the dining hall.  This information has been relayed to patient and daughter in law [name].   MD to order home health PT, RN, aide, and OT at discharge.  Patient did not have preference of agency.  Kim at [facility] they have all skilled services on site, and they will arrange services after discharge.  Orders to be faxed to [phone]  Patient states that he received a letter a couple of weeks ago from the [facility] discussing PCS services and would like more information.  Kim at [employer], she has reached out to [facility], and they will contact the patient and family.   [facility] [address] [facility] and [facility]. Denies issues obtaining medications.  Son and daughter in law provide transportation     Expected Discharge Plan: [place]     Patient Goals and CMS Choice        Expected Discharge Plan and Services Expected Discharge Plan: [place]   Discharge Planning Services: CM Consult                               HH Arranged: RN, PT, OT, Nurse's Aide          Prior Living Arrangements/Services   Lives with:: Self Patient language and need for interpreter reviewed:: Yes Do you feel safe going back to the place where you live?: Yes      Need for Family Participation in Patient Care: Yes  (Comment) Care giver support system in place?: Yes (comment)   Criminal Activity/Legal Involvement Pertinent to Current Situation/Hospitalization: No - Comment as needed  Activities of Daily Living Home Assistive Devices/Equipment: Cane (specify quad or straight) ADL Screening (condition at time of admission) Patient's cognitive ability adequate to safely complete daily activities?: Yes Is the patient deaf or have difficulty hearing?: No Does the patient have difficulty seeing, even when wearing glasses/contacts?: No Does the patient have difficulty concentrating, remembering, or making decisions?: No Patient able to express need for assistance with ADLs?: Yes Does the patient have difficulty dressing or bathing?: Yes Independently performs ADLs?: No Communication: Independent Dressing (OT): Needs assistance Is this a change from baseline?: Pre-admission baseline Grooming: Needs assistance Is this a change from baseline?: Pre-admission baseline Feeding: Dependent Is this a change from baseline?: Pre-admission baseline Bathing: Needs assistance Is this a change from baseline?: Pre-admission baseline Toileting: Needs assistance Is this a change from baseline?: Pre-admission baseline In/Out Bed: Needs assistance Is this a change from baseline?: Pre-admission baseline Walks in Home: Needs assistance Is this a change from baseline?: Pre-admission baseline Does the patient have difficulty walking or climbing stairs?: Yes Weakness of Legs: Both Weakness of Arms/Hands: Both  Permission Sought/Granted  Emotional Assessment Appearance:: Appears stated age Attitude/Demeanor/Rapport: Gracious Affect (typically observed): Accepting Orientation: : Oriented to Self, Oriented to Place, Oriented to  Time, Oriented to Situation   Psych Involvement: No (comment)  Admission diagnosis:  Peripheral edema [R60.9] Leg swelling [M79.89] Patient Active Problem List    Diagnosis Date Noted  . Lower extremity edema 04/02/2019  . Pathologic fracture 03/30/2019  . Cough 12/04/2015  . Non-seasonal allergic rhinitis 12/04/2015  . Parkinsonism ([place]) 02/21/2015  . Spinal stenosis in cervical region 11/30/2013  . Tremor 11/30/2013  . Patient risk and functional assessment 04/14/2012  . History of actinic keratoses 10/02/2010  . History of nonmelanoma skin cancer 10/02/2010  . Chest pain, unspecified 03/28/2007   PCP:  Finis Bud, MD Pharmacy[facility]   [id number] [address] [place] [address] Phone: [phone] Fax: [phone]  [facility] [place] [address] [facility] [address] [employer] [place] [address] Phone: [phone] Fax: [phone]     Social Determinants of Health (SDOH) Interventions    Readmission Risk Interventions No flowsheet data found.

## 2019-04-03 NOTE — Progress Notes (Signed)
OT Cancellation Note  Patient Details Name: Adam Moss MRN: [id number] DOB: 05-27-1934   Cancelled Treatment:    Reason Eval/Treat Not Completed: Other (comment). Consult received, chart reviewed. Spoke with pt. Pt under the impression he is discharging today. Politely declines OT evaluation at this time and already has orders for home health OT in place for after discharge. Pt expressed concern about having to "quarantine" once he returns to his home. Active listening utilized and emotional support provided. Will re-attempt OT evaluation next date should pt not discharge today.   Jeni Salles, MPH, MS, OTR/L ascom [phone] 04/03/19, 1:38 PM

## 2019-04-03 NOTE — Discharge Summary (Signed)
[facility] [place] NAME: Adam Moss    MR#:  [id number]  DATE OF BIRTH:  June 21, 1934  DATE OF ADMISSION:  04/01/2019 ADMITTING PHYSICIAN: Harrie Foreman, MD  DATE OF DISCHARGE: 04/03/2019  PRIMARY CARE PHYSICIAN: Finis Bud, MD   ADMISSION DIAGNOSIS:  Peripheral edema [R60.9] Leg swelling [M79.89]  DISCHARGE DIAGNOSIS:  Active Problems:   Lower extremity edema Chronic lymphedema Lower extremity venous stasis with cellulitis Recent fracture of right humerus Parkinson's disease Benign prostate hypertrophy Hyperlipidemia Acute hypokalemia SECONDARY DIAGNOSIS:   Past Medical History:  Diagnosis Date  . Cancer ([place])    skin  . Hyperlipemia   . Hypertension   . Parkinson disease ([place])   . Parkinsonism ([place]) 02/21/2015  . Spinal stenosis   . Tremor      ADMITTING HISTORY The patient with past medical history of Parkinson disease, hypertension hyperlipidemia presents to the emergency department complaining of swelling of his left leg.  The patient was seen at urgent care earlier today who recommended evaluation in the emergency department due to elevated d-dimer.  The patient reports that he has been unable to ambulate well due to difficulty bending his lower extremity.  The patient was started on Lasix for lower extremity edema a number of weeks ago.  His swelling has worsened.  He denies pain in his leg except for when he tries to bend the knee at an acute angle.  The patient was given Lasix in the emergency department prior to the emergency department staff called the hospitalist service for admission.  HOSPITAL COURSE:  Patient was admitted to medical floor and seen by cardiology she was worked up with echocardiogram and diuresed with Lasix patient was diuresed with Lasix as outpatient to but did not respond much.  Echocardiogram was normal.  Because of low blood pressures did diuresis with Lasix has been stopped  completely by cardiology and did not recommend any more diuresis.  Patient received a sling for right humerus fracture follow-up with orthopedics as outpatient.  She also received antibiotics that is Keflex for cellulitis of the lower extremities.No history of any heart failure.  BNP is normal.  Cardiogram showed EF of 60 to 65% with normal systolic function.  Dilatation of the aortic root 3.5 cm and of ascending aorta 3.7 cm.  Vascular surgery follow-up as outpatient for lymphedema compression pumps.  Patient was worked up with CTA chest which showed no perm embolism, venous ultrasound of lower extremity showed no DVT. For now Velcro compression hose has been ordered.  Patient will be discharged home with home health services. CONSULTS OBTAINED:  Cardiology  DRUG ALLERGIES:   Allergies  Allergen Reactions  . Carbidopa-Levodopa Other (See Comments)    Severe stomach pains, can take rytary  . Iodinated Diagnostic Agents     Pt is unsure of why this allergy is listed.  . Oxycodone-Acetaminophen Other (See Comments)    "boils on skin"  . Tylenol [Acetaminophen]   . Tyloxapol   . Pramipexole Nausea Only    DISCHARGE MEDICATIONS:   Allergies as of 04/03/2019      Reactions   Carbidopa-levodopa Other (See Comments)   Severe stomach pains, can take rytary   Iodinated Diagnostic Agents    Pt is unsure of why this allergy is listed.   Oxycodone-acetaminophen Other (See Comments)   "boils on skin"   Tylenol [acetaminophen]    Tyloxapol    Pramipexole Nausea Only      Medication  List    STOP taking these medications   calcium carbonate 500 MG chewable tablet Commonly known as: TUMS - dosed in mg elemental calcium   furosemide 20 MG tablet Commonly known as: LASIX     TAKE these medications   aspirin 81 MG tablet Take 81 mg by mouth daily.   Breo Ellipta 100-25 MCG/INH Aepb Generic drug: fluticasone furoate-vilanterol USE 1 INHALATION ORALLY    DAILY   Calcium Citrate 250 MG  Tabs Take by mouth.   Carbidopa-Levodopa ER 48.75-195 MG Cpcr Commonly known as: Rytary Take 195 mg by mouth 5 (five) times daily.   cephALEXin 500 MG capsule Commonly known as: KEFLEX Take 1 capsule (500 mg total) by mouth 3 (three) times daily for 4 days.   cetirizine 10 MG tablet Commonly known as: ZYRTEC Take 10 mg by mouth daily.   finasteride 5 MG tablet Commonly known as: PROSCAR Take 5 mg by mouth daily.   fluticasone 50 MCG/ACT nasal spray Commonly known as: FLONASE Place into both nostrils daily.   GAS RELIEF PO Take by mouth.   ipratropium 0.03 % nasal spray Commonly known as: ATROVENT Place 0.03 mLs into both nostrils 3 (three) times daily as needed.   niacin 500 MG tablet Take 500 mg by mouth every morning.   olmesartan 20 MG tablet Commonly known as: BENICAR Take 20 mg by mouth daily.   omeprazole 40 MG capsule Commonly known as: PRILOSEC Take 40 mg by mouth daily.   ProAir HFA 108 (90 Base) MCG/ACT inhaler Generic drug: albuterol INHALE 2 PUFFS INTO THE LUNGS AS NEEDED.   simvastatin 10 MG tablet Commonly known as: ZOCOR Take 10 mg by mouth daily.   tamsulosin 0.4 MG Caps capsule Commonly known as: FLOMAX Take 0.4 mg by mouth daily.   vitamin C 500 MG tablet Commonly known as: ASCORBIC ACID Take 500 mg by mouth daily.       Today  Patient seen today Swelling in the lower extremities better No chest pain No shortness of breath Sling to right arm VITAL SIGNS:  Blood pressure 108/80, pulse 63, temperature 97.7 F (36.5 C), temperature source Oral, resp. rate 18, height 5\' 10"  (1.778 m), weight 90.2 kg, SpO2 100 %.  I/O:    Intake/Output Summary (Last 24 hours) at 04/03/2019 1332 Last data filed at 04/03/2019 1229 Gross per 24 hour  Intake 624.31 ml  Output 750 ml  Net -125.69 ml    PHYSICAL EXAMINATION:  Physical Exam  GENERAL:  85 y.o.-year-old patient lying in the bed with no acute distress.  LUNGS: Normal breath sounds  bilaterally, no wheezing, rales,rhonchi or crepitation. No use of accessory muscles of respiration.  CARDIOVASCULAR: S1, S2 normal. No murmurs, rubs, or gallops.  ABDOMEN: Soft, non-tender, non-distended. Bowel sounds present. No organomegaly or mass.  NEUROLOGIC: Moves all 4 extremities. PSYCHIATRIC: The patient is alert and oriented x 3.  SKIN: No obvious rash, lesion, or ulcer.   DATA REVIEW:   CBC Recent Labs  Lab 04/03/19 0412  WBC 4.7  HGB 10.7*  HCT 32.2*  PLT 138*    Chemistries  Recent Labs  Lab 04/01/19 1333 04/03/19 0412  NA 134* 137  K 4.0 3.2*  CL 100 101  CO2 25 28  GLUCOSE 105* 105*  BUN 28* 25*  CREATININE 0.79 1.00  CALCIUM 8.9 8.2*  AST 19  --   ALT 7  --   ALKPHOS 65  --   BILITOT 1.0  --  Cardiac Enzymes No results for input(s): TROPONINI in the last 168 hours.  Microbiology Results  Results for orders placed or performed during the hospital encounter of 04/01/19  SARS Coronavirus 2 [facility] order, Performed in [facility] hospital lab) Nasopharyngeal Nasopharyngeal Swab     Status: None   Collection Time: 04/01/19  6:58 PM   Specimen: Nasopharyngeal Swab  Result Value Ref Range Status   SARS Coronavirus 2 NEGATIVE NEGATIVE Final    Comment: (NOTE) If result is NEGATIVE SARS-CoV-2 target nucleic acids are NOT DETECTED. The SARS-CoV-2 RNA is generally detectable in upper and lower  respiratory specimens during the acute phase of infection. The lowest  concentration of SARS-CoV-2 viral copies this assay can detect is 250  copies / mL. A negative result does not preclude SARS-CoV-2 infection  and should not be used as the sole basis for treatment or other  patient management decisions.  A negative result may occur with  improper specimen collection / handling, submission of specimen other  than nasopharyngeal swab, presence of viral mutation(s) within the  areas targeted by this assay, and inadequate number of viral copies  (<250 copies  / mL). A negative result must be combined with clinical  observations, patient history, and epidemiological information. If result is POSITIVE SARS-CoV-2 target nucleic acids are DETECTED. The SARS-CoV-2 RNA is generally detectable in upper and lower  respiratory specimens dur ing the acute phase of infection.  Positive  results are indicative of active infection with SARS-CoV-2.  Clinical  correlation with patient history and other diagnostic information is  necessary to determine patient infection status.  Positive results do  not rule out bacterial infection or co-infection with other viruses. If result is PRESUMPTIVE POSTIVE SARS-CoV-2 nucleic acids MAY BE PRESENT.   A presumptive positive result was obtained on the submitted specimen  and confirmed on repeat testing.  While 2019 novel coronavirus  (SARS-CoV-2) nucleic acids may be present in the submitted sample  additional confirmatory testing may be necessary for epidemiological  and / or clinical management purposes  to differentiate between  SARS-CoV-2 and other Sarbecovirus currently known to infect humans.  If clinically indicated additional testing with an alternate test  methodology [phone]) is advised. The SARS-CoV-2 RNA is generally  detectable in upper and lower respiratory sp ecimens during the acute  phase of infection. The expected result is Negative. Fact Sheet for Patients:  StrictlyIdeas.no Fact Sheet for Healthcare Providers: BankingDealers.co.za This test is not yet approved or cleared by the [place] FDA and has been authorized for detection and/or diagnosis of SARS-CoV-2 by FDA under an Emergency Use Authorization (EUA).  This EUA will remain in effect (meaning this test can be used) for the duration of the COVID-19 declaration under Section 564(b)(1) of the Act, 21 U.S.C. section 360bbb-3(b)(1), unless the authorization is terminated or revoked  sooner. Performed at [facility], [address] [place], [place] [address]   Blood culture (routine x 2)     Status: None (Preliminary result)   Collection Time: 04/02/19  2:12 AM   Specimen: BLOOD  Result Value Ref Range Status   Specimen Description BLOOD BLOOD LEFT HAND  Final   Special Requests   Final    BOTTLES DRAWN AEROBIC AND ANAEROBIC Blood Culture results may not be optimal due to an excessive volume of blood received in culture bottles   Culture   Final    NO GROWTH 1 DAY Performed at [facility], [address] [place], [place]  [address]    Report Status PENDING  Incomplete  Blood culture (routine x 2)     Status: None (Preliminary result)   Collection Time: 04/02/19  2:14 AM   Specimen: BLOOD  Result Value Ref Range Status   Specimen Description BLOOD LEFT ANTECUBITAL  Final   Special Requests   Final    BOTTLES DRAWN AEROBIC AND ANAEROBIC Blood Culture results may not be optimal due to an excessive volume of blood received in culture bottles   Culture   Final    NO GROWTH 1 DAY Performed at [facility], [address] [place], [place] [address]    Report Status PENDING  Incomplete    RADIOLOGY:  Ct Angio Chest Pe W And/or Wo Contrast  Result Date: 04/01/2019 CLINICAL DATA:  Bilateral leg swelling and discomfort EXAM: CT ANGIOGRAPHY CHEST WITH CONTRAST TECHNIQUE: Multidetector CT imaging of the chest was performed using the standard protocol during bolus administration of intravenous contrast. Multiplanar CT image reconstructions and MIPs were obtained to evaluate the vascular anatomy. CONTRAST:  45mL OMNIPAQUE IOHEXOL 350 MG/ML SOLN COMPARISON:  None. FINDINGS: Cardiovascular: There is a optimal opacification of the pulmonary arteries. There is no central,segmental, or subsegmental filling defects within the pulmonary arteries. There is mild cardiomegaly. No pericardial effusion thickening. No evidence right heart strain. There is  normal three-vessel brachiocephalic anatomy without proximal stenosis. There is a tortuous descending aorta. Mediastinum/Nodes: No hilar, mediastinal, or axillary adenopathy. Thyroid gland, trachea, and esophagus demonstrate no significant findings. Lungs/Pleura: There is minimal amount of bibasilar dependent atelectasis. No pleural effusion or pneumothorax. No airspace consolidation. Upper Abdomen: Surgical clips in the right upper quadrant. No acute abnormalities present in the visualized portions of the upper abdomen. Musculoskeletal: No chest wall abnormality. There is rounded lucent lesion seen within the T6 and T3 vertebral bodies, which are nonspecific. Review of the MIP images confirms the above findings. IMPRESSION: No central, segmental, or subsegmental pulmonary embolism. Mild cardiomegaly No acute intrathoracic pathology. Electronically Signed   By: Prudencio Pair M.D.   On: 04/01/2019 23:38   US Venous Img Lower Bilateral  Result Date: 04/01/2019 CLINICAL DATA:  Leg swelling, evaluate for DVT EXAM: BILATERAL LOWER EXTREMITY VENOUS DOPPLER ULTRASOUND TECHNIQUE: Gray-scale sonography with graded compression, as well as color Doppler and duplex ultrasound were performed to evaluate the lower extremity deep venous systems from the level of the common femoral vein and including the common femoral, femoral, profunda femoral, popliteal and calf veins including the posterior tibial, peroneal and gastrocnemius veins when visible. The superficial great saphenous vein was also interrogated. Spectral Doppler was utilized to evaluate flow at rest and with distal augmentation maneuvers in the common femoral, femoral and popliteal veins. COMPARISON:  None. FINDINGS: RIGHT LOWER EXTREMITY Common Femoral Vein: No evidence of thrombus. Normal compressibility, respiratory phasicity and response to augmentation. Saphenofemoral Junction: No evidence of thrombus. Normal compressibility and flow on color Doppler imaging.  Profunda Femoral Vein: No evidence of thrombus. Normal compressibility and flow on color Doppler imaging. Femoral Vein: No evidence of thrombus. Normal compressibility, respiratory phasicity and response to augmentation. Popliteal Vein: No evidence of thrombus. Normal compressibility, respiratory phasicity and response to augmentation. Calf Veins: Limited assessment.  No definite thrombus. Venous Reflux:  None. Other Findings:  None. LEFT LOWER EXTREMITY Common Femoral Vein: No evidence of thrombus. Normal compressibility, respiratory phasicity and response to augmentation. Saphenofemoral Junction: No evidence of thrombus. Normal compressibility and flow on color Doppler imaging. Profunda Femoral Vein: No evidence of thrombus. Normal  compressibility and flow on color Doppler imaging. Femoral Vein: No evidence of thrombus. Normal compressibility, respiratory phasicity and response to augmentation. Popliteal Vein: No evidence of thrombus. Normal compressibility, respiratory phasicity and response to augmentation. Calf Veins: Limited assessment.  No definite thrombus. Venous Reflux:  None. Other Findings:  None. IMPRESSION: No evidence of deep venous thrombosis in either lower extremity to the level of the popliteal vein. Limited assessment of the calf veins but no definite thrombus. Electronically Signed   By: Audie Pinto M.D.   On: 04/01/2019 18:42   Dg Chest Portable 1 View  Result Date: 04/01/2019 CLINICAL DATA:  Leg swelling.  Evaluate for cardiomegaly. EXAM: PORTABLE CHEST 1 VIEW COMPARISON:  06/14/2017 FINDINGS: Normal heart size. Tortuous, unfolded aorta. No pleural effusion or edema. No airspace opacities identified. IMPRESSION: Normal heart size. 1. Tortuous unfolded aorta which may be the sequelae of chronic hypertension. Electronically Signed   By: Kerby Moors M.D.   On: 04/01/2019 17:10    Follow up with PCP in 1 week.  Management plans discussed with the patient, family and they are in  agreement.  CODE STATUS: Full code    Code Status Orders  (From admission, onward)         Start     Ordered   04/02/19 0402  Full code  Continuous     04/02/19 0401        Code Status History    This patient has a current code status but no historical code status.   Advance Care Planning Activity    Advance Directive Documentation     Most Recent Value  Type of Advance Directive  Healthcare Power of Attorney, Living will  Pre-existing out of facility DNR order (yellow form or pink MOST form)  -  "MOST" Form in Place?  -      TOTAL TIME TAKING CARE OF THIS PATIENT ON DAY OF DISCHARGE: more than 35 minutes.   Saundra Shelling M.D on 04/03/2019 at 1:32 PM  Between 7am to 6pm - Pager - [phone]  After 6pm go to [email or web address] - password [email or web address] [facility]  Office  [phone]  CC: Primary care physician; Finis Bud, MD  Note: This dictation was prepared with Dragon dictation along with smaller phrase technology. Any transcriptional errors that result from this process are unintentional.

## 2019-04-04 ENCOUNTER — Encounter: Payer: Self-pay | Admitting: Oncology

## 2019-04-04 ENCOUNTER — Inpatient Hospital Stay: Payer: Medicare Other

## 2019-04-04 ENCOUNTER — Other Ambulatory Visit: Payer: Self-pay

## 2019-04-04 ENCOUNTER — Inpatient Hospital Stay: Payer: Medicare Other | Attending: Oncology | Admitting: Oncology

## 2019-04-04 DIAGNOSIS — M8448XA Pathological fracture, other site, initial encounter for fracture: Secondary | ICD-10-CM | POA: Diagnosis not present

## 2019-04-04 DIAGNOSIS — Z8249 Family history of ischemic heart disease and other diseases of the circulatory system: Secondary | ICD-10-CM | POA: Diagnosis not present

## 2019-04-04 DIAGNOSIS — Z79899 Other long term (current) drug therapy: Secondary | ICD-10-CM | POA: Diagnosis not present

## 2019-04-04 DIAGNOSIS — I10 Essential (primary) hypertension: Secondary | ICD-10-CM | POA: Insufficient documentation

## 2019-04-04 DIAGNOSIS — E785 Hyperlipidemia, unspecified: Secondary | ICD-10-CM | POA: Diagnosis not present

## 2019-04-04 DIAGNOSIS — G20 Parkinson's disease: Secondary | ICD-10-CM | POA: Diagnosis not present

## 2019-04-04 DIAGNOSIS — Z7951 Long term (current) use of inhaled steroids: Secondary | ICD-10-CM | POA: Insufficient documentation

## 2019-04-04 DIAGNOSIS — Z7982 Long term (current) use of aspirin: Secondary | ICD-10-CM | POA: Diagnosis not present

## 2019-04-04 LAB — COMPREHENSIVE METABOLIC PANEL
ALT: 13 U/L (ref 0–44)
AST: 22 U/L (ref 15–41)
Albumin: 3.6 g/dL (ref 3.5–5.0)
Alkaline Phosphatase: 60 U/L (ref 38–126)
Anion gap: 10 (ref 5–15)
BUN: 19 mg/dL (ref 8–23)
CO2: 24 mmol/L (ref 22–32)
Calcium: 8.9 mg/dL (ref 8.9–10.3)
Chloride: 102 mmol/L (ref 98–111)
Creatinine, Ser: 0.83 mg/dL (ref 0.61–1.24)
GFR calc Af Amer: >60 mL/min (ref >60)
GFR calc non Af Amer: >60 mL/min (ref >60)
Glucose, Bld: 138 mg/dL — ABNORMAL HIGH (ref 70–99)
Potassium: 4 mmol/L (ref 3.5–5.1)
Sodium: 136 mmol/L (ref 135–145)
Total Bilirubin: 0.5 mg/dL (ref 0.3–1.2)
Total Protein: 5.9 g/dL — ABNORMAL LOW (ref 6.5–8.1)

## 2019-04-04 LAB — CBC WITH DIFFERENTIAL/PLATELET
Abs Immature Granulocytes: 0.02 10*3/uL (ref 0.00–0.07)
Basophils Absolute: 0 10*3/uL (ref 0.0–0.1)
Basophils Relative: 0 %
Eosinophils Absolute: 0.1 10*3/uL (ref 0.0–0.5)
Eosinophils Relative: 1 %
HCT: 35.7 % — ABNORMAL LOW (ref 39.0–52.0)
Hemoglobin: 12 g/dL — ABNORMAL LOW (ref 13.0–17.0)
Immature Granulocytes: 0 %
Lymphocytes Relative: 16 %
Lymphs Abs: 0.8 10*3/uL (ref 0.7–4.0)
MCH: 29.8 pg (ref 26.0–34.0)
MCHC: 33.6 g/dL (ref 30.0–36.0)
MCV: 88.6 fL (ref 80.0–100.0)
Monocytes Absolute: 0.8 10*3/uL (ref 0.1–1.0)
Monocytes Relative: 14 %
Neutro Abs: 3.6 10*3/uL (ref 1.7–7.7)
Neutrophils Relative %: 69 %
Platelets: 152 10*3/uL (ref 150–400)
RBC: 4.03 MIL/uL — ABNORMAL LOW (ref 4.22–5.81)
RDW: 15.9 % — ABNORMAL HIGH (ref 11.5–15.5)
WBC: 5.3 10*3/uL (ref 4.0–10.5)
nRBC: 0 % (ref 0.0–0.2)

## 2019-04-04 LAB — PSA: Prostatic Specific Antigen: 0.53 ng/mL (ref 0.00–4.00)

## 2019-04-04 NOTE — Progress Notes (Signed)
Pt states he was trying to get up and pushed with his arms on the bed to stand and he felt sharp pain to left shoulder and went to see md and pt bursta cyst and was sent here because of it

## 2019-04-05 LAB — PROTEIN ELECTROPHORESIS, SERUM
A/G Ratio: 1.3 (ref 0.7–1.7)
Albumin ELP: 3.1 g/dL (ref 2.9–4.4)
Alpha-1-Globulin: 0.2 g/dL (ref 0.0–0.4)
Alpha-2-Globulin: 1 g/dL (ref 0.4–1.0)
Beta Globulin: 0.7 g/dL (ref 0.7–1.3)
Gamma Globulin: 0.4 g/dL (ref 0.4–1.8)
Globulin, Total: 2.3 g/dL (ref 2.2–3.9)
Total Protein ELP: 5.4 g/dL — ABNORMAL LOW (ref 6.0–8.5)

## 2019-04-05 LAB — IGG, IGA, IGM
IgA: 66 mg/dL (ref 61–437)
IgG (Immunoglobin G), Serum: 526 mg/dL — ABNORMAL LOW (ref 603–1613)
IgM (Immunoglobulin M), Srm: 30 mg/dL (ref 15–143)

## 2019-04-05 LAB — KAPPA/LAMBDA LIGHT CHAINS
Kappa free light chain: 722.7 mg/L — ABNORMAL HIGH (ref 3.3–19.4)
Kappa, lambda light chain ratio: 176.27 — ABNORMAL HIGH (ref 0.26–1.65)
Lambda free light chains: 4.1 mg/L — ABNORMAL LOW (ref 5.7–26.3)

## 2019-04-06 DIAGNOSIS — G20 Parkinson's disease: Secondary | ICD-10-CM | POA: Diagnosis not present

## 2019-04-06 DIAGNOSIS — M6281 Muscle weakness (generalized): Secondary | ICD-10-CM | POA: Diagnosis not present

## 2019-04-06 DIAGNOSIS — S42001S Fracture of unspecified part of right clavicle, sequela: Secondary | ICD-10-CM | POA: Diagnosis not present

## 2019-04-06 DIAGNOSIS — S42024D Nondisplaced fracture of shaft of right clavicle, subsequent encounter for fracture with routine healing: Secondary | ICD-10-CM | POA: Diagnosis not present

## 2019-04-06 DIAGNOSIS — R2689 Other abnormalities of gait and mobility: Secondary | ICD-10-CM | POA: Diagnosis not present

## 2019-04-06 DIAGNOSIS — M25511 Pain in right shoulder: Secondary | ICD-10-CM | POA: Diagnosis not present

## 2019-04-07 LAB — CULTURE, BLOOD (ROUTINE X 2)
Culture: NO GROWTH
Culture: NO GROWTH

## 2019-04-09 ENCOUNTER — Encounter
Admission: RE | Admit: 2019-04-09 | Discharge: 2019-04-09 | Disposition: A | Discharge status: Home / Self Care | Payer: Medicare Other | Source: Ambulatory Visit | Attending: Oncology | Admitting: Oncology

## 2019-04-09 ENCOUNTER — Other Ambulatory Visit: Payer: Self-pay

## 2019-04-09 DIAGNOSIS — M8448XA Pathological fracture, other site, initial encounter for fracture: Secondary | ICD-10-CM | POA: Diagnosis not present

## 2019-04-09 DIAGNOSIS — M84411A Pathological fracture, right shoulder, initial encounter for fracture: Secondary | ICD-10-CM | POA: Diagnosis not present

## 2019-04-09 MED ORDER — TECHNETIUM TC 99M MEDRONATE IV KIT
20.0000 | PACK | Freq: Once | INTRAVENOUS | Status: AC | PRN
  Administered 2019-04-09: 21.764 via INTRAVENOUS

## 2019-04-11 DIAGNOSIS — M6281 Muscle weakness (generalized): Secondary | ICD-10-CM | POA: Diagnosis not present

## 2019-04-11 DIAGNOSIS — S42024D Nondisplaced fracture of shaft of right clavicle, subsequent encounter for fracture with routine healing: Secondary | ICD-10-CM | POA: Diagnosis not present

## 2019-04-11 DIAGNOSIS — R2689 Other abnormalities of gait and mobility: Secondary | ICD-10-CM | POA: Diagnosis not present

## 2019-04-11 DIAGNOSIS — S42001S Fracture of unspecified part of right clavicle, sequela: Secondary | ICD-10-CM | POA: Diagnosis not present

## 2019-04-11 DIAGNOSIS — G20 Parkinson's disease: Secondary | ICD-10-CM | POA: Diagnosis not present

## 2019-04-11 DIAGNOSIS — Z1159 Encounter for screening for other viral diseases: Secondary | ICD-10-CM | POA: Diagnosis not present

## 2019-04-11 DIAGNOSIS — Z20828 Contact with and (suspected) exposure to other viral communicable diseases: Secondary | ICD-10-CM | POA: Diagnosis not present

## 2019-04-11 DIAGNOSIS — M25511 Pain in right shoulder: Secondary | ICD-10-CM | POA: Diagnosis not present

## 2019-04-12 DIAGNOSIS — R2689 Other abnormalities of gait and mobility: Secondary | ICD-10-CM | POA: Diagnosis not present

## 2019-04-12 DIAGNOSIS — Z23 Encounter for immunization: Secondary | ICD-10-CM | POA: Diagnosis not present

## 2019-04-12 DIAGNOSIS — Z09 Encounter for follow-up examination after completed treatment for conditions other than malignant neoplasm: Secondary | ICD-10-CM | POA: Diagnosis not present

## 2019-04-12 DIAGNOSIS — I878 Other specified disorders of veins: Secondary | ICD-10-CM | POA: Diagnosis not present

## 2019-04-12 DIAGNOSIS — S42001S Fracture of unspecified part of right clavicle, sequela: Secondary | ICD-10-CM | POA: Diagnosis not present

## 2019-04-12 DIAGNOSIS — G20 Parkinson's disease: Secondary | ICD-10-CM | POA: Diagnosis not present

## 2019-04-12 DIAGNOSIS — M25511 Pain in right shoulder: Secondary | ICD-10-CM | POA: Diagnosis not present

## 2019-04-12 DIAGNOSIS — R600 Localized edema: Secondary | ICD-10-CM | POA: Diagnosis not present

## 2019-04-12 DIAGNOSIS — L03119 Cellulitis of unspecified part of limb: Secondary | ICD-10-CM | POA: Diagnosis not present

## 2019-04-12 DIAGNOSIS — S42024D Nondisplaced fracture of shaft of right clavicle, subsequent encounter for fracture with routine healing: Secondary | ICD-10-CM | POA: Diagnosis not present

## 2019-04-12 DIAGNOSIS — M6281 Muscle weakness (generalized): Secondary | ICD-10-CM | POA: Diagnosis not present

## 2019-04-13 DIAGNOSIS — G20 Parkinson's disease: Secondary | ICD-10-CM | POA: Diagnosis not present

## 2019-04-13 DIAGNOSIS — M6281 Muscle weakness (generalized): Secondary | ICD-10-CM | POA: Diagnosis not present

## 2019-04-13 DIAGNOSIS — R2689 Other abnormalities of gait and mobility: Secondary | ICD-10-CM | POA: Diagnosis not present

## 2019-04-13 DIAGNOSIS — S42024D Nondisplaced fracture of shaft of right clavicle, subsequent encounter for fracture with routine healing: Secondary | ICD-10-CM | POA: Diagnosis not present

## 2019-04-13 DIAGNOSIS — M25511 Pain in right shoulder: Secondary | ICD-10-CM | POA: Diagnosis not present

## 2019-04-13 DIAGNOSIS — S42001S Fracture of unspecified part of right clavicle, sequela: Secondary | ICD-10-CM | POA: Diagnosis not present

## 2019-04-13 NOTE — Progress Notes (Signed)
[place]  [phone] [phone]) [phone]  ID: Mauri Pole OB: 1934/06/30  MR#: [id number]  GLO#:[id number]  Patient Care Team: Finis Bud, MD as PCP - General (Family Medicine)  I connected with Mauri Pole on 04/20/19 at 10:45 AM EDT by video enabled telemedicine visit and verified that I am speaking with the correct person using two identifiers.   I discussed the limitations, risks, security and privacy concerns of performing an evaluation and management service by telemedicine and the availability of in-person appointments. I also discussed with the patient that there may be a patient responsible charge related to this service. The patient expressed understanding and agreed to proceed.   Other persons participating in the visit and their role in the encounter: MD, patient, patient's daughter-in-law  Patients location: Home Providers location: Clinic  CHIEF COMPLAINT: Pathologic fracture  INTERVAL HISTORY: Patient agreed to video enabled telemedicine visit to discuss his laboratory and imaging results.  He continues to have pain at the site of his pathologic fracture, but otherwise feels well. He has a resting tremor secondary to his Parkinson's, but no other neurologic complaints.  He denies any recent fevers or illnesses.  He has good appetite and denies weight loss.  He has no chest pain, shortness of breath, cough, or hemoptysis.  He denies any nausea, vomiting, constipation, or diarrhea.  He has no urinary complaints.  Patient offers no further specific complaints today.  REVIEW OF SYSTEMS:   Review of Systems  Constitutional: Negative.  Negative for fever, malaise/fatigue and weight loss.  Respiratory: Negative.  Negative for cough, hemoptysis and shortness of breath.   Cardiovascular: Negative.  Negative for chest pain and leg swelling.  Gastrointestinal: Negative.  Negative for abdominal pain.  Genitourinary: Negative.   Negative for dysuria.  Musculoskeletal: Negative.  Negative for back pain.  Skin: Negative.  Negative for rash.  Neurological: Positive for tremors. Negative for dizziness, focal weakness, weakness and headaches.  Psychiatric/Behavioral: Negative.  The patient is not nervous/anxious.     As per HPI. Otherwise, a complete review of systems is negative.  PAST MEDICAL HISTORY: Past Medical History:  Diagnosis Date   Cancer ([place])    skin   COPD (chronic obstructive pulmonary disease) ([place])    Hyperlipemia    Hypertension    Lymphedema of both lower extremities    Parkinson disease ([place])    Parkinsonism ([place]) 02/21/2015   Spinal stenosis    Tremor     PAST SURGICAL HISTORY: Past Surgical History:  Procedure Laterality Date   APPENDECTOMY     CATARACT EXTRACTION     CHOLECYSTECTOMY     TONSILLECTOMY     VASECTOMY      FAMILY HISTORY: Family History  Problem Relation Age of Onset   Cancer Mother    Heart disease Father     ADVANCED DIRECTIVES (Y/N):  N  HEALTH MAINTENANCE: Social History   Tobacco Use   Smoking status: Never Smoker   Smokeless tobacco: Never Used  Substance Use Topics   Alcohol use: No    Alcohol/week: 0.0 standard drinks   Drug use: No     Colonoscopy:  PAP:  Bone density:  Lipid panel:  Allergies  Allergen Reactions   Carbidopa-Levodopa Other (See Comments)    Severe stomach pains, can take rytary   Oxycodone-Acetaminophen Other (See Comments)    "boils on skin"   Tylenol [Acetaminophen]    Tyloxapol    Pramipexole Nausea Only    Current Outpatient Medications  Medication Sig Dispense Refill   aspirin 81 MG tablet Take 81 mg by mouth daily.     BREO ELLIPTA 100-25 MCG/INH AEPB USE 1 INHALATION ORALLY    DAILY 60 each 1   Calcium Citrate 250 MG TABS Take 1 tablet by mouth daily.      Carbidopa-Levodopa ER (RYTARY) 48.75-195 MG CPCR Take 195 mg by mouth 5 (five) times daily. 450 capsule 3   cetirizine  (ZYRTEC) 10 MG tablet Take 10 mg by mouth daily.     finasteride (PROSCAR) 5 MG tablet Take 5 mg by mouth daily.     fluticasone (FLONASE) 50 MCG/ACT nasal spray Place into both nostrils daily.     ipratropium (ATROVENT) 0.03 % nasal spray Place 0.03 mLs into both nostrils 3 (three) times daily as needed.  11   niacin 500 MG tablet Take 500 mg by mouth every morning.      olmesartan (BENICAR) 20 MG tablet Take 20 mg by mouth daily.     omeprazole (PRILOSEC) 40 MG capsule Take 40 mg by mouth daily.     PROAIR HFA 108 (90 Base) MCG/ACT inhaler INHALE 2 PUFFS INTO THE LUNGS AS NEEDED. 17 Inhaler 3   Simethicone (GAS RELIEF PO) Take 1 Dose by mouth as needed.      simvastatin (ZOCOR) 10 MG tablet Take 10 mg by mouth daily.     tamsulosin (FLOMAX) 0.4 MG CAPS capsule Take 0.4 mg by mouth daily.      vitamin C (ASCORBIC ACID) 500 MG tablet Take 500 mg by mouth daily.     No current facility-administered medications for this visit.     OBJECTIVE: There were no vitals filed for this visit.   There is no height or weight on file to calculate BMI.    ECOG FS:1 - Symptomatic but completely ambulatory  General: Well-developed, well-nourished, no acute distress. HEENT: Normocephalic. Neuro: Alert, answering all questions appropriately. Cranial nerves grossly intact. Psych: Normal affect.   LAB RESULTS:  Lab Results  Component Value Date   NA 136 04/04/2019   K 4.0 04/04/2019   CL 102 04/04/2019   CO2 24 04/04/2019   GLUCOSE 138 (H) 04/04/2019   BUN 19 04/04/2019   CREATININE 0.83 04/04/2019   CALCIUM 8.9 04/04/2019   PROT 5.9 (L) 04/04/2019   ALBUMIN 3.6 04/04/2019   AST 22 04/04/2019   ALT 13 04/04/2019   ALKPHOS 60 04/04/2019   BILITOT 0.5 04/04/2019   GFRNONAA >60 04/04/2019   GFRAA >60 04/04/2019    Lab Results  Component Value Date   WBC 5.3 04/04/2019   NEUTROABS 3.6 04/04/2019   HGB 12.0 (L) 04/04/2019   HCT 35.7 (L) 04/04/2019   MCV 88.6 04/04/2019   PLT  152 04/04/2019     STUDIES: Ct Angio Chest Pe W And/or Wo Contrast  Result Date: 04/01/2019 CLINICAL DATA:  Bilateral leg swelling and discomfort EXAM: CT ANGIOGRAPHY CHEST WITH CONTRAST TECHNIQUE: Multidetector CT imaging of the chest was performed using the standard protocol during bolus administration of intravenous contrast. Multiplanar CT image reconstructions and MIPs were obtained to evaluate the vascular anatomy. CONTRAST:  51m OMNIPAQUE IOHEXOL 350 MG/ML SOLN COMPARISON:  None. FINDINGS: Cardiovascular: There is a optimal opacification of the pulmonary arteries. There is no central,segmental, or subsegmental filling defects within the pulmonary arteries. There is mild cardiomegaly. No pericardial effusion thickening. No evidence right heart strain. There is normal three-vessel brachiocephalic anatomy without proximal stenosis. There is a tortuous descending aorta. Mediastinum/Nodes: No  hilar, mediastinal, or axillary adenopathy. Thyroid gland, trachea, and esophagus demonstrate no significant findings. Lungs/Pleura: There is minimal amount of bibasilar dependent atelectasis. No pleural effusion or pneumothorax. No airspace consolidation. Upper Abdomen: Surgical clips in the right upper quadrant. No acute abnormalities present in the visualized portions of the upper abdomen. Musculoskeletal: No chest wall abnormality. There is rounded lucent lesion seen within the T6 and T3 vertebral bodies, which are nonspecific. Review of the MIP images confirms the above findings. IMPRESSION: No central, segmental, or subsegmental pulmonary embolism. Mild cardiomegaly No acute intrathoracic pathology. Electronically Signed   By: Prudencio Pair M.D.   On: 04/01/2019 23:38   Nm Bone Scan Whole Body  Result Date: 04/10/2019 CLINICAL DATA:  Pathologic right clavicle fracture. History of nonmelanoma skin cancer. EXAM: NUCLEAR MEDICINE WHOLE BODY BONE SCAN TECHNIQUE: Whole body anterior and posterior images were  obtained approximately 3 hours after intravenous injection of radiopharmaceutical. RADIOPHARMACEUTICALS:  21.8 mCi Technetium-58mMDP IV COMPARISON:  CT chest 04/11/2019. FINDINGS: There is focally increased activity at the mid right clavicle, corresponding with the fracture seen on recent CT. Faintly increased activity is noted in the right scapular body, and this may correspond with a small lucent lesion on CT, best seen on coronal image 9/78. There is faintly increased activity along the anterior aspect of the right 10th rib. There is focally increased activity along the posterior aspect of the right sacroiliac joint, best seen on the posterior images. There is faintly increased activity in the thoracolumbar spine which appears degenerative. The soft tissue activity appears normal. IMPRESSION: 1. Nonspecific uptake in the mid right clavicle at the site of the recently demonstrated fracture. 2. Nonspecific focal uptake in the right scapular body and posterior aspect of the right sacroiliac joint, potentially metastases or foci of multiple myeloma. No other osseous activity suspicious for metastatic disease. Electronically Signed   By: WRichardean SaleM.D.   On: 04/10/2019 13:00   Ct Abdomen Pelvis W Contrast  Result Date: 04/17/2019 CLINICAL DATA:  Pathologic right clavicle fracture. Assess for primary malignancy. EXAM: CT ABDOMEN AND PELVIS WITH CONTRAST TECHNIQUE: Multidetector CT imaging of the abdomen and pelvis was performed using the standard protocol following bolus administration of intravenous contrast. CONTRAST:  1059mOMNIPAQUE IOHEXOL 300 MG/ML  SOLN COMPARISON:  Chest CT 04/01/2019. Whole-body bone scan 04/09/2019. Report only from abdominopelvic CT 01/05/2009. FINDINGS: Lower chest: Mild emphysematous changes at both lung bases. No significant pleural or pericardial effusion. Hepatobiliary: There is an 8 mm low-density lesion in the left hepatic lobe on image 22/2, too small to characterize. No  other focal hepatic abnormalities are identified. There is no significant biliary dilatation post cholecystectomy. Pancreas: Unremarkable. No pancreatic ductal dilatation or surrounding inflammatory changes. Spleen: Normal in size without focal abnormality. Adrenals/Urinary Tract: Both adrenal glands appear normal. No suspicious renal findings. No evidence of urinary tract calculus or hydronephrosis. The bladder appears normal. Stomach/Bowel: No evidence of bowel wall thickening, distention or surrounding inflammatory change. There are extensive diverticular changes throughout the colon, greatest distally. Vascular/Lymphatic: There are no enlarged abdominal or pelvic lymph nodes. Mild aortic and branch vessel atherosclerosis. Reproductive: Mild enlargement of the prostate gland. The seminal vesicles appear normal. Other: Tiny umbilical hernia containing only fat. No ascites or peritoneal nodularity. Musculoskeletal: There are multiple lytic osseous lesions, some associated with soft tissue masses. The most prominent lesions involve the left 7th rib near the costovertebral junction (image 4/2), the T8 vertebral body (image 8/2), the L2 vertebral body, measuring 2.3 cm  on image 43/2, the left 8th rib laterally measuring 3.1 cm on image 19/2, and the inferior aspect of the right sacrum, measuring up to 2.8 cm on image 71/2. This latter finding is associated with a soft tissue mass which extends into the sciatic notch. There are several other smaller lesions. No pathologic fractures are identified. There is a peripherally enhancing fluid collection posterior to the right femoral greater trochanter without associated soft tissue mass, likely trochanteric bursitis. IMPRESSION: 1. Multiple lytic osseous lesions, mostly occult on recent whole-body bone scan, favoring multiple myeloma over metastatic disease. 2. No primary malignancy or definite evidence of non osseous metastatic disease in the abdomen or pelvis. There is a  small indeterminate low-density lesion in the left hepatic lobe. 3. Diffuse colonic diverticulosis. 4.  Aortic Atherosclerosis (ICD10-I70.0). Electronically Signed   By: Richardean Sale M.D.   On: 04/17/2019 13:08   US Venous Img Lower Bilateral  Result Date: 04/01/2019 CLINICAL DATA:  Leg swelling, evaluate for DVT EXAM: BILATERAL LOWER EXTREMITY VENOUS DOPPLER ULTRASOUND TECHNIQUE: Gray-scale sonography with graded compression, as well as color Doppler and duplex ultrasound were performed to evaluate the lower extremity deep venous systems from the level of the common femoral vein and including the common femoral, femoral, profunda femoral, popliteal and calf veins including the posterior tibial, peroneal and gastrocnemius veins when visible. The superficial great saphenous vein was also interrogated. Spectral Doppler was utilized to evaluate flow at rest and with distal augmentation maneuvers in the common femoral, femoral and popliteal veins. COMPARISON:  None. FINDINGS: RIGHT LOWER EXTREMITY Common Femoral Vein: No evidence of thrombus. Normal compressibility, respiratory phasicity and response to augmentation. Saphenofemoral Junction: No evidence of thrombus. Normal compressibility and flow on color Doppler imaging. Profunda Femoral Vein: No evidence of thrombus. Normal compressibility and flow on color Doppler imaging. Femoral Vein: No evidence of thrombus. Normal compressibility, respiratory phasicity and response to augmentation. Popliteal Vein: No evidence of thrombus. Normal compressibility, respiratory phasicity and response to augmentation. Calf Veins: Limited assessment.  No definite thrombus. Venous Reflux:  None. Other Findings:  None. LEFT LOWER EXTREMITY Common Femoral Vein: No evidence of thrombus. Normal compressibility, respiratory phasicity and response to augmentation. Saphenofemoral Junction: No evidence of thrombus. Normal compressibility and flow on color Doppler imaging. Profunda  Femoral Vein: No evidence of thrombus. Normal compressibility and flow on color Doppler imaging. Femoral Vein: No evidence of thrombus. Normal compressibility, respiratory phasicity and response to augmentation. Popliteal Vein: No evidence of thrombus. Normal compressibility, respiratory phasicity and response to augmentation. Calf Veins: Limited assessment.  No definite thrombus. Venous Reflux:  None. Other Findings:  None. IMPRESSION: No evidence of deep venous thrombosis in either lower extremity to the level of the popliteal vein. Limited assessment of the calf veins but no definite thrombus. Electronically Signed   By: Audie Pinto M.D.   On: 04/01/2019 18:42   Dg Chest Portable 1 View  Result Date: 04/01/2019 CLINICAL DATA:  Leg swelling.  Evaluate for cardiomegaly. EXAM: PORTABLE CHEST 1 VIEW COMPARISON:  06/14/2017 FINDINGS: Normal heart size. Tortuous, unfolded aorta. No pleural effusion or edema. No airspace opacities identified. IMPRESSION: Normal heart size. 1. Tortuous unfolded aorta which may be the sequelae of chronic hypertension. Electronically Signed   By: Kerby Moors M.D.   On: 04/01/2019 17:10    ASSESSMENT: Pathologic fracture  PLAN:    1. Pathologic fracture: X-ray of right clavicle reviewed independently highly suspicious for pathologic fracture.  Imaging reviewed independently and reported as above.  Although nuclear  med bone scan was unrevealing, CT of the abdomen pelvis did reveal multiple osseous lytic lesions highly suspicious for underlying multiple myeloma.  Patient's M spike is 0.0, but he does have significantly elevated kappa free light chains possibly indicating a kappa chain myeloma.  PSA is within normal limits.  He has no other evidence of endorgan damage with a normal CBC, creatinine, and calcium.  We will get a bone marrow biopsy next week to confirm the diagnosis.  Patient will then have another video assisted telemedicine visit in approximately 2 weeks to  discuss the results and treatment planning. 2.  Right clavicle fracture: Patient was given a referral back to [facility] for further evaluation.    I provided 25 minutes of face-to-face video visit time during this encounter, and > 50% was spent counseling as documented under my assessment & plan.   Patient expressed understanding and was in agreement with this plan. He also understands that He can call clinic at any time with any questions, concerns, or complaints.   Cancer Staging No matching staging information was found for the patient.  Lloyd Huger, MD   04/20/2019 4:26 PM

## 2019-04-17 ENCOUNTER — Ambulatory Visit
Admission: RE | Admit: 2019-04-17 | Discharge: 2019-04-17 | Disposition: A | Discharge status: Home / Self Care | Payer: Medicare Other | Source: Ambulatory Visit | Attending: Oncology | Admitting: Oncology

## 2019-04-17 ENCOUNTER — Other Ambulatory Visit: Payer: Self-pay

## 2019-04-17 DIAGNOSIS — R2689 Other abnormalities of gait and mobility: Secondary | ICD-10-CM | POA: Diagnosis not present

## 2019-04-17 DIAGNOSIS — S42001S Fracture of unspecified part of right clavicle, sequela: Secondary | ICD-10-CM | POA: Diagnosis not present

## 2019-04-17 DIAGNOSIS — I700 Atherosclerosis of aorta: Secondary | ICD-10-CM | POA: Insufficient documentation

## 2019-04-17 DIAGNOSIS — M25511 Pain in right shoulder: Secondary | ICD-10-CM | POA: Diagnosis not present

## 2019-04-17 DIAGNOSIS — M6281 Muscle weakness (generalized): Secondary | ICD-10-CM | POA: Diagnosis not present

## 2019-04-17 DIAGNOSIS — M8448XA Pathological fracture, other site, initial encounter for fracture: Secondary | ICD-10-CM | POA: Diagnosis not present

## 2019-04-17 DIAGNOSIS — K5730 Diverticulosis of large intestine without perforation or abscess without bleeding: Secondary | ICD-10-CM | POA: Insufficient documentation

## 2019-04-17 DIAGNOSIS — S42024D Nondisplaced fracture of shaft of right clavicle, subsequent encounter for fracture with routine healing: Secondary | ICD-10-CM | POA: Diagnosis not present

## 2019-04-17 DIAGNOSIS — M84411A Pathological fracture, right shoulder, initial encounter for fracture: Secondary | ICD-10-CM | POA: Diagnosis not present

## 2019-04-17 DIAGNOSIS — G20 Parkinson's disease: Secondary | ICD-10-CM | POA: Diagnosis not present

## 2019-04-17 IMAGING — CT CT ABD-PELV W/ CM
2 of 5 series · 15 of 46 positions shown, 17 images · IV contrast (APPLIED)
Comparison: Chest CT 04/01/2019. Whole-body bone scan 04/09/2019.
Report only from abdominopelvic CT 01/05/2009.

CLINICAL DATA: Pathologic right clavicle fracture. Assess for
primary malignancy.

EXAM:
CT ABDOMEN AND PELVIS WITH CONTRAST
TECHNIQUE: Multidetector CT imaging of the abdomen and pelvis was performed
using the standard protocol following bolus administration of
intravenous contrast.
CONTRAST:  100mL OMNIPAQUE IOHEXOL 300 MG/ML  SOLN

[Series 2: axial st · axial · 0.83mm/px · z∈[-373,+97]mm · 12 of 106 slices shown, 14 images]
[im 6/106  soft-tissue]
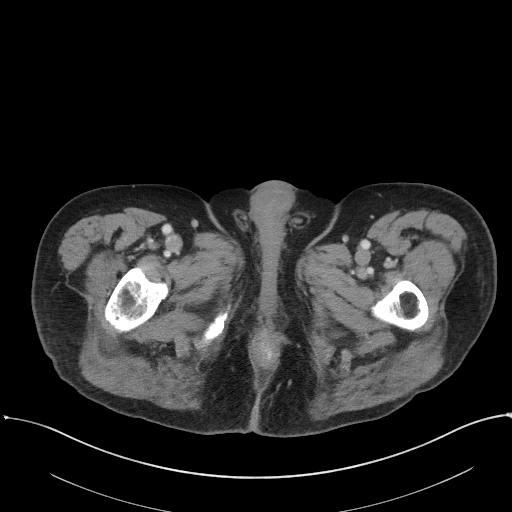
[im 6/106  bone]
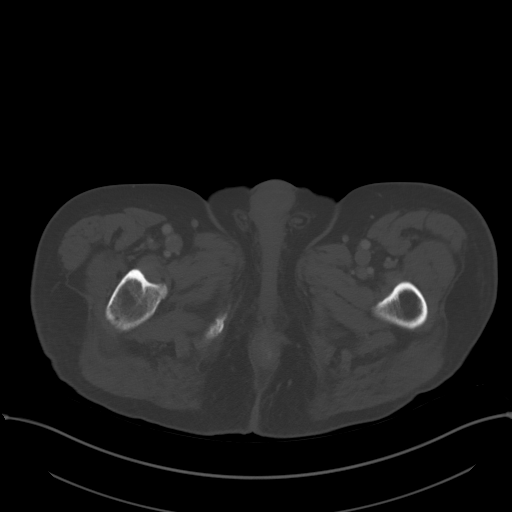
[im 17/106  soft-tissue]
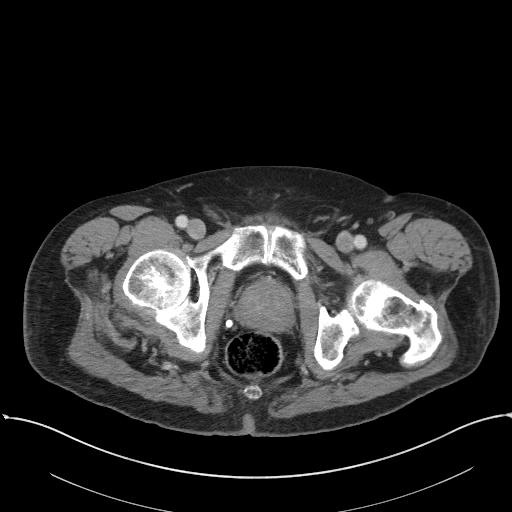
[im 23/106  soft-tissue]
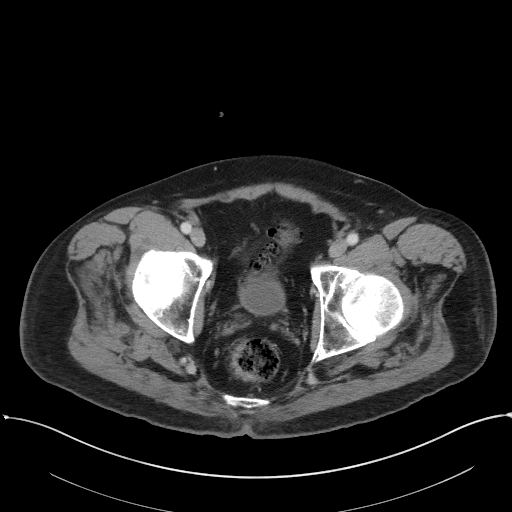
[im 34/106  soft-tissue]
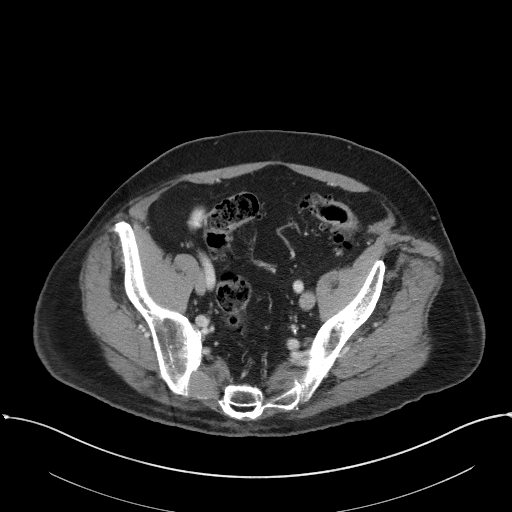
[im 39/106  soft-tissue]
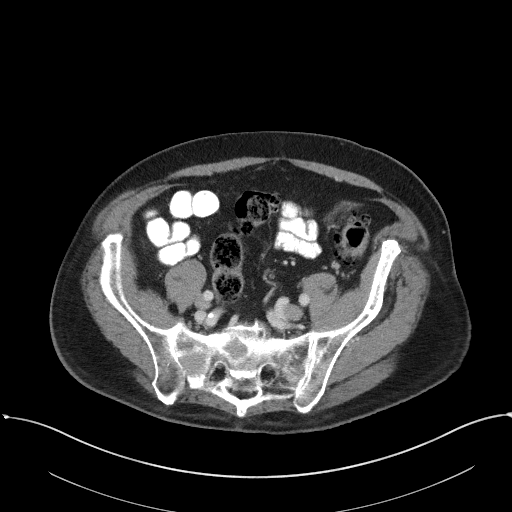
[im 50/106  soft-tissue]
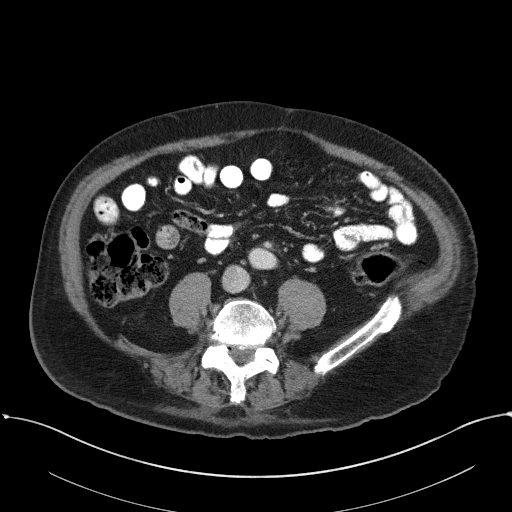
[im 56/106  soft-tissue]
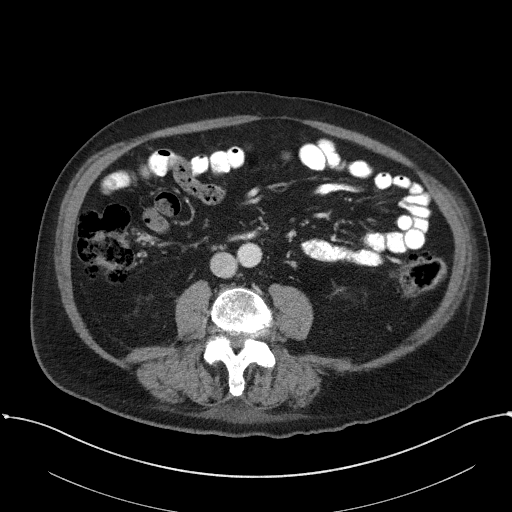
[im 67/106  soft-tissue]
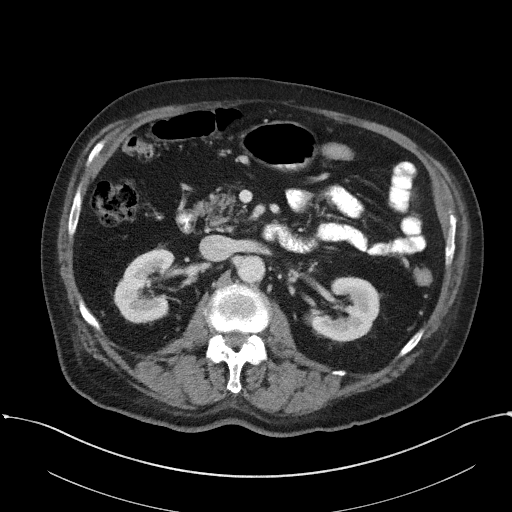
[im 72/106  soft-tissue]
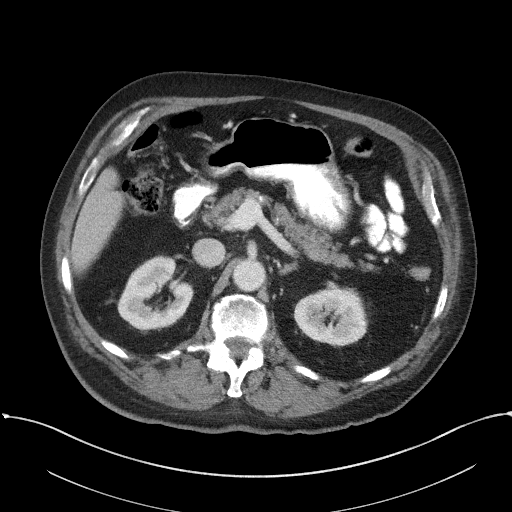
[im 72/106  bone]
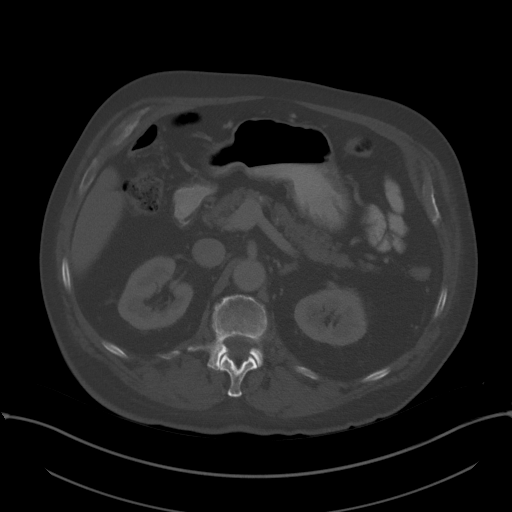
[im 83/106  soft-tissue]
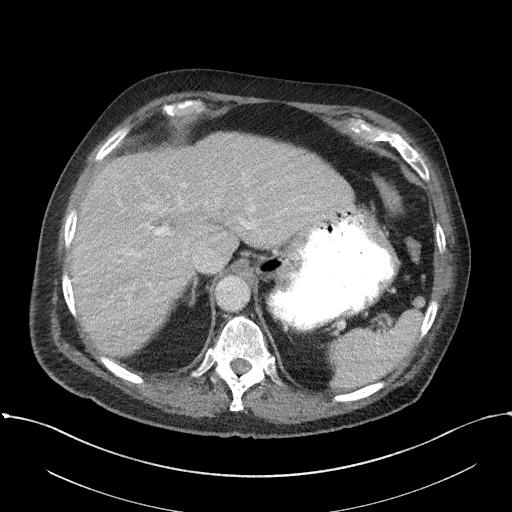
[im 89/106  soft-tissue]
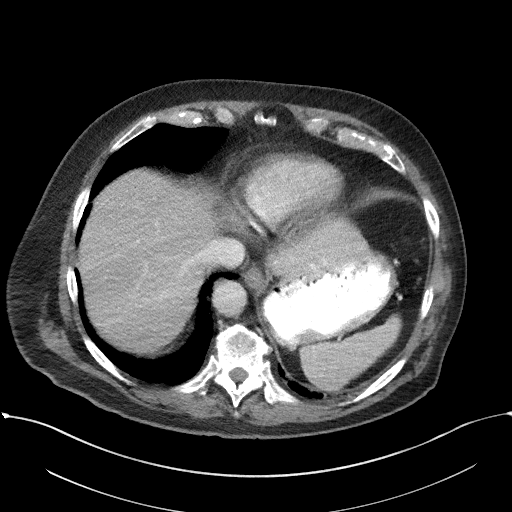
[im 100/106  soft-tissue]
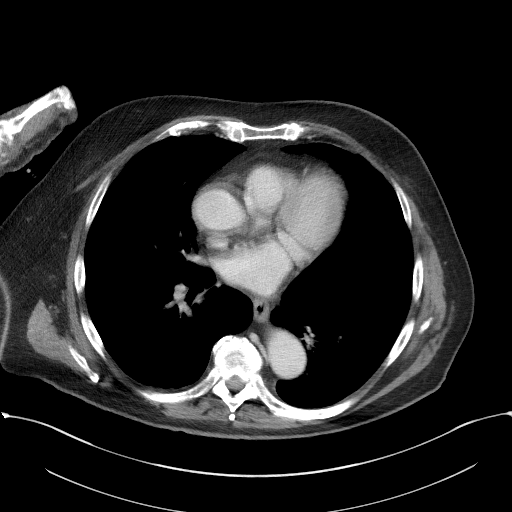

[Series 5: coronal st · coronal · 0.78mm/px · 3 of 95 slices shown]
[im 32/95  soft-tissue]
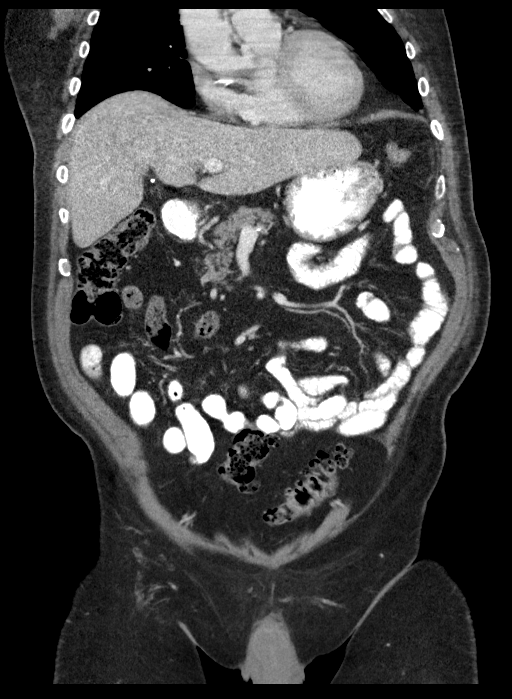
[im 42/95  soft-tissue]
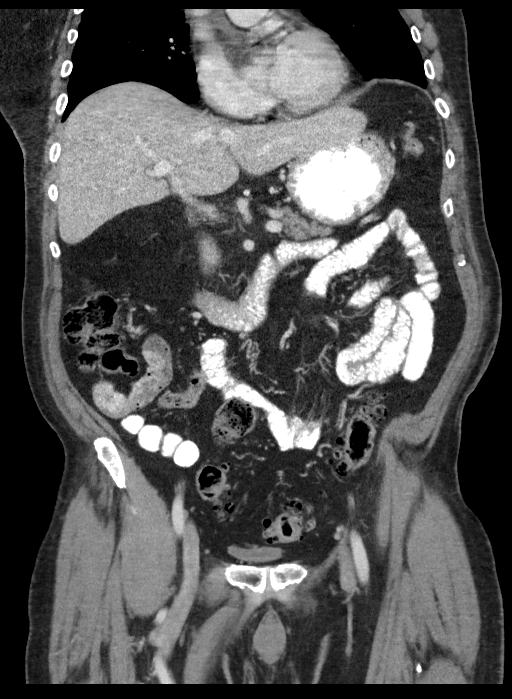
[im 53/95  soft-tissue]
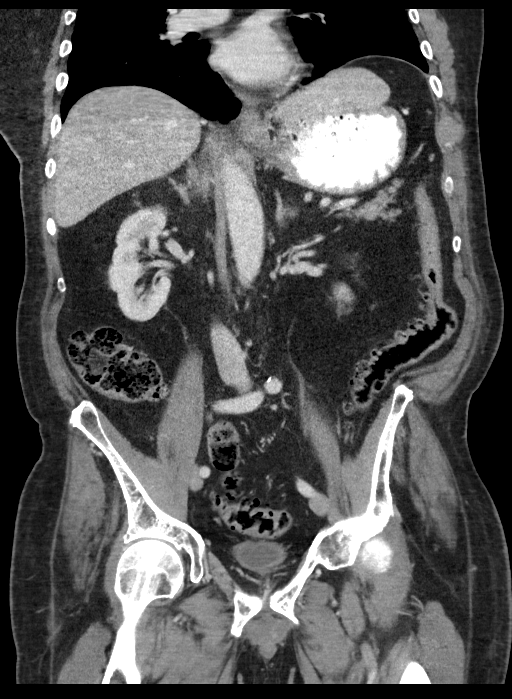

[15 of 46 positions shown; findings below may reference images not displayed]

FINDINGS: Lower chest: Mild emphysematous changes at both lung bases. No
significant pleural or pericardial effusion.

Hepatobiliary: There is an 8 mm low-density lesion in the left
hepatic lobe on image [DATE], too small to characterize. No other
focal hepatic abnormalities are identified. There is no significant
biliary dilatation post cholecystectomy.

Pancreas: Unremarkable. No pancreatic ductal dilatation or
surrounding inflammatory changes.

Spleen: Normal in size without focal abnormality.

Adrenals/Urinary Tract: Both adrenal glands appear normal. No
suspicious renal findings. No evidence of urinary tract calculus or
hydronephrosis. The bladder appears normal.

Stomach/Bowel: No evidence of bowel wall thickening, distention or
surrounding inflammatory change. There are extensive diverticular
changes throughout the colon, greatest distally.

Vascular/Lymphatic: There are no enlarged abdominal or pelvic lymph
nodes. Mild aortic and branch vessel atherosclerosis.

Reproductive: Mild enlargement of the prostate gland. The seminal
vesicles appear normal.

Other: Tiny umbilical hernia containing only fat. No ascites or
peritoneal nodularity.

Musculoskeletal: There are multiple lytic osseous lesions, some
associated with soft tissue masses. The most prominent lesions
involve the left 7th rib near the costovertebral junction (image
[DATE]), the T8 vertebral body (image [DATE]), the L2 vertebral body,
measuring 2.3 cm on image 43/2, the left 8th rib laterally measuring
3.1 cm on image [DATE], and the inferior aspect of the right sacrum,
measuring up to 2.8 cm on image 71/2. This latter finding is
associated with a soft tissue mass which extends into the sciatic
notch. There are several other smaller lesions. No pathologic
fractures are identified. There is a peripherally enhancing fluid
collection posterior to the right femoral greater trochanter without
associated soft tissue mass, likely trochanteric bursitis.
IMPRESSION: 1. Multiple lytic osseous lesions, mostly occult on recent
whole-body bone scan, favoring multiple myeloma over metastatic
disease.
2. No primary malignancy or definite evidence of non osseous
metastatic disease in the abdomen or pelvis. There is a small
indeterminate low-density lesion in the left hepatic lobe.
3. Diffuse colonic diverticulosis.
4.  Aortic Atherosclerosis ([id number]).

## 2019-04-17 MED ORDER — IOHEXOL 300 MG/ML  SOLN
100.0000 mL | Freq: Once | INTRAMUSCULAR | Status: AC | PRN
  Administered 2019-04-17: 100 mL via INTRAVENOUS

## 2019-04-18 ENCOUNTER — Ambulatory Visit (INDEPENDENT_AMBULATORY_CARE_PROVIDER_SITE_OTHER): Payer: Medicare Other | Admitting: Neurology

## 2019-04-18 ENCOUNTER — Encounter: Payer: Self-pay | Admitting: Neurology

## 2019-04-18 VITALS — BP 108/74 | HR 85 | Ht 70.0 in | Wt 198.0 lb

## 2019-04-18 DIAGNOSIS — G20 Parkinson's disease: Secondary | ICD-10-CM

## 2019-04-18 DIAGNOSIS — M84411S Pathological fracture, right shoulder, sequela: Secondary | ICD-10-CM | POA: Diagnosis not present

## 2019-04-18 DIAGNOSIS — M7989 Other specified soft tissue disorders: Secondary | ICD-10-CM

## 2019-04-18 NOTE — Progress Notes (Signed)
Subjective:    Patient ID: Adam Moss is a 85 y.o. male.  HPI     Interim history:   Adam Moss is a very pleasant 85 year old left-handed gentleman with an underlying medical history of degenerative spine disease, allergies, BPH, hypertension, arthritis, reflux disease, and insomnia, who presents for follow-up consultation of his Parkinson's disease. The patient is accompanied by his daughter-in-law, Adam Moss, today. Of note, he missed an appointment on 02/19/2019.  I last saw him on 08/21/2018, at which time he reported feeling fairly stable, with the exception of increase in tremor noted.  He was on Rytary 4 times a day about 6 hourly.  He was advised to try to increase the Rytary 195 mg strength to 1 pill 5 times a day, about 5 hourly.   Today, 04/18/2019 (all dictated new, as well as above notes, some dictation done in note pad or Word, outside of chart, may appear as copied):  He reports that he noticed an increase in tremor on the left side.  Unfortunately, he has had interim health issues. Of note, he was hospitalized earlier this month with increased edema.  He was in the hospital from 04/01/2019 through 04/03/2019.  He has a history of lymphedema.  He had worsening lower extremity swelling.  He was treated symptomatically with Lasix but had blood pressure drop. He is supposed to see a vascular specialist.  He was subsequently seen by oncology for a pathological fracture of the right clavicle.  He had tried push-ups to stand up from the seated position and felt a sharp pain in the right clavicle area.  He was noted to have a fracture.  He had a subsequent bone scan on 04/09/2019 which showed some suspicious osseous activity in the right scapula and right sacroiliac joint, potentially suspicious for metastatic disease or multiple myeloma. The oncologist ordered a CT abdomen and pelvis, he had a CT abdomen and pelvis with contrast on 04/17/2019 and I reviewed the results: IMPRESSION: 1. Multiple  lytic osseous lesions, mostly occult on recent whole-body bone scan, favoring multiple myeloma over metastatic disease. 2. No primary malignancy or definite evidence of non osseous metastatic disease in the abdomen or pelvis. There is a small indeterminate low-density lesion in the left hepatic lobe. 3. Diffuse colonic diverticulosis. 4.  Aortic Atherosclerosis (ICD10-I70.0).  He has an appointment with the oncologist this week.  His right arm is in a sling.  He has some pain in the right clavicle area when he lifts up his right arm, he tries to keep it still as possible. He has had difficulty putting his compression socks on of course.  Fortunately, they have been able to hire some help, he has an aide about every other day for couple of hours to 3 hours as needed, to help with laundry, dishes, showering, and putting the compression socks on. He has intermittently been using a walking stick/staff on the left side. Constipation is not a big issue currently but he admits that he does not drink enough water, sometimes he has leg cramps and his hands also cramp up sometimes.  He does not sleep very well.  His daughter-in-law has noticed issues with his cognitive function as and slower in thinking and processing in also some confusion at times.  The patient's allergies, current medications, family history, past medical history, past social history, past surgical history and problem list were reviewed and updated as appropriate.    Previously (copied from previous notes for reference):    02/15/2018, at  which time he reported feeling stable. He had occasional issues with constipation and had some mild forgetfulness, otherwise was doing well. I suggested he continue with his Rytary.   I saw him on 08/18/2017, at which time he felt stable. We continued with his Rytary, 195 mg qid.      I saw him on 04/18/17, at which time he reported worsening tremor, he was having some difficulty with fine motor  control. He was taking Rytary 4 times a day and at bedtime. He was trying to keep set schedule for his bedtime routine and wake up routine and medication regimen. I suggested he change the timing of his medication to 1 pill at 6, 11, 4 PM and 10 PM. We talked about potentially increasing the medication to 1 pill 5 times a day.   I saw him on 10/13/2016, at which time he reported doing okay. He had occasional muscle pain in different areas of his lower back and also thighs, sometimes around the ankles. He was trying to walk on a regular basis. Memory mood were stable. Sadly, his wife passed away in 2016/06/22. I suggested, we keep his meds the same.     We had to cancel an appointment for 06/08/2016 and he missed an appointment on 07/21/2016. I saw him on 02/04/2016, at which time he reported doing okay, tolerating Rytary 195 mg qid without significant side effects. He was not always exercising regularly. He does visit his wife in the afternoon every day. He was not always hydrating well enough. He had gained some weight. He had no recent falls, no complaints of hallucinations or memory issues, occasional depressed mood but no sustained symptoms of depression. I suggested we continue with his medication regimen. He was encouraged to hydrate better and exercise on a more regular basis.   I saw him on 09/24/2015, at which time he reported doing a little better, able to tolerate the Rytary qid. Thankfully, h had no recent falls. He was not sleeping very well at night. He would visit his wife in memory care every day. His 3 children with visit about once a month. Is trying to hydrate well. He had noticed some ankle swelling and residual knee pain bilaterally. He was able to pursue his hobby of building model ships. I suggested he continue with Rytary at the current dose. He was encouraged to try melatonin for sleep.    I saw him on 05/26/2015, at which time he reported doing somewhat better with the new  medication, Rytary, with improvement noted in fine motor skills and tremors. He had recent blood work through his primary care physician which I reviewed at the time: Lipid profile was unremarkable with the exception of triglycerides borderline at 155, BMP was normal and CBC was normal. He reported that his leg swelling was a little better. He was avoiding nighttime driving. He was visiting his wife and memory care regularly. He was able to tolerate the new medication which was really reassuring. She was taking Rytary 195 mg, one pill at 9 AM, 1 pill at 5 PM, 1 pill at bedtime which is usually around midnight. He had gained about 10 pounds and was trying to lose weight. He worried about his weight gain but did admit to having good food and lots of choices for dessert at his assisted living place. I suggested a gentle increase in his medication to 1 pill 4 times a day.   I first met him on 02/21/2015 at the request  of his primary neurologist, at which time the patient reported a history of left-sided tremor, fine motor dyscontrol, and gait difficulties. Symptoms dated back to 2-3 years prior. He had multiple medication intolerances particularly issues with GI side effects. I suggested a trial of Rytary, 95 mg strength with titration to 2 pills 3 times a day. I also suggested we proceed with a DaT scan and I referred him to [facility] nuclear medicine. He had the study on 03/19/2015 which showed abnormal image grade 1: Asymmetric uptake with normal or almost normal putamen activity in one hemisphere and with a more marked reduction in the contralateral putamen. This pattern of activity can be seen with Parkinson's disease or related syndromes.    Of note, the radiotracer uptake was decreased on the right. We called the patient with the test results. The patient emailed me in August reporting a slight decrease in his tremor with a new medication and ability to tolerated thus far. He asked  for change and pill strength of possible because the medication is very expensive. I changed him to 195 mg strength one pill 3 times a day.    02/21/2015: He has a history of left-sided tremor, fine motor dyscontrol, and gait difficulty. 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 Symptoms date back to 2014 or the year before, when he started having difficulty getting out of a chair, tremors on the left side, slowness, and he started seeing you at the neuroscience center in [facility]. He was diagnosed with Parkinson's disease and tried on different medications but had side effects, primarily GI related side effects. He has a history of cholecystectomy. Sinemet caused severe abdominal pain and nausea. He was tried on Neupro which also caused stomach pain, and on Azilect low-dose he did not think he had any response. He has been on amantadine. His previous records were reviewed: This includes neurologic office notes from 01/10/2015, 11/08/2014, 09/09/2014, 08/19/2014, 05/20/2014, 02/18/2014, 11/30/2013, 07/06/2013, 06/01/2013, and 02/23/2013. He had a brain MRI years ago but results are not available for my review. He is currently on amantadine 100 mg twice daily. In 2015 he was tried on pramipexole but had severe GI upset. In July 2015 he reported that he had improvement with Neupro but stomach pain. In January 2016 he tried Sinemet but had to stop after a few days only. Earlier this year his Azilect was discontinued. He has no family history of Parkinson's disease. He reports mild memory loss and mild sleep issues. He currently resides in independent living at the [facility] [place]. This is in [place]. His wife is a Marine scientist care. He drives. He has no recent history of falls. He tries to stay active physically. He has right knee pain and wears a soft brace around his right knee. He has a remote  history of injury to his right knee. He has been off of amantadine for the past 2 weeks or so. He had significant blurry vision while on it to the point where he had to see an ophthalmologist. His blurry vision has improved since he stopped the amantadine. He feels that his symptoms have been slowly progressive. He is primarily bothered by his fine motor dyscontrol and tremor. He drives without significant problems but does not drive long distances.  His Past Medical History Is Significant For: Past Medical History:  Diagnosis Date  .  Cancer ([place])    skin  . COPD (chronic obstructive pulmonary disease) ([place])   . Hyperlipemia   . Hypertension   . Lymphedema of both lower extremities   . Parkinson disease ([place])   . Parkinsonism ([place]) 02/21/2015  . Spinal stenosis   . Tremor     His Past Surgical History Is Significant For: Past Surgical History:  Procedure Laterality Date  . APPENDECTOMY    . CATARACT EXTRACTION    . CHOLECYSTECTOMY    . TONSILLECTOMY    . VASECTOMY      His Family History Is Significant For: Family History  Problem Relation Age of Onset  . Cancer Mother   . Heart disease Father     His Social History Is Significant For: Social History   Socioeconomic History  . Marital status: Married    Spouse name: Not on file  . Number of children: 3  . Years of education: PhD  . Highest education level: Not on file  Occupational History  . Occupation: Retired  Scientific laboratory technician  . Financial resource strain: Not on file  . Food insecurity    Worry: Not on file    Inability: Not on file  . Transportation needs    Medical: Not on file    Non-medical: Not on file  Tobacco Use  . Smoking status: Never Smoker  . Smokeless tobacco: Never Used  Substance and Sexual Activity  . Alcohol use: No    Alcohol/week: 0.0 standard drinks  . Drug use: No  . Sexual activity: Not Currently  Lifestyle  . Physical activity    Days per week: Not on file    Minutes per session:  Not on file  . Stress: Not on file  Relationships  . Social Herbalist on phone: Not on file    Gets together: Not on file    Attends religious service: Not on file    Active member of club or organization: Not on file    Attends meetings of clubs or organizations: Not on file    Relationship status: Not on file  Other Topics Concern  . Not on file  Social History Narrative   Denies caffeine use     His Allergies Are:  Allergies  Allergen Reactions  . Carbidopa-Levodopa Other (See Comments)    Severe stomach pains, can take rytary  . Oxycodone-Acetaminophen Other (See Comments)    "boils on skin"  . Tylenol [Acetaminophen]   . Tyloxapol   . Pramipexole Nausea Only  :   His Current Medications Are:  Outpatient Encounter Medications as of 04/18/2019  Medication Sig  . aspirin 81 MG tablet Take 81 mg by mouth daily.  Marland Kitchen BREO ELLIPTA 100-25 MCG/INH AEPB USE 1 INHALATION ORALLY    DAILY  . Calcium Citrate 250 MG TABS Take 1 tablet by mouth daily.   . Carbidopa-Levodopa ER (RYTARY) 48.75-195 MG CPCR Take 195 mg by mouth 5 (five) times daily.  . cetirizine (ZYRTEC) 10 MG tablet Take 10 mg by mouth daily.  . finasteride (PROSCAR) 5 MG tablet Take 5 mg by mouth daily.  . fluticasone (FLONASE) 50 MCG/ACT nasal spray Place into both nostrils daily.  Marland Kitchen ipratropium (ATROVENT) 0.03 % nasal spray Place 0.03 mLs into both nostrils 3 (three) times daily as needed.  . niacin 500 MG tablet Take 500 mg by mouth every morning.   . olmesartan (BENICAR) 20 MG tablet Take 20 mg by mouth daily.  Marland Kitchen omeprazole (PRILOSEC) 40  MG capsule Take 40 mg by mouth daily.  Marland Kitchen PROAIR HFA 108 (90 Base) MCG/ACT inhaler INHALE 2 PUFFS INTO THE LUNGS AS NEEDED.  . Simethicone (GAS RELIEF PO) Take 1 Dose by mouth as needed.   . simvastatin (ZOCOR) 10 MG tablet Take 10 mg by mouth daily.  . tamsulosin (FLOMAX) 0.4 MG CAPS capsule Take 0.4 mg by mouth daily.   . vitamin C (ASCORBIC ACID) 500 MG tablet Take  500 mg by mouth daily.   No facility-administered encounter medications on file as of 04/18/2019.   :  Review of Systems:  Out of a complete 14 point review of systems, all are reviewed and negative with the exception of these symptoms as listed below:  Review of Systems  Neurological:       Pt presents today for follow up. He reports that he has a tumor/cyst that was recently removed and it could have been cancerous. He is complaining of leg swelling.    Objective:  Neurological Exam  Physical Exam Physical Examination:   Vitals:   04/18/19 1033  BP: 108/74  Pulse: 85    General Examination: The patient is a very pleasant 85 y.o. male in no acute distress. He appears well-developed and well-nourished and well groomed.   HEENT:Normocephalic, atraumatic, pupils are equal, round and reactive to light and accommodation. He has corrective eyeglasses. He has mild saccadic breakdown onsmooth pursuit testing, no nystagmus is noted. He had cataract repair on the left,mild decrease in eye blink rate and mild to moderate facial masking. He has no significant drooling, oropharynx examination reveals mild mouth dryness, tongue protrudes centrally and palate elevates symmetrically, mild to moderate hypophonia noted, perhaps slight dysarthria.  Chest:Clear to auscultation without wheezing, rhonchi or crackles noted.  Heart:S1+S2+0, regular and normal without murmurs, rubs or gallops noted.   Abdomen:Soft, non-tender and non-distended with normal bowel sounds appreciated on auscultation.  Extremities:There is2+edema in the distal lower extremities bilaterally,right more than left.   Skin: Warm and dry without trophic changes noted.  Musculoskeletal: exam reveals Right arm in a sling.  Neurologically:  Mental status: The patient is awake, alert and oriented in all 4 spheres. Hisimmediate and remote memory, attention, language skills and fund of knowledge are fairly  appropriate. There is mild slowness in thinking. Thought process is linear. Mood is normaland affect is normal.  Cranial nerves II - XII are as described above under HEENT exam. In addition:Motor exam: Normal bulk, and strength for age. Right upper extremity difficult to assess, lower extremity swelling bilaterally makes it difficult to assess strength as well.  He has a mildly increased tone on the left side, worse compared to before, he had a mild to moderate resting tremor in the left upper extremity, no obvious resting tremor noted otherwise.  Romberg is not testable due to safety reasons. Fine motor testing: he is moderately impaired on the left and mildto moderately impaired on the right,mostly stable.  Cerebellar testing: No dysmetria or intention tremor. There is no truncal or gait ataxia.  Sensory exam: intact to light touch in the upper and lower extremities.  Gait, station and balance: [place] with Difficulty and uses his left hand to push up, he requires no assistance, posture is mild to moderately stooped, slightly worse, balance is mildly impaired, probably also slightly worse, he walks with decreased arm swing on the left. Assessment and Plan:   In summary, Adam Moss a very pleasant 85 year old malewith anunderlying medical history of degenerative spine disease, allergies,  BPH, arthritis, reflux disease, and hypertension, who presents for follow-up consultation of his left-sided predominant Parkinson's disease, complicated by significant medication intolerancesin the past, mostly secondary to GI related symptoms including stomach pain, exacerbation of reflux and nausea and vomiting. He was unable to tolerate Mirapex and Sinemetin the past.Fortunately, he has been able to tolerate Rytarybrand-name, which was started at 95 mg strength, up to 2 pills 3 times a day and then we switched to 195 mg strength, 1 pill3 times a day, then 4 times a day, with improvement in symptoms  including tremor and fine motor skills. I suggested that he increase the Rytary to 1 pill 5 times a day, about 5 hours apart. Unfortunately, he has had interim complications and has been found to have a pathologic right clavicle fracture and subsequent testing also revealed other spots in the bones, concerning for lytic lesions.  He has a follow-up appointment pending soon with his oncologist.  He has noticed an increase in his left hand tremor and some fluctuating symptoms.  He has not been hydrating very well, he has not been sleeping very well.  Unfortunately, this is a setback for him altogether and stressful times of course.  He has been able to get some more help at the house.  His family is involved and he has good support.  He is advised to continue with his Rytary At the current dose.  He is advised to stay well-hydrated and try melatonin at night for sleep We talked about the importance of constipation prevention and control. Of note, he had a DaT scan in August 2016 which was supportive of Parkinson's disease with decreased radiotracer uptake on the right.From my end of things, I suggested a six-month follow-up. I answered all their questions today and The patient and his daughter-in-law were in agreementhe was in agreement. I spent 30 minutes in total face-to-face time with the patient, more than 50% of which was spent in counseling and coordination of care, reviewing test results, reviewing medication and discussing or reviewing the diagnosis of PD, its prognosis and treatment options. Pertinent laboratory and imaging test results that were available during this visit with the patient were reviewed by me and considered in my medical decision making (see chart for details).

## 2019-04-18 NOTE — Patient Instructions (Signed)
Your walking is a little worse and posture, also your tremor on the left side is more noticeable.  Nevertheless, I would not recommend a change in your Parkinson's regimen at this point because you are going through a stressful time and your symptoms have been fluctuating.  It is all the more important that you try to sleep enough and rest well at night and that you hydrate well with water.  Please try melatonin again for sleep:  Take 1 mg to 3 mg, one to 2 hours before your bedtime. You can go up to 5 mg if needed. It is over the counter and comes in pill form, chewable form and spray, if you prefer. We will monitor your memory. Please try to drink about 6 or even 8 cups of water per day if possible.  Try to continue to stay active within your limitations, try to walk on a regular basis. It is good that you have been able to have more help.  Please be really proactive about constipation, Use a fiber supplement, stool softener and even laxative if needed. I will see you back in 6 months, sooner if needed.

## 2019-04-19 DIAGNOSIS — G20 Parkinson's disease: Secondary | ICD-10-CM | POA: Diagnosis not present

## 2019-04-19 DIAGNOSIS — S42001S Fracture of unspecified part of right clavicle, sequela: Secondary | ICD-10-CM | POA: Diagnosis not present

## 2019-04-19 DIAGNOSIS — M25511 Pain in right shoulder: Secondary | ICD-10-CM | POA: Diagnosis not present

## 2019-04-19 DIAGNOSIS — S42024D Nondisplaced fracture of shaft of right clavicle, subsequent encounter for fracture with routine healing: Secondary | ICD-10-CM | POA: Diagnosis not present

## 2019-04-19 DIAGNOSIS — M6281 Muscle weakness (generalized): Secondary | ICD-10-CM | POA: Diagnosis not present

## 2019-04-19 DIAGNOSIS — R2689 Other abnormalities of gait and mobility: Secondary | ICD-10-CM | POA: Diagnosis not present

## 2019-04-20 ENCOUNTER — Inpatient Hospital Stay (HOSPITAL_BASED_OUTPATIENT_CLINIC_OR_DEPARTMENT_OTHER): Payer: Medicare Other | Admitting: Oncology

## 2019-04-20 DIAGNOSIS — M8448XA Pathological fracture, other site, initial encounter for fracture: Secondary | ICD-10-CM

## 2019-04-23 ENCOUNTER — Telehealth: Payer: Self-pay

## 2019-04-23 DIAGNOSIS — J449 Chronic obstructive pulmonary disease, unspecified: Secondary | ICD-10-CM | POA: Diagnosis not present

## 2019-04-23 DIAGNOSIS — I890 Lymphedema, not elsewhere classified: Secondary | ICD-10-CM | POA: Diagnosis not present

## 2019-04-23 DIAGNOSIS — C9000 Multiple myeloma not having achieved remission: Secondary | ICD-10-CM | POA: Diagnosis not present

## 2019-04-23 DIAGNOSIS — E782 Mixed hyperlipidemia: Secondary | ICD-10-CM | POA: Diagnosis not present

## 2019-04-23 DIAGNOSIS — G20 Parkinson's disease: Secondary | ICD-10-CM | POA: Diagnosis not present

## 2019-04-23 DIAGNOSIS — C44599 Other specified malignant neoplasm of skin of other part of trunk: Secondary | ICD-10-CM | POA: Diagnosis not present

## 2019-04-23 DIAGNOSIS — I10 Essential (primary) hypertension: Secondary | ICD-10-CM | POA: Diagnosis not present

## 2019-04-23 NOTE — Telephone Encounter (Signed)
Called patient and had to leave him a detailed message with his appointment to [facility] at [address], [place], [place] [address]. His appointment is on Friday 04/27/2019 at 8:50 AM. Their phone number in case patient would like to reschedule is [phone].  Patient will need to go to the Radiologist office to pick up his CT Scan results to take to his appointment. This was informed in the detailed message I left him. I will send him a letter as well.

## 2019-04-23 NOTE — Telephone Encounter (Signed)
Called patient and had to leave him a detailed voicemail with his bone marrow biopsy appointment on 05/01/2019 at 7:30 AM at the [facility].

## 2019-04-24 DIAGNOSIS — M6281 Muscle weakness (generalized): Secondary | ICD-10-CM | POA: Diagnosis not present

## 2019-04-24 DIAGNOSIS — S42024D Nondisplaced fracture of shaft of right clavicle, subsequent encounter for fracture with routine healing: Secondary | ICD-10-CM | POA: Diagnosis not present

## 2019-04-24 DIAGNOSIS — M25511 Pain in right shoulder: Secondary | ICD-10-CM | POA: Diagnosis not present

## 2019-04-24 DIAGNOSIS — C9000 Multiple myeloma not having achieved remission: Secondary | ICD-10-CM | POA: Insufficient documentation

## 2019-04-24 DIAGNOSIS — E782 Mixed hyperlipidemia: Secondary | ICD-10-CM | POA: Insufficient documentation

## 2019-04-24 DIAGNOSIS — J449 Chronic obstructive pulmonary disease, unspecified: Secondary | ICD-10-CM | POA: Insufficient documentation

## 2019-04-24 DIAGNOSIS — R2689 Other abnormalities of gait and mobility: Secondary | ICD-10-CM | POA: Diagnosis not present

## 2019-04-24 DIAGNOSIS — G20 Parkinson's disease: Secondary | ICD-10-CM | POA: Diagnosis not present

## 2019-04-24 DIAGNOSIS — S42001S Fracture of unspecified part of right clavicle, sequela: Secondary | ICD-10-CM | POA: Diagnosis not present

## 2019-04-24 DIAGNOSIS — I890 Lymphedema, not elsewhere classified: Secondary | ICD-10-CM | POA: Insufficient documentation

## 2019-04-24 DIAGNOSIS — R609 Edema, unspecified: Secondary | ICD-10-CM | POA: Insufficient documentation

## 2019-04-24 DIAGNOSIS — I10 Essential (primary) hypertension: Secondary | ICD-10-CM | POA: Insufficient documentation

## 2019-04-27 NOTE — Progress Notes (Signed)
Patient for BMB on 05/01/2019. Spoke with Wendy/daughter who will be bringing patient for procedure. Time to be here @ 0730 for procedure, instructions given with questions answered.

## 2019-04-30 ENCOUNTER — Other Ambulatory Visit: Payer: Self-pay | Admitting: Radiology

## 2019-05-01 ENCOUNTER — Other Ambulatory Visit: Payer: Self-pay

## 2019-05-01 ENCOUNTER — Ambulatory Visit
Admission: RE | Admit: 2019-05-01 | Discharge: 2019-05-01 | Disposition: A | Discharge status: Home / Self Care | Payer: Medicare Other | Source: Ambulatory Visit | Attending: Oncology | Admitting: Oncology

## 2019-05-01 DIAGNOSIS — M899 Disorder of bone, unspecified: Secondary | ICD-10-CM | POA: Diagnosis not present

## 2019-05-01 DIAGNOSIS — M84419A Pathological fracture, unspecified shoulder, initial encounter for fracture: Secondary | ICD-10-CM | POA: Insufficient documentation

## 2019-05-01 DIAGNOSIS — C9000 Multiple myeloma not having achieved remission: Secondary | ICD-10-CM | POA: Diagnosis not present

## 2019-05-01 DIAGNOSIS — D649 Anemia, unspecified: Secondary | ICD-10-CM | POA: Diagnosis not present

## 2019-05-01 DIAGNOSIS — M8448XA Pathological fracture, other site, initial encounter for fracture: Secondary | ICD-10-CM

## 2019-05-01 LAB — CBC WITH DIFFERENTIAL/PLATELET
Abs Immature Granulocytes: 0.06 10*3/uL (ref 0.00–0.07)
Basophils Absolute: 0 10*3/uL (ref 0.0–0.1)
Basophils Relative: 0 %
Eosinophils Absolute: 0.2 10*3/uL (ref 0.0–0.5)
Eosinophils Relative: 4 %
HCT: 35.6 % — ABNORMAL LOW (ref 39.0–52.0)
Hemoglobin: 11.6 g/dL — ABNORMAL LOW (ref 13.0–17.0)
Immature Granulocytes: 1 %
Lymphocytes Relative: 37 %
Lymphs Abs: 1.7 10*3/uL (ref 0.7–4.0)
MCH: 29.5 pg (ref 26.0–34.0)
MCHC: 32.6 g/dL (ref 30.0–36.0)
MCV: 90.6 fL (ref 80.0–100.0)
Monocytes Absolute: 0.6 10*3/uL (ref 0.1–1.0)
Monocytes Relative: 12 %
Neutro Abs: 2.2 10*3/uL (ref 1.7–7.7)
Neutrophils Relative %: 46 %
Platelets: 153 10*3/uL (ref 150–400)
RBC: 3.93 MIL/uL — ABNORMAL LOW (ref 4.22–5.81)
RDW: 16.8 % — ABNORMAL HIGH (ref 11.5–15.5)
WBC: 4.7 10*3/uL (ref 4.0–10.5)
nRBC: 0 % (ref 0.0–0.2)

## 2019-05-01 LAB — PROTIME-INR
INR: 1 (ref 0.8–1.2)
Prothrombin Time: 13.1 seconds (ref 11.4–15.2)

## 2019-05-01 MED ORDER — FENTANYL CITRATE (PF) 100 MCG/2ML IJ SOLN
INTRAMUSCULAR | Status: AC | PRN
  Administered 2019-05-01 (×2): 25 ug via INTRAVENOUS

## 2019-05-01 MED ORDER — BUPIVACAINE HCL (PF) 0.25 % IJ SOLN
INTRAMUSCULAR | Status: AC
  Administered 2019-05-01: 6 mL
  Filled 2019-05-01: qty 30

## 2019-05-01 MED ORDER — SODIUM CHLORIDE 0.9 % IV SOLN
INTRAVENOUS | Status: DC
  Administered 2019-05-01: 1000 mL via INTRAVENOUS

## 2019-05-01 MED ORDER — HEPARIN SOD (PORK) LOCK FLUSH 100 UNIT/ML IV SOLN
INTRAVENOUS | Status: AC
  Filled 2019-05-01: qty 5

## 2019-05-01 MED ORDER — FENTANYL CITRATE (PF) 100 MCG/2ML IJ SOLN
INTRAMUSCULAR | Status: AC
  Filled 2019-05-01: qty 2

## 2019-05-01 MED ORDER — MIDAZOLAM HCL 5 MG/5ML IJ SOLN
INTRAMUSCULAR | Status: AC
  Filled 2019-05-01: qty 5

## 2019-05-01 MED ORDER — MIDAZOLAM HCL 5 MG/5ML IJ SOLN
INTRAMUSCULAR | Status: AC | PRN
  Administered 2019-05-01: 0.5 mg via INTRAVENOUS

## 2019-05-03 DIAGNOSIS — R2689 Other abnormalities of gait and mobility: Secondary | ICD-10-CM | POA: Diagnosis not present

## 2019-05-03 DIAGNOSIS — M25511 Pain in right shoulder: Secondary | ICD-10-CM | POA: Diagnosis not present

## 2019-05-03 DIAGNOSIS — R279 Unspecified lack of coordination: Secondary | ICD-10-CM | POA: Diagnosis not present

## 2019-05-03 DIAGNOSIS — S42001S Fracture of unspecified part of right clavicle, sequela: Secondary | ICD-10-CM | POA: Diagnosis not present

## 2019-05-03 DIAGNOSIS — R41841 Cognitive communication deficit: Secondary | ICD-10-CM | POA: Diagnosis not present

## 2019-05-03 DIAGNOSIS — G20 Parkinson's disease: Secondary | ICD-10-CM | POA: Diagnosis not present

## 2019-05-03 DIAGNOSIS — S42024D Nondisplaced fracture of shaft of right clavicle, subsequent encounter for fracture with routine healing: Secondary | ICD-10-CM | POA: Diagnosis not present

## 2019-05-03 DIAGNOSIS — M6281 Muscle weakness (generalized): Secondary | ICD-10-CM | POA: Diagnosis not present

## 2019-05-03 LAB — SURGICAL PATHOLOGY

## 2019-05-07 ENCOUNTER — Other Ambulatory Visit: Payer: Self-pay

## 2019-05-07 ENCOUNTER — Encounter (INDEPENDENT_AMBULATORY_CARE_PROVIDER_SITE_OTHER): Payer: Self-pay | Admitting: Vascular Surgery

## 2019-05-07 ENCOUNTER — Ambulatory Visit (INDEPENDENT_AMBULATORY_CARE_PROVIDER_SITE_OTHER): Payer: Medicare Other | Admitting: Vascular Surgery

## 2019-05-07 VITALS — BP 116/76 | HR 99 | Resp 16 | Wt 194.8 lb

## 2019-05-07 DIAGNOSIS — C9000 Multiple myeloma not having achieved remission: Secondary | ICD-10-CM | POA: Insufficient documentation

## 2019-05-07 DIAGNOSIS — E782 Mixed hyperlipidemia: Secondary | ICD-10-CM

## 2019-05-07 DIAGNOSIS — J439 Emphysema, unspecified: Secondary | ICD-10-CM | POA: Diagnosis not present

## 2019-05-07 DIAGNOSIS — R600 Localized edema: Secondary | ICD-10-CM

## 2019-05-07 DIAGNOSIS — J449 Chronic obstructive pulmonary disease, unspecified: Secondary | ICD-10-CM | POA: Insufficient documentation

## 2019-05-07 DIAGNOSIS — E785 Hyperlipidemia, unspecified: Secondary | ICD-10-CM | POA: Insufficient documentation

## 2019-05-07 NOTE — Progress Notes (Signed)
[place]  [phone] [phone]  ID: Adam Moss OB: 07-05-1934  MR#: [id number]  GUY#:[id number]  Patient Care Team: Perrin Maltese, MD as PCP - General (Internal Medicine)   I connected with Adam Moss on 05/12/19 at 11:15 AM EDT by video enabled telemedicine visit and verified that I am speaking with the correct person using two identifiers.   I discussed the limitations, risks, security and privacy concerns of performing an evaluation and management service by telemedicine and the availability of in-person appointments. I also discussed with the patient that there may be a patient responsible charge related to this service. The patient expressed understanding and agreed to proceed.   Other persons participating in the visit and their role in the encounter: Patient, patient's daughter, MD  Patients location: Home Providers location: Clinic   CHIEF COMPLAINT: Kappa light chain myeloma.   INTERVAL HISTORY: Patient agreed to video enabled telemedicine visit to discuss his bone marrow biopsy results and treatment planning.  He continues to have mild pain at the site of his pathologic fracture in his right clavicle, but otherwise feels well. He has a resting tremor secondary to his Parkinson's, but no other neurologic complaints.  He denies any recent fevers or illnesses.  He has good appetite and denies weight loss.  He has no chest pain, shortness of breath, cough, or hemoptysis.  He denies any nausea, vomiting, constipation, or diarrhea.  He has no urinary complaints.  Patient offers no further specific complaints today.  REVIEW OF SYSTEMS:   Review of Systems  Constitutional: Negative.  Negative for fever, malaise/fatigue and weight loss.  Respiratory: Negative.  Negative for cough, hemoptysis and shortness of breath.   Cardiovascular: Negative.  Negative for chest pain and leg swelling.  Gastrointestinal: Negative.  Negative for  abdominal pain.  Genitourinary: Negative.  Negative for dysuria.  Musculoskeletal: Negative.  Negative for back pain.  Skin: Negative.  Negative for rash.  Neurological: Positive for tremors. Negative for dizziness, focal weakness, weakness and headaches.  Psychiatric/Behavioral: Negative.  The patient is not nervous/anxious.     As per HPI. Otherwise, a complete review of systems is negative.  PAST MEDICAL HISTORY: Past Medical History:  Diagnosis Date   Cancer ([place])    skin   COPD (chronic obstructive pulmonary disease) ([place])    Hyperlipemia    Hypertension    Lymphedema of both lower extremities    Parkinson disease ([place])    Parkinsonism ([place]) 02/21/2015   Spinal stenosis    Tremor     PAST SURGICAL HISTORY: Past Surgical History:  Procedure Laterality Date   APPENDECTOMY     CATARACT EXTRACTION     CHOLECYSTECTOMY     TONSILLECTOMY     VASECTOMY      FAMILY HISTORY: Family History  Problem Relation Age of Onset   Cancer Mother    Heart disease Father     ADVANCED DIRECTIVES (Y/N):  N  HEALTH MAINTENANCE: Social History   Tobacco Use   Smoking status: Never Smoker   Smokeless tobacco: Never Used  Substance Use Topics   Alcohol use: No    Alcohol/week: 0.0 standard drinks   Drug use: No     Colonoscopy:  PAP:  Bone density:  Lipid panel:  Allergies  Allergen Reactions   Carbidopa-Levodopa Other (See Comments)    Severe stomach pains, can take rytary   Oxycodone-Acetaminophen Other (See Comments)    "boils on skin"   Tylenol [Acetaminophen]    Tyloxapol  Pramipexole Nausea Only    Current Outpatient Medications  Medication Sig Dispense Refill   aspirin 81 MG tablet Take 81 mg by mouth daily.     BREO ELLIPTA 100-25 MCG/INH AEPB USE 1 INHALATION ORALLY    DAILY 60 each 1   Calcium Citrate 250 MG TABS Take 1 tablet by mouth daily.      Carbidopa-Levodopa ER (RYTARY) 48.75-195 MG CPCR Take 195 mg by mouth 5  (five) times daily. 450 capsule 3   cetirizine (ZYRTEC) 10 MG tablet Take 10 mg by mouth daily.     finasteride (PROSCAR) 5 MG tablet Take 5 mg by mouth daily.     fluticasone (FLONASE) 50 MCG/ACT nasal spray Place into both nostrils daily.     ipratropium (ATROVENT) 0.03 % nasal spray Place 0.03 mLs into both nostrils 3 (three) times daily as needed.  11   lenalidomide (REVLIMID) 10 MG capsule Take 1 capsule (10 mg total) by mouth daily. 28 capsule 5   niacin 500 MG tablet Take 500 mg by mouth every morning.      olmesartan (BENICAR) 20 MG tablet Take 20 mg by mouth daily.     omeprazole (PRILOSEC) 40 MG capsule Take 40 mg by mouth daily.     PROAIR HFA 108 (90 Base) MCG/ACT inhaler INHALE 2 PUFFS INTO THE LUNGS AS NEEDED. 17 Inhaler 3   Simethicone (GAS RELIEF PO) Take 1 Dose by mouth as needed.      simvastatin (ZOCOR) 10 MG tablet Take 10 mg by mouth daily.     tamsulosin (FLOMAX) 0.4 MG CAPS capsule Take 0.4 mg by mouth daily.      vitamin C (ASCORBIC ACID) 500 MG tablet Take 500 mg by mouth daily.     No current facility-administered medications for this visit.     OBJECTIVE: There were no vitals filed for this visit.   There is no height or weight on file to calculate BMI.    ECOG FS:1 - Symptomatic but completely ambulatory  General: Well-developed, well-nourished, no acute distress. HEENT: Normocephalic. Neuro: Alert, answering all questions appropriately. Cranial nerves grossly intact. Psych: Normal affect.  LAB RESULTS:  Lab Results  Component Value Date   NA 136 04/04/2019   K 4.0 04/04/2019   CL 102 04/04/2019   CO2 24 04/04/2019   GLUCOSE 138 (H) 04/04/2019   BUN 19 04/04/2019   CREATININE 0.83 04/04/2019   CALCIUM 8.9 04/04/2019   PROT 5.9 (L) 04/04/2019   ALBUMIN 3.6 04/04/2019   AST 22 04/04/2019   ALT 13 04/04/2019   ALKPHOS 60 04/04/2019   BILITOT 0.5 04/04/2019   GFRNONAA >60 04/04/2019   GFRAA >60 04/04/2019    Lab Results  Component  Value Date   WBC 4.7 05/01/2019   NEUTROABS 2.2 05/01/2019   HGB 11.6 (L) 05/01/2019   HCT 35.6 (L) 05/01/2019   MCV 90.6 05/01/2019   PLT 153 05/01/2019     STUDIES: Ct Abdomen Pelvis W Contrast  Result Date: 04/17/2019 CLINICAL DATA:  Pathologic right clavicle fracture. Assess for primary malignancy. EXAM: CT ABDOMEN AND PELVIS WITH CONTRAST TECHNIQUE: Multidetector CT imaging of the abdomen and pelvis was performed using the standard protocol following bolus administration of intravenous contrast. CONTRAST:  167m OMNIPAQUE IOHEXOL 300 MG/ML  SOLN COMPARISON:  Chest CT 04/01/2019. Whole-body bone scan 04/09/2019. Report only from abdominopelvic CT 01/05/2009. FINDINGS: Lower chest: Mild emphysematous changes at both lung bases. No significant pleural or pericardial effusion. Hepatobiliary: There is an 8 mm  low-density lesion in the left hepatic lobe on image 22/2, too small to characterize. No other focal hepatic abnormalities are identified. There is no significant biliary dilatation post cholecystectomy. Pancreas: Unremarkable. No pancreatic ductal dilatation or surrounding inflammatory changes. Spleen: Normal in size without focal abnormality. Adrenals/Urinary Tract: Both adrenal glands appear normal. No suspicious renal findings. No evidence of urinary tract calculus or hydronephrosis. The bladder appears normal. Stomach/Bowel: No evidence of bowel wall thickening, distention or surrounding inflammatory change. There are extensive diverticular changes throughout the colon, greatest distally. Vascular/Lymphatic: There are no enlarged abdominal or pelvic lymph nodes. Mild aortic and branch vessel atherosclerosis. Reproductive: Mild enlargement of the prostate gland. The seminal vesicles appear normal. Other: Tiny umbilical hernia containing only fat. No ascites or peritoneal nodularity. Musculoskeletal: There are multiple lytic osseous lesions, some associated with soft tissue masses. The most  prominent lesions involve the left 7th rib near the costovertebral junction (image 4/2), the T8 vertebral body (image 8/2), the L2 vertebral body, measuring 2.3 cm on image 43/2, the left 8th rib laterally measuring 3.1 cm on image 19/2, and the inferior aspect of the right sacrum, measuring up to 2.8 cm on image 71/2. This latter finding is associated with a soft tissue mass which extends into the sciatic notch. There are several other smaller lesions. No pathologic fractures are identified. There is a peripherally enhancing fluid collection posterior to the right femoral greater trochanter without associated soft tissue mass, likely trochanteric bursitis. IMPRESSION: 1. Multiple lytic osseous lesions, mostly occult on recent whole-body bone scan, favoring multiple myeloma over metastatic disease. 2. No primary malignancy or definite evidence of non osseous metastatic disease in the abdomen or pelvis. There is a small indeterminate low-density lesion in the left hepatic lobe. 3. Diffuse colonic diverticulosis. 4.  Aortic Atherosclerosis (ICD10-I70.0). Electronically Signed   By: Richardean Sale M.D.   On: 04/17/2019 13:08   Ct Bone Marrow Biopsy & Aspiration  Result Date: 05/01/2019 INDICATION: None pathologic fracture of the clavicle with multiple lytic bone lesions on recent CT EXAM: CT-GUIDED BONE MARROW ASPIRATION AND BIOPSY. MEDICATIONS: None. ANESTHESIA/SEDATION: Moderate (conscious) sedation was employed during this procedure. A total of Versed 0.5 mg and Fentanyl 50 mcg was administered intravenously. Moderate Sedation Time: 21 minutes. The patient's level of consciousness and vital signs were monitored continuously by radiology nursing throughout the procedure under my direct supervision. FLUOROSCOPY TIME:  Not applicable COMPLICATIONS: None immediate. PROCEDURE: Informed written consent was obtained from the patient after a thorough discussion of the procedural risks, benefits and alternatives. All  questions were addressed. Maximal Sterile Barrier Technique was utilized including caps, mask, sterile gowns, sterile gloves, sterile drape, hand hygiene and skin antiseptic. A timeout was performed prior to the initiation of the procedure. Utilizing 0.25% Marcaine as a local and deep periosteal anesthetic, an Arrow On Control bone biopsy needle was placed into the right iliac bone. Initial aspirates were obtained and sent for pathologic evaluation. These were deemed adequate. Subsequently a single core of bone was obtained and sent for pathologic evaluation. This was also deemed adequate. Needle was then removed. Hemostasis was obtained at the puncture site. The patient tolerated the procedure well was returned his room in satisfactory condition. IMPRESSION: Successful CT-guided bone marrow biopsy Electronically Signed   By: Inez Catalina M.D.   On: 05/01/2019 10:11    ASSESSMENT: Kappa light chain myeloma.  PLAN:    1. Kappa light chain myeloma: Bone marrow biopsy on May 01, 2019 revealed increased plasma cell of  30 to 40% with kappa light chain restriction.  SPEP is negative and IgG, IgA, and IgM are essentially within normal limits.  Patient has a significantly elevated kappa free light chain of 722.7 as well as an elevated kappa/lambda light chain ratio of 176.27.  He only has a mild anemia and his calcium and creatinine are within normal limits.  He has evidence of endorgan damage from his right clavicle pathologic fracture. Although nuclear med bone scan was unrevealing, CT of the abdomen pelvis did reveal multiple osseous lytic lesions highly suspicious for underlying multiple myeloma.  Patient wishes to pursue treatment, although given his underlying Parkinson's and advanced age does not wish to pursue fully aggressive chemotherapy.  Because of this, will initiate 10 mg Revlimid daily.  Patient will also receive Zometa every 4 weeks.  Return to clinic in 2 to 3 weeks to assess his toleration of  Revlimid and initiation of Zometa.   2.  Right clavicle fracture: Pathologic.  Patient was given a referral back to [facility] for further evaluation.    I provided 25 minutes of face-to-face video visit time during this encounter, and > 50% was spent counseling as documented under my assessment & plan.   Patient expressed understanding and was in agreement with this plan. He also understands that He can call clinic at any time with any questions, concerns, or complaints.    Lloyd Huger, MD   05/12/2019 7:42 PM

## 2019-05-07 NOTE — Progress Notes (Signed)
MRN : [id number]  Adam Moss is a 85 y.o. (1934/02/23) male who presents with chief complaint of No chief complaint on file. Marland Kitchen  History of Present Illness:   Patient is seen for evaluation of leg pain and leg swelling. The patient first noticed the swelling remotely. The swelling is associated with pain and discoloration. The pain and swelling worsens with prolonged dependency and improves with elevation. The pain is unrelated to activity.  The patient notes that in the morning the legs are significantly improved but they steadily worsened throughout the course of the day. The patient also notes a steady worsening of the discoloration in the ankle and shin area.   The patient denies claudication symptoms.  The patient denies symptoms consistent with rest pain.  The patient denies and extensive history of DJD and LS spine disease.  The patient has no had any past angiography, interventions or vascular surgery.  Elevation makes the leg symptoms better, dependency makes them much worse. There is no history of ulcerations. The patient denies any recent changes in medications.  The patient has been wearing graduated compression intermittently and he already has a lymph pump.  The patient denies a history of DVT or PE. There is no prior history of phlebitis. There is no history of primary lymphedema.  No history of malignancies. No history of trauma or groin or pelvic surgery. There is no history of radiation treatment to the groin or pelvis  The patient denies amaurosis fugax or recent TIA symptoms. There are no recent neurological changes noted. The patient denies recent episodes of angina or shortness of breath  No outpatient medications have been marked as taking for the 05/07/19 encounter (Appointment) with Delana Meyer, Dolores Lory, MD.    Past Medical History:  Diagnosis Date  . Cancer ([place])    skin  . COPD (chronic obstructive pulmonary disease) ([place])   . Hyperlipemia   .  Hypertension   . Lymphedema of both lower extremities   . Parkinson disease ([place])   . Parkinsonism ([place]) 02/21/2015  . Spinal stenosis   . Tremor     Past Surgical History:  Procedure Laterality Date  . APPENDECTOMY    . CATARACT EXTRACTION    . CHOLECYSTECTOMY    . TONSILLECTOMY    . VASECTOMY      Social History Social History   Tobacco Use  . Smoking status: Never Smoker  . Smokeless tobacco: Never Used  Substance Use Topics  . Alcohol use: No    Alcohol/week: 0.0 standard drinks  . Drug use: No    Family History Family History  Problem Relation Age of Onset  . Cancer Mother   . Heart disease Father   No family history of bleeding/clotting disorders, porphyria or autoimmune disease   Allergies  Allergen Reactions  . Carbidopa-Levodopa Other (See Comments)    Severe stomach pains, can take rytary  . Oxycodone-Acetaminophen Other (See Comments)    "boils on skin"  . Tylenol [Acetaminophen]   . Tyloxapol   . Pramipexole Nausea Only     REVIEW OF SYSTEMS (Negative unless checked)  Constitutional: _0 Weight loss  _1 Fever  _2 Chills Cardiac: _3 Chest pain   _4 Chest pressure   _5 Palpitations   _6 Shortness of breath when laying flat   _7 Shortness of breath with exertion. Vascular:  _8 Pain in legs with walking   _9 Pain in legs at rest  _10 History of DVT   _11 Phlebitis   _12 Swelling in legs   _13 Varicose veins   _14 Non-healing ulcers Pulmonary:   _15   Uses home oxygen   _0 Productive cough   _1 Hemoptysis   _2 Wheeze  _3 COPD   _4 Asthma Neurologic:  _5 Dizziness   _6 Seizures   _7 History of stroke   _8 History of TIA  _9 Aphasia   _10 Vissual changes   _11 Weakness or numbness in arm   _12 Weakness or numbness in leg Musculoskeletal:   _13 Joint swelling   _14 Joint pain   _15 Low back pain Hematologic:  _16 Easy bruising  _17 Easy bleeding   _18 Hypercoagulable state   _19 Anemic Gastrointestinal:  _20 Diarrhea   _21 Vomiting  _22 Gastroesophageal reflux/heartburn   _23 Difficulty swallowing. Genitourinary:   _24 Chronic kidney disease   _25 Difficult urination  _26 Frequent urination   _27 Blood in urine Skin:  _28 Rashes   _29 Ulcers  Psychological:  _30 History of anxiety   _31  History of major depression.  Physical Examination  There were no vitals filed for this visit. There is no height or weight on file to calculate BMI. Gen: WD/WN, NAD Head: [place]/AT, No temporalis wasting.  Ear/Nose/Throat: Hearing grossly intact, nares w/o erythema or drainage, poor dentition Eyes: PER, EOMI, sclera nonicteric.  Neck: Supple, no masses.  No bruit or JVD.  Pulmonary:  Good air movement, clear to auscultation bilaterally, no use of accessory muscles.  Cardiac: RRR, normal S1, S2, no Murmurs. Vascular:scattered varicosities present bilaterally.  Moderate venous stasis changes to the legs bilaterally.  3+ soft pitting edema. Gastrointestinal: soft, non-distended. No guarding/no peritoneal signs.  Musculoskeletal: M/S 5/5 throughout.  No deformity or atrophy.  Neurologic: CN 2-12 intact. Pain and light touch intact in extremities.  Symmetrical.  Speech is fluent. Motor exam as listed above. Psychiatric: Judgment intact, Mood & affect appropriate for pt's clinical situation. Dermatologic: No rashes or ulcers noted.  No changes consistent with cellulitis. Lymph : No Cervical lymphadenopathy, no lichenification or skin changes of chronic lymphedema.  CBC Lab Results  Component Value Date   WBC 4.7 05/01/2019   HGB 11.6 (L) 05/01/2019   HCT 35.6 (L) 05/01/2019   MCV 90.6 05/01/2019   PLT 153 05/01/2019    BMET    Component Value Date/Time   NA 136 04/04/2019 1216   NA 139 10/27/2014 0017   K 4.0 04/04/2019 1216   K 3.5 10/27/2014 0017   CL 102 04/04/2019 1216   CL 103 10/27/2014 0017   CO2 24 04/04/2019 1216   CO2 28 10/27/2014 0017   GLUCOSE 138 (H) 04/04/2019 1216   GLUCOSE 111 (H) 10/27/2014 0017   BUN 19 04/04/2019 1216   BUN 13 10/27/2014 0017   CREATININE 0.83 04/04/2019 1216   CREATININE 0.96  10/27/2014 0017   CALCIUM 8.9 04/04/2019 1216   CALCIUM 9.2 10/27/2014 0017   GFRNONAA >60 04/04/2019 1216   GFRNONAA >60 10/27/2014 0017   GFRAA >60 04/04/2019 1216   GFRAA >60 10/27/2014 0017   CrCl cannot be calculated (Patient's most recent lab result is older than the maximum 21 days allowed.).  COAG Lab Results  Component Value Date   INR 1.0 05/01/2019    Radiology Nm Bone Scan Whole Body  Result Date: 04/10/2019 CLINICAL DATA:  Pathologic right clavicle fracture. History of nonmelanoma skin cancer. EXAM: NUCLEAR MEDICINE WHOLE BODY BONE SCAN TECHNIQUE: Whole body anterior and posterior images were obtained approximately 3 hours after intravenous injection of radiopharmaceutical. RADIOPHARMACEUTICALS:  21.8 mCi Technetium-23mMDP IV COMPARISON:  CT chest 04/11/2019. FINDINGS: There is focally increased activity at the mid right clavicle, corresponding with the fracture seen on recent CT. Faintly increased activity is noted in the right scapular body, and this may  correspond with a small lucent lesion on CT, best seen on coronal image 9/78. There is faintly increased activity along the anterior aspect of the right 10th rib. There is focally increased activity along the posterior aspect of the right sacroiliac joint, best seen on the posterior images. There is faintly increased activity in the thoracolumbar spine which appears degenerative. The soft tissue activity appears normal. IMPRESSION: 1. Nonspecific uptake in the mid right clavicle at the site of the recently demonstrated fracture. 2. Nonspecific focal uptake in the right scapular body and posterior aspect of the right sacroiliac joint, potentially metastases or foci of multiple myeloma. No other osseous activity suspicious for metastatic disease. Electronically Signed   By: Richardean Sale M.D.   On: 04/10/2019 13:00   Ct Abdomen Pelvis W Contrast  Result Date: 04/17/2019 CLINICAL DATA:  Pathologic right clavicle fracture. Assess  for primary malignancy. EXAM: CT ABDOMEN AND PELVIS WITH CONTRAST TECHNIQUE: Multidetector CT imaging of the abdomen and pelvis was performed using the standard protocol following bolus administration of intravenous contrast. CONTRAST:  153m OMNIPAQUE IOHEXOL 300 MG/ML  SOLN COMPARISON:  Chest CT 04/01/2019. Whole-body bone scan 04/09/2019. Report only from abdominopelvic CT 01/05/2009. FINDINGS: Lower chest: Mild emphysematous changes at both lung bases. No significant pleural or pericardial effusion. Hepatobiliary: There is an 8 mm low-density lesion in the left hepatic lobe on image 22/2, too small to characterize. No other focal hepatic abnormalities are identified. There is no significant biliary dilatation post cholecystectomy. Pancreas: Unremarkable. No pancreatic ductal dilatation or surrounding inflammatory changes. Spleen: Normal in size without focal abnormality. Adrenals/Urinary Tract: Both adrenal glands appear normal. No suspicious renal findings. No evidence of urinary tract calculus or hydronephrosis. The bladder appears normal. Stomach/Bowel: No evidence of bowel wall thickening, distention or surrounding inflammatory change. There are extensive diverticular changes throughout the colon, greatest distally. Vascular/Lymphatic: There are no enlarged abdominal or pelvic lymph nodes. Mild aortic and branch vessel atherosclerosis. Reproductive: Mild enlargement of the prostate gland. The seminal vesicles appear normal. Other: Tiny umbilical hernia containing only fat. No ascites or peritoneal nodularity. Musculoskeletal: There are multiple lytic osseous lesions, some associated with soft tissue masses. The most prominent lesions involve the left 7th rib near the costovertebral junction (image 4/2), the T8 vertebral body (image 8/2), the L2 vertebral body, measuring 2.3 cm on image 43/2, the left 8th rib laterally measuring 3.1 cm on image 19/2, and the inferior aspect of the right sacrum, measuring up  to 2.8 cm on image 71/2. This latter finding is associated with a soft tissue mass which extends into the sciatic notch. There are several other smaller lesions. No pathologic fractures are identified. There is a peripherally enhancing fluid collection posterior to the right femoral greater trochanter without associated soft tissue mass, likely trochanteric bursitis. IMPRESSION: 1. Multiple lytic osseous lesions, mostly occult on recent whole-body bone scan, favoring multiple myeloma over metastatic disease. 2. No primary malignancy or definite evidence of non osseous metastatic disease in the abdomen or pelvis. There is a small indeterminate low-density lesion in the left hepatic lobe. 3. Diffuse colonic diverticulosis. 4.  Aortic Atherosclerosis (ICD10-I70.0). Electronically Signed   By: WRichardean SaleM.D.   On: 04/17/2019 13:08   Ct Bone Marrow Biopsy & Aspiration  Result Date: 05/01/2019 INDICATION: None pathologic fracture of the clavicle with multiple lytic bone lesions on recent CT EXAM: CT-GUIDED BONE MARROW ASPIRATION AND BIOPSY. MEDICATIONS: None. ANESTHESIA/SEDATION: Moderate (conscious) sedation was employed during this procedure. A total of Versed 0.5 mg  and Fentanyl 50 mcg was administered intravenously. Moderate Sedation Time: 21 minutes. The patient's level of consciousness and vital signs were monitored continuously by radiology nursing throughout the procedure under my direct supervision. FLUOROSCOPY TIME:  Not applicable COMPLICATIONS: None immediate. PROCEDURE: Informed written consent was obtained from the patient after a thorough discussion of the procedural risks, benefits and alternatives. All questions were addressed. Maximal Sterile Barrier Technique was utilized including caps, mask, sterile gowns, sterile gloves, sterile drape, hand hygiene and skin antiseptic. A timeout was performed prior to the initiation of the procedure. Utilizing 0.25% Marcaine as a local and deep periosteal  anesthetic, an Arrow On Control bone biopsy needle was placed into the right iliac bone. Initial aspirates were obtained and sent for pathologic evaluation. These were deemed adequate. Subsequently a single core of bone was obtained and sent for pathologic evaluation. This was also deemed adequate. Needle was then removed. Hemostasis was obtained at the puncture site. The patient tolerated the procedure well was returned his room in satisfactory condition. IMPRESSION: Successful CT-guided bone marrow biopsy Electronically Signed   By: Inez Catalina M.D.   On: 05/01/2019 10:11    Assessment/Plan 1. Lower extremity edema  No surgery or intervention at this point in time.    I have reviewed my discussion with the patient regarding lymphedema and why it  causes symptoms.  Patient will continue wearing graduated compression stockings class 1 (20-30 mmHg) on a daily basis a prescription was given. The patient is reminded to put the stockings on first thing in the morning and removing them in the evening. The patient is instructed specifically not to sleep in the stockings.   In addition, behavioral modification throughout the day will be continued.  This will include frequent elevation (such as in a recliner), use of over the counter pain medications as needed and exercise such as walking.  I have reviewed systemic causes for chronic edema such as liver, kidney and cardiac etiologies and there does not appear to be any significant changes in these organ systems over the past year.  The patient is under the impression that these organ systems are all stable and unchanged.    The patient will continue aggressive use of the  lymph pump.  This will continue to improve the edema control and prevent sequela such as ulcers and infections.   The patient will follow-up with me on an annual basis.    2. Mixed hyperlipidemia Continue statin as ordered and reviewed, no changes at this time   3. Pulmonary  emphysema, unspecified emphysema type ([place]) Continue pulmonary medications and aerosols as already ordered, these medications have been reviewed and there are no changes at this time.     Hortencia Pilar, MD  05/07/2019 8:26 AM

## 2019-05-09 ENCOUNTER — Encounter (HOSPITAL_COMMUNITY): Payer: Self-pay | Admitting: Oncology

## 2019-05-09 DIAGNOSIS — M25511 Pain in right shoulder: Secondary | ICD-10-CM | POA: Diagnosis not present

## 2019-05-09 DIAGNOSIS — S42001S Fracture of unspecified part of right clavicle, sequela: Secondary | ICD-10-CM | POA: Diagnosis not present

## 2019-05-09 DIAGNOSIS — G20 Parkinson's disease: Secondary | ICD-10-CM | POA: Diagnosis not present

## 2019-05-09 DIAGNOSIS — R41841 Cognitive communication deficit: Secondary | ICD-10-CM | POA: Diagnosis not present

## 2019-05-09 DIAGNOSIS — M6281 Muscle weakness (generalized): Secondary | ICD-10-CM | POA: Diagnosis not present

## 2019-05-09 DIAGNOSIS — R2689 Other abnormalities of gait and mobility: Secondary | ICD-10-CM | POA: Diagnosis not present

## 2019-05-10 ENCOUNTER — Encounter (HOSPITAL_COMMUNITY): Payer: Self-pay | Admitting: Oncology

## 2019-05-10 DIAGNOSIS — G20 Parkinson's disease: Secondary | ICD-10-CM | POA: Diagnosis not present

## 2019-05-10 DIAGNOSIS — S42001S Fracture of unspecified part of right clavicle, sequela: Secondary | ICD-10-CM | POA: Diagnosis not present

## 2019-05-10 DIAGNOSIS — R2689 Other abnormalities of gait and mobility: Secondary | ICD-10-CM | POA: Diagnosis not present

## 2019-05-10 DIAGNOSIS — M6281 Muscle weakness (generalized): Secondary | ICD-10-CM | POA: Diagnosis not present

## 2019-05-10 DIAGNOSIS — R41841 Cognitive communication deficit: Secondary | ICD-10-CM | POA: Diagnosis not present

## 2019-05-10 DIAGNOSIS — M25511 Pain in right shoulder: Secondary | ICD-10-CM | POA: Diagnosis not present

## 2019-05-11 ENCOUNTER — Other Ambulatory Visit: Payer: Self-pay | Admitting: *Deleted

## 2019-05-11 ENCOUNTER — Inpatient Hospital Stay: Payer: Medicare Other | Attending: Oncology | Admitting: Oncology

## 2019-05-11 DIAGNOSIS — E785 Hyperlipidemia, unspecified: Secondary | ICD-10-CM

## 2019-05-11 DIAGNOSIS — D649 Anemia, unspecified: Secondary | ICD-10-CM | POA: Diagnosis not present

## 2019-05-11 DIAGNOSIS — C9000 Multiple myeloma not having achieved remission: Secondary | ICD-10-CM

## 2019-05-11 DIAGNOSIS — K219 Gastro-esophageal reflux disease without esophagitis: Secondary | ICD-10-CM

## 2019-05-11 DIAGNOSIS — Z79899 Other long term (current) drug therapy: Secondary | ICD-10-CM

## 2019-05-11 DIAGNOSIS — Z7982 Long term (current) use of aspirin: Secondary | ICD-10-CM | POA: Diagnosis not present

## 2019-05-11 MED ORDER — LENALIDOMIDE 10 MG PO CAPS: 10.0000 mg | ORAL_CAPSULE | Freq: Every day | ORAL | 5 refills | Status: DC

## 2019-05-14 ENCOUNTER — Telehealth: Payer: Self-pay | Admitting: Pharmacist

## 2019-05-14 ENCOUNTER — Telehealth: Payer: Self-pay | Admitting: Pharmacy Technician

## 2019-05-14 DIAGNOSIS — C9000 Multiple myeloma not having achieved remission: Secondary | ICD-10-CM

## 2019-05-14 MED ORDER — LENALIDOMIDE 10 MG PO CAPS: 10.0000 mg | ORAL_CAPSULE | Freq: Every day | ORAL | 0 refills | Status: DC

## 2019-05-14 NOTE — Telephone Encounter (Signed)
Oral Oncology Patient Advocate Encounter  Called [facility] to complete Revlimid PA that was started on Friday and archived.  Request was archived while awaiting clarification on rx.  Spoke with Vickii Chafe at [employer] and completed PA questions.    Revlimid has been approved from 05/14/2019 until 05/13/2020.    Patient must use [facility] to fill Revlimid.    [place] Patient [place] Phone [phone] Fax [phone] 05/14/2019 10:13 AM

## 2019-05-14 NOTE — Telephone Encounter (Signed)
Oral Chemotherapy Pharmacist Encounter   Reviewed and agree with Jade's Revlimid assessment.  Darl Pikes, PharmD, BCPS, BCOP, CPP Hematology/Oncology Clinical Pharmacist [facility] [phone]  05/14/2019 2:47 PM

## 2019-05-14 NOTE — Telephone Encounter (Signed)
Oral Oncology Pharmacy Student Encounter  Received new prescription for Revlimid for the treatment of multiple myeloma , planned duration until disease progression or toxicity.  CBC with differential from 05/01/2019 assessed, no lab abnormalities. CMP from 04/04/2019 reviewed, calculated CrCl is 84 ml/min. Recommend repeat CMP. Prescription dose and frequency assessed.   Current medication list in Epic reviewed, no DDIs with Revlimid identified.  Prescription has been e-scribed to [facility] for benefits analysis and approval.  Oral Oncology Clinic will continue to follow for insurance authorization, copayment issues, initial counseling and start date.  Adam Moss, Pharmacy Student [facility]/AP [facility] [phone]  05/14/2019 9:08 AM

## 2019-05-15 NOTE — Telephone Encounter (Signed)
Called to check status of Revlimid at [facility] this morning and rep stated that the rx was in process and I could call back this afternoon to check the copay.  I called back this afternoon and the rep stated that they needed prescription insurance information for patient.  I verbally gave her all the billing information over the phone and she stated that she would mark it STAT.  I will call back tomorrow to check copay status.  [facility] phone number:  [phone]

## 2019-05-17 DIAGNOSIS — R2689 Other abnormalities of gait and mobility: Secondary | ICD-10-CM | POA: Diagnosis not present

## 2019-05-17 DIAGNOSIS — S42001S Fracture of unspecified part of right clavicle, sequela: Secondary | ICD-10-CM | POA: Diagnosis not present

## 2019-05-17 DIAGNOSIS — M25511 Pain in right shoulder: Secondary | ICD-10-CM | POA: Diagnosis not present

## 2019-05-17 DIAGNOSIS — M6281 Muscle weakness (generalized): Secondary | ICD-10-CM | POA: Diagnosis not present

## 2019-05-17 DIAGNOSIS — R41841 Cognitive communication deficit: Secondary | ICD-10-CM | POA: Diagnosis not present

## 2019-05-17 DIAGNOSIS — G20 Parkinson's disease: Secondary | ICD-10-CM | POA: Diagnosis not present

## 2019-05-21 NOTE — Telephone Encounter (Signed)
Called to check status of prescription.  Rx insurance verification was completed today and the patient has not been assigned a representative yet, but will be hopefully assigned by 2pm.  Representative gave me the direct number for the patient to call to set up shipment.  That information is noted below.  [facility] [employer] phone: [phone].  Patient is to ask for "Special Handling Department" so he can complete new patient call and get medication set up for delivery.  Copay is $50.00.  Adam Moss Patient [place] Phone [phone] Fax [phone] 05/21/2019 12:45 PM

## 2019-05-22 DIAGNOSIS — M6281 Muscle weakness (generalized): Secondary | ICD-10-CM | POA: Diagnosis not present

## 2019-05-22 DIAGNOSIS — R2689 Other abnormalities of gait and mobility: Secondary | ICD-10-CM | POA: Diagnosis not present

## 2019-05-22 DIAGNOSIS — G20 Parkinson's disease: Secondary | ICD-10-CM | POA: Diagnosis not present

## 2019-05-22 DIAGNOSIS — R41841 Cognitive communication deficit: Secondary | ICD-10-CM | POA: Diagnosis not present

## 2019-05-22 DIAGNOSIS — S42001S Fracture of unspecified part of right clavicle, sequela: Secondary | ICD-10-CM | POA: Diagnosis not present

## 2019-05-22 DIAGNOSIS — M25511 Pain in right shoulder: Secondary | ICD-10-CM | POA: Diagnosis not present

## 2019-05-23 ENCOUNTER — Telehealth: Payer: Self-pay | Admitting: Pharmacist

## 2019-05-23 NOTE — Telephone Encounter (Signed)
Oral Chemotherapy Pharmacist Encounter   [name] called to let us know his medication will be delivered on 10/29. He plans to start on 11/2/  Darl Pikes, PharmD, BCPS, [facility] Hematology/Oncology Clinical Pharmacist [facility] [phone]  05/23/2019 2:03 PM

## 2019-05-23 NOTE — Telephone Encounter (Signed)
Oral Chemotherapy Pharmacist Encounter  Patient Education I spoke with patient for overview of new oral chemotherapy medication: Revlimid (lenalidomide) for the treatment of multiple myeloma, planned duration until disease progression or unacceptable drug toxicity.   Counseled patient on administration, dosing, side effects, monitoring, drug-food interactions, safe handling, storage, and disposal. Patient will take 1 capsule (10 mg total) by mouth daily.  Side effects include but not limited to: decreased wbc/hgb/plt, rash/itchy skin, N/V, diarrhea or constipation.    Reviewed with patient importance of keeping a medication schedule and plan for any missed doses.  Adam Moss voiced understanding and appreciation. All questions answered. Medication handout placed in the mail.  Provided patient with Oral [facility] phone number. Patient knows to call the office with questions or concerns. Oral Chemotherapy Navigation Clinic will continue to follow.  Darl Pikes, PharmD, BCPS, [facility] Hematology/Oncology Clinical Pharmacist [facility] [place] [facility] [phone]  05/23/2019 1:51 PM

## 2019-05-24 DIAGNOSIS — E538 Deficiency of other specified B group vitamins: Secondary | ICD-10-CM | POA: Diagnosis not present

## 2019-05-24 DIAGNOSIS — Z13220 Encounter for screening for lipoid disorders: Secondary | ICD-10-CM | POA: Diagnosis not present

## 2019-05-24 DIAGNOSIS — C9000 Multiple myeloma not having achieved remission: Secondary | ICD-10-CM | POA: Diagnosis not present

## 2019-05-24 DIAGNOSIS — I890 Lymphedema, not elsewhere classified: Secondary | ICD-10-CM | POA: Diagnosis not present

## 2019-05-24 DIAGNOSIS — D519 Vitamin B12 deficiency anemia, unspecified: Secondary | ICD-10-CM | POA: Diagnosis not present

## 2019-05-24 DIAGNOSIS — I10 Essential (primary) hypertension: Secondary | ICD-10-CM | POA: Diagnosis not present

## 2019-05-24 DIAGNOSIS — J449 Chronic obstructive pulmonary disease, unspecified: Secondary | ICD-10-CM | POA: Diagnosis not present

## 2019-05-24 DIAGNOSIS — E782 Mixed hyperlipidemia: Secondary | ICD-10-CM | POA: Diagnosis not present

## 2019-05-24 DIAGNOSIS — E559 Vitamin D deficiency, unspecified: Secondary | ICD-10-CM | POA: Diagnosis not present

## 2019-05-24 DIAGNOSIS — G20 Parkinson's disease: Secondary | ICD-10-CM | POA: Diagnosis not present

## 2019-05-25 DIAGNOSIS — R2689 Other abnormalities of gait and mobility: Secondary | ICD-10-CM | POA: Diagnosis not present

## 2019-05-25 DIAGNOSIS — M6281 Muscle weakness (generalized): Secondary | ICD-10-CM | POA: Diagnosis not present

## 2019-05-25 DIAGNOSIS — M25511 Pain in right shoulder: Secondary | ICD-10-CM | POA: Diagnosis not present

## 2019-05-25 DIAGNOSIS — G20 Parkinson's disease: Secondary | ICD-10-CM | POA: Diagnosis not present

## 2019-05-25 DIAGNOSIS — S42001S Fracture of unspecified part of right clavicle, sequela: Secondary | ICD-10-CM | POA: Diagnosis not present

## 2019-05-25 DIAGNOSIS — R41841 Cognitive communication deficit: Secondary | ICD-10-CM | POA: Diagnosis not present

## 2019-06-04 ENCOUNTER — Other Ambulatory Visit: Payer: Self-pay | Admitting: Oncology

## 2019-06-08 ENCOUNTER — Encounter: Payer: Self-pay | Admitting: Oncology

## 2019-06-08 ENCOUNTER — Other Ambulatory Visit: Payer: Self-pay

## 2019-06-08 NOTE — Progress Notes (Signed)
Patient pre screened for office appointment, no questions or concerns today. Patient reminded of upcoming appointment time and date. 

## 2019-06-08 NOTE — Progress Notes (Signed)
[place]  [phone] [phone]) [phone]  ID: Mauri Pole OB: Jan 24, 1934  MR#: [id number]  ASN#:[id number]  Patient Care Team: Perrin Maltese, MD as PCP - General (Internal Medicine)   CHIEF COMPLAINT: Kappa light chain myeloma.   INTERVAL HISTORY: Patient returns to clinic today for further evaluation and initiation of Zometa.  He initiated low-dose Revlimid approximately 2 weeks ago and is tolerating treatment well without significant side effects.  He does not complain of pain today.  He has a resting tremor secondary to his Parkinson's, but no other neurologic complaints.  He denies any recent fevers or illnesses.  He has good appetite and denies weight loss.  He has no chest pain, shortness of breath, cough, or hemoptysis.  He denies any nausea, vomiting, constipation, or diarrhea.  He has no urinary complaints.  Patient offers no further specific complaints today.  REVIEW OF SYSTEMS:   Review of Systems  Constitutional: Negative.  Negative for fever, malaise/fatigue and weight loss.  Respiratory: Negative.  Negative for cough, hemoptysis and shortness of breath.   Cardiovascular: Negative.  Negative for chest pain and leg swelling.  Gastrointestinal: Negative.  Negative for abdominal pain.  Genitourinary: Negative.  Negative for dysuria.  Musculoskeletal: Negative.  Negative for back pain.  Skin: Negative.  Negative for rash.  Neurological: Positive for tremors. Negative for dizziness, focal weakness, weakness and headaches.  Psychiatric/Behavioral: Negative.  The patient is not nervous/anxious.     As per HPI. Otherwise, a complete review of systems is negative.  PAST MEDICAL HISTORY: Past Medical History:  Diagnosis Date  . Cancer ([place])    skin  . COPD (chronic obstructive pulmonary disease) ([place])   . Hyperlipemia   . Hypertension   . Lymphedema of both lower extremities   . Parkinson disease ([place])   . Parkinsonism ([place]) 02/21/2015   . Spinal stenosis   . Tremor     PAST SURGICAL HISTORY: Past Surgical History:  Procedure Laterality Date  . APPENDECTOMY    . CATARACT EXTRACTION    . CHOLECYSTECTOMY    . TONSILLECTOMY    . VASECTOMY      FAMILY HISTORY: Family History  Problem Relation Age of Onset  . Cancer Mother   . Heart disease Father     ADVANCED DIRECTIVES (Y/N):  N  HEALTH MAINTENANCE: Social History   Tobacco Use  . Smoking status: Never Smoker  . Smokeless tobacco: Never Used  Substance Use Topics  . Alcohol use: No    Alcohol/week: 0.0 standard drinks  . Drug use: No     Colonoscopy:  PAP:  Bone density:  Lipid panel:  Allergies  Allergen Reactions  . Carbidopa-Levodopa Other (See Comments)    Severe stomach pains, can take rytary  . Oxycodone-Acetaminophen Other (See Comments)    "boils on skin"  . Tylenol [Acetaminophen]   . Tyloxapol   . Pramipexole Nausea Only    Current Outpatient Medications  Medication Sig Dispense Refill  . aspirin 81 MG tablet Take 81 mg by mouth daily.    Marland Kitchen BREO ELLIPTA 100-25 MCG/INH AEPB USE 1 INHALATION ORALLY    DAILY 60 each 1  . Calcium Citrate 250 MG TABS Take 1 tablet by mouth daily.     . Carbidopa-Levodopa ER (RYTARY) 48.75-195 MG CPCR Take 195 mg by mouth 5 (five) times daily. 450 capsule 3  . cetirizine (ZYRTEC) 10 MG tablet Take 10 mg by mouth daily.    . finasteride (PROSCAR) 5 MG tablet Take  5 mg by mouth daily.    . fluticasone (FLONASE) 50 MCG/ACT nasal spray Place into both nostrils daily.    . furosemide (LASIX) 20 MG tablet Take 1 tablet by mouth daily.    Marland Kitchen ipratropium (ATROVENT) 0.03 % nasal spray Place 0.03 mLs into both nostrils 3 (three) times daily as needed.  11  . lenalidomide (REVLIMID) 10 MG capsule Take 1 capsule (10 mg total) by mouth daily. 28 capsule 0  . niacin 500 MG tablet Take 500 mg by mouth every morning.     . olmesartan (BENICAR) 20 MG tablet Take 20 mg by mouth daily.    Marland Kitchen omeprazole (PRILOSEC) 40 MG  capsule Take 40 mg by mouth daily.    Marland Kitchen PROAIR HFA 108 (90 Base) MCG/ACT inhaler INHALE 2 PUFFS INTO THE LUNGS AS NEEDED. 17 Inhaler 3  . Simethicone (GAS RELIEF PO) Take 1 Dose by mouth as needed.     . simvastatin (ZOCOR) 10 MG tablet Take 10 mg by mouth daily.    . tamsulosin (FLOMAX) 0.4 MG CAPS capsule Take 0.4 mg by mouth daily.     . vitamin C (ASCORBIC ACID) 500 MG tablet Take 500 mg by mouth daily.     No current facility-administered medications for this visit.     OBJECTIVE: Vitals:   06/11/19 1049  BP: (!) 89/68  Pulse: (!) 101     Body mass index is 27.89 kg/m.    ECOG FS:1 - Symptomatic but completely ambulatory  General: Well-developed, well-nourished, no acute distress. Eyes: Pink conjunctiva, anicteric sclera. HEENT: Normocephalic, moist mucous membranes. Lungs: Clear to auscultation bilaterally. Heart: Regular rate and rhythm. No rubs, murmurs, or gallops. Abdomen: Soft, nontender, nondistended. No organomegaly noted, normoactive bowel sounds. Musculoskeletal: No edema, cyanosis, or clubbing. Neuro: Alert, answering all questions appropriately. Cranial nerves grossly intact. Skin: No rashes or petechiae noted. Psych: Normal affect.  LAB RESULTS:  Lab Results  Component Value Date   NA 133 (L) 06/11/2019   K 3.5 06/11/2019   CL 104 06/11/2019   CO2 21 (L) 06/11/2019   GLUCOSE 146 (H) 06/11/2019   BUN 17 06/11/2019   CREATININE 0.95 06/11/2019   CALCIUM 8.8 (L) 06/11/2019   PROT 6.2 (L) 06/11/2019   ALBUMIN 3.9 06/11/2019   AST 27 06/11/2019   ALT <5 06/11/2019   ALKPHOS 74 06/11/2019   BILITOT 0.8 06/11/2019   GFRNONAA >60 06/11/2019   GFRAA >60 06/11/2019    Lab Results  Component Value Date   WBC 5.7 06/11/2019   NEUTROABS 2.9 06/11/2019   HGB 11.5 (L) 06/11/2019   HCT 34.6 (L) 06/11/2019   MCV 91.5 06/11/2019   PLT 149 (L) 06/11/2019     STUDIES: No results found.  ASSESSMENT: Kappa light chain myeloma.  PLAN:    1. Kappa light  chain myeloma: Bone marrow biopsy on May 01, 2019 revealed increased plasma cell of 30 to 40% with kappa light chain restriction.  SPEP is negative and IgG, IgA, and IgM are essentially within normal limits.  Patient has a significantly elevated kappa free light chain of 722.7 as well as an elevated kappa/lambda light chain ratio of 176.27.  He only has a mild anemia and his calcium and creatinine are within normal limits.  He has evidence of endorgan damage from his right clavicle pathologic fracture. Although nuclear med bone scan was unrevealing, CT of the abdomen pelvis did reveal multiple osseous lytic lesions highly suspicious for underlying multiple myeloma.  Patient wishes to  pursue treatment, although given his underlying Parkinson's and advanced age does not wish to pursue fully aggressive chemotherapy.  Patient initiated low-dose 10 mg Revlimid daily approximately 2 weeks ago.  Proceed with Zometa today.  Return to clinic in 4 weeks for repeat laboratory work, further evaluation, and continuation of treatment.   2.  Right clavicle fracture: Pathologic.  Continue follow-up with orthopedics as recommended. 3.  Hypocalcemia: Okay to proceed with Zometa as above. 4.  Anemia: Chronic and unchanged.  Monitor.  Patient expressed understanding and was in agreement with this plan. He also understands that He can call clinic at any time with any questions, concerns, or complaints.    Lloyd Huger, MD   06/11/2019 12:24 PM

## 2019-06-10 ENCOUNTER — Other Ambulatory Visit: Payer: Self-pay

## 2019-06-10 DIAGNOSIS — C9000 Multiple myeloma not having achieved remission: Secondary | ICD-10-CM

## 2019-06-11 ENCOUNTER — Other Ambulatory Visit: Payer: Self-pay

## 2019-06-11 ENCOUNTER — Inpatient Hospital Stay: Payer: Medicare Other

## 2019-06-11 ENCOUNTER — Inpatient Hospital Stay (HOSPITAL_BASED_OUTPATIENT_CLINIC_OR_DEPARTMENT_OTHER): Payer: Medicare Other | Admitting: Oncology

## 2019-06-11 ENCOUNTER — Inpatient Hospital Stay: Payer: Medicare Other | Attending: Oncology

## 2019-06-11 VITALS — BP 89/68 | HR 101 | Wt 194.4 lb

## 2019-06-11 DIAGNOSIS — Z7982 Long term (current) use of aspirin: Secondary | ICD-10-CM | POA: Diagnosis not present

## 2019-06-11 DIAGNOSIS — C9000 Multiple myeloma not having achieved remission: Secondary | ICD-10-CM | POA: Diagnosis not present

## 2019-06-11 DIAGNOSIS — I10 Essential (primary) hypertension: Secondary | ICD-10-CM | POA: Insufficient documentation

## 2019-06-11 DIAGNOSIS — K219 Gastro-esophageal reflux disease without esophagitis: Secondary | ICD-10-CM | POA: Diagnosis not present

## 2019-06-11 DIAGNOSIS — J449 Chronic obstructive pulmonary disease, unspecified: Secondary | ICD-10-CM | POA: Diagnosis not present

## 2019-06-11 DIAGNOSIS — G20 Parkinson's disease: Secondary | ICD-10-CM | POA: Diagnosis not present

## 2019-06-11 DIAGNOSIS — E785 Hyperlipidemia, unspecified: Secondary | ICD-10-CM | POA: Insufficient documentation

## 2019-06-11 DIAGNOSIS — D649 Anemia, unspecified: Secondary | ICD-10-CM | POA: Diagnosis not present

## 2019-06-11 DIAGNOSIS — Z7951 Long term (current) use of inhaled steroids: Secondary | ICD-10-CM | POA: Diagnosis not present

## 2019-06-11 DIAGNOSIS — Z8249 Family history of ischemic heart disease and other diseases of the circulatory system: Secondary | ICD-10-CM | POA: Insufficient documentation

## 2019-06-11 DIAGNOSIS — Z79899 Other long term (current) drug therapy: Secondary | ICD-10-CM | POA: Diagnosis not present

## 2019-06-11 LAB — CBC WITH DIFFERENTIAL/PLATELET
Abs Immature Granulocytes: 0.03 10*3/uL (ref 0.00–0.07)
Basophils Absolute: 0 10*3/uL (ref 0.0–0.1)
Basophils Relative: 0 %
Eosinophils Absolute: 0.3 10*3/uL (ref 0.0–0.5)
Eosinophils Relative: 5 %
HCT: 34.6 % — ABNORMAL LOW (ref 39.0–52.0)
Hemoglobin: 11.5 g/dL — ABNORMAL LOW (ref 13.0–17.0)
Immature Granulocytes: 1 %
Lymphocytes Relative: 34 %
Lymphs Abs: 2 10*3/uL (ref 0.7–4.0)
MCH: 30.4 pg (ref 26.0–34.0)
MCHC: 33.2 g/dL (ref 30.0–36.0)
MCV: 91.5 fL (ref 80.0–100.0)
Monocytes Absolute: 0.5 10*3/uL (ref 0.1–1.0)
Monocytes Relative: 10 %
Neutro Abs: 2.9 10*3/uL (ref 1.7–7.7)
Neutrophils Relative %: 50 %
Platelets: 149 10*3/uL — ABNORMAL LOW (ref 150–400)
RBC: 3.78 MIL/uL — ABNORMAL LOW (ref 4.22–5.81)
RDW: 15.8 % — ABNORMAL HIGH (ref 11.5–15.5)
WBC: 5.7 10*3/uL (ref 4.0–10.5)
nRBC: 0 % (ref 0.0–0.2)

## 2019-06-11 LAB — COMPREHENSIVE METABOLIC PANEL
ALT: <5 U/L (ref 0–44)
AST: 27 U/L (ref 15–41)
Albumin: 3.9 g/dL (ref 3.5–5.0)
Alkaline Phosphatase: 74 U/L (ref 38–126)
Anion gap: 8 (ref 5–15)
BUN: 17 mg/dL (ref 8–23)
CO2: 21 mmol/L — ABNORMAL LOW (ref 22–32)
Calcium: 8.8 mg/dL — ABNORMAL LOW (ref 8.9–10.3)
Chloride: 104 mmol/L (ref 98–111)
Creatinine, Ser: 0.95 mg/dL (ref 0.61–1.24)
GFR calc Af Amer: >60 mL/min (ref >60)
GFR calc non Af Amer: >60 mL/min (ref >60)
Glucose, Bld: 146 mg/dL — ABNORMAL HIGH (ref 70–99)
Potassium: 3.5 mmol/L (ref 3.5–5.1)
Sodium: 133 mmol/L — ABNORMAL LOW (ref 135–145)
Total Bilirubin: 0.8 mg/dL (ref 0.3–1.2)
Total Protein: 6.2 g/dL — ABNORMAL LOW (ref 6.5–8.1)

## 2019-06-11 MED ORDER — ZOLEDRONIC ACID 4 MG/100ML IV SOLN
4.0000 mg | Freq: Once | INTRAVENOUS | Status: AC
  Administered 2019-06-11: 4 mg via INTRAVENOUS
  Filled 2019-06-11: qty 100

## 2019-06-11 MED ORDER — SODIUM CHLORIDE 0.9 % IV SOLN
Freq: Once | INTRAVENOUS | Status: AC
  Administered 2019-06-11: 12:00:00 via INTRAVENOUS
  Filled 2019-06-11: qty 250

## 2019-06-11 NOTE — Progress Notes (Signed)
Per Judson Roch RN per Dr. Grayland Ormond okay to proceed with Zometa with Calcium of 8.8 and VS (b/p 89/68).  Pt tolerated infusion well. Pt stable at discharge.

## 2019-06-11 NOTE — Progress Notes (Signed)
Pt hypotensive but alert, oriented, denies dizziness, SOB, lethargy. Endorses poor PO intake of liquids over last 24 hours. Dr. Grayland Ormond notified.

## 2019-06-12 DIAGNOSIS — S42024D Nondisplaced fracture of shaft of right clavicle, subsequent encounter for fracture with routine healing: Secondary | ICD-10-CM | POA: Diagnosis not present

## 2019-06-13 ENCOUNTER — Other Ambulatory Visit: Payer: Self-pay | Admitting: *Deleted

## 2019-06-13 DIAGNOSIS — C9000 Multiple myeloma not having achieved remission: Secondary | ICD-10-CM

## 2019-06-13 MED ORDER — LENALIDOMIDE 10 MG PO CAPS: 10.0000 mg | ORAL_CAPSULE | Freq: Every day | ORAL | 0 refills | Status: DC

## 2019-06-15 ENCOUNTER — Other Ambulatory Visit: Payer: Self-pay

## 2019-06-18 DIAGNOSIS — M545 Low back pain: Secondary | ICD-10-CM | POA: Diagnosis not present

## 2019-06-28 DIAGNOSIS — S42024D Nondisplaced fracture of shaft of right clavicle, subsequent encounter for fracture with routine healing: Secondary | ICD-10-CM | POA: Diagnosis not present

## 2019-06-28 DIAGNOSIS — R279 Unspecified lack of coordination: Secondary | ICD-10-CM | POA: Diagnosis not present

## 2019-06-28 DIAGNOSIS — R2689 Other abnormalities of gait and mobility: Secondary | ICD-10-CM | POA: Diagnosis not present

## 2019-06-28 DIAGNOSIS — M545 Low back pain: Secondary | ICD-10-CM | POA: Diagnosis not present

## 2019-06-28 DIAGNOSIS — G20 Parkinson's disease: Secondary | ICD-10-CM | POA: Diagnosis not present

## 2019-06-28 DIAGNOSIS — M6281 Muscle weakness (generalized): Secondary | ICD-10-CM | POA: Diagnosis not present

## 2019-06-28 DIAGNOSIS — S42001S Fracture of unspecified part of right clavicle, sequela: Secondary | ICD-10-CM | POA: Diagnosis not present

## 2019-06-28 DIAGNOSIS — M25511 Pain in right shoulder: Secondary | ICD-10-CM | POA: Diagnosis not present

## 2019-06-29 DIAGNOSIS — R2689 Other abnormalities of gait and mobility: Secondary | ICD-10-CM | POA: Diagnosis not present

## 2019-06-29 DIAGNOSIS — G20 Parkinson's disease: Secondary | ICD-10-CM | POA: Diagnosis not present

## 2019-06-29 DIAGNOSIS — S42001S Fracture of unspecified part of right clavicle, sequela: Secondary | ICD-10-CM | POA: Diagnosis not present

## 2019-06-29 DIAGNOSIS — M6281 Muscle weakness (generalized): Secondary | ICD-10-CM | POA: Diagnosis not present

## 2019-06-29 DIAGNOSIS — S42024D Nondisplaced fracture of shaft of right clavicle, subsequent encounter for fracture with routine healing: Secondary | ICD-10-CM | POA: Diagnosis not present

## 2019-06-29 DIAGNOSIS — M25511 Pain in right shoulder: Secondary | ICD-10-CM | POA: Diagnosis not present

## 2019-07-05 NOTE — Progress Notes (Signed)
Virtual Visit via Telephone Note  I connected with Mauri Pole on 07/06/19 at  3:30 PM EST by telephone and verified that I am speaking with the correct person using two identifiers.  Location: Patient: Home Provider: [facility] - [address], [place], [place], [place] [address]   I discussed the limitations, risks, security and privacy concerns of performing an evaluation and management service by telephone and the availability of in person appointments. I also discussed with the patient that there may be a patient responsible charge related to this service. The patient expressed understanding and agreed to proceed.  Patient consented to consult via telephone: Yes People present and their role in pt care: Pt   History of Present Illness:  85 year old male never smoker followed in our office for asthma/suspected COPD due to secondary smoke exposure  Past medical history: Chest pain, parkinsonism, lower extremity swelling, multiple myeloma, hyperlipidemia Smoking history: Never smoker, passive smoke exposure Maintenance: Breo 100 Patient of Dr. Mortimer Fries  Chief complaint: 6 month follow up   85 year old male never smoker last seen in our office in June/2020 by Dr. Elly Modena.  At that office visit patient was stable and encouraged to remain on [place].  He was recommended to follow-up in 6 months.   Patient completing televisit with our office today in light of the COVID-19 pandemic.  Patient recently just completed physical therapy this was his first appointment today.  He had broke his collarbone since last being seen by our office and through that work-up he was diagnosed with multiple myeloma.  He is currently taking Revlimid.  He is followed by oncology.  He reports that his breathing is still at his baseline.  He does not have to use his rescue inhaler.  He occasionally has some nasal congestion.  He does not have any increased cough.  Astelin nasal spray as  well as as needed nasal saline rinses help him with the nasal congestion.  He continues to take his Zyrtec.  Observations/Objective:  08/16/2018-spirometry with DLCO-FVC 3.37 (84% predicted), ratio 73, FEV1 2.47 (80% predicted), DLCO 15.5 (78% predicted), corrects for lung volumes flow volume loops show slight concavity   Social History   Tobacco Use  Smoking Status Never Smoker  Smokeless Tobacco Never Used    There is no immunization history on file for this patient.  We will request shot records from PCP in [place] Dr. Finis Bud   Assessment and Plan:  Non-seasonal allergic rhinitis Plan:  Ipatropium nasal spray as orded by ENT  Start nasal saline rinses daily   Asthma-COPD overlap syndrome ([place]) Plan:  Continue Breo 100 Start nasal saline rinses daily Continue Atrovent nasal sprays   Continue zyrtec   Multiple myeloma ([place]) Plan: Continue Revlimid   Continue follow up with oncology  Continue to work with PT   Healthcare maintenance We will request shot records from PCP in [place] Dr. Finis Bud    Follow Up Instructions:  Return in about 3 months (around 10/04/2019), or if symptoms worsen or fail to improve, for Follow up with Wyn Quaker FNP-C.   I discussed the assessment and treatment plan with the patient. The patient was provided an opportunity to ask questions and all were answered. The patient agreed with the plan and demonstrated an understanding of the instructions.   The patient was advised to call back or seek an in-person evaluation if the symptoms worsen or if the condition fails to improve as anticipated.  I  provided 28 minutes of non-face-to-face time during this encounter.   Lauraine Rinne, NP

## 2019-07-06 ENCOUNTER — Other Ambulatory Visit: Payer: Self-pay

## 2019-07-06 ENCOUNTER — Ambulatory Visit (INDEPENDENT_AMBULATORY_CARE_PROVIDER_SITE_OTHER): Payer: Medicare Other | Admitting: Pulmonary Disease

## 2019-07-06 ENCOUNTER — Encounter: Payer: Self-pay | Admitting: Pulmonary Disease

## 2019-07-06 DIAGNOSIS — C9000 Multiple myeloma not having achieved remission: Secondary | ICD-10-CM

## 2019-07-06 DIAGNOSIS — J449 Chronic obstructive pulmonary disease, unspecified: Secondary | ICD-10-CM

## 2019-07-06 DIAGNOSIS — S42001S Fracture of unspecified part of right clavicle, sequela: Secondary | ICD-10-CM | POA: Diagnosis not present

## 2019-07-06 DIAGNOSIS — J3089 Other allergic rhinitis: Secondary | ICD-10-CM | POA: Diagnosis not present

## 2019-07-06 DIAGNOSIS — M25511 Pain in right shoulder: Secondary | ICD-10-CM | POA: Diagnosis not present

## 2019-07-06 DIAGNOSIS — S42024D Nondisplaced fracture of shaft of right clavicle, subsequent encounter for fracture with routine healing: Secondary | ICD-10-CM | POA: Diagnosis not present

## 2019-07-06 DIAGNOSIS — M6281 Muscle weakness (generalized): Secondary | ICD-10-CM | POA: Diagnosis not present

## 2019-07-06 DIAGNOSIS — Z0000 Encounter for general adult medical examination without abnormal findings: Secondary | ICD-10-CM

## 2019-07-06 DIAGNOSIS — R2689 Other abnormalities of gait and mobility: Secondary | ICD-10-CM | POA: Diagnosis not present

## 2019-07-06 DIAGNOSIS — G20 Parkinson's disease: Secondary | ICD-10-CM | POA: Diagnosis not present

## 2019-07-06 MED ORDER — BREO ELLIPTA 100-25 MCG/INH IN AEPB: INHALATION_SPRAY | RESPIRATORY_TRACT | 3 refills | Status: DC

## 2019-07-06 NOTE — Assessment & Plan Note (Addendum)
Plan:  Continue Breo 100 Start nasal saline rinses daily Continue Atrovent nasal sprays   Continue zyrtec

## 2019-07-06 NOTE — Assessment & Plan Note (Addendum)
Plan: Continue Revlimid   Continue follow up with oncology  Continue to work with PT

## 2019-07-06 NOTE — Patient Instructions (Signed)
You were seen today by Lauraine Rinne, NP  for:   1. Non-seasonal allergic rhinitis, unspecified trigger  Continue Atrovent nasal spray  Continue Zyrtec  Start nasal saline rinses 1-2 times daily  2. Asthma-COPD overlap syndrome ([facility])  Breo Ellipta 100 >>> Take 1 puff daily in the morning right when you wake up >>>Rinse your mouth out after use >>>This is a daily maintenance inhaler, NOT a rescue inhaler >>>Contact our office if you are having difficulties affording or obtaining this medication >>>It is important for you to be able to take this daily and not miss any doses   Prescription sent for 90 day supply to [facility] as requested  3. Multiple myeloma not having achieved remission ([place])  Continue follow-up with oncology  4. Healthcare maintenance  We will request shot records from PCP in [place] Dr. Finis Bud   Continue to follow-up with physical therapy  We recommend today:  No orders of the defined types were placed in this encounter.  No orders of the defined types were placed in this encounter.  Meds ordered this encounter  Medications  . fluticasone furoate-vilanterol (BREO ELLIPTA) 100-25 MCG/INH AEPB    Sig: USE 1 INHALATION ORALLY    DAILY    Dispense:  180 each    Refill:  3    Follow Up:    Return in about 3 months (around 10/04/2019), or if symptoms worsen or fail to improve, for Follow up with Wyn Quaker FNP-C.   Please do your part to reduce the spread of COVID-19:      Reduce your risk of any infection  and COVID19 by using the similar precautions used for avoiding the common cold or flu:  Marland Kitchen Wash your hands often with soap and warm water for at least 20 seconds.  If soap and water are not readily available, use an alcohol-based hand sanitizer with at least 60% alcohol.  . If coughing or sneezing, cover your mouth and nose by coughing or sneezing into the elbow areas of your shirt or coat, into a tissue or into your sleeve (not your  hands). Langley Gauss A MASK when in public  . Avoid shaking hands with others and consider head nods or verbal greetings only. . Avoid touching your eyes, nose, or mouth with unwashed hands.  . Avoid close contact with people who are sick. . Avoid places or events with large numbers of people in one location, like concerts or sporting events. . If you have some symptoms but not all symptoms, continue to monitor at home and seek medical attention if your symptoms worsen. . If you are having a medical emergency, call 911.   [place] / e-Visit: [email or web address]         [facility]: [facility]: [phone]                   [facility]: [phone]     It is flu season:   >>> Best ways to protect herself from the flu: Receive the yearly flu vaccine, practice good hand hygiene washing with soap and also using hand sanitizer when available, eat a nutritious meals, get adequate rest, hydrate appropriately   Please contact the office if your symptoms worsen or you have concerns that you are not improving.   Thank you for choosing [facility] for your healthcare, and for allowing [place] to partner with  you on your healthcare journey. I am thankful to be able to provide care to you today.   Wyn Quaker FNP-C

## 2019-07-06 NOTE — Assessment & Plan Note (Signed)
Plan:  Ipatropium nasal spray as orded by ENT  Start nasal saline rinses daily

## 2019-07-06 NOTE — Assessment & Plan Note (Signed)
We will request shot records from PCP in [place] Dr. Idelle Leech [facility]

## 2019-07-06 NOTE — Progress Notes (Signed)
Patient pre screened for office appointment, no questions or concerns today. Patient reminded of upcoming appointment time and date. 

## 2019-07-08 NOTE — Progress Notes (Signed)
[name]  [phone]) [phone] [phone]) [phone]  ID: Adam Moss OB: March 31, 1934  MR#: [id number]  NLZ#:[id number]  Patient Care Team: Perrin Maltese, MD as PCP - General (Internal Medicine)   CHIEF COMPLAINT: Kappa light chain myeloma.   INTERVAL HISTORY: Patient returns to clinic today for repeat laboratory, further evaluation, and continuation of Zometa.  He continues to tolerate 10 mg Revlimid daily without significant side effects.  He has noticed a change in taste and decreased appetite, but denies weight loss.  He has some mild musculoskeletal pain in his right chest wall, but otherwise feels well.  He has a resting tremor secondary to his Parkinson's, but no other neurologic complaints.  He denies any recent fevers or illnesses.  He has no chest pain, shortness of breath, cough, or hemoptysis.  He denies any nausea, vomiting, constipation, or diarrhea.  He has no urinary complaints.  Patient offers no further specific complaints today.  REVIEW OF SYSTEMS:   Review of Systems  Constitutional: Negative.  Negative for fever, malaise/fatigue and weight loss.  Respiratory: Negative.  Negative for cough, hemoptysis and shortness of breath.   Cardiovascular: Negative.  Negative for chest pain and leg swelling.  Gastrointestinal: Negative.  Negative for abdominal pain.  Genitourinary: Negative.  Negative for dysuria.  Musculoskeletal: Negative.  Negative for back pain.  Skin: Negative.  Negative for rash.  Neurological: Positive for tremors. Negative for dizziness, focal weakness, weakness and headaches.  Psychiatric/Behavioral: Negative.  The patient is not nervous/anxious.     As per HPI. Otherwise, a complete review of systems is negative.  PAST MEDICAL HISTORY: Past Medical History:  Diagnosis Date  . Cancer ([place])    skin  . COPD (chronic obstructive pulmonary disease) ([place])   . Hyperlipemia   . Hypertension   . Lymphedema of both lower  extremities   . Parkinson disease ([place])   . Parkinsonism ([place]) 02/21/2015  . Spinal stenosis   . Tremor     PAST SURGICAL HISTORY: Past Surgical History:  Procedure Laterality Date  . APPENDECTOMY    . CATARACT EXTRACTION    . CHOLECYSTECTOMY    . TONSILLECTOMY    . VASECTOMY      FAMILY HISTORY: Family History  Problem Relation Age of Onset  . Cancer Mother   . Heart disease Father     ADVANCED DIRECTIVES (Y/N):  N  HEALTH MAINTENANCE: Social History   Tobacco Use  . Smoking status: Never Smoker  . Smokeless tobacco: Never Used  Substance Use Topics  . Alcohol use: No    Alcohol/week: 0.0 standard drinks  . Drug use: No     Colonoscopy:  PAP:  Bone density:  Lipid panel:  Allergies  Allergen Reactions  . Carbidopa-Levodopa Other (See Comments)    Severe stomach pains, can take rytary  . Oxycodone-Acetaminophen Other (See Comments)    "boils on skin"  . Tylenol [Acetaminophen]   . Tyloxapol   . Pramipexole Nausea Only    Current Outpatient Medications  Medication Sig Dispense Refill  . aspirin 81 MG tablet Take 81 mg by mouth daily.    . Calcium Citrate 250 MG TABS Take 1 tablet by mouth daily.     . Carbidopa-Levodopa ER (RYTARY) 48.75-195 MG CPCR Take 195 mg by mouth 5 (five) times daily. 450 capsule 3  . cetirizine (ZYRTEC) 10 MG tablet Take 10 mg by mouth daily.    . finasteride (PROSCAR) 5 MG tablet Take 5 mg by mouth daily.    Marland Kitchen  fluticasone (FLONASE) 50 MCG/ACT nasal spray Place into both nostrils daily.    . furosemide (LASIX) 20 MG tablet Take 1 tablet by mouth daily.    . ipratropium (ATROVENT) 0.03 % nasal spray Place 0.03 mLs into both nostrils 3 (three) times daily as needed.  11  . lenalidomide (REVLIMID) 10 MG capsule Take 1 capsule (10 mg total) by mouth daily. 28 capsule 0  . niacin 500 MG tablet Take 500 mg by mouth every morning.     . olmesartan (BENICAR) 20 MG tablet Take 20 mg by mouth daily.    . omeprazole (PRILOSEC) 40 MG  capsule Take 40 mg by mouth daily.    . PROAIR HFA 108 (90 Base) MCG/ACT inhaler INHALE 2 PUFFS INTO THE LUNGS AS NEEDED. 17 Inhaler 3  . Simethicone (GAS RELIEF PO) Take 1 Dose by mouth as needed.     . simvastatin (ZOCOR) 10 MG tablet Take 10 mg by mouth daily.    . tamsulosin (FLOMAX) 0.4 MG CAPS capsule Take 0.4 mg by mouth daily.     . vitamin C (ASCORBIC ACID) 500 MG tablet Take 500 mg by mouth daily.    . fluticasone furoate-vilanterol (BREO ELLIPTA) 100-25 MCG/INH AEPB USE 1 INHALATION ORALLY    DAILY 180 each 3   No current facility-administered medications for this visit.    OBJECTIVE: Vitals:   07/09/19 1308  BP: 103/70  Pulse: 86  Resp: 20  Temp: 97.7 F (36.5 C)  SpO2: 100%     Body mass index is 28.18 kg/m.    ECOG FS:1 - Symptomatic but completely ambulatory  General: Well-developed, well-nourished, no acute distress. Eyes: Pink conjunctiva, anicteric sclera. HEENT: Normocephalic, moist mucous membranes. Lungs: No audible wheezing or coughing. Heart: Regular rate and rhythm. Abdomen: Soft, nontender, no obvious distention. Musculoskeletal: No edema, cyanosis, or clubbing. Neuro: Alert, answering all questions appropriately. Cranial nerves grossly intact. Skin: No rashes or petechiae noted. Psych: Normal affect.  LAB RESULTS:  Lab Results  Component Value Date   NA 134 (L) 07/09/2019   K 4.0 07/09/2019   CL 101 07/09/2019   CO2 25 07/09/2019   GLUCOSE 120 (H) 07/09/2019   BUN 14 07/09/2019   CREATININE 0.91 07/09/2019   CALCIUM 8.5 (L) 07/09/2019   PROT 6.2 (L) 07/09/2019   ALBUMIN 3.6 07/09/2019   AST 30 07/09/2019   ALT 12 07/09/2019   ALKPHOS 72 07/09/2019   BILITOT 0.8 07/09/2019   GFRNONAA >60 07/09/2019   GFRAA >60 07/09/2019    Lab Results  Component Value Date   WBC 4.0 07/09/2019   NEUTROABS 1.8 07/09/2019   HGB 10.5 (L) 07/09/2019   HCT 34.2 (L) 07/09/2019   MCV 93.2 07/09/2019   PLT 112 (L) 07/09/2019     STUDIES: No results  found.  ASSESSMENT: Kappa light chain myeloma.  PLAN:    1. Kappa light chain myeloma: Bone marrow biopsy on May 01, 2019 revealed increased plasma cell of 30 to 40% with kappa light chain restriction.  SPEP is negative and IgG, IgA, and IgM are essentially within normal limits.  Patient has a significantly elevated kappa free light chain of 722.7 as well as an elevated kappa/lambda light chain ratio of 176.27.  He only has a mild anemia and his calcium and creatinine are within normal limits.  He has evidence of endorgan damage other than his right clavicle pathologic fracture. Although nuclear med bone scan was unrevealing, CT of the abdomen pelvis did reveal multiple osseous   lytic lesions highly suspicious for underlying multiple myeloma.  Patient wishes to pursue treatment, although given his underlying Parkinson's and advanced age does not wish to pursue fully aggressive chemotherapy.  Patient initiated low-dose 10 mg Revlimid daily at the beginning of November 2020.  Proceed with Zometa today.  Return to clinic in 4 weeks for repeat laboratory work, further evaluation, and continuation of treatment.   2.  Right clavicle fracture: Pathologic.  Resolved.  Patient reports no further interventions are needed by orthopedics. 3.  Hypocalcemia: Mild.  Okay to proceed with Zometa as above. 4.  Anemia: Chronic and unchanged.  Hemoglobin is 10.5 today. 5.  Thrombocytopenia: Possibly secondary to Revlimid.  Proceed with treatment as above.  Patient expressed understanding and was in agreement with this plan. He also understands that He can call clinic at any time with any questions, concerns, or complaints.    Lloyd Huger, MD   07/09/2019 1:39 PM

## 2019-07-09 ENCOUNTER — Inpatient Hospital Stay: Payer: Medicare Other | Attending: Oncology | Admitting: Emergency Medicine

## 2019-07-09 ENCOUNTER — Inpatient Hospital Stay: Payer: Medicare Other

## 2019-07-09 ENCOUNTER — Other Ambulatory Visit: Payer: Self-pay

## 2019-07-09 ENCOUNTER — Encounter: Payer: Self-pay | Admitting: Oncology

## 2019-07-09 ENCOUNTER — Inpatient Hospital Stay (HOSPITAL_BASED_OUTPATIENT_CLINIC_OR_DEPARTMENT_OTHER): Payer: Medicare Other | Admitting: Oncology

## 2019-07-09 VITALS — BP 103/70 | HR 86 | Temp 97.7°F | Resp 20 | Wt 196.4 lb

## 2019-07-09 DIAGNOSIS — K219 Gastro-esophageal reflux disease without esophagitis: Secondary | ICD-10-CM | POA: Insufficient documentation

## 2019-07-09 DIAGNOSIS — G20 Parkinson's disease: Secondary | ICD-10-CM | POA: Diagnosis not present

## 2019-07-09 DIAGNOSIS — I10 Essential (primary) hypertension: Secondary | ICD-10-CM | POA: Insufficient documentation

## 2019-07-09 DIAGNOSIS — C9000 Multiple myeloma not having achieved remission: Secondary | ICD-10-CM

## 2019-07-09 DIAGNOSIS — D6959 Other secondary thrombocytopenia: Secondary | ICD-10-CM | POA: Diagnosis not present

## 2019-07-09 DIAGNOSIS — Z7982 Long term (current) use of aspirin: Secondary | ICD-10-CM | POA: Insufficient documentation

## 2019-07-09 DIAGNOSIS — D649 Anemia, unspecified: Secondary | ICD-10-CM | POA: Diagnosis not present

## 2019-07-09 DIAGNOSIS — M7918 Myalgia, other site: Secondary | ICD-10-CM | POA: Insufficient documentation

## 2019-07-09 DIAGNOSIS — Z7951 Long term (current) use of inhaled steroids: Secondary | ICD-10-CM | POA: Insufficient documentation

## 2019-07-09 DIAGNOSIS — J449 Chronic obstructive pulmonary disease, unspecified: Secondary | ICD-10-CM | POA: Insufficient documentation

## 2019-07-09 DIAGNOSIS — E785 Hyperlipidemia, unspecified: Secondary | ICD-10-CM | POA: Diagnosis not present

## 2019-07-09 DIAGNOSIS — Z8249 Family history of ischemic heart disease and other diseases of the circulatory system: Secondary | ICD-10-CM | POA: Insufficient documentation

## 2019-07-09 DIAGNOSIS — Z79899 Other long term (current) drug therapy: Secondary | ICD-10-CM | POA: Insufficient documentation

## 2019-07-09 LAB — CBC WITH DIFFERENTIAL/PLATELET
Abs Immature Granulocytes: 0.01 10*3/uL (ref 0.00–0.07)
Basophils Absolute: 0 10*3/uL (ref 0.0–0.1)
Basophils Relative: 1 %
Eosinophils Absolute: 0.6 10*3/uL — ABNORMAL HIGH (ref 0.0–0.5)
Eosinophils Relative: 16 %
HCT: 34.2 % — ABNORMAL LOW (ref 39.0–52.0)
Hemoglobin: 10.5 g/dL — ABNORMAL LOW (ref 13.0–17.0)
Immature Granulocytes: 0 %
Lymphocytes Relative: 30 %
Lymphs Abs: 1.2 10*3/uL (ref 0.7–4.0)
MCH: 28.6 pg (ref 26.0–34.0)
MCHC: 30.7 g/dL (ref 30.0–36.0)
MCV: 93.2 fL (ref 80.0–100.0)
Monocytes Absolute: 0.3 10*3/uL (ref 0.1–1.0)
Monocytes Relative: 8 %
Neutro Abs: 1.8 10*3/uL (ref 1.7–7.7)
Neutrophils Relative %: 45 %
Platelets: 112 10*3/uL — ABNORMAL LOW (ref 150–400)
RBC: 3.67 MIL/uL — ABNORMAL LOW (ref 4.22–5.81)
RDW: 17 % — ABNORMAL HIGH (ref 11.5–15.5)
WBC: 4 10*3/uL (ref 4.0–10.5)
nRBC: 0 % (ref 0.0–0.2)

## 2019-07-09 LAB — COMPREHENSIVE METABOLIC PANEL
ALT: 12 U/L (ref 0–44)
AST: 30 U/L (ref 15–41)
Albumin: 3.6 g/dL (ref 3.5–5.0)
Alkaline Phosphatase: 72 U/L (ref 38–126)
Anion gap: 8 (ref 5–15)
BUN: 14 mg/dL (ref 8–23)
CO2: 25 mmol/L (ref 22–32)
Calcium: 8.5 mg/dL — ABNORMAL LOW (ref 8.9–10.3)
Chloride: 101 mmol/L (ref 98–111)
Creatinine, Ser: 0.91 mg/dL (ref 0.61–1.24)
GFR calc Af Amer: >60 mL/min (ref >60)
GFR calc non Af Amer: >60 mL/min (ref >60)
Glucose, Bld: 120 mg/dL — ABNORMAL HIGH (ref 70–99)
Potassium: 4 mmol/L (ref 3.5–5.1)
Sodium: 134 mmol/L — ABNORMAL LOW (ref 135–145)
Total Bilirubin: 0.8 mg/dL (ref 0.3–1.2)
Total Protein: 6.2 g/dL — ABNORMAL LOW (ref 6.5–8.1)

## 2019-07-09 MED ORDER — ZOLEDRONIC ACID 4 MG/100ML IV SOLN
4.0000 mg | Freq: Once | INTRAVENOUS | Status: AC
  Administered 2019-07-09: 4 mg via INTRAVENOUS
  Filled 2019-07-09: qty 100

## 2019-07-09 MED ORDER — SODIUM CHLORIDE 0.9 % IV SOLN
Freq: Once | INTRAVENOUS | Status: AC
  Administered 2019-07-09: 14:00:00 via INTRAVENOUS
  Filled 2019-07-09: qty 250

## 2019-07-11 DIAGNOSIS — M25511 Pain in right shoulder: Secondary | ICD-10-CM | POA: Diagnosis not present

## 2019-07-11 DIAGNOSIS — S42001S Fracture of unspecified part of right clavicle, sequela: Secondary | ICD-10-CM | POA: Diagnosis not present

## 2019-07-11 DIAGNOSIS — R2689 Other abnormalities of gait and mobility: Secondary | ICD-10-CM | POA: Diagnosis not present

## 2019-07-11 DIAGNOSIS — M6281 Muscle weakness (generalized): Secondary | ICD-10-CM | POA: Diagnosis not present

## 2019-07-11 DIAGNOSIS — G20 Parkinson's disease: Secondary | ICD-10-CM | POA: Diagnosis not present

## 2019-07-11 DIAGNOSIS — S42024D Nondisplaced fracture of shaft of right clavicle, subsequent encounter for fracture with routine healing: Secondary | ICD-10-CM | POA: Diagnosis not present

## 2019-07-13 DIAGNOSIS — R2689 Other abnormalities of gait and mobility: Secondary | ICD-10-CM | POA: Diagnosis not present

## 2019-07-13 DIAGNOSIS — M25511 Pain in right shoulder: Secondary | ICD-10-CM | POA: Diagnosis not present

## 2019-07-13 DIAGNOSIS — S42024D Nondisplaced fracture of shaft of right clavicle, subsequent encounter for fracture with routine healing: Secondary | ICD-10-CM | POA: Diagnosis not present

## 2019-07-13 DIAGNOSIS — S42001S Fracture of unspecified part of right clavicle, sequela: Secondary | ICD-10-CM | POA: Diagnosis not present

## 2019-07-13 DIAGNOSIS — M6281 Muscle weakness (generalized): Secondary | ICD-10-CM | POA: Diagnosis not present

## 2019-07-13 DIAGNOSIS — G20 Parkinson's disease: Secondary | ICD-10-CM | POA: Diagnosis not present

## 2019-07-16 ENCOUNTER — Other Ambulatory Visit: Payer: Self-pay | Admitting: *Deleted

## 2019-07-16 DIAGNOSIS — C9000 Multiple myeloma not having achieved remission: Secondary | ICD-10-CM

## 2019-07-16 MED ORDER — LENALIDOMIDE 10 MG PO CAPS: 10.0000 mg | ORAL_CAPSULE | Freq: Every day | ORAL | 0 refills | Status: DC

## 2019-07-17 DIAGNOSIS — R2689 Other abnormalities of gait and mobility: Secondary | ICD-10-CM | POA: Diagnosis not present

## 2019-07-17 DIAGNOSIS — S42024D Nondisplaced fracture of shaft of right clavicle, subsequent encounter for fracture with routine healing: Secondary | ICD-10-CM | POA: Diagnosis not present

## 2019-07-17 DIAGNOSIS — M25511 Pain in right shoulder: Secondary | ICD-10-CM | POA: Diagnosis not present

## 2019-07-17 DIAGNOSIS — M6281 Muscle weakness (generalized): Secondary | ICD-10-CM | POA: Diagnosis not present

## 2019-07-17 DIAGNOSIS — S42001S Fracture of unspecified part of right clavicle, sequela: Secondary | ICD-10-CM | POA: Diagnosis not present

## 2019-07-17 DIAGNOSIS — G20 Parkinson's disease: Secondary | ICD-10-CM | POA: Diagnosis not present

## 2019-07-18 ENCOUNTER — Other Ambulatory Visit: Payer: Self-pay | Admitting: Oncology

## 2019-07-18 DIAGNOSIS — C9000 Multiple myeloma not having achieved remission: Secondary | ICD-10-CM

## 2019-07-18 MED ORDER — LENALIDOMIDE 10 MG PO CAPS: 10.0000 mg | ORAL_CAPSULE | Freq: Every day | ORAL | 0 refills | Status: DC

## 2019-07-31 DIAGNOSIS — M205X9 Other deformities of toe(s) (acquired), unspecified foot: Secondary | ICD-10-CM | POA: Diagnosis not present

## 2019-07-31 DIAGNOSIS — I10 Essential (primary) hypertension: Secondary | ICD-10-CM | POA: Diagnosis not present

## 2019-07-31 DIAGNOSIS — J449 Chronic obstructive pulmonary disease, unspecified: Secondary | ICD-10-CM | POA: Diagnosis not present

## 2019-07-31 DIAGNOSIS — C9000 Multiple myeloma not having achieved remission: Secondary | ICD-10-CM | POA: Diagnosis not present

## 2019-07-31 DIAGNOSIS — G20 Parkinson's disease: Secondary | ICD-10-CM | POA: Diagnosis not present

## 2019-07-31 DIAGNOSIS — E782 Mixed hyperlipidemia: Secondary | ICD-10-CM | POA: Diagnosis not present

## 2019-07-31 DIAGNOSIS — N400 Enlarged prostate without lower urinary tract symptoms: Secondary | ICD-10-CM | POA: Diagnosis not present

## 2019-07-31 DIAGNOSIS — R0600 Dyspnea, unspecified: Secondary | ICD-10-CM | POA: Diagnosis not present

## 2019-08-02 DIAGNOSIS — N401 Enlarged prostate with lower urinary tract symptoms: Secondary | ICD-10-CM | POA: Insufficient documentation

## 2019-08-02 DIAGNOSIS — N400 Enlarged prostate without lower urinary tract symptoms: Secondary | ICD-10-CM | POA: Insufficient documentation

## 2019-08-04 NOTE — Progress Notes (Signed)
[place]  [phone] [phone]  ID: Adam Moss OB: 16-Jul-1934  MR#: [id number]  TFT#:[id number]  Patient Care Team: Perrin Maltese, MD as PCP - General (Internal Medicine)   CHIEF COMPLAINT: Kappa light chain myeloma.   INTERVAL HISTORY: Patient returns to clinic today for repeat laboratory, further evaluation, and continuation of Zometa.  He continues to tolerate Revlimid well without significant side effects.  He has a fair appetite, and admits to decreased fluid intake.  He otherwise feels well.  He has a resting tremor secondary to his Parkinson's, but no other neurologic complaints.  He denies any recent fevers or illnesses.  He has no chest pain, shortness of breath, cough, or hemoptysis.  He denies any nausea, vomiting, constipation, or diarrhea.  He has no urinary complaints.  Patient offers no further specific complaints today.  REVIEW OF SYSTEMS:   Review of Systems  Constitutional: Positive for malaise/fatigue. Negative for fever and weight loss.  Respiratory: Negative.  Negative for cough, hemoptysis and shortness of breath.   Cardiovascular: Negative.  Negative for chest pain and leg swelling.  Gastrointestinal: Negative.  Negative for abdominal pain.  Genitourinary: Negative.  Negative for dysuria.  Musculoskeletal: Negative.  Negative for back pain.  Skin: Negative.  Negative for rash.  Neurological: Positive for tremors and weakness. Negative for dizziness, focal weakness and headaches.  Psychiatric/Behavioral: Negative.  The patient is not nervous/anxious.     As per HPI. Otherwise, a complete review of systems is negative.  PAST MEDICAL HISTORY: Past Medical History:  Diagnosis Date  . Cancer ([place])    skin  . COPD (chronic obstructive pulmonary disease) ([place])   . Hyperlipemia   . Hypertension   . Lymphedema of both lower extremities   . Parkinson disease ([place])   . Parkinsonism ([place]) 02/21/2015  . Spinal  stenosis   . Tremor     PAST SURGICAL HISTORY: Past Surgical History:  Procedure Laterality Date  . APPENDECTOMY    . CATARACT EXTRACTION    . CHOLECYSTECTOMY    . TONSILLECTOMY    . VASECTOMY      FAMILY HISTORY: Family History  Problem Relation Age of Onset  . Cancer Mother   . Heart disease Father     ADVANCED DIRECTIVES (Y/N):  N  HEALTH MAINTENANCE: Social History   Tobacco Use  . Smoking status: Never Smoker  . Smokeless tobacco: Never Used  Substance Use Topics  . Alcohol use: No    Alcohol/week: 0.0 standard drinks  . Drug use: No     Colonoscopy:  PAP:  Bone density:  Lipid panel:  Allergies  Allergen Reactions  . Carbidopa-Levodopa Other (See Comments)    Severe stomach pains, can take rytary  . Oxycodone-Acetaminophen Other (See Comments)    "boils on skin"  . Tylenol [Acetaminophen]   . Tyloxapol   . Pramipexole Nausea Only    Current Outpatient Medications  Medication Sig Dispense Refill  . aspirin 81 MG tablet Take 81 mg by mouth daily.    . Calcium Carbonate-Vitamin D (OYSTER SHELL CALCIUM/D) 250-125 MG-UNIT TABS Take by mouth.    . Calcium Citrate 250 MG TABS Take 1 tablet by mouth daily.     . Carbidopa-Levodopa ER (RYTARY) 48.75-195 MG CPCR Take 195 mg by mouth 5 (five) times daily. 450 capsule 3  . cetirizine (ZYRTEC) 10 MG tablet Take 10 mg by mouth daily.    . cyanocobalamin (,VITAMIN B-12,) 1000 MCG/ML injection     . finasteride (  PROSCAR) 5 MG tablet Take 5 mg by mouth daily.    . fluticasone (FLONASE) 50 MCG/ACT nasal spray Place into both nostrils daily.    . fluticasone furoate-vilanterol (BREO ELLIPTA) 100-25 MCG/INH AEPB USE 1 INHALATION ORALLY    DAILY 180 each 3  . ipratropium (ATROVENT) 0.03 % nasal spray Place 0.03 mLs into both nostrils 3 (three) times daily as needed.  11  . lenalidomide (REVLIMID) 10 MG capsule Take 1 capsule (10 mg total) by mouth daily. 28 capsule 0  . niacin 500 MG tablet Take 500 mg by mouth every  morning.     . olmesartan (BENICAR) 20 MG tablet Take 20 mg by mouth daily.    Marland Kitchen omeprazole (PRILOSEC) 40 MG capsule Take 40 mg by mouth daily.    . simvastatin (ZOCOR) 10 MG tablet Take 10 mg by mouth daily.    . tamsulosin (FLOMAX) 0.4 MG CAPS capsule Take 0.4 mg by mouth daily.     . vitamin C (ASCORBIC ACID) 500 MG tablet Take 500 mg by mouth daily.    . furosemide (LASIX) 20 MG tablet Take 1 tablet by mouth daily.    . meloxicam (MOBIC) 15 MG tablet Take 15 mg by mouth daily.    . methocarbamol (ROBAXIN) 500 MG tablet methocarbamol 500 mg tablet  TAKE 1 TABLET BY MOUTH EVERY DAY    . PROAIR HFA 108 (90 Base) MCG/ACT inhaler INHALE 2 PUFFS INTO THE LUNGS AS NEEDED. (Patient not taking: Reported on 08/06/2019) 17 Inhaler 3  . Simethicone (GAS RELIEF PO) Take 1 Dose by mouth as needed.      No current facility-administered medications for this visit.    OBJECTIVE: Vitals:   08/06/19 1114 08/06/19 1117  BP: (!) 87/61 (!) 82/60  Pulse: 75   Resp: 18   Temp: 97.8 F (36.6 C)      Body mass index is 27.95 kg/m.    ECOG FS:1 - Symptomatic but completely ambulatory  General: Well-developed, well-nourished, no acute distress.  Sitting in a wheelchair. Eyes: Pink conjunctiva, anicteric sclera. HEENT: Normocephalic, moist mucous membranes. Lungs: No audible wheezing or coughing. Heart: Regular rate and rhythm. Abdomen: Soft, nontender, no obvious distention. Musculoskeletal: No edema, cyanosis, or clubbing. Neuro: Alert, answering all questions appropriately. Cranial nerves grossly intact. Skin: No rashes or petechiae noted. Psych: Normal affect.   LAB RESULTS:  Lab Results  Component Value Date   NA 135 08/06/2019   K 3.5 08/06/2019   CL 102 08/06/2019   CO2 23 08/06/2019   GLUCOSE 143 (H) 08/06/2019   BUN 15 08/06/2019   CREATININE 0.74 08/06/2019   CALCIUM 8.5 (L) 08/06/2019   PROT 6.1 (L) 08/06/2019   ALBUMIN 3.5 08/06/2019   AST 29 08/06/2019   ALT 10 08/06/2019    ALKPHOS 64 08/06/2019   BILITOT 0.6 08/06/2019   GFRNONAA >60 08/06/2019   GFRAA >60 08/06/2019    Lab Results  Component Value Date   WBC 3.0 (L) 08/06/2019   NEUTROABS 1.0 (L) 08/06/2019   HGB 10.7 (L) 08/06/2019   HCT 34.2 (L) 08/06/2019   MCV 92.9 08/06/2019   PLT 120 (L) 08/06/2019     STUDIES: No results found.  ASSESSMENT: Kappa light chain myeloma.  PLAN:    1. Kappa light chain myeloma: Bone marrow biopsy on May 01, 2019 revealed increased plasma cells of 30 to 40% with kappa light chain restriction.  SPEP is negative and IgG, IgA, and IgM are essentially within normal limits.  Patient has a significantly elevated kappa free light chain of 722.7 as well as an elevated kappa/lambda light chain ratio of 176.27.  He only has a mild anemia and his calcium and creatinine are within normal limits.  He has no evidence of endorgan damage other than his right clavicle pathologic fracture. Although nuclear med bone scan was unrevealing, CT of the abdomen pelvis did reveal multiple osseous lytic lesions highly suspicious for underlying multiple myeloma.  Patient wishes to pursue treatment, although given his underlying Parkinson's and advanced age does not wish to pursue fully aggressive chemotherapy.  Patient initiated low-dose 10 mg Revlimid daily at the beginning of November 2020.  Proceed with Zometa today.  Return to clinic in 4 weeks with repeat laboratory, further evaluation, and continuation of treatment.   2.  Right clavicle fracture: Pathologic.  Resolved.  Patient reports no further interventions are needed by orthopedics. 3.  Hypocalcemia: Chronic and unchanged.  Okay to proceed with Zometa as above. 4.  Anemia: Chronic and unchanged.  Hemoglobin is 10.7 today. 5.  Thrombocytopenia: Slightly improved.  Possibly secondary to Revlimid.  Proceed with treatment as above. 6.  Hypotension: Patient will receive 1 L IV fluids today.  Patient expressed understanding and was in  agreement with this plan. He also understands that He can call clinic at any time with any questions, concerns, or complaints.    Lloyd Huger, MD   08/06/2019 2:10 PM

## 2019-08-06 ENCOUNTER — Inpatient Hospital Stay (HOSPITAL_BASED_OUTPATIENT_CLINIC_OR_DEPARTMENT_OTHER): Payer: Medicare Other | Admitting: Oncology

## 2019-08-06 ENCOUNTER — Inpatient Hospital Stay: Payer: Medicare Other | Attending: Oncology

## 2019-08-06 ENCOUNTER — Encounter: Payer: Self-pay | Admitting: Oncology

## 2019-08-06 ENCOUNTER — Other Ambulatory Visit: Payer: Self-pay

## 2019-08-06 ENCOUNTER — Inpatient Hospital Stay: Payer: Medicare Other

## 2019-08-06 VITALS — BP 82/60 | HR 75 | Temp 97.8°F | Resp 18 | Wt 194.8 lb

## 2019-08-06 DIAGNOSIS — I10 Essential (primary) hypertension: Secondary | ICD-10-CM | POA: Diagnosis not present

## 2019-08-06 DIAGNOSIS — Z7951 Long term (current) use of inhaled steroids: Secondary | ICD-10-CM | POA: Diagnosis not present

## 2019-08-06 DIAGNOSIS — D649 Anemia, unspecified: Secondary | ICD-10-CM | POA: Insufficient documentation

## 2019-08-06 DIAGNOSIS — Z791 Long term (current) use of non-steroidal anti-inflammatories (NSAID): Secondary | ICD-10-CM | POA: Diagnosis not present

## 2019-08-06 DIAGNOSIS — Z79899 Other long term (current) drug therapy: Secondary | ICD-10-CM | POA: Insufficient documentation

## 2019-08-06 DIAGNOSIS — G20 Parkinson's disease: Secondary | ICD-10-CM | POA: Diagnosis not present

## 2019-08-06 DIAGNOSIS — C9000 Multiple myeloma not having achieved remission: Secondary | ICD-10-CM

## 2019-08-06 DIAGNOSIS — J449 Chronic obstructive pulmonary disease, unspecified: Secondary | ICD-10-CM | POA: Diagnosis not present

## 2019-08-06 DIAGNOSIS — E785 Hyperlipidemia, unspecified: Secondary | ICD-10-CM | POA: Diagnosis not present

## 2019-08-06 DIAGNOSIS — Z7982 Long term (current) use of aspirin: Secondary | ICD-10-CM | POA: Diagnosis not present

## 2019-08-06 LAB — COMPREHENSIVE METABOLIC PANEL
ALT: 10 U/L (ref 0–44)
AST: 29 U/L (ref 15–41)
Albumin: 3.5 g/dL (ref 3.5–5.0)
Alkaline Phosphatase: 64 U/L (ref 38–126)
Anion gap: 10 (ref 5–15)
BUN: 15 mg/dL (ref 8–23)
CO2: 23 mmol/L (ref 22–32)
Calcium: 8.5 mg/dL — ABNORMAL LOW (ref 8.9–10.3)
Chloride: 102 mmol/L (ref 98–111)
Creatinine, Ser: 0.74 mg/dL (ref 0.61–1.24)
GFR calc Af Amer: >60 mL/min (ref >60)
GFR calc non Af Amer: >60 mL/min (ref >60)
Glucose, Bld: 143 mg/dL — ABNORMAL HIGH (ref 70–99)
Potassium: 3.5 mmol/L (ref 3.5–5.1)
Sodium: 135 mmol/L (ref 135–145)
Total Bilirubin: 0.6 mg/dL (ref 0.3–1.2)
Total Protein: 6.1 g/dL — ABNORMAL LOW (ref 6.5–8.1)

## 2019-08-06 LAB — CBC WITH DIFFERENTIAL/PLATELET
Abs Immature Granulocytes: 0.01 10*3/uL (ref 0.00–0.07)
Basophils Absolute: 0 10*3/uL (ref 0.0–0.1)
Basophils Relative: 1 %
Eosinophils Absolute: 0.3 10*3/uL (ref 0.0–0.5)
Eosinophils Relative: 11 %
HCT: 34.2 % — ABNORMAL LOW (ref 39.0–52.0)
Hemoglobin: 10.7 g/dL — ABNORMAL LOW (ref 13.0–17.0)
Immature Granulocytes: 0 %
Lymphocytes Relative: 46 %
Lymphs Abs: 1.4 10*3/uL (ref 0.7–4.0)
MCH: 29.1 pg (ref 26.0–34.0)
MCHC: 31.3 g/dL (ref 30.0–36.0)
MCV: 92.9 fL (ref 80.0–100.0)
Monocytes Absolute: 0.3 10*3/uL (ref 0.1–1.0)
Monocytes Relative: 9 %
Neutro Abs: 1 10*3/uL — ABNORMAL LOW (ref 1.7–7.7)
Neutrophils Relative %: 33 %
Platelets: 120 10*3/uL — ABNORMAL LOW (ref 150–400)
RBC: 3.68 MIL/uL — ABNORMAL LOW (ref 4.22–5.81)
RDW: 17.6 % — ABNORMAL HIGH (ref 11.5–15.5)
WBC: 3 10*3/uL — ABNORMAL LOW (ref 4.0–10.5)
nRBC: 0 % (ref 0.0–0.2)

## 2019-08-06 MED ORDER — ZOLEDRONIC ACID 4 MG/100ML IV SOLN
4.0000 mg | Freq: Once | INTRAVENOUS | Status: AC
  Administered 2019-08-06: 4 mg via INTRAVENOUS
  Filled 2019-08-06: qty 100

## 2019-08-06 MED ORDER — SODIUM CHLORIDE 0.9 % IV SOLN
Freq: Once | INTRAVENOUS | Status: AC
  Filled 2019-08-06: qty 250

## 2019-08-07 LAB — KAPPA/LAMBDA LIGHT CHAINS
Kappa free light chain: 488 mg/L — ABNORMAL HIGH (ref 3.3–19.4)
Kappa, lambda light chain ratio: 31.9 — ABNORMAL HIGH (ref 0.26–1.65)
Lambda free light chains: 15.3 mg/L (ref 5.7–26.3)

## 2019-08-07 LAB — IGG, IGA, IGM
IgA: 96 mg/dL (ref 61–437)
IgG (Immunoglobin G), Serum: 704 mg/dL (ref 603–1613)
IgM (Immunoglobulin M), Srm: 45 mg/dL (ref 15–143)

## 2019-08-09 ENCOUNTER — Other Ambulatory Visit: Payer: Self-pay

## 2019-08-09 ENCOUNTER — Ambulatory Visit (INDEPENDENT_AMBULATORY_CARE_PROVIDER_SITE_OTHER): Payer: Medicare Other | Admitting: Podiatry

## 2019-08-09 ENCOUNTER — Encounter: Payer: Self-pay | Admitting: Podiatry

## 2019-08-09 DIAGNOSIS — M79674 Pain in right toe(s): Secondary | ICD-10-CM

## 2019-08-09 DIAGNOSIS — B351 Tinea unguium: Secondary | ICD-10-CM

## 2019-08-09 DIAGNOSIS — M79675 Pain in left toe(s): Secondary | ICD-10-CM

## 2019-08-09 DIAGNOSIS — I890 Lymphedema, not elsewhere classified: Secondary | ICD-10-CM

## 2019-08-09 DIAGNOSIS — L608 Other nail disorders: Secondary | ICD-10-CM

## 2019-08-09 NOTE — Progress Notes (Signed)
Complaint:  Visit Type: Patient presents  to my office for  preventative foot care services. Complaint: Patient states" my nails have grown long and thick and become painful to walk and wear shoes" Patient has been diagnosed with Parkinson.. The patient presents for preventative foot care services.  Podiatric Exam: Vascular: dorsalis pedis and posterior tibial pulses are not  palpable bilateral  due to lymphedema.. Capillary return is immediate. Temperature gradient is WNL. Skin turgor WNL  Sensorium: Normal Semmes Weinstein monofilament test. Normal tactile sensation bilaterally. Nail Exam: Pt has thick disfigured discolored nails with subungual debris noted bilateral entire nail hallux through fifth toenails.  Pincer nails noted. Ulcer Exam: There is no evidence of ulcer or pre-ulcerative changes or infection. Orthopedic Exam: Muscle tone and strength are WNL. No limitations in general ROM. No crepitus or effusions noted. Foot type and digits show no abnormalities. Bony prominences are unremarkable. Skin: No Porokeratosis. No infection or ulcers  Diagnosis:  Onychomycosis, , Pain in right toe, pain in left toes  Treatment & Plan Procedures and Treatment: Consent by patient was obtained for treatment procedures.   Debridement of mycotic and hypertrophic toenails, 1 through 5 bilateral and clearing of subungual debris. No ulceration, no infection noted.  Return Visit-Office Procedure: Patient instructed to return to the office for a follow up visit 3 months for continued evaluation and treatment.    Gardiner Barefoot DPM

## 2019-08-14 ENCOUNTER — Other Ambulatory Visit: Payer: Self-pay | Admitting: *Deleted

## 2019-08-14 DIAGNOSIS — M6281 Muscle weakness (generalized): Secondary | ICD-10-CM | POA: Diagnosis not present

## 2019-08-14 DIAGNOSIS — M545 Low back pain: Secondary | ICD-10-CM | POA: Diagnosis not present

## 2019-08-14 DIAGNOSIS — M25511 Pain in right shoulder: Secondary | ICD-10-CM | POA: Diagnosis not present

## 2019-08-14 DIAGNOSIS — R2689 Other abnormalities of gait and mobility: Secondary | ICD-10-CM | POA: Diagnosis not present

## 2019-08-14 DIAGNOSIS — R279 Unspecified lack of coordination: Secondary | ICD-10-CM | POA: Diagnosis not present

## 2019-08-14 DIAGNOSIS — S42024D Nondisplaced fracture of shaft of right clavicle, subsequent encounter for fracture with routine healing: Secondary | ICD-10-CM | POA: Diagnosis not present

## 2019-08-14 DIAGNOSIS — C9000 Multiple myeloma not having achieved remission: Secondary | ICD-10-CM

## 2019-08-14 DIAGNOSIS — S42001S Fracture of unspecified part of right clavicle, sequela: Secondary | ICD-10-CM | POA: Diagnosis not present

## 2019-08-14 DIAGNOSIS — G20 Parkinson's disease: Secondary | ICD-10-CM | POA: Diagnosis not present

## 2019-08-14 MED ORDER — LENALIDOMIDE 10 MG PO CAPS: 10.0000 mg | ORAL_CAPSULE | Freq: Every day | ORAL | 0 refills | Status: DC

## 2019-08-16 ENCOUNTER — Telehealth: Payer: Self-pay

## 2019-08-16 DIAGNOSIS — M25511 Pain in right shoulder: Secondary | ICD-10-CM | POA: Diagnosis not present

## 2019-08-16 DIAGNOSIS — G20 Parkinson's disease: Secondary | ICD-10-CM | POA: Diagnosis not present

## 2019-08-16 DIAGNOSIS — S42024D Nondisplaced fracture of shaft of right clavicle, subsequent encounter for fracture with routine healing: Secondary | ICD-10-CM | POA: Diagnosis not present

## 2019-08-16 DIAGNOSIS — S42001S Fracture of unspecified part of right clavicle, sequela: Secondary | ICD-10-CM | POA: Diagnosis not present

## 2019-08-16 DIAGNOSIS — M6281 Muscle weakness (generalized): Secondary | ICD-10-CM | POA: Diagnosis not present

## 2019-08-16 DIAGNOSIS — R2689 Other abnormalities of gait and mobility: Secondary | ICD-10-CM | POA: Diagnosis not present

## 2019-08-16 NOTE — Telephone Encounter (Signed)
SW contacted patient to schedule palliative care visit. Patient requested call back on Monday as he is waiting to hear back about his COVID-19 vaccine first. SW agreed and will follow back up.

## 2019-08-20 ENCOUNTER — Telehealth: Payer: Self-pay

## 2019-08-20 NOTE — Telephone Encounter (Signed)
Telephone call to patient to schedule palliative care visit with patient. Patient/family in agreement with home visit on 08-23-19 at 10:30AM.

## 2019-08-20 NOTE — Telephone Encounter (Signed)
Noted will address during appointment tomorrow.

## 2019-08-20 NOTE — Telephone Encounter (Signed)
Patient daughter in law called stating that patient had a sharp stabbing pain in his foot about two weeks ago and had pain in his other foot and had a fall and is unsure if this could be related to parkinson.  Scheduled a mychart apt for tomorrow with MD

## 2019-08-21 ENCOUNTER — Encounter: Payer: Self-pay | Admitting: Neurology

## 2019-08-21 ENCOUNTER — Telehealth (INDEPENDENT_AMBULATORY_CARE_PROVIDER_SITE_OTHER): Payer: Medicare Other | Admitting: Neurology

## 2019-08-21 DIAGNOSIS — G20 Parkinson's disease: Secondary | ICD-10-CM

## 2019-08-21 MED ORDER — RYTARY 48.75-195 MG PO CPCR: 195.0000 mg | ORAL_CAPSULE | Freq: Every day | ORAL | 3 refills | Status: DC

## 2019-08-21 NOTE — Patient Instructions (Signed)
Given verbally, during today's virtual video-based encounter, with verbal feedback received.   

## 2019-08-21 NOTE — Progress Notes (Signed)
Interim history:  [name] is a very pleasant 86 year old left-handed gentleman with an underlying medical history of degenerative spine disease, allergies, BPH, hypertension, arthritis, reflux disease, and insomnia, who presents for a virtual visit via [place] video visit for follow-up consultation of his Parkinson's disease. The patient is accompanied by his daughter-in-law, Adam Moss, today and joins via laptop from his residence, I am located in my office.  He requested a sooner than scheduled appointment for foot pain.  I last saw him on 04/18/2019, at which time he reported interim stress, he was not sleeping very well, he had an increase in his tremor on the left side.  He was noted to have edema.  He was hospitalized for this and was noted to have lymphedema.  He was noted to have several bony lytic lesions and had a pathological fracture of his right clavicle.  He was eventually diagnosed with multiple myeloma.  He is being followed by oncology for this.  He was advised to start taking melatonin at night for sleep and continue with his Parkinson's medication.     Today, 08/21/2019: Please also see below for virtual visit documentation.      The patient's allergies, current medications, family history, past medical history, past social history, past surgical history and problem list were reviewed and updated as appropriate.    Previously (copied from previous notes for reference):    Of note, he missed an appointment on 02/19/2019.  I saw him on 08/21/2018, at which time he reported feeling fairly stable, with the exception of increase in tremor noted.  He was on Rytary 4 times a day about 6 hourly.  He was advised to try to increase the Rytary 195 mg strength to 1 pill 5 times a day, about 5 hourly.     02/15/2018, at which time he reported feeling stable. He had occasional issues with constipation and had some mild forgetfulness, otherwise was doing well. I suggested he continue with his  Rytary.    I saw him on 08/18/2017, at which time he felt stable. We continued with his Rytary, 195 mg qid.      I saw him on 04/18/17, at which time he reported worsening tremor, he was having some difficulty with fine motor control. He was taking Rytary 4 times a day and at bedtime. He was trying to keep set schedule for his bedtime routine and wake up routine and medication regimen. I suggested he change the timing of his medication to 1 pill at 6, 11, 4 PM and 10 PM. We talked about potentially increasing the medication to 1 pill 5 times a day.   I saw him on 10/13/2016, at which time he reported doing okay. He had occasional muscle pain in different areas of his lower back and also thighs, sometimes around the ankles. He was trying to walk on a regular basis. Memory mood were stable. Sadly, his wife passed away in 06/17/16. I suggested, we keep his meds the same.     We had to cancel an appointment for 06/08/2016 and he missed an appointment on 07/21/2016. I saw him on 02/04/2016, at which time he reported doing okay, tolerating Rytary 195 mg qid without significant side effects. He was not always exercising regularly. He does visit his wife in the afternoon every day. He was not always hydrating well enough. He had gained some weight. He had no recent falls, no complaints of hallucinations or memory issues, occasional depressed mood but no sustained  symptoms of depression. I suggested we continue with his medication regimen. He was encouraged to hydrate better and exercise on a more regular basis.   I saw him on 09/24/2015, at which time he reported doing a little better, able to tolerate the Rytary qid. Thankfully, h had no recent falls. He was not sleeping very well at night. He would visit his wife in memory care every day. His 3 children with visit about once a month. Is trying to hydrate well. He had noticed some ankle swelling and residual knee pain bilaterally. He was able to pursue his  hobby of building model ships. I suggested he continue with Rytary at the current dose. He was encouraged to try melatonin for sleep.    I saw him on 05/26/2015, at which time he reported doing somewhat better with the new medication, Rytary, with improvement noted in fine motor skills and tremors. He had recent blood work through his primary care physician which I reviewed at the time: Lipid profile was unremarkable with the exception of triglycerides borderline at 155, BMP was normal and CBC was normal. He reported that his leg swelling was a little better. He was avoiding nighttime driving. He was visiting his wife and memory care regularly. He was able to tolerate the new medication which was really reassuring. She was taking Rytary 195 mg, one pill at 9 AM, 1 pill at 5 PM, 1 pill at bedtime which is usually around midnight. He had gained about 10 pounds and was trying to lose weight. He worried about his weight gain but did admit to having good food and lots of choices for dessert at his assisted living place. I suggested a gentle increase in his medication to 1 pill 4 times a day.   I first met him on 02/21/2015 at the request of his primary neurologist, at which time the patient reported a history of left-sided tremor, fine motor dyscontrol, and gait difficulties. Symptoms dated back to 2-3 years prior. He had multiple medication intolerances particularly issues with GI side effects. I suggested a trial of Rytary, 95 mg strength with titration to 2 pills 3 times a day. I also suggested we proceed with a DaT scan and I referred him to [facility]. He had the study on 03/19/2015 which showed abnormal image grade 1: Asymmetric uptake with normal or almost normal putamen activity in one hemisphere and with a more marked reduction in the contralateral putamen. This pattern of activity can be seen with Parkinson's disease or related syndromes.    Of note, the  radiotracer uptake was decreased on the right. We called the patient with the test results. The patient emailed me in August reporting a slight decrease in his tremor with a new medication and ability to tolerated thus far. He asked for change and pill strength of possible because the medication is very expensive. I changed him to 195 mg strength one pill 3 times a day.    02/21/2015: He has a history of left-sided tremor, fine motor dyscontrol, and gait difficulty. 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 Symptoms date back to 2014 or the year before, when he started having difficulty getting out of a chair, tremors on the left side, slowness, and he started seeing you at the neuroscience center in [facility]. He was diagnosed with Parkinson's disease and  tried on different medications but had side effects, primarily GI related side effects. He has a history of cholecystectomy. Sinemet caused severe abdominal pain and nausea. He was tried on Neupro which also caused stomach pain, and on Azilect low-dose he did not think he had any response. He has been on amantadine. His previous records were reviewed: This includes neurologic office notes from 01/10/2015, 11/08/2014, 09/09/2014, 08/19/2014, 05/20/2014, 02/18/2014, 11/30/2013, 07/06/2013, 06/01/2013, and 02/23/2013. He had a brain MRI years ago but results are not available for my review. He is currently on amantadine 100 mg twice daily. In 2015 he was tried on pramipexole but had severe GI upset. In July 2015 he reported that he had improvement with Neupro but stomach pain. In January 2016 he tried Sinemet but had to stop after a few days only. Earlier this year his Azilect was discontinued. He has no family history of Parkinson's disease. He reports mild memory loss and mild sleep issues. He currently resides in independent living at the [facility]  [place]. This is in [place]. His wife is a Marine scientist care. He drives. He has no recent history of falls. He tries to stay active physically. He has right knee pain and wears a soft brace around his right knee. He has a remote history of injury to his right knee. He has been off of amantadine for the past 2 weeks or so. He had significant blurry vision while on it to the point where he had to see an ophthalmologist. His blurry vision has improved since he stopped the amantadine. He feels that his symptoms have been slowly progressive. He is primarily bothered by his fine motor dyscontrol and tremor. He drives without significant problems but does not drive long distances.    His Past Medical History Is Significant For: Past Medical History:  Diagnosis Date  . Cancer ([place])    skin  . COPD (chronic obstructive pulmonary disease) ([place])   . Hyperlipemia   . Hypertension   . Lymphedema of both lower extremities   . Parkinson disease ([place])   . Parkinsonism ([place]) 02/21/2015  . Spinal stenosis   . Tremor     His Past Surgical History Is Significant For: Past Surgical History:  Procedure Laterality Date  . APPENDECTOMY    . CATARACT EXTRACTION    . CHOLECYSTECTOMY    . TONSILLECTOMY    . VASECTOMY      His Family History Is Significant For: Family History  Problem Relation Age of Onset  . Cancer Mother   . Heart disease Father     His Social History Is Significant For: Social History   Socioeconomic History  . Marital status: Married    Spouse name: Not on file  . Number of children: 3  . Years of education: PhD  . Highest education level: Not on file  Occupational History  . Occupation: Retired  Tobacco Use  . Smoking status: Never Smoker  . Smokeless tobacco: Never Used  Substance and Sexual Activity  . Alcohol use: No    Alcohol/week: 0.0 standard drinks  . Drug use: No  . Sexual activity: Not Currently  Other Topics Concern  . Not on file  Social History Narrative     Denies caffeine use    Social Determinants of Health   Financial Resource Strain:   . Difficulty of Paying Living Expenses: Not on file  Food Insecurity:   . Worried About Charity fundraiser in the Last Year: Not on file  .  Ran Out of Food in the Last Year: Not on file  Transportation Needs:   . Lack of Transportation (Medical): Not on file  . Lack of Transportation (Non-Medical): Not on file  Physical Activity:   . Days of Exercise per Week: Not on file  . Minutes of Exercise per Session: Not on file  Stress:   . Feeling of Stress : Not on file  Social Connections:   . Frequency of Communication with Friends and Family: Not on file  . Frequency of Social Gatherings with Friends and Family: Not on file  . Attends Religious Services: Not on file  . Active Member of Clubs or Organizations: Not on file  . Attends Archivist Meetings: Not on file  . Marital Status: Not on file    His Allergies Are:  Allergies  Allergen Reactions  . Carbidopa-Levodopa Other (See Comments)    Severe stomach pains, can take rytary  . Oxycodone-Acetaminophen Other (See Comments)    "boils on skin"  . Tyloxapol   . Acetaminophen Rash, Nausea And Vomiting and Hives  . Pramipexole Nausea Only  :   His Current Medications Are:  Outpatient Encounter Medications as of 08/21/2019  Medication Sig  . aspirin 81 MG tablet Take 81 mg by mouth daily.  . Calcium Carbonate-Vitamin D (OYSTER SHELL CALCIUM/D) 250-125 MG-UNIT TABS Take by mouth.  . Calcium Citrate 250 MG TABS Take 1 tablet by mouth daily.   . Carbidopa-Levodopa ER (RYTARY) 48.75-195 MG CPCR Take 195 mg by mouth 5 (five) times daily.  . cetirizine (ZYRTEC) 10 MG tablet Take 10 mg by mouth daily.  . cyanocobalamin (,VITAMIN B-12,) 1000 MCG/ML injection   . finasteride (PROSCAR) 5 MG tablet Take 5 mg by mouth daily.  . fluticasone (FLONASE) 50 MCG/ACT nasal spray Place into both nostrils daily.  . fluticasone furoate-vilanterol  (BREO ELLIPTA) 100-25 MCG/INH AEPB USE 1 INHALATION ORALLY    DAILY  . furosemide (LASIX) 20 MG tablet Take 1 tablet by mouth daily.  Marland Kitchen ipratropium (ATROVENT) 0.03 % nasal spray Place 0.03 mLs into both nostrils 3 (three) times daily as needed.  Marland Kitchen lenalidomide (REVLIMID) 10 MG capsule Take 1 capsule (10 mg total) by mouth daily.  . meloxicam (MOBIC) 15 MG tablet Take 15 mg by mouth daily.  . methocarbamol (ROBAXIN) 500 MG tablet methocarbamol 500 mg tablet  TAKE 1 TABLET BY MOUTH EVERY DAY  . niacin 500 MG tablet Take 500 mg by mouth every morning.   . olmesartan (BENICAR) 20 MG tablet Take 20 mg by mouth daily.  Marland Kitchen omeprazole (PRILOSEC) 40 MG capsule Take 40 mg by mouth daily.  Marland Kitchen PROAIR HFA 108 (90 Base) MCG/ACT inhaler INHALE 2 PUFFS INTO THE LUNGS AS NEEDED. (Patient not taking: Reported on 08/06/2019)  . Simethicone (GAS RELIEF PO) Take 1 Dose by mouth as needed.   . simvastatin (ZOCOR) 10 MG tablet Take 10 mg by mouth daily.  . tamsulosin (FLOMAX) 0.4 MG CAPS capsule Take 0.4 mg by mouth daily.   . vitamin C (ASCORBIC ACID) 500 MG tablet Take 500 mg by mouth daily.   No facility-administered encounter medications on file as of 08/21/2019.  :  Review of Systems:  Out of a complete 14 point review of systems, all are reviewed and negative with the exception of these symptoms as listed below:  Virtual Visit via Video Note on 08/21/2019:   I connected with Mr. Armijo and Adam Moss on 08/21/19 at 10:30 AM EST by a video enabled  telemedicine application and verified that I am speaking with the correct person using two identifiers.   I discussed the limitations of evaluation and management by telemedicine and the availability of in person appointments. The patient expressed understanding and agreed to proceed.  History of Present Illness:  He reports sudden onset of right foot pain a couple of weeks ago.  It was severe, lasted about a day.  He has had problems with his right knee off and on for  quite some time, has ongoing issues with bilateral leg edema and lymphedema.  He has recently fallen.  He developed a similar pain on the left side in his foot about a week later after the right foot hurt.  He was walking with his rolling walker but was putting more pressure on the right leg because the left foot was hurting so much and his right knee gave out.  He fell to the ground, semicontrolled fall.  He could not get up and had to call for help through his call alert button that he has around his neck.  He was checked out by the nurse, he was helped up and had did not have any sustained pain.  His left foot eventually stopped hurting.  He has taken tramadol as needed which he had leftover from his clavicle fracture.  He had a follow-up appointment with orthopedics about the clavicle, it is healing, not fully aligned and per Adam Moss he had follow-up x-rays.  He is going to schedule an appointment with his primary care physician, he also has an appointment pending with his oncologist.  He is not drinking liquids enough, he was dehydrated recently and was given IV fluids when he went in for an infusion treatment to his oncologist's office.  Appetite is also not as good, he reports changes in his taste.  Observations/Objective:  Frail appearing, no acute distress. HEENT exam shows no obvious change, hearing impaired.  Hypophonia noted, no obvious dysarthria. Mild LUE resting tremor. In recliner .   Assessment and Plan: In summary, Rhone Ozaki a very pleasant 86 year old malewith anunderlying medical history of degenerative spine disease, allergies, BPH, arthritis, reflux disease, hypertension, Lymphedema, multiple myeloma, history of pathological fracture of the right clavicle, right knee pain,who presents for A video based follow-up appointment via MyChart video for a sooner than scheduled appointment because of bilateral foot pain.  It comes and goes, started acutely a couple of weeks ago.  He  has not noticed any obvious changes, no obvious new swelling, But has chronic leg edema from lymphedema.  I explained to him that it is difficult to say what caused this pain.  Parkinson's disease may cause discomfort from time to time but it is often chronic and progressive.  It could be a flareup of his arthritis, also neuropathy is a possibility in the context of multiple myeloma.  Nevertheless, for now, I suggested no change in his medication, we could consider gabapentin as needed.  I am reluctant to add any more medication at this time.  He is able to take rytary 5 times a day.  I renewed his prescription.We talked about the importance of fall prevention.  He is going to make a follow-up appointment with his primary care physician and has an appointment with his oncologist pending.He is reminded to hydrate well with water.  He can keep his follow-up appointment for March as scheduled. His daughter-in-law inquired about the use of CBD oil.  Unfortunately, I do not have experience with this and  do not know for sure if it would help or if it may cause side effects or interactions with his current medications.  I have not heard about any major side effects in patients who tried CBD oil.  She indicated that she may have him try some. I answered all their questions today and the patient and his daughter-in-law were in agreement.   Follow Up Instructions:  Keep appt in March.   I discussed the assessment and treatment plan with the patient. The patient was provided an opportunity to ask questions and all were answered. The patient agreed with the plan and demonstrated an understanding of the instructions.   The patient was advised to call back or seek an in-person evaluation if the symptoms worsen or if the condition fails to improve as anticipated.  I provided 30 minutes of non-face-to-face time during this encounter.   Star Age, MD

## 2019-08-23 ENCOUNTER — Other Ambulatory Visit: Payer: Self-pay

## 2019-08-23 ENCOUNTER — Other Ambulatory Visit: Payer: Medicare Other

## 2019-08-23 DIAGNOSIS — Z515 Encounter for palliative care: Secondary | ICD-10-CM

## 2019-08-23 NOTE — Progress Notes (Signed)
PATIENT NAME: Adam Moss DOB: [birth year] MRN: [id number]  PRIMARY CARE PROVIDER: Perrin Maltese, MD  RESPONSIBLE PARTY:  Acct ID - Guarantor Home Phone Work Phone Relationship Acct Type  [id number] [name]  Self P/F     [name], [address], [place], [place] [address]    PLAN OF CARE and INTERVENTIONS:               1.  GOALS OF CARE/ ADVANCE CARE PLANNING:  Patient wants to remain in his aparetment at [facility] and continue with oral chemo.                2.  PATIENT/CAREGIVER EDUCATION:  Education on s/s of infecton, education on s/s of infection, education to patient to keep LE's elevated to reduce edema, reviewed meds, support               3.  DISEASE STATUS: SW and RN made scheduled skilled nursing visit. Patient sitting in his recliner in apartment. Patient is a 86 year old patient of Dr. Gary Moss at the [facility] Patient alert and oriented. Patients son Adam Moss and his wife Adam Moss live in [place] and are supportive to patient. Patient currently on a oral chemo drug and receives Zometa monthly. Patient reports feeling weak and has lymphedema in lower extremities left greater than the right. Patient's past medical history includes Parkinson's affecting left side, resting tremors, weakness, COPD, hyperlipidemia, hypertension, lymphedema and spinal stenosis. Patient denies having any pain at he present time. Patient reports he has received his first covid-19 vaccine.  Patient lives alone and reports his wife died three years ago. Patient reports his appetite is okay. Patient admits to having some shortness of breath on exertion and a non-productive cough. Patient reports he sleeps well some nights and other nights is awake. Patient reports insomnia is a side effect of oral chemo Patient suffered a fall on Saturday in hall outside his apartment . Patient reports he feels he was out sitting at table eating dinner and sit too long. Patient reports burning in his feet and when  he stood up reports his right knee gave away and he sit down in the floor and fell backwards. Facility staff assisted patient with getting up and facility nurse assessed patient. Patient did not suffer any injuries. Patient denies having any open areas of skin breakdown. Patient reports he sleeps in his recliner and would like to be able to sleep on the sofa again. Nurse talked with patient about considering hospital bed however patient is reluctant to do so at the present time. Patient is a retired Personal assistant. Patient in agreement with palliative care services. Patient informed palliative care would visit at least monthly and PRN. Palliative care folder left in home with patient. Patient encouraged to contact palliative care with questions or concerns.     HISTORY OF PRESENT ILLNESS:    CODE STATUS: DNR  ADVANCED DIRECTIVES: Y MOST FORM: No PPS: 50%   PHYSICAL EXAM:   VITALS: Today's Vitals   08/23/19 1110  BP: 104/76  Pulse: 75  Resp: 18  Temp: 98.3 F (36.8 C)  TempSrc: Temporal  SpO2: 98%  Weight: 194 lb (88 kg)  Height: 5\' 11"  (1.803 m)  PainSc: 0-No pain    LUNGS: decreased breath sounds CARDIAC: Cor RRR  EXTREMITIES: 4+ and pitting edema SKIN: patient denies having any open areas of skin breakdown.  NEURO: Alert and oriented x 4       Adam Simmer, RN

## 2019-08-23 NOTE — Progress Notes (Signed)
COMMUNITY PALLIATIVE CARE SW NOTE  PATIENT NAME: Adam Moss DOB: Mar 18, 1934 MRN: [id number]  PRIMARY CARE PROVIDER: Perrin Maltese, MD  RESPONSIBLE PARTY:  Acct ID - Guarantor Home Phone Work Phone Relationship Acct Type  [id number] [name]  Self P/F     [address], [address], [place], [place] [address]     PLAN OF CARE and INTERVENTIONS:             1. GOALS OF CARE/ ADVANCE CARE PLANNING:  Patient is DNR. HCPOA is [name] (patient's son). Living Will is complete. Goal is to remain as independent as he can.  2. SOCIAL/EMOTIONAL/SPIRITUAL ASSESSMENT/ INTERVENTIONS:  SW and RN met with patient in his apartment. Patient said he is not doing "great". Patient provided brief medical history. Patient denies pain. Patient has swelling in his legs and feet that make it hard for him to walk. Patient reports a fall on 1/23, no injuries. Patient has an occasional cough and noted some SOB at times. Patient is sleeping in the recliner and said he does not sleep well most nights. 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 Patient is a widower, patient's wife passed away three years ago. Patient has one son in [place] and one daughter in [place], [place]. Patient said he had another son but he died by suicide last year. 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 Patient also enjoys building models of boats and trains. SW provided education on palliative care, discussed goals of care and provided emotional support.  3. PATIENT/CAREGIVER EDUCATION/ COPING:  Patient was alert, oriented. Patient openly expresses feelings with team. Patient was pleasant. Patient is frustrated that he cannot do as he used to do. Family is supportive.  4. PERSONAL EMERGENCY PLAN:  Facility will follow protocol. 5. COMMUNITY RESOURCES COORDINATION/ HEALTH CARE NAVIGATION:  Patient manages  his care with support of family. Patient has a follow-up for labs and infusion at cancer center on 2/8. Patient has a new urology appointment on 2/24.  6. FINANCIAL/LEGAL CONCERNS/INTERVENTIONS:  None.     SOCIAL HX:  Social History   Tobacco Use  . Smoking status: Never Smoker  . Smokeless tobacco: Never Used  Substance Use Topics  . Alcohol use: No    Alcohol/week: 0.0 standard drinks    CODE STATUS:   Code Status: Prior (DNR) ADVANCED DIRECTIVES: Y - requested copy.  MOST FORM COMPLETE:  No. HOSPICE EDUCATION PROVIDED: None.  PPS: Patient is mostly independent of ADLs. Patient is ambulating with his walker but is having more difficulty.  I spent 45 minutes with patient/family, from 10:30-11:15a providing education, support and consultation.   Margaretmary [place], LCSW

## 2019-08-24 ENCOUNTER — Telehealth: Payer: Self-pay

## 2019-08-24 NOTE — Telephone Encounter (Signed)
SW received a call from [name] (patient's daughter-in-law) to discuss visit with patient. [name] provided brief overview of the situation and discussed concerns. [name] said that her main concern is patient's coping and motivation. SW provided emotional support, validated Wendy's concerns and discussed palliative care. SW will follow-up within two weeks for counseling support at [facility] request. SW provided contact information for any additional questions.

## 2019-08-30 ENCOUNTER — Ambulatory Visit: Payer: Self-pay | Admitting: Urology

## 2019-08-31 NOTE — Progress Notes (Signed)
[place]  [phone] [phone]  ID: Mauri Pole OB: 11/23/1933  MR#: [id number]  NIO#:[id number]  Patient Care Team: Perrin Maltese, MD as PCP - General (Internal Medicine)   CHIEF COMPLAINT: Kappa light chain myeloma.   INTERVAL HISTORY: Patient returns to clinic today for repeat laboratory work, further evaluation, and continuation of Zometa.  He continues to tolerate Revlimid well without significant side effects.  He had separate episodes of foot pain lasting approximately 24 hours and then resolving without intervention.  This has not recurred.  He otherwise has felt well.  He has a fair appetite, and admits to decreased fluid intake.  He has a resting tremor secondary to his Parkinson's, but no other neurologic complaints.  He denies any recent fevers or illnesses.  He has no chest pain, shortness of breath, cough, or hemoptysis.  He denies any nausea, vomiting, constipation, or diarrhea.  He has no urinary complaints.  Patient offers no further specific complaints today.  REVIEW OF SYSTEMS:   Review of Systems  Constitutional: Positive for malaise/fatigue. Negative for fever and weight loss.  Respiratory: Negative.  Negative for cough, hemoptysis and shortness of breath.   Cardiovascular: Negative.  Negative for chest pain and leg swelling.  Gastrointestinal: Negative.  Negative for abdominal pain.  Genitourinary: Negative.  Negative for dysuria.  Musculoskeletal: Negative.  Negative for back pain.  Skin: Negative.  Negative for rash.  Neurological: Positive for tremors and weakness. Negative for dizziness, focal weakness and headaches.  Psychiatric/Behavioral: Negative.  The patient is not nervous/anxious.     As per HPI. Otherwise, a complete review of systems is negative.  PAST MEDICAL HISTORY: Past Medical History:  Diagnosis Date  . Cancer ([place])    skin  . COPD (chronic obstructive pulmonary disease) ([place])   .  Hyperlipemia   . Hypertension   . Lymphedema of both lower extremities   . Parkinson disease ([place])   . Parkinsonism ([place]) 02/21/2015  . Spinal stenosis   . Tremor     PAST SURGICAL HISTORY: Past Surgical History:  Procedure Laterality Date  . APPENDECTOMY    . CATARACT EXTRACTION    . CHOLECYSTECTOMY    . TONSILLECTOMY    . VASECTOMY      FAMILY HISTORY: Family History  Problem Relation Age of Onset  . Cancer Mother   . Heart disease Father     ADVANCED DIRECTIVES (Y/N):  N  HEALTH MAINTENANCE: Social History   Tobacco Use  . Smoking status: Never Smoker  . Smokeless tobacco: Never Used  Substance Use Topics  . Alcohol use: No    Alcohol/week: 0.0 standard drinks  . Drug use: No     Colonoscopy:  PAP:  Bone density:  Lipid panel:  Allergies  Allergen Reactions  . Carbidopa-Levodopa Other (See Comments)    Severe stomach pains, can take rytary  . Oxycodone-Acetaminophen Other (See Comments)    "boils on skin"  . Tyloxapol   . Acetaminophen Rash, Nausea And Vomiting and Hives  . Pramipexole Nausea Only    Current Outpatient Medications  Medication Sig Dispense Refill  . Fluticasone-Umeclidin-Vilant (TRELEGY ELLIPTA) 100-62.5-25 MCG/INH AEPB Trelegy Ellipta 100-62.5-25 MCG/INH Inhalation Aerosol Powder Breath Activated QTY: 3 inhaler Days: 90 Refills: 3  Written: 08/02/19 Patient Instructions: inhale 1 puff by mouth once daily. Rinse mouth well after use.    Marland Kitchen aspirin 81 MG tablet Take 81 mg by mouth daily.    . Calcium Carbonate-Vitamin D (OYSTER SHELL CALCIUM/D) 250-125 MG-UNIT  TABS Take by mouth.    . Calcium Citrate 250 MG TABS Take 1 tablet by mouth daily.     . Carbidopa-Levodopa ER (RYTARY) 48.75-195 MG CPCR Take 195 mg by mouth 5 (five) times daily. 450 capsule 3  . cetirizine (ZYRTEC) 10 MG tablet Take 10 mg by mouth daily.    . cyanocobalamin (,VITAMIN B-12,) 1000 MCG/ML injection     . finasteride (PROSCAR) 5 MG tablet Take 5 mg by mouth daily.     . fluticasone (FLONASE) 50 MCG/ACT nasal spray Place into both nostrils daily.    . fluticasone furoate-vilanterol (BREO ELLIPTA) 100-25 MCG/INH AEPB USE 1 INHALATION ORALLY    DAILY 180 each 3  . furosemide (LASIX) 20 MG tablet Take 1 tablet by mouth daily.    Marland Kitchen ipratropium (ATROVENT) 0.03 % nasal spray Place 0.03 mLs into both nostrils 3 (three) times daily as needed.  11  . lenalidomide (REVLIMID) 10 MG capsule Take 1 capsule (10 mg total) by mouth daily. 28 capsule 0  . meloxicam (MOBIC) 15 MG tablet Take 15 mg by mouth daily.    . methocarbamol (ROBAXIN) 500 MG tablet methocarbamol 500 mg tablet  TAKE 1 TABLET BY MOUTH EVERY DAY    . niacin 500 MG tablet Take 500 mg by mouth every morning.     . olmesartan (BENICAR) 20 MG tablet Take 20 mg by mouth daily.    Marland Kitchen omeprazole (PRILOSEC) 40 MG capsule Take 40 mg by mouth daily.    Marland Kitchen PROAIR HFA 108 (90 Base) MCG/ACT inhaler INHALE 2 PUFFS INTO THE LUNGS AS NEEDED. (Patient not taking: Reported on 08/06/2019) 17 Inhaler 3  . Simethicone (GAS RELIEF PO) Take 1 Dose by mouth as needed.     . simvastatin (ZOCOR) 10 MG tablet Take 10 mg by mouth daily.    . tamsulosin (FLOMAX) 0.4 MG CAPS capsule Take 0.4 mg by mouth daily.     . vitamin C (ASCORBIC ACID) 500 MG tablet Take 500 mg by mouth daily.     No current facility-administered medications for this visit.    OBJECTIVE: Vitals:   09/03/19 1356  BP: 95/62  Pulse: 84  Resp: 16  Temp: (!) 97.4 F (36.3 C)  SpO2: 99%     Body mass index is 26.81 kg/m.    ECOG FS:1 - Symptomatic but completely ambulatory  General: Well-developed, well-nourished, no acute distress.  Sitting in a wheelchair. Eyes: Pink conjunctiva, anicteric sclera. HEENT: Normocephalic, moist mucous membranes. Lungs: No audible wheezing or coughing. Heart: Regular rate and rhythm. Abdomen: Soft, nontender, no obvious distention. Musculoskeletal: No edema, cyanosis, or clubbing. Neuro: Alert, answering all questions  appropriately. Cranial nerves grossly intact. Skin: No rashes or petechiae noted. Psych: Normal affect.  LAB RESULTS:  Lab Results  Component Value Date   NA 132 (L) 09/03/2019   K 3.5 09/03/2019   CL 101 09/03/2019   CO2 24 09/03/2019   GLUCOSE 138 (H) 09/03/2019   BUN 12 09/03/2019   CREATININE 0.74 09/03/2019   CALCIUM 8.1 (L) 09/03/2019   PROT 6.1 (L) 09/03/2019   ALBUMIN 3.3 (L) 09/03/2019   AST 29 09/03/2019   ALT 6 09/03/2019   ALKPHOS 79 09/03/2019   BILITOT 0.7 09/03/2019   GFRNONAA >60 09/03/2019   GFRAA >60 09/03/2019    Lab Results  Component Value Date   WBC 4.4 09/03/2019   NEUTROABS 2.2 09/03/2019   HGB 10.0 (L) 09/03/2019   HCT 32.7 (L) 09/03/2019   MCV 91.6  09/03/2019   PLT 186 09/03/2019     STUDIES: No results found.  ASSESSMENT: Kappa light chain myeloma.  PLAN:    1. Kappa light chain myeloma: Bone marrow biopsy on May 01, 2019 revealed increased plasma cells of 30 to 40% with kappa light chain restriction.  SPEP is negative and IgG, IgA, and IgM are essentially within normal limits.  Patient's kappa free light chain as well as her kappa/lambda light chain ratio have trended down to 488 and 31.9 respectively.  His hemoglobin has trended down slightly to 10.0.  He has no other evidence of endorgan damage other than his right clavicle pathologic fracture. Although nuclear med bone scan was unrevealing, CT of the abdomen pelvis did reveal multiple osseous lytic lesions highly suspicious for underlying multiple myeloma.  Patient wishes to pursue treatment, although given his underlying Parkinson's and advanced age does not wish to pursue fully aggressive chemotherapy.  Patient initiated low-dose 10 mg Revlimid daily at the beginning of November 2020.  Proceed with Zometa today.  Return to clinic in 4 weeks with repeat laboratory work, further evaluation, and continuation of treatment. 2.  Right clavicle fracture: Pathologic.  Resolved.  Patient reports  no further interventions are needed by orthopedics. 3.  Hypocalcemia: Chronic and unchanged.  Okay to proceed with Zometa as above. 4.  Anemia: Hemoglobin has trended down slightly to 10.0.  Monitor. 5.  Thrombocytopenia: Resolved. 6.  Hypotension: Slightly improved, monitor.  Patient expressed understanding and was in agreement with this plan. He also understands that He can call clinic at any time with any questions, concerns, or complaints.    Lloyd Huger, MD   09/03/2019 2:19 PM

## 2019-09-03 ENCOUNTER — Encounter: Payer: Self-pay | Admitting: Oncology

## 2019-09-03 ENCOUNTER — Other Ambulatory Visit: Payer: Self-pay

## 2019-09-03 ENCOUNTER — Inpatient Hospital Stay: Payer: Medicare Other

## 2019-09-03 ENCOUNTER — Ambulatory Visit: Payer: Federal, State, Local not specified - PPO

## 2019-09-03 ENCOUNTER — Ambulatory Visit: Payer: Federal, State, Local not specified - PPO | Admitting: Oncology

## 2019-09-03 ENCOUNTER — Inpatient Hospital Stay: Payer: Medicare Other | Attending: Oncology | Admitting: Oncology

## 2019-09-03 ENCOUNTER — Other Ambulatory Visit: Payer: Federal, State, Local not specified - PPO

## 2019-09-03 VITALS — BP 95/62 | HR 84 | Temp 97.4°F | Resp 16 | Wt 192.2 lb

## 2019-09-03 DIAGNOSIS — E785 Hyperlipidemia, unspecified: Secondary | ICD-10-CM | POA: Insufficient documentation

## 2019-09-03 DIAGNOSIS — K219 Gastro-esophageal reflux disease without esophagitis: Secondary | ICD-10-CM | POA: Insufficient documentation

## 2019-09-03 DIAGNOSIS — I959 Hypotension, unspecified: Secondary | ICD-10-CM | POA: Insufficient documentation

## 2019-09-03 DIAGNOSIS — Z791 Long term (current) use of non-steroidal anti-inflammatories (NSAID): Secondary | ICD-10-CM | POA: Insufficient documentation

## 2019-09-03 DIAGNOSIS — Z79899 Other long term (current) drug therapy: Secondary | ICD-10-CM | POA: Insufficient documentation

## 2019-09-03 DIAGNOSIS — D649 Anemia, unspecified: Secondary | ICD-10-CM | POA: Insufficient documentation

## 2019-09-03 DIAGNOSIS — G20 Parkinson's disease: Secondary | ICD-10-CM | POA: Insufficient documentation

## 2019-09-03 DIAGNOSIS — Z8249 Family history of ischemic heart disease and other diseases of the circulatory system: Secondary | ICD-10-CM | POA: Diagnosis not present

## 2019-09-03 DIAGNOSIS — C9000 Multiple myeloma not having achieved remission: Secondary | ICD-10-CM

## 2019-09-03 DIAGNOSIS — Z7951 Long term (current) use of inhaled steroids: Secondary | ICD-10-CM | POA: Insufficient documentation

## 2019-09-03 DIAGNOSIS — J449 Chronic obstructive pulmonary disease, unspecified: Secondary | ICD-10-CM | POA: Insufficient documentation

## 2019-09-03 DIAGNOSIS — M79673 Pain in unspecified foot: Secondary | ICD-10-CM | POA: Diagnosis not present

## 2019-09-03 DIAGNOSIS — R5383 Other fatigue: Secondary | ICD-10-CM | POA: Diagnosis not present

## 2019-09-03 DIAGNOSIS — Z7982 Long term (current) use of aspirin: Secondary | ICD-10-CM | POA: Insufficient documentation

## 2019-09-03 LAB — COMPREHENSIVE METABOLIC PANEL
ALT: 6 U/L (ref 0–44)
AST: 29 U/L (ref 15–41)
Albumin: 3.3 g/dL — ABNORMAL LOW (ref 3.5–5.0)
Alkaline Phosphatase: 79 U/L (ref 38–126)
Anion gap: 7 (ref 5–15)
BUN: 12 mg/dL (ref 8–23)
CO2: 24 mmol/L (ref 22–32)
Calcium: 8.1 mg/dL — ABNORMAL LOW (ref 8.9–10.3)
Chloride: 101 mmol/L (ref 98–111)
Creatinine, Ser: 0.74 mg/dL (ref 0.61–1.24)
GFR calc Af Amer: >60 mL/min (ref >60)
GFR calc non Af Amer: >60 mL/min (ref >60)
Glucose, Bld: 138 mg/dL — ABNORMAL HIGH (ref 70–99)
Potassium: 3.5 mmol/L (ref 3.5–5.1)
Sodium: 132 mmol/L — ABNORMAL LOW (ref 135–145)
Total Bilirubin: 0.7 mg/dL (ref 0.3–1.2)
Total Protein: 6.1 g/dL — ABNORMAL LOW (ref 6.5–8.1)

## 2019-09-03 LAB — CBC WITH DIFFERENTIAL/PLATELET
Abs Immature Granulocytes: 0.02 10*3/uL (ref 0.00–0.07)
Basophils Absolute: 0 10*3/uL (ref 0.0–0.1)
Basophils Relative: 1 %
Eosinophils Absolute: 0.2 10*3/uL (ref 0.0–0.5)
Eosinophils Relative: 5 %
HCT: 32.7 % — ABNORMAL LOW (ref 39.0–52.0)
Hemoglobin: 10 g/dL — ABNORMAL LOW (ref 13.0–17.0)
Immature Granulocytes: 1 %
Lymphocytes Relative: 34 %
Lymphs Abs: 1.5 10*3/uL (ref 0.7–4.0)
MCH: 28 pg (ref 26.0–34.0)
MCHC: 30.6 g/dL (ref 30.0–36.0)
MCV: 91.6 fL (ref 80.0–100.0)
Monocytes Absolute: 0.4 10*3/uL (ref 0.1–1.0)
Monocytes Relative: 10 %
Neutro Abs: 2.2 10*3/uL (ref 1.7–7.7)
Neutrophils Relative %: 49 %
Platelets: 186 10*3/uL (ref 150–400)
RBC: 3.57 MIL/uL — ABNORMAL LOW (ref 4.22–5.81)
RDW: 18.4 % — ABNORMAL HIGH (ref 11.5–15.5)
WBC: 4.4 10*3/uL (ref 4.0–10.5)
nRBC: 0 % (ref 0.0–0.2)

## 2019-09-03 MED ORDER — SODIUM CHLORIDE 0.9 % IV SOLN
Freq: Once | INTRAVENOUS | Status: AC
  Filled 2019-09-03: qty 250

## 2019-09-03 MED ORDER — ZOLEDRONIC ACID 4 MG/100ML IV SOLN
4.0000 mg | Freq: Once | INTRAVENOUS | Status: AC
  Administered 2019-09-03: 4 mg via INTRAVENOUS
  Filled 2019-09-03: qty 100

## 2019-09-03 NOTE — Progress Notes (Signed)
1440: Calcium 8.1, Per Sarah RN per Dr. Grayland Ormond proceed with scheduled Zometa treatment.

## 2019-09-03 NOTE — Progress Notes (Signed)
Pt here for follow up. No complaints or concerns.  

## 2019-09-04 ENCOUNTER — Telehealth: Payer: Self-pay

## 2019-09-04 NOTE — Telephone Encounter (Signed)
Telephone call to patient to schedule palliative care visit with patient. Patient/family in agreement with home visit on 09-07-19 at 2:00PM.

## 2019-09-05 LAB — KAPPA/LAMBDA LIGHT CHAINS
Kappa free light chain: 568.1 mg/L — ABNORMAL HIGH (ref 3.3–19.4)
Kappa, lambda light chain ratio: 31.74 — ABNORMAL HIGH (ref 0.26–1.65)
Lambda free light chains: 17.9 mg/L (ref 5.7–26.3)

## 2019-09-05 LAB — IGG, IGA, IGM
IgA: 124 mg/dL (ref 61–437)
IgG (Immunoglobin G), Serum: 719 mg/dL (ref 603–1613)
IgM (Immunoglobulin M), Srm: 44 mg/dL (ref 15–143)

## 2019-09-07 ENCOUNTER — Other Ambulatory Visit: Payer: Self-pay

## 2019-09-07 ENCOUNTER — Other Ambulatory Visit: Payer: Medicare Other

## 2019-09-07 DIAGNOSIS — Z515 Encounter for palliative care: Secondary | ICD-10-CM

## 2019-09-07 NOTE — Progress Notes (Signed)
COMMUNITY PALLIATIVE CARE SW NOTE  PATIENT NAME: Adam Moss DOB: January 17, 1934 MRN: [id number]  PRIMARY CARE PROVIDER: Perrin Maltese, MD  RESPONSIBLE PARTY:  Acct ID - Guarantor Home Phone Work Phone Relationship Acct Type  [id number] [name]  Self P/F     [address], [place], [place] [address]     PLAN OF CARE and INTERVENTIONS:             1. GOALS OF CARE/ ADVANCE CARE PLANNING:  Patient is DNR. Facility keeps the copy. HCPOA is Adam Moss (patient's son). Living Will is complete. Goal is to remain as independent as he can. Discussed setting small goals, and creating a list of things that patient can check off and reflect on progress. 2. SOCIAL/EMOTIONAL/SPIRITUAL ASSESSMENT/ INTERVENTIONS:  SW met with patient in his apartment. Patient said he is doing "fairly well". Patient said when he is sitting still he has no pain, but rates pain at 4 out of 10 with movement. Pain is typically throughout his back. Patient is using heating pad, as needed. Patient continues to have swelling in his legs. Patient has ongoing cough. Patient said his appetite is fair. Patient also feels his disrupted sleep pattern has impacted his meals. Patient has trouble falling sleep and usually does not go to bed until 2AM-4AM. Discussed relaxation techniques. Reflected on patient's life and the things that he enjoys doing. SW discussed goals of care, used active and reflective listening, and provided emotional support.  3. PATIENT/CAREGIVER EDUCATION/ COPING:  Patient was alert, engaged. Patient openly expresses feelings with SW. Processed patient's feelings around his loss of independence. Discussed focusing on things that he can control. Patient enjoys crosswords and sudoku puzzles. Family is supportive.  4. PERSONAL EMERGENCY PLAN:  Facility will follow protocol. 5. COMMUNITY RESOURCES COORDINATION/ HEALTH CARE NAVIGATION:  Patient manages his care with support of family. Patient had a  follow-up for labs and infusion at cancer center on 2/8 and said he received a good report. Patient has a new urology appointment on 2/24. Patient is scheduled for his next labs and infusion on 3/8. 6. FINANCIAL/LEGAL CONCERNS/INTERVENTIONS:  None.     SOCIAL HX:  Social History   Tobacco Use  . Smoking status: Never Smoker  . Smokeless tobacco: Never Used  Substance Use Topics  . Alcohol use: No    Alcohol/week: 0.0 standard drinks    CODE STATUS:   Code Status: Prior (DNR) ADVANCED DIRECTIVES: Y MOST FORM COMPLETE:  No. HOSPICE EDUCATION PROVIDED: None.  PPS: Patient is mostly independent of ADLs. Patient continues ambulating with his walker, is having increased difficulty with weakness.  I spent27mnutes with patient/family, from2:00-2:45pproviding education, support and consultation.  WMargaretmary Lombard LCSW

## 2019-09-10 ENCOUNTER — Other Ambulatory Visit: Payer: Self-pay

## 2019-09-10 ENCOUNTER — Encounter: Payer: Self-pay | Admitting: Emergency Medicine

## 2019-09-10 ENCOUNTER — Emergency Department: Payer: Medicare Other

## 2019-09-10 ENCOUNTER — Inpatient Hospital Stay
Admission: EM | Admit: 2019-09-10 | Discharge: 2019-09-13 | DRG: 641 | Disposition: A | Discharge status: Skilled Nursing Facility | Payer: Medicare Other | Attending: Hospitalist | Admitting: Hospitalist

## 2019-09-10 DIAGNOSIS — R531 Weakness: Secondary | ICD-10-CM | POA: Diagnosis not present

## 2019-09-10 DIAGNOSIS — E871 Hypo-osmolality and hyponatremia: Secondary | ICD-10-CM | POA: Diagnosis not present

## 2019-09-10 DIAGNOSIS — N401 Enlarged prostate with lower urinary tract symptoms: Secondary | ICD-10-CM | POA: Diagnosis present

## 2019-09-10 DIAGNOSIS — K5900 Constipation, unspecified: Secondary | ICD-10-CM | POA: Diagnosis present

## 2019-09-10 DIAGNOSIS — Z791 Long term (current) use of non-steroidal anti-inflammatories (NSAID): Secondary | ICD-10-CM

## 2019-09-10 DIAGNOSIS — R52 Pain, unspecified: Secondary | ICD-10-CM | POA: Diagnosis not present

## 2019-09-10 DIAGNOSIS — E785 Hyperlipidemia, unspecified: Secondary | ICD-10-CM | POA: Diagnosis present

## 2019-09-10 DIAGNOSIS — Z7982 Long term (current) use of aspirin: Secondary | ICD-10-CM

## 2019-09-10 DIAGNOSIS — C9000 Multiple myeloma not having achieved remission: Secondary | ICD-10-CM | POA: Diagnosis not present

## 2019-09-10 DIAGNOSIS — Z7951 Long term (current) use of inhaled steroids: Secondary | ICD-10-CM

## 2019-09-10 DIAGNOSIS — E860 Dehydration: Principal | ICD-10-CM | POA: Diagnosis present

## 2019-09-10 DIAGNOSIS — I878 Other specified disorders of veins: Secondary | ICD-10-CM | POA: Diagnosis present

## 2019-09-10 DIAGNOSIS — Z20822 Contact with and (suspected) exposure to covid-19: Secondary | ICD-10-CM | POA: Diagnosis present

## 2019-09-10 DIAGNOSIS — R112 Nausea with vomiting, unspecified: Secondary | ICD-10-CM | POA: Diagnosis not present

## 2019-09-10 DIAGNOSIS — Z809 Family history of malignant neoplasm, unspecified: Secondary | ICD-10-CM

## 2019-09-10 DIAGNOSIS — I10 Essential (primary) hypertension: Secondary | ICD-10-CM | POA: Diagnosis present

## 2019-09-10 DIAGNOSIS — J449 Chronic obstructive pulmonary disease, unspecified: Secondary | ICD-10-CM | POA: Diagnosis present

## 2019-09-10 DIAGNOSIS — Z8249 Family history of ischemic heart disease and other diseases of the circulatory system: Secondary | ICD-10-CM

## 2019-09-10 DIAGNOSIS — Z79899 Other long term (current) drug therapy: Secondary | ICD-10-CM

## 2019-09-10 DIAGNOSIS — R600 Localized edema: Secondary | ICD-10-CM | POA: Diagnosis present

## 2019-09-10 DIAGNOSIS — N390 Urinary tract infection, site not specified: Secondary | ICD-10-CM | POA: Diagnosis present

## 2019-09-10 DIAGNOSIS — T451X5A Adverse effect of antineoplastic and immunosuppressive drugs, initial encounter: Secondary | ICD-10-CM

## 2019-09-10 DIAGNOSIS — E878 Other disorders of electrolyte and fluid balance, not elsewhere classified: Secondary | ICD-10-CM | POA: Diagnosis not present

## 2019-09-10 DIAGNOSIS — J9811 Atelectasis: Secondary | ICD-10-CM | POA: Diagnosis not present

## 2019-09-10 DIAGNOSIS — E861 Hypovolemia: Secondary | ICD-10-CM | POA: Diagnosis present

## 2019-09-10 DIAGNOSIS — R5381 Other malaise: Secondary | ICD-10-CM | POA: Diagnosis present

## 2019-09-10 DIAGNOSIS — R319 Hematuria, unspecified: Secondary | ICD-10-CM | POA: Diagnosis present

## 2019-09-10 DIAGNOSIS — Z886 Allergy status to analgesic agent status: Secondary | ICD-10-CM

## 2019-09-10 DIAGNOSIS — D84821 Immunodeficiency due to drugs: Secondary | ICD-10-CM

## 2019-09-10 DIAGNOSIS — I959 Hypotension, unspecified: Secondary | ICD-10-CM | POA: Diagnosis not present

## 2019-09-10 DIAGNOSIS — Z66 Do not resuscitate: Secondary | ICD-10-CM | POA: Diagnosis present

## 2019-09-10 DIAGNOSIS — G20 Parkinson's disease: Secondary | ICD-10-CM | POA: Diagnosis present

## 2019-09-10 DIAGNOSIS — R609 Edema, unspecified: Secondary | ICD-10-CM | POA: Diagnosis not present

## 2019-09-10 LAB — CBC WITH DIFFERENTIAL/PLATELET
Abs Immature Granulocytes: 0.03 10*3/uL (ref 0.00–0.07)
Basophils Absolute: 0 10*3/uL (ref 0.0–0.1)
Basophils Relative: 0 %
Eosinophils Absolute: 0 10*3/uL (ref 0.0–0.5)
Eosinophils Relative: 1 %
HCT: 32.9 % — ABNORMAL LOW (ref 39.0–52.0)
Hemoglobin: 10.3 g/dL — ABNORMAL LOW (ref 13.0–17.0)
Immature Granulocytes: 1 %
Lymphocytes Relative: 14 %
Lymphs Abs: 0.7 10*3/uL (ref 0.7–4.0)
MCH: 27.6 pg (ref 26.0–34.0)
MCHC: 31.3 g/dL (ref 30.0–36.0)
MCV: 88.2 fL (ref 80.0–100.0)
Monocytes Absolute: 0.7 10*3/uL (ref 0.1–1.0)
Monocytes Relative: 13 %
Neutro Abs: 3.6 10*3/uL (ref 1.7–7.7)
Neutrophils Relative %: 71 %
Platelets: 207 10*3/uL (ref 150–400)
RBC: 3.73 MIL/uL — ABNORMAL LOW (ref 4.22–5.81)
RDW: 17.9 % — ABNORMAL HIGH (ref 11.5–15.5)
WBC: 5.1 10*3/uL (ref 4.0–10.5)
nRBC: 0 % (ref 0.0–0.2)

## 2019-09-10 LAB — URINALYSIS, COMPLETE (UACMP) WITH MICROSCOPIC
Bacteria, UA: NONE SEEN
Bilirubin Urine: NEGATIVE
Glucose, UA: NEGATIVE mg/dL
Ketones, ur: 5 mg/dL — AB
Leukocytes,Ua: NEGATIVE
Nitrite: NEGATIVE
Protein, ur: 100 mg/dL — AB
RBC / HPF: >50 RBC/hpf — ABNORMAL HIGH (ref 0–5)
Specific Gravity, Urine: 1.021 (ref 1.005–1.030)
Squamous Epithelial / HPF: NONE SEEN (ref 0–5)
pH: 5 (ref 5.0–8.0)

## 2019-09-10 LAB — COMPREHENSIVE METABOLIC PANEL
ALT: 8 U/L (ref 0–44)
AST: 25 U/L (ref 15–41)
Albumin: 3.2 g/dL — ABNORMAL LOW (ref 3.5–5.0)
Alkaline Phosphatase: 84 U/L (ref 38–126)
Anion gap: 10 (ref 5–15)
BUN: 17 mg/dL (ref 8–23)
CO2: 22 mmol/L (ref 22–32)
Calcium: 7.5 mg/dL — ABNORMAL LOW (ref 8.9–10.3)
Chloride: 95 mmol/L — ABNORMAL LOW (ref 98–111)
Creatinine, Ser: 1 mg/dL (ref 0.61–1.24)
GFR calc Af Amer: >60 mL/min (ref >60)
GFR calc non Af Amer: >60 mL/min (ref >60)
Glucose, Bld: 124 mg/dL — ABNORMAL HIGH (ref 70–99)
Potassium: 3.6 mmol/L (ref 3.5–5.1)
Sodium: 127 mmol/L — ABNORMAL LOW (ref 135–145)
Total Bilirubin: 1.2 mg/dL (ref 0.3–1.2)
Total Protein: 6.7 g/dL (ref 6.5–8.1)

## 2019-09-10 LAB — LIPASE, BLOOD: Lipase: 19 U/L (ref 11–51)

## 2019-09-10 MED ORDER — SODIUM CHLORIDE 0.9 % IV SOLN
2.0000 g | Freq: Once | INTRAVENOUS | Status: AC
  Administered 2019-09-11: 2 g via INTRAVENOUS
  Filled 2019-09-10: qty 20

## 2019-09-10 MED ORDER — SODIUM CHLORIDE 0.9 % IV BOLUS
1000.0000 mL | Freq: Once | INTRAVENOUS | Status: AC
  Administered 2019-09-10: 1000 mL via INTRAVENOUS

## 2019-09-10 MED ORDER — SODIUM CHLORIDE 0.9 % IV BOLUS: 500.0000 mL | Freq: Once | INTRAVENOUS | Status: DC

## 2019-09-10 MED ORDER — SODIUM CHLORIDE 0.9 % IV SOLN
Freq: Once | INTRAVENOUS | Status: AC
  Administered 2019-09-10: 1000 mL via INTRAVENOUS

## 2019-09-10 MED ORDER — ONDANSETRON HCL 4 MG/2ML IJ SOLN
INTRAMUSCULAR | Status: AC
  Filled 2019-09-10: qty 2

## 2019-09-10 MED ORDER — ONDANSETRON HCL 4 MG/2ML IJ SOLN
4.0000 mg | Freq: Once | INTRAMUSCULAR | Status: AC
  Administered 2019-09-11: 4 mg via INTRAVENOUS

## 2019-09-10 MED ORDER — SODIUM CHLORIDE 0.9% FLUSH
3.0000 mL | Freq: Once | INTRAVENOUS | Status: AC
  Administered 2019-09-10: 3 mL via INTRAVENOUS

## 2019-09-10 NOTE — ED Notes (Signed)
Pt given a tray of food as he has not eaten all day. Pt states the medicine he is on for his cancer causes things to taste differently and he loses interest in eating. Pt also given his parkinson's med from his bag as directed by ED MD.

## 2019-09-10 NOTE — ED Notes (Signed)
Pt given a urinal and is attempting to give a sample.

## 2019-09-10 NOTE — ED Provider Notes (Signed)
-----------------------------------------   11:08 PM on 09/10/2019 -----------------------------------------  Assuming care from Dr. Ellender Hose.  In short, Adam Moss is a 86 y.o. male with a chief complaint of dehydration.  Refer to the original H&P for additional details.  The current plan of care is to reassess after urinalysis.  Anticipate discharge home.    ----------------------------------------- 12:13 AM on 09/11/2019 -----------------------------------------  Unfortunately the patient is feeling worse again in terms of the nausea and vomiting and says he cannot eat or drink anything or he is going to throw up.  He wanted to go home earlier but no longer thinks he can do so since he cannot drink anything.  Additionally his urinalysis came back notable for hematuria with red cells but also with white cells.  Urine culture has been ordered.  I spoke by phone with Dr. Janese Banks with hematology/oncology since the patient is actively undergoing chemotherapy treatment and is a patient of Dr. Grayland Ormond.  She agreed that bring him into the hospital is very appropriate under the circumstances for fluids, antiemetics, and empiric antibiotics, at least until the urine culture comes back given the patient's increased risk.  She advised that consulting Dr. Grayland Ormond for him to follow-up during the day today would be appropriate.  I have consulted the hospitalist team for admission and I am awaiting a callback.    ----------------------------------------- 12:24 AM on 09/11/2019 -----------------------------------------  Dr. Ellender Hose discussed the case by phone with Dr. Sidney Ace who will admit.   Hinda Kehr, MD 09/11/19 Laureen Abrahams

## 2019-09-10 NOTE — ED Triage Notes (Signed)
Pt here via EMS from [place] with multiple c/o constipation, dehydration, swelling of legs, increased weakness.. pt has a hx of lymphedema.

## 2019-09-10 NOTE — ED Provider Notes (Signed)
[facility] Emergency Department Provider Note   ____________________________________________   First MD Initiated Contact with Patient 09/10/19 2015     (approximate)  I have reviewed the triage vital signs and the nursing notes.   HISTORY  Chief Complaint Constipation and Leg Swelling    HPI Adam Moss is a 86 y.o. male here for evaluation of fatigue and feeling dehydrated  Patient reports that he is undergoing treatment for multiple myeloma, he is frequently has no real appetite or does not like the taste of things due to his treatments.  He feels like he is getting dehydrated, reports this is happened before.   Occasionally during his infusions he will receive additional normal saline but did not have this last time  Seen by the nurse [facility] at [facility].  Been feeling a bit constipated the last few days, but had a bowel movement today and feels relief of that.  He also has been having swelling in both his legs for several months diagnosed with lymphedema and having occasional episodes of dehydration  No pain or discomfort.  No fevers or chills.  No chest pain no known Covid exposure no shortness of breath.    Past Medical History:  Diagnosis Date  . Cancer ([place])    skin  . COPD (chronic obstructive pulmonary disease) ([place])   . Hyperlipemia   . Hypertension   . Lymphedema of both lower extremities   . Parkinson disease ([place])   . Parkinsonism ([place]) 02/21/2015  . Spinal stenosis   . Tremor     Patient Active Problem List   Diagnosis Date Noted  . Pain due to onychomycosis of toenails of both feet 08/09/2019  . Pincer nail deformity 08/09/2019  . Hypertrophy of prostate with urinary obstruction and other lower urinary tract symptoms (LUTS) 08/02/2019  . Healthcare maintenance 07/06/2019  . Mixed hyperlipidemia 04/24/2019  . Chronic obstructive pulmonary disease ([place]) 04/24/2019  . Multiple myeloma ([place]) 04/24/2019  . Other  lymphedema 04/24/2019  . Edema 04/24/2019  . Essential hypertension 04/24/2019  . Lower extremity edema 04/02/2019  . Pathologic fracture 03/30/2019  . Cough 12/04/2015  . Non-seasonal allergic rhinitis 12/04/2015  . Parkinson disease ([place]) 02/21/2015  . Spinal stenosis in cervical region 11/30/2013  . Tremor 11/30/2013  . Patient risk and functional assessment 04/14/2012  . History of actinic keratoses 10/02/2010  . History of nonmelanoma skin cancer 10/02/2010  . Chest pain, unspecified 03/28/2007    Past Surgical History:  Procedure Laterality Date  . APPENDECTOMY    . CATARACT EXTRACTION    . CHOLECYSTECTOMY    . TONSILLECTOMY    . VASECTOMY      Prior to Admission medications   Medication Sig Start Date End Date Taking? Authorizing Provider  aspirin 81 MG tablet Take 81 mg by mouth daily.    [provider]  Calcium Carbonate-Vitamin D (OYSTER SHELL CALCIUM/D) 250-125 MG-UNIT TABS Take by mouth.    [provider]  Calcium Citrate 250 MG TABS Take 1 tablet by mouth daily.     [provider]  Carbidopa-Levodopa ER (RYTARY) 48.75-195 MG CPCR Take 195 mg by mouth 5 (five) times daily. 08/21/19   Star Age, MD  cetirizine (ZYRTEC) 10 MG tablet Take 10 mg by mouth daily.    [provider]  cyanocobalamin (,VITAMIN B-12,) 1000 MCG/ML injection  08/02/19   [provider]  finasteride (PROSCAR) 5 MG tablet Take 5 mg by mouth daily.    [provider]  fluticasone (FLONASE) 50 MCG/ACT nasal spray Place into both nostrils daily.    [provider]  fluticasone furoate-vilanterol (BREO ELLIPTA) 100-25 MCG/INH AEPB USE 1 INHALATION ORALLY    DAILY 07/06/19   Lauraine Rinne, NP  Fluticasone-Umeclidin-Vilant (TRELEGY ELLIPTA) 100-62.5-25 MCG/INH AEPB Trelegy Ellipta 100-62.5-25 MCG/INH Inhalation Aerosol Powder Breath Activated QTY: 3 inhaler Days: 90 Refills: 3  Written: 08/02/19 Patient Instructions: inhale 1 puff by mouth  once daily. Rinse mouth well after use. 08/02/19   [provider]  furosemide (LASIX) 20 MG tablet Take 1 tablet by mouth daily. 05/06/19   [provider]  ipratropium (ATROVENT) 0.03 % nasal spray Place 0.03 mLs into both nostrils 3 (three) times daily as needed. 05/25/17   [provider]  lenalidomide (REVLIMID) 10 MG capsule Take 1 capsule (10 mg total) by mouth daily. 08/14/19   Lloyd Huger, MD  meloxicam (MOBIC) 15 MG tablet Take 15 mg by mouth daily. 06/18/19   [provider]  methocarbamol (ROBAXIN) 500 MG tablet methocarbamol 500 mg tablet  TAKE 1 TABLET BY MOUTH EVERY DAY    [provider]  niacin 500 MG tablet Take 500 mg by mouth every morning.     [provider]  olmesartan (BENICAR) 20 MG tablet Take 20 mg by mouth daily.    [provider]  omeprazole (PRILOSEC) 40 MG capsule Take 40 mg by mouth daily.    [provider]  PROAIR HFA 108 (90 Base) MCG/ACT inhaler INHALE 2 PUFFS INTO THE LUNGS AS NEEDED. Patient not taking: Reported on 08/06/2019 03/30/18   Flora Lipps, MD  Simethicone (GAS RELIEF PO) Take 1 Dose by mouth as needed.     [provider]  simvastatin (ZOCOR) 10 MG tablet Take 10 mg by mouth daily.    [provider]  tamsulosin (FLOMAX) 0.4 MG CAPS capsule Take 0.4 mg by mouth daily.     [provider]  vitamin C (ASCORBIC ACID) 500 MG tablet Take 500 mg by mouth daily.    [provider]    Allergies Carbidopa-levodopa, Oxycodone-acetaminophen, Tyloxapol, Acetaminophen, and Pramipexole  Family History  Problem Relation Age of Onset  . Cancer Mother   . Heart disease Father     Social History Social History   Tobacco Use  . Smoking status: Never Smoker  . Smokeless tobacco: Never Used  Substance Use Topics  . Alcohol use: No    Alcohol/week: 0.0 standard drinks  . Drug use: No    Review of Systems Constitutional: No  fever/chills Eyes: No visual changes. ENT:  Thirsty. Cardiovascular: Denies chest pain. Respiratory: Denies shortness of breath. Gastrointestinal: No abdominal pain.   Genitourinary: Negative for dysuria.  Urine seeming slightly dark last couple days. Musculoskeletal: Negative for back pain.  Chronic swelling in both legs diagnosed with lymphedema. Skin: Negative for rash. Neurological: Negative for headaches, areas of focal weakness or numbness.    ____________________________________________   PHYSICAL EXAM:  VITAL SIGNS: ED Triage Vitals  Enc Vitals Group     BP 09/10/19 1330 (!) 90/54     Pulse Rate 09/10/19 1330 91     Resp 09/10/19 1330 18     Temp 09/10/19 1330 98.3 F (36.8 C)     Temp Source 09/10/19 1330 Oral     SpO2 09/10/19 1330 96 %     Weight 09/10/19 1332 192 lb 3.2 oz (87.2 kg)     Height 09/10/19 1332 _0  (1.803 m)  Head Circumference --      Peak Flow --      Pain Score 09/10/19 1332 5     Pain Loc --      Pain Edu? --      Excl. in [place]? --     Constitutional: Alert and oriented. Well appearing and in no acute distress.  Appears chronically ill but in no acute distress.  Very pleasant. Eyes: Conjunctivae are normal. Head: Atraumatic. Nose: No congestion/rhinnorhea. Mouth/Throat: Mucous membranes are moderately dry. Neck: No stridor.  Cardiovascular: Normal rate, regular rhythm. Grossly normal heart sounds.  Good peripheral circulation. Respiratory: Normal respiratory effort.  No retractions. Lungs CTAB. Gastrointestinal: Soft and nontender. No distention. Musculoskeletal: No lower extremity tenderness is moderate bilateral lymphedema.  Neurologic:  Normal speech and language. No gross focal neurologic deficits are appreciated.  Modest left upper extremity tremor, patient reports chronic due to Parkinson's. Skin:  Skin is warm, dry and intact. No rash noted. Psychiatric: Mood and affect are normal. Speech and behavior are  normal.  ____________________________________________   LABS (all labs ordered are listed, but only abnormal results are displayed)  Labs Reviewed  CBC WITH DIFFERENTIAL/PLATELET - Abnormal; Notable for the following components:      Result Value   RBC 3.73 (*)    Hemoglobin 10.3 (*)    HCT 32.9 (*)    RDW 17.9 (*)    All other components within normal limits  COMPREHENSIVE METABOLIC PANEL - Abnormal; Notable for the following components:   Sodium 127 (*)    Chloride 95 (*)    Glucose, Bld 124 (*)    Calcium 7.5 (*)    Albumin 3.2 (*)    All other components within normal limits  LIPASE, BLOOD  URINALYSIS, COMPLETE (UACMP) WITH MICROSCOPIC  CBG MONITORING, ED   ____________________________________________  EKG  Reviewed entered by me at 1340 Heart rate 99 QRS 85 QTc 460 Normal sinus rhythm, no evidence of acute ischemia denoted.  Some baseline artifact ____________________________________________  RADIOLOGY  No results found.  No noted cause for imaging noted.  Patient not complaining of any acute neurologic or cardiopulmonary symptoms.  No associated abdominal symptoms.  Felt constipated earlier completely relieved with a bowel movement abdomen soft nontender. ____________________________________________   PROCEDURES  Procedure(s) performed: None  Procedures  Critical Care performed: No  ____________________________________________   INITIAL IMPRESSION / ASSESSMENT AND PLAN / ED COURSE  Pertinent labs & imaging results that were available during my care of the patient were reviewed by me and considered in my medical decision making (see chart for details).   Patient was for evaluation of feeling somewhat fatigued, feeling dehydrated.  On examination he does appear slightly hypovolemic with regard to mucous membranes and mild hyponatremia.  He does have lymphedema however which is chronic but also occasionally gets dehydrated.  He has a documented history of  baseline hypotension.  He denies any acute neurologic vascular cardiac or pulmonary symptoms.  No infectious symptoms or exposure to Covid  Discussed with the patient, given his symptoms, his clinical history I think it would be reasonable to give him a fluid bolus and reassess.  He is comfortable with that plan, he is able to follow-up closely with Dr. Grayland Ormond, reports IV fluids in the past of help relieve similar symptoms as well usually receiving these during some of his infusions from time to time.  Tonight he plans to stay in the care of the skilled nursing facility as well if returning to [facility]  [place].  Ongoing care signed Dr. Ellender Hose, follow-up on reassessment and completion of fluid bolus      ____________________________________________   FINAL CLINICAL IMPRESSION(S) / ED DIAGNOSES  Final diagnoses:  Dehydration, mild  Hyponatremia        Note:  This document was prepared using Dragon voice recognition software and may include unintentional dictation errors       Delman Kitten, MD 09/10/19 2037

## 2019-09-10 NOTE — ED Notes (Signed)
This RN spoke with Dr. Joan Mayans regarding patient care, see orders.

## 2019-09-10 NOTE — ED Triage Notes (Addendum)
Pt presents to ED via [facility] from [facility] with c/o lymphedema, constipation, and possible dehydration. Pt states was sent by facility RN for evaluation. Per paperwork from [facility] they would like for patient to return to SNF not apartments where he currently lives.   Pt states constipation x several days, pt states swelling in his ankles x several years, and dehydration x several months.   Pt states is currently a patient at cancer center for multiple myeloma, seen 1 week ago today.

## 2019-09-10 NOTE — ED Provider Notes (Signed)
Assumed care from Dr. Jacqualine Code.  Briefly, 86 year old male who is currently undergoing treatment for cancer here with likely dehydration and generalized weakness.  No fever or apparent infectious symptoms.  White count and CMP are largely unremarkable.  He feels markedly improved with fluids, but is awaiting urinalysis and repeat assessment after finishing his fluid.  Likely discharge if he continues to feel well.  Update: UA c/f possible UTI. Pt is nontoxic on exam but is on chemo, and has ongoing nausea, weakness. Will admit for ongoing hydration, ABX.   Duffy Bruce, MD 09/11/19 [phone]

## 2019-09-11 ENCOUNTER — Other Ambulatory Visit: Payer: Self-pay

## 2019-09-11 ENCOUNTER — Encounter: Payer: Self-pay | Admitting: Family Medicine

## 2019-09-11 DIAGNOSIS — Z7982 Long term (current) use of aspirin: Secondary | ICD-10-CM

## 2019-09-11 DIAGNOSIS — R5381 Other malaise: Secondary | ICD-10-CM | POA: Diagnosis present

## 2019-09-11 DIAGNOSIS — M2550 Pain in unspecified joint: Secondary | ICD-10-CM | POA: Diagnosis not present

## 2019-09-11 DIAGNOSIS — C9000 Multiple myeloma not having achieved remission: Secondary | ICD-10-CM

## 2019-09-11 DIAGNOSIS — N179 Acute kidney failure, unspecified: Secondary | ICD-10-CM | POA: Diagnosis not present

## 2019-09-11 DIAGNOSIS — Z66 Do not resuscitate: Secondary | ICD-10-CM | POA: Diagnosis present

## 2019-09-11 DIAGNOSIS — Z79899 Other long term (current) drug therapy: Secondary | ICD-10-CM | POA: Diagnosis not present

## 2019-09-11 DIAGNOSIS — N390 Urinary tract infection, site not specified: Secondary | ICD-10-CM

## 2019-09-11 DIAGNOSIS — R5383 Other fatigue: Secondary | ICD-10-CM | POA: Diagnosis not present

## 2019-09-11 DIAGNOSIS — K5900 Constipation, unspecified: Secondary | ICD-10-CM | POA: Diagnosis present

## 2019-09-11 DIAGNOSIS — R531 Weakness: Secondary | ICD-10-CM

## 2019-09-11 DIAGNOSIS — G20 Parkinson's disease: Secondary | ICD-10-CM | POA: Diagnosis present

## 2019-09-11 DIAGNOSIS — E871 Hypo-osmolality and hyponatremia: Secondary | ICD-10-CM

## 2019-09-11 DIAGNOSIS — K219 Gastro-esophageal reflux disease without esophagitis: Secondary | ICD-10-CM

## 2019-09-11 DIAGNOSIS — Z8249 Family history of ischemic heart disease and other diseases of the circulatory system: Secondary | ICD-10-CM | POA: Diagnosis not present

## 2019-09-11 DIAGNOSIS — Z791 Long term (current) use of non-steroidal anti-inflammatories (NSAID): Secondary | ICD-10-CM | POA: Diagnosis not present

## 2019-09-11 DIAGNOSIS — M549 Dorsalgia, unspecified: Secondary | ICD-10-CM | POA: Diagnosis not present

## 2019-09-11 DIAGNOSIS — Z7401 Bed confinement status: Secondary | ICD-10-CM | POA: Diagnosis not present

## 2019-09-11 DIAGNOSIS — Z886 Allergy status to analgesic agent status: Secondary | ICD-10-CM | POA: Diagnosis not present

## 2019-09-11 DIAGNOSIS — E861 Hypovolemia: Secondary | ICD-10-CM | POA: Diagnosis present

## 2019-09-11 DIAGNOSIS — J449 Chronic obstructive pulmonary disease, unspecified: Secondary | ICD-10-CM | POA: Diagnosis present

## 2019-09-11 DIAGNOSIS — R600 Localized edema: Secondary | ICD-10-CM | POA: Diagnosis present

## 2019-09-11 DIAGNOSIS — E785 Hyperlipidemia, unspecified: Secondary | ICD-10-CM | POA: Diagnosis present

## 2019-09-11 DIAGNOSIS — I10 Essential (primary) hypertension: Secondary | ICD-10-CM

## 2019-09-11 DIAGNOSIS — E860 Dehydration: Secondary | ICD-10-CM | POA: Diagnosis present

## 2019-09-11 DIAGNOSIS — I878 Other specified disorders of veins: Secondary | ICD-10-CM | POA: Diagnosis present

## 2019-09-11 DIAGNOSIS — Z809 Family history of malignant neoplasm, unspecified: Secondary | ICD-10-CM | POA: Diagnosis not present

## 2019-09-11 DIAGNOSIS — Z7951 Long term (current) use of inhaled steroids: Secondary | ICD-10-CM | POA: Diagnosis not present

## 2019-09-11 DIAGNOSIS — R319 Hematuria, unspecified: Secondary | ICD-10-CM | POA: Diagnosis present

## 2019-09-11 DIAGNOSIS — D649 Anemia, unspecified: Secondary | ICD-10-CM

## 2019-09-11 DIAGNOSIS — Z20822 Contact with and (suspected) exposure to covid-19: Secondary | ICD-10-CM | POA: Diagnosis present

## 2019-09-11 DIAGNOSIS — N401 Enlarged prostate with lower urinary tract symptoms: Secondary | ICD-10-CM | POA: Diagnosis present

## 2019-09-11 DIAGNOSIS — E878 Other disorders of electrolyte and fluid balance, not elsewhere classified: Secondary | ICD-10-CM | POA: Diagnosis present

## 2019-09-11 LAB — CBC
HCT: 28.1 % — ABNORMAL LOW (ref 39.0–52.0)
Hemoglobin: 8.8 g/dL — ABNORMAL LOW (ref 13.0–17.0)
MCH: 28.1 pg (ref 26.0–34.0)
MCHC: 31.3 g/dL (ref 30.0–36.0)
MCV: 89.8 fL (ref 80.0–100.0)
Platelets: 143 10*3/uL — ABNORMAL LOW (ref 150–400)
RBC: 3.13 MIL/uL — ABNORMAL LOW (ref 4.22–5.81)
RDW: 18.3 % — ABNORMAL HIGH (ref 11.5–15.5)
WBC: 4.4 10*3/uL (ref 4.0–10.5)
nRBC: 0 % (ref 0.0–0.2)

## 2019-09-11 LAB — BASIC METABOLIC PANEL
Anion gap: 8 (ref 5–15)
BUN: 26 mg/dL — ABNORMAL HIGH (ref 8–23)
CO2: 23 mmol/L (ref 22–32)
Calcium: 7.1 mg/dL — ABNORMAL LOW (ref 8.9–10.3)
Chloride: 100 mmol/L (ref 98–111)
Creatinine, Ser: 1.22 mg/dL (ref 0.61–1.24)
GFR calc Af Amer: >60 mL/min (ref >60)
GFR calc non Af Amer: 54 mL/min — ABNORMAL LOW (ref >60)
Glucose, Bld: 92 mg/dL (ref 70–99)
Potassium: 3.3 mmol/L — ABNORMAL LOW (ref 3.5–5.1)
Sodium: 131 mmol/L — ABNORMAL LOW (ref 135–145)

## 2019-09-11 LAB — SARS CORONAVIRUS 2 (TAT 6-24 HRS): SARS Coronavirus 2: NEGATIVE

## 2019-09-11 MED ORDER — POTASSIUM CHLORIDE 20 MEQ PO PACK
40.0000 meq | PACK | Freq: Once | ORAL | Status: AC
  Administered 2019-09-11: 40 meq via ORAL
  Filled 2019-09-11: qty 2

## 2019-09-11 MED ORDER — ASPIRIN 81 MG PO CHEW
81.0000 mg | CHEWABLE_TABLET | Freq: Every day | ORAL | Status: DC
  Administered 2019-09-11 – 2019-09-13 (×3): 81 mg via ORAL
  Filled 2019-09-11 (×3): qty 1

## 2019-09-11 MED ORDER — SODIUM CHLORIDE 0.9 % IV SOLN: 1.0000 g | INTRAVENOUS | Status: DC

## 2019-09-11 MED ORDER — SIMETHICONE 80 MG PO CHEW
80.0000 mg | CHEWABLE_TABLET | Freq: Four times a day (QID) | ORAL | Status: DC | PRN
  Filled 2019-09-11: qty 1

## 2019-09-11 MED ORDER — LORATADINE 10 MG PO TABS
10.0000 mg | ORAL_TABLET | Freq: Every day | ORAL | Status: DC
  Administered 2019-09-11 – 2019-09-13 (×3): 10 mg via ORAL
  Filled 2019-09-11 (×3): qty 1

## 2019-09-11 MED ORDER — IRBESARTAN 75 MG PO TABS
37.5000 mg | ORAL_TABLET | Freq: Every day | ORAL | Status: DC
  Administered 2019-09-11 – 2019-09-13 (×3): 37.5 mg via ORAL
  Filled 2019-09-11 (×3): qty 0.5

## 2019-09-11 MED ORDER — CARBIDOPA-LEVODOPA ER 48.75-195 MG PO CPCR
1.0000 | ORAL_CAPSULE | Freq: Every day | ORAL | Status: DC
  Administered 2019-09-11 – 2019-09-13 (×9): 1 via ORAL
  Filled 2019-09-11 (×15): qty 1

## 2019-09-11 MED ORDER — UMECLIDINIUM BROMIDE 62.5 MCG/INH IN AEPB
1.0000 | INHALATION_SPRAY | Freq: Every morning | RESPIRATORY_TRACT | Status: DC
  Administered 2019-09-11 – 2019-09-13 (×2): 1 via RESPIRATORY_TRACT
  Filled 2019-09-11: qty 7

## 2019-09-11 MED ORDER — TRAZODONE HCL 50 MG PO TABS: 25.0000 mg | ORAL_TABLET | Freq: Every evening | ORAL | Status: DC | PRN

## 2019-09-11 MED ORDER — SIMVASTATIN 20 MG PO TABS
10.0000 mg | ORAL_TABLET | Freq: Every day | ORAL | Status: DC
  Administered 2019-09-11 – 2019-09-12 (×2): 10 mg via ORAL
  Filled 2019-09-11 (×2): qty 1

## 2019-09-11 MED ORDER — ONDANSETRON HCL 4 MG PO TABS: 4.0000 mg | ORAL_TABLET | Freq: Four times a day (QID) | ORAL | Status: DC | PRN

## 2019-09-11 MED ORDER — FLUTICASONE FUROATE-VILANTEROL 100-25 MCG/INH IN AEPB
1.0000 | INHALATION_SPRAY | Freq: Every morning | RESPIRATORY_TRACT | Status: DC
  Administered 2019-09-11 – 2019-09-13 (×3): 1 via RESPIRATORY_TRACT
  Filled 2019-09-11: qty 28

## 2019-09-11 MED ORDER — FINASTERIDE 5 MG PO TABS
5.0000 mg | ORAL_TABLET | Freq: Every day | ORAL | Status: DC
  Administered 2019-09-11 – 2019-09-13 (×3): 5 mg via ORAL
  Filled 2019-09-11 (×3): qty 1

## 2019-09-11 MED ORDER — ONDANSETRON HCL 4 MG/2ML IJ SOLN: 4.0000 mg | Freq: Four times a day (QID) | INTRAMUSCULAR | Status: DC | PRN

## 2019-09-11 MED ORDER — FLUTICASONE-UMECLIDIN-VILANT 100-62.5-25 MCG/INH IN AEPB: 1.0000 | INHALATION_SPRAY | Freq: Every day | RESPIRATORY_TRACT | Status: DC

## 2019-09-11 MED ORDER — SODIUM CHLORIDE 0.9 % IV SOLN: INTRAVENOUS | Status: DC

## 2019-09-11 MED ORDER — METHOCARBAMOL 500 MG PO TABS
500.0000 mg | ORAL_TABLET | Freq: Three times a day (TID) | ORAL | Status: DC | PRN
  Filled 2019-09-11: qty 1

## 2019-09-11 MED ORDER — FLUTICASONE PROPIONATE 50 MCG/ACT NA SUSP
2.0000 | Freq: Every day | NASAL | Status: DC
  Administered 2019-09-12 – 2019-09-13 (×2): 2 via NASAL
  Filled 2019-09-11: qty 16

## 2019-09-11 MED ORDER — TAMSULOSIN HCL 0.4 MG PO CAPS
0.4000 mg | ORAL_CAPSULE | Freq: Every day | ORAL | Status: DC
  Administered 2019-09-11 – 2019-09-13 (×3): 0.4 mg via ORAL
  Filled 2019-09-11 (×3): qty 1

## 2019-09-11 MED ORDER — PANTOPRAZOLE SODIUM 40 MG PO TBEC
40.0000 mg | DELAYED_RELEASE_TABLET | Freq: Every day | ORAL | Status: DC
  Administered 2019-09-11 – 2019-09-13 (×3): 40 mg via ORAL
  Filled 2019-09-11 (×3): qty 1

## 2019-09-11 MED ORDER — MAGNESIUM HYDROXIDE 400 MG/5ML PO SUSP: 30.0000 mL | Freq: Every day | ORAL | Status: DC | PRN

## 2019-09-11 MED ORDER — IPRATROPIUM BROMIDE 0.03 % NA SOLN
1.0000 | Freq: Two times a day (BID) | NASAL | Status: DC
  Administered 2019-09-11 – 2019-09-13 (×4): 1 via NASAL
  Filled 2019-09-11 (×2): qty 30

## 2019-09-11 MED ORDER — CALCIUM CITRATE 950 (200 CA) MG PO TABS
1.0000 | ORAL_TABLET | Freq: Every day | ORAL | Status: DC
  Administered 2019-09-11 – 2019-09-13 (×3): 200 mg via ORAL
  Filled 2019-09-11 (×3): qty 1

## 2019-09-11 MED ORDER — ASCORBIC ACID 500 MG PO TABS
500.0000 mg | ORAL_TABLET | Freq: Every day | ORAL | Status: DC
  Administered 2019-09-11 – 2019-09-13 (×2): 500 mg via ORAL
  Filled 2019-09-11 (×3): qty 1

## 2019-09-11 MED ORDER — NIACIN 500 MG PO TABS
500.0000 mg | ORAL_TABLET | ORAL | Status: DC
  Administered 2019-09-11 – 2019-09-13 (×3): 500 mg via ORAL
  Filled 2019-09-11 (×3): qty 1

## 2019-09-11 MED ORDER — ENOXAPARIN SODIUM 40 MG/0.4ML ~~LOC~~ SOLN
40.0000 mg | SUBCUTANEOUS | Status: DC
  Administered 2019-09-11 – 2019-09-13 (×3): 40 mg via SUBCUTANEOUS
  Filled 2019-09-11 (×3): qty 0.4

## 2019-09-11 NOTE — ED Notes (Signed)
Pt given meal tray from dietary. And pt and nurse talked with pt daughter about pt care and status.

## 2019-09-11 NOTE — ED Notes (Signed)
ED TO INPATIENT HANDOFF REPORT  ED Nurse Name and Phone #: [phone]  S Name/Age/Gender Adam Moss 86 y.o. male Room/Bed: ED34A/ED34A  Code Status   Code Status: DNR  Home/SNF/Other Nursing Home Patient oriented to: self, place, time and situation Is this baseline? Yes   Triage Complete: Triage complete  Chief Complaint UTI (urinary tract infection) [N39.0]  Triage Note Pt here via EMS from [facility] with multiple c/o constipation, dehydration, swelling of legs, increased weakness.. pt has a hx of lymphedema.   Pt presents to ED via [facility] from [place] with c/o lymphedema, constipation, and possible dehydration. Pt states was sent by facility RN for evaluation. Per paperwork from [place] they would like for patient to return to SNF not apartments where he currently lives.   Pt states constipation x several days, pt states swelling in his ankles x several years, and dehydration x several months.   Pt states is currently a patient at cancer center for multiple myeloma, seen 1 week ago today.     Allergies Allergies  Allergen Reactions  . Carbidopa-Levodopa Other (See Comments)    Severe stomach pains, can take rytary  . Oxycodone-Acetaminophen Other (See Comments)    "boils on skin"  . Tyloxapol   . Acetaminophen Rash, Nausea And Vomiting and Hives  . Pramipexole Nausea Only    Level of Care/Admitting Diagnosis ED Disposition    ED Disposition Condition [facility] Area: [place] [[id number]]  Level of Care: Med-Surg [16]  Covid Evaluation: Asymptomatic Screening Protocol (No Symptoms)  Diagnosis: UTI (urinary tract infection) [510258]  Admitting Physician: Christel Mormon [[id number]]  Attending Physician: Christel Mormon [[id number]]  Estimated length of stay: past midnight tomorrow  Certification:: I certify this patient will need inpatient services for at least 2 midnights       B Medical/Surgery History Past Medical  History:  Diagnosis Date  . Cancer ([place])    skin  . COPD (chronic obstructive pulmonary disease) ([place])   . Hyperlipemia   . Hypertension   . Lymphedema of both lower extremities   . Parkinson disease ([place])   . Parkinsonism ([place]) 02/21/2015  . Spinal stenosis   . Tremor    Past Surgical History:  Procedure Laterality Date  . APPENDECTOMY    . CATARACT EXTRACTION    . CHOLECYSTECTOMY    . TONSILLECTOMY    . VASECTOMY       A IV Location/Drains/Wounds Patient Lines/Drains/Airways Status   Active Line/Drains/Airways    Name:   Placement date:   Placement time:   Site:   Days:   Peripheral IV 09/10/19 Left Hand   09/10/19    2058    Hand   1          Intake/Output Last 24 hours  Intake/Output Summary (Last 24 hours) at 09/11/2019 1834 Last data filed at 09/11/2019 4315 Gross per 24 hour  Intake -  Output 150 ml  Net -150 ml    Labs/Imaging Results for orders placed or performed during the hospital encounter of 09/10/19 (from the past 48 hour(s))  CBC with Differential     Status: Abnormal   Collection Time: 09/10/19  1:41 PM  Result Value Ref Range   WBC 5.1 4.0 - 10.5 K/uL   RBC 3.73 (L) 4.22 - 5.81 MIL/uL   Hemoglobin 10.3 (L) 13.0 - 17.0 g/dL   HCT 32.9 (L) 39.0 - 52.0 %   MCV 88.2 80.0 -  100.0 fL   MCH 27.6 26.0 - 34.0 pg   MCHC 31.3 30.0 - 36.0 g/dL   RDW 17.9 (H) 11.5 - 15.5 %   Platelets 207 150 - 400 K/uL   nRBC 0.0 0.0 - 0.2 %   Neutrophils Relative % 71 %   Neutro Abs 3.6 1.7 - 7.7 K/uL   Lymphocytes Relative 14 %   Lymphs Abs 0.7 0.7 - 4.0 K/uL   Monocytes Relative 13 %   Monocytes Absolute 0.7 0.1 - 1.0 K/uL   Eosinophils Relative 1 %   Eosinophils Absolute 0.0 0.0 - 0.5 K/uL   Basophils Relative 0 %   Basophils Absolute 0.0 0.0 - 0.1 K/uL   Immature Granulocytes 1 %   Abs Immature Granulocytes 0.03 0.00 - 0.07 K/uL    Comment: Performed at [facility], [address] [place], [place] [address]  Comprehensive metabolic panel      Status: Abnormal   Collection Time: 09/10/19  1:41 PM  Result Value Ref Range   Sodium 127 (L) 135 - 145 mmol/L   Potassium 3.6 3.5 - 5.1 mmol/L   Chloride 95 (L) 98 - 111 mmol/L   CO2 22 22 - 32 mmol/L   Glucose, Bld 124 (H) 70 - 99 mg/dL   BUN 17 8 - 23 mg/dL   Creatinine, Ser 1.00 0.61 - 1.24 mg/dL   Calcium 7.5 (L) 8.9 - 10.3 mg/dL   Total Protein 6.7 6.5 - 8.1 g/dL   Albumin 3.2 (L) 3.5 - 5.0 g/dL   AST 25 15 - 41 U/L   ALT 8 0 - 44 U/L   Alkaline Phosphatase 84 38 - 126 U/L   Total Bilirubin 1.2 0.3 - 1.2 mg/dL   GFR calc non Af Amer >60 >60 mL/min   GFR calc Af Amer >60 >60 mL/min   Anion gap 10 5 - 15    Comment: Performed at [facility], [address] [place], [place] [address]  Lipase, blood     Status: None   Collection Time: 09/10/19  1:41 PM  Result Value Ref Range   Lipase 19 11 - 51 U/L    Comment: Performed at [facility], [address] [place],  [address]  Urinalysis, Complete w Microscopic     Status: Abnormal   Collection Time: 09/10/19 10:57 PM  Result Value Ref Range   Color, Urine AMBER (A) YELLOW    Comment: BIOCHEMICALS MAY BE AFFECTED BY COLOR   APPearance CLOUDY (A) CLEAR   Specific Gravity, Urine 1.021 1.005 - 1.030   pH 5.0 5.0 - 8.0   Glucose, UA NEGATIVE NEGATIVE mg/dL   Hgb urine dipstick MODERATE (A) NEGATIVE   Bilirubin Urine NEGATIVE NEGATIVE   Ketones, ur 5 (A) NEGATIVE mg/dL   Protein, ur 100 (A) NEGATIVE mg/dL   Nitrite NEGATIVE NEGATIVE   Leukocytes,Ua NEGATIVE NEGATIVE   RBC / HPF >50 (H) 0 - 5 RBC/hpf   WBC, UA 11-20 0 - 5 WBC/hpf   Bacteria, UA NONE SEEN NONE SEEN   Squamous Epithelial / LPF NONE SEEN 0 - 5   Mucus PRESENT    Hyaline Casts, UA PRESENT     Comment: Performed at [facility], [address] [place], [place] [address]  SARS CORONAVIRUS 2 (TAT 6-24 HRS) Nasopharyngeal Nasopharyngeal Swab     Status: None   Collection Time: 09/11/19  1:18 AM   Specimen: Nasopharyngeal  Swab  Result Value Ref Range   SARS  Coronavirus 2 NEGATIVE NEGATIVE    Comment: (NOTE) SARS-CoV-2 target nucleic acids are NOT DETECTED. The SARS-CoV-2 RNA is generally detectable in upper and lower respiratory specimens during the acute phase of infection. Negative results do not preclude SARS-CoV-2 infection, do not rule out co-infections with other pathogens, and should not be used as the sole basis for treatment or other patient management decisions. Negative results must be combined with clinical observations, patient history, and epidemiological information. The expected result is Negative. Fact Sheet for Patients: SugarRoll.be Fact Sheet for Healthcare Providers: https://www.woods-mathews.com/ This test is not yet approved or cleared by the [place] FDA and  has been authorized for detection and/or diagnosis of SARS-CoV-2 by FDA under an Emergency Use Authorization (EUA). This EUA will remain  in effect (meaning this test can be used) for the duration of the COVID-19 declaration under Section 56 4(b)(1) of the Act, 21 U.S.C. section 360bbb-3(b)(1), unless the authorization is terminated or revoked sooner. Performed at [facility], [place] [address], [place], [place] [address]   Basic metabolic panel     Status: Abnormal   Collection Time: 09/11/19  5:35 AM  Result Value Ref Range   Sodium 131 (L) 135 - 145 mmol/L   Potassium 3.3 (L) 3.5 - 5.1 mmol/L   Chloride 100 98 - 111 mmol/L   CO2 23 22 - 32 mmol/L   Glucose, Bld 92 70 - 99 mg/dL   BUN 26 (H) 8 - 23 mg/dL   Creatinine, Ser 1.22 0.61 - 1.24 mg/dL   Calcium 7.1 (L) 8.9 - 10.3 mg/dL   GFR calc non Af Amer 54 (L) >60 mL/min   GFR calc Af Amer >60 >60 mL/min   Anion gap 8 5 - 15    Comment: Performed at [facility], [address] [place], [place] [address]  CBC     Status: Abnormal   Collection Time: 09/11/19  5:35 AM  Result Value Ref Range   WBC 4.4  4.0 - 10.5 K/uL   RBC 3.13 (L) 4.22 - 5.81 MIL/uL   Hemoglobin 8.8 (L) 13.0 - 17.0 g/dL   HCT 28.1 (L) 39.0 - 52.0 %   MCV 89.8 80.0 - 100.0 fL   MCH 28.1 26.0 - 34.0 pg   MCHC 31.3 30.0 - 36.0 g/dL   RDW 18.3 (H) 11.5 - 15.5 %   Platelets 143 (L) 150 - 400 K/uL   nRBC 0.0 0.0 - 0.2 %    Comment: Performed at [facility], [address], [place], [place] [address]   DG Chest Portable 1 View  Result Date: 09/10/2019 CLINICAL DATA:  Weakness. EXAM: PORTABLE CHEST 1 VIEW COMPARISON:  04/01/2019 FINDINGS: There is an old right clavicle fracture. The heart size is stable. Aortic calcifications are noted. There is mild elevation of the left hemidiaphragm with a possible trace left-sided effusion. There is atelectasis at the lung bases. There is no acute osseous abnormality. There is no pneumothorax. IMPRESSION: Mild elevation of the left hemidiaphragm with possible trace left-sided effusion. Bibasilar atelectasis. Electronically Signed   By: Constance Holster M.D.   On: 09/10/2019 21:31    Pending Labs Unresulted Labs (From admission, onward)    Start     Ordered   09/12/19 9147  Basic metabolic panel  Daily,   STAT     09/11/19 1829   09/12/19 0500  CBC  Daily,   STAT     09/11/19 1829   09/12/19 0500  Magnesium  Daily,   STAT     09/11/19 1829   09/10/19 2342  Urine culture  ONCE - STAT,   STAT     09/10/19 2341          Vitals/Pain Today's Vitals   09/11/19 0345 09/11/19 0611 09/11/19 0830 09/11/19 1100  BP: 104/66 110/76 104/73   Pulse: 71 81 77 84  Resp: (!) 22 20 (!) 24 (!) 23  Temp:      TempSrc:      SpO2: 100% 97% 93% 98%  Weight:      Height:      PainSc:        Isolation Precautions No active isolations  Medications Medications  aspirin chewable tablet 81 mg (81 mg Oral Given 09/11/19 0953)  irbesartan (AVAPRO) tablet 37.5 mg (37.5 mg Oral Given 09/11/19 0953)  simvastatin (ZOCOR) tablet 10 mg (10 mg Oral Given 09/11/19 1832)  pantoprazole  (PROTONIX) EC tablet 40 mg (40 mg Oral Given 09/11/19 0952)  simethicone (MYLICON) chewable tablet 80 mg (has no administration in time range)  finasteride (PROSCAR) tablet 5 mg (5 mg Oral Given 09/11/19 0955)  tamsulosin (FLOMAX) capsule 0.4 mg (0.4 mg Oral Given 09/11/19 0953)  Carbidopa-Levodopa ER 48.75-195 MG CPCR 1 capsule (1 capsule Oral Given 09/11/19 1833)  methocarbamol (ROBAXIN) tablet 500 mg (has no administration in time range)  calcium citrate (CALCITRATE - dosed in mg elemental calcium) tablet 200 mg of elemental calcium (200 mg of elemental calcium Oral Given 09/11/19 0955)  niacin tablet 500 mg (500 mg Oral Given 09/11/19 0829)  ascorbic acid (VITAMIN C) tablet 500 mg (500 mg Oral Given 09/11/19 0952)  loratadine (CLARITIN) tablet 10 mg (10 mg Oral Given 09/11/19 0952)  fluticasone (FLONASE) 50 MCG/ACT nasal spray 2 spray (2 sprays Each Nare Not Given 09/11/19 1000)  fluticasone furoate-vilanterol (BREO ELLIPTA) 100-25 MCG/INH 1 puff (1 puff Inhalation Given 09/11/19 0959)  ipratropium (ATROVENT) 0.03 % nasal spray 1 spray (1 spray Each Nare Not Given 09/11/19 1001)  enoxaparin (LOVENOX) injection 40 mg (40 mg Subcutaneous Given 09/11/19 0829)  0.9 %  sodium chloride infusion ( Intravenous New Bag/Given 09/11/19 1016)  traZODone (DESYREL) tablet 25 mg (has no administration in time range)  magnesium hydroxide (MILK OF MAGNESIA) suspension 30 mL (has no administration in time range)  ondansetron (ZOFRAN) tablet 4 mg (has no administration in time range)    Or  ondansetron (ZOFRAN) injection 4 mg (has no administration in time range)  umeclidinium bromide (INCRUSE ELLIPTA) 62.5 MCG/INH 1 puff (1 puff Inhalation Given 09/11/19 0958)  sodium chloride flush (NS) 0.9 % injection 3 mL (3 mLs Intravenous Given 09/10/19 2230)  sodium chloride 0.9 % bolus 1,000 mL (0 mLs Intravenous Stopped 09/10/19 2230)  cefTRIAXone (ROCEPHIN) 2 g in sodium chloride 0.9 % 100 mL IVPB (0 g Intravenous Stopped 09/11/19  0036)  0.9 %  sodium chloride infusion (1,000 mLs Intravenous New Bag/Given 09/10/19 2357)  ondansetron (ZOFRAN) injection 4 mg (4 mg Intravenous Given 09/11/19 0001)  potassium chloride (KLOR-CON) packet 40 mEq (40 mEq Oral Given 09/11/19 1129)    Mobility walks with person assist Moderate fall risk   Focused Assessments    R Recommendations: See Admitting Provider Note  Report given to:

## 2019-09-11 NOTE — Consult Note (Signed)
Adam Moss  [phone] [phone]) [phone]  ID: Adam Moss OB: [birth year]  MR#: [id number]  [id number]  Patient Care Team: Perrin Maltese, MD as PCP - General (Internal Medicine)  CHIEF COMPLAINT: Kappa chain myeloma, worsening lower extremity edema.  INTERVAL HISTORY: Patient is an 86 year old male who is actively receiving treatment with Revlimid 10 mg daily for kappa chain myeloma.  Patient states over the past week he has noted worsening lower extremity edema as well as increased weakness and fatigue making it difficult to walk.  He has a fair appetite, but denies weight loss.  He continues to have a resting tremor secondary to Parkinson's.  He has no other neurologic complaints.  He denies any recent fevers.  He has no chest pain, shortness of breath, cough, or hemoptysis.  He denies any nausea, vomiting, or diarrhea.  He does admit to increased constipation.  He has no urinary complaints.  Patient otherwise feels well and offers no further specific complaints today.  REVIEW OF SYSTEMS:   Review of Systems  Constitutional: Positive for malaise/fatigue. Negative for fever and weight loss.  Respiratory: Negative.  Negative for cough, hemoptysis and shortness of breath.   Cardiovascular: Positive for leg swelling. Negative for chest pain.  Gastrointestinal: Negative.  Negative for abdominal pain, blood in stool and melena.  Genitourinary: Negative.  Negative for dysuria.  Musculoskeletal: Positive for back pain.  Skin: Negative.  Negative for rash.  Neurological: Positive for weakness. Negative for dizziness, focal weakness and headaches.  Psychiatric/Behavioral: Negative.  The patient is not nervous/anxious.     As per HPI. Otherwise, a complete review of systems is negative.  PAST MEDICAL HISTORY: Past Medical History:  Diagnosis Date  . Cancer ([place])    skin  . COPD (chronic obstructive pulmonary disease) ([place])   . Hyperlipemia   .  Hypertension   . Lymphedema of both lower extremities   . Parkinson disease ([place])   . Parkinsonism ([place]) 02/21/2015  . Spinal stenosis   . Tremor     PAST SURGICAL HISTORY: Past Surgical History:  Procedure Laterality Date  . APPENDECTOMY    . CATARACT EXTRACTION    . CHOLECYSTECTOMY    . TONSILLECTOMY    . VASECTOMY      FAMILY HISTORY: Family History  Problem Relation Age of Onset  . Cancer Mother   . Heart disease Father     ADVANCED DIRECTIVES (Y/N):  @ADVDIR @  HEALTH MAINTENANCE: Social History   Tobacco Use  . Smoking status: Never Smoker  . Smokeless tobacco: Never Used  Substance Use Topics  . Alcohol use: No    Alcohol/week: 0.0 standard drinks  . Drug use: No     Colonoscopy:  PAP:  Bone density:  Lipid panel:  Allergies  Allergen Reactions  . Carbidopa-Levodopa Other (See Comments)    Severe stomach pains, can take rytary  . Oxycodone-Acetaminophen Other (See Comments)    "boils on skin"  . Tyloxapol   . Acetaminophen Rash, Nausea And Vomiting and Hives  . Pramipexole Nausea Only    Current Facility-Administered Medications  Medication Dose Route Frequency Provider Last Rate Last Admin  . 0.9 %  sodium chloride infusion   Intravenous Continuous Mansy, Arvella Merles, MD 75 mL/hr at 09/11/19 1016 New Bag at 09/11/19 1016  . ascorbic acid (VITAMIN C) tablet 500 mg  500 mg Oral Daily Mansy, Jan A, MD   500 mg at 09/11/19 [id number]  . aspirin chewable tablet 81 mg  81 mg  Oral Daily Mansy, Jan A, MD   81 mg at 09/11/19 0953  . calcium citrate (CALCITRATE - dosed in mg elemental calcium) tablet 200 mg of elemental calcium  1 tablet Oral Daily Mansy, Jan A, MD   200 mg of elemental calcium at 09/11/19 0955  . Carbidopa-Levodopa ER 48.75-195 MG CPCR 1 capsule  1 capsule Oral 5 X Daily Mansy, Jan A, MD   1 capsule at 09/11/19 0952  . enoxaparin (LOVENOX) injection 40 mg  40 mg Subcutaneous Q24H Mansy, Jan A, MD   40 mg at 09/11/19 0829  . finasteride (PROSCAR) tablet  5 mg  5 mg Oral Daily Mansy, Jan A, MD   5 mg at 09/11/19 0955  . fluticasone (FLONASE) 50 MCG/ACT nasal spray 2 spray  2 spray Each Nare Daily Mansy, Jan A, MD      . fluticasone furoate-vilanterol (BREO ELLIPTA) 100-25 MCG/INH 1 puff  1 puff Inhalation q morning - 10a Mansy, Arvella Merles, MD   1 puff at 09/11/19 0959  . ipratropium (ATROVENT) 0.03 % nasal spray 1 spray  1 spray Each Nare BID Mansy, Jan A, MD      . irbesartan (AVAPRO) tablet 37.5 mg  37.5 mg Oral Daily Mansy, Jan A, MD   37.5 mg at 09/11/19 0953  . loratadine (CLARITIN) tablet 10 mg  10 mg Oral Daily Mansy, Jan A, MD   10 mg at 09/11/19 [id number]  . magnesium hydroxide (MILK OF MAGNESIA) suspension 30 mL  30 mL Oral Daily PRN Mansy, Jan A, MD      . methocarbamol (ROBAXIN) tablet 500 mg  500 mg Oral Q8H PRN Mansy, Jan A, MD      . niacin tablet 500 mg  500 mg Oral BH-q7a Mansy, Jan A, MD   500 mg at 09/11/19 [id number]  . ondansetron (ZOFRAN) tablet 4 mg  4 mg Oral Q6H PRN Mansy, Jan A, MD       Or  . ondansetron [facility]) injection 4 mg  4 mg Intravenous Q6H PRN Mansy, Jan A, MD      . pantoprazole (PROTONIX) EC tablet 40 mg  40 mg Oral Daily Mansy, Jan A, MD   40 mg at 09/11/19 [id number]  . simethicone (MYLICON) chewable tablet 80 mg  80 mg Oral QID PRN Mansy, Jan A, MD      . simvastatin (ZOCOR) tablet 10 mg  10 mg Oral q1800 Mansy, Jan A, MD      . tamsulosin [facility]) capsule 0.4 mg  0.4 mg Oral Daily Mansy, Jan A, MD   0.4 mg at 09/11/19 0953  . traZODone (DESYREL) tablet 25 mg  25 mg Oral QHS PRN Mansy, Jan A, MD      . umeclidinium bromide (INCRUSE ELLIPTA) 62.5 MCG/INH 1 puff  1 puff Inhalation q morning - 10a Enzo Bi, MD   1 puff at 09/11/19 [id number]   Current Outpatient Medications  Medication Sig Dispense Refill  . aspirin 81 MG tablet Take 81 mg by mouth daily.    . Calcium Carbonate-Vitamin D (OYSTER SHELL CALCIUM/D) 250-125 MG-UNIT TABS Take by mouth.    . Calcium Citrate 250 MG TABS Take 1 tablet by mouth daily.     .  Carbidopa-Levodopa ER (RYTARY) 48.75-195 MG CPCR Take 195 mg by mouth 5 (five) times daily. 450 capsule 3  . cetirizine (ZYRTEC) 10 MG tablet Take 10 mg by mouth daily.    . finasteride (PROSCAR) 5 MG tablet Take 5 mg by mouth  daily.    . fluticasone (FLONASE) 50 MCG/ACT nasal spray Place into both nostrils daily.    . fluticasone furoate-vilanterol (BREO ELLIPTA) 100-25 MCG/INH AEPB USE 1 INHALATION ORALLY    DAILY 180 each 3  . Fluticasone-Umeclidin-Vilant (TRELEGY ELLIPTA) 100-62.5-25 MCG/INH AEPB Trelegy Ellipta 100-62.5-25 MCG/INH Inhalation Aerosol Powder Breath Activated QTY: 3 inhaler Days: 90 Refills: 3  Written: 08/02/19 Patient Instructions: inhale 1 puff by mouth once daily. Rinse mouth well after use.    . furosemide (LASIX) 20 MG tablet Take 20 mg by mouth daily.     Marland Kitchen ipratropium (ATROVENT) 0.03 % nasal spray Place 0.03 mLs into both nostrils 3 (three) times daily as needed.  11  . lenalidomide (REVLIMID) 10 MG capsule Take 1 capsule (10 mg total) by mouth daily. 28 capsule 0  . meloxicam (MOBIC) 15 MG tablet Take 15 mg by mouth daily.    . niacin 500 MG tablet Take 500 mg by mouth every morning.     . olmesartan (BENICAR) 20 MG tablet Take 20 mg by mouth daily.    Marland Kitchen omeprazole (PRILOSEC) 40 MG capsule Take 40 mg by mouth daily.    . simvastatin (ZOCOR) 10 MG tablet Take 10 mg by mouth daily.    . tamsulosin (FLOMAX) 0.4 MG CAPS capsule Take 0.4 mg by mouth daily.     . vitamin C (ASCORBIC ACID) 500 MG tablet Take 500 mg by mouth daily.    . cyanocobalamin (,VITAMIN B-12,) 1000 MCG/ML injection     . doxycycline (VIBRA-TABS) 100 MG tablet Take 100 mg by mouth 2 (two) times daily.    . methocarbamol (ROBAXIN) 500 MG tablet methocarbamol 500 mg tablet  TAKE 1 TABLET BY MOUTH EVERY DAY    . PROAIR HFA 108 (90 Base) MCG/ACT inhaler INHALE 2 PUFFS INTO THE LUNGS AS NEEDED. (Patient not taking: Reported on 08/06/2019) 17 Inhaler 3  . Simethicone (GAS RELIEF PO) Take 1 Dose by mouth as  needed.       OBJECTIVE: Vitals:   09/11/19 0830 09/11/19 1100  BP: 104/73   Pulse: 77 84  Resp: (!) 24 (!) 23  Temp:    SpO2: 93% 98%     Body mass index is 26.81 kg/m.    ECOG FS:2 - Symptomatic, <50% confined to bed  General: Ill-appearing, no acute distress. Eyes: Pink conjunctiva, anicteric sclera. HEENT: Normocephalic, moist mucous membranes. Lungs: No audible wheezing or coughing. Heart: Regular rate and rhythm. Abdomen: Soft, nontender, no obvious distention. Musculoskeletal: No edema, cyanosis, or clubbing. Neuro: Alert, answering all questions appropriately. Cranial nerves grossly intact. Skin: No rashes or petechiae noted. Psych: Normal affect.  LAB RESULTS:  Lab Results  Component Value Date   NA 131 (L) 09/11/2019   K 3.3 (L) 09/11/2019   CL 100 09/11/2019   CO2 23 09/11/2019   GLUCOSE 92 09/11/2019   BUN 26 (H) 09/11/2019   CREATININE 1.22 09/11/2019   CALCIUM 7.1 (L) 09/11/2019   PROT 6.7 09/10/2019   ALBUMIN 3.2 (L) 09/10/2019   AST 25 09/10/2019   ALT 8 09/10/2019   ALKPHOS 84 09/10/2019   BILITOT 1.2 09/10/2019   GFRNONAA 54 (L) 09/11/2019   GFRAA >60 09/11/2019    Lab Results  Component Value Date   WBC 4.4 09/11/2019   NEUTROABS 3.6 09/10/2019   HGB 8.8 (L) 09/11/2019   HCT 28.1 (L) 09/11/2019   MCV 89.8 09/11/2019   PLT 143 (L) 09/11/2019     STUDIES: DG Chest Portable 1  View  Result Date: 09/10/2019 CLINICAL DATA:  Weakness. EXAM: PORTABLE CHEST 1 VIEW COMPARISON:  04/01/2019 FINDINGS: There is an old right clavicle fracture. The heart size is stable. Aortic calcifications are noted. There is mild elevation of the left hemidiaphragm with a possible trace left-sided effusion. There is atelectasis at the lung bases. There is no acute osseous abnormality. There is no pneumothorax. IMPRESSION: Mild elevation of the left hemidiaphragm with possible trace left-sided effusion. Bibasilar atelectasis. Electronically Signed   By: Constance Holster M.D.   On: 09/10/2019 21:31    ASSESSMENT: Kappa chain myeloma, worsening lower extremity edema.  PLAN:    1.  Kappa chain myeloma: Hold his Revlimid while inpatient.  He has been instructed to restart treatment upon discharge.  He last received Zometa on September 03, 2019.  Patient has been instructed to keep his previously scheduled follow-up appointment on October 01, 2019. 2.  Anemia: Patient's hemoglobin continues to trend down and is now 8.8.  Have ordered iron stores for completeness.  Continue to monitor and transfuse if hemoglobin falls below 7.0. 3.  Possible UTI: Continue IV antibiotics as ordered. 4.  Hyponatremia: Sodium levels has trended down to 131.  Gentle IV hydration as ordered. 5.  Lower extremity edema: Bilateral, therefore do not suspect DVT at this time.  Continue to monitor and treat conservatively.  Appreciate consult, call with questions.   Lloyd Huger, MD   09/11/2019 1:10 PM

## 2019-09-11 NOTE — ED Notes (Signed)
Pt took his home parkinson med out of his bag. Pt takes med 5 times a day.

## 2019-09-11 NOTE — ED Notes (Signed)
Pt resting quietly, attempting to sleep. Pt denies needs at this time. Pt daughter in law updated on pt status.

## 2019-09-11 NOTE — Progress Notes (Signed)
PROGRESS NOTE    Josmar Messimer  ZGY:[id number] DOB: [birth year] DOA: 09/10/2019 PCP: Perrin Maltese, MD    Assessment & Plan:   Active Problems:   UTI (urinary tract infection)    Adam Moss  is a 86 y.o. Caucasian male with a known history of COPD, hypertension, dyslipidemia and Parkinson's disease, as well as multiple myeloma on Zometa and Revlimid, who presented from independent living in [place] at [place] to the emergency room with acute onset of leg swelling as well as constipation.  He stated that he felt dehydrated and fatigued.  He was not able to ambulate 2/2 pain at the balls of his feet.  1.  Mild acute kidney injury, likely prerenal from volume depletion and dehydration.  The patient will be placed on hydration with IV normal saline and will follow his BMP.  2.  Hyponatremia.  This likely hypovolemic.  Management as above with IV hydration and will follow sodium levels.  # BLE swelling 2/2 venous stasis Pt reported using lymphedema-ware at home which helps. --Apply ACE wrap from above the toes to beneath the knees.  3..  UTI, ruled out UA neg for infection.  Some mild burning with initiation of urine stream likely due to BPH and concentrated urine  4.  Parkinson's disease.  We will continue his sinemet CR  5.  BPH.  We will continue his Proscar and Flomax.  6.  Hypertension.  We will continue Avapro with hydration and close monitoring with renal functions.  7.  Dyslipidemia.  Continue statin therapy..   DVT prophylaxis: Lovenox SQ Code Status: DNR  Family Communication: not today Disposition Plan: Home likely tomorrow   Subjective and Interval History:  Pt complained of intermittent pain at the balls of both feet.  Slight burning at initiation of urine stream.  Reported feeling dehydrated and admitting to not drinking enough fluids due to not wanting to get up from his chair.  No fever, dyspnea, chest pain, abdominal pain, N/V/D.   Objective:  Vitals:   09/11/19 0830 09/11/19 1100 09/11/19 1845 09/11/19 2207  BP: 104/73  104/71 110/72  Pulse: 77 84 85 83  Resp: (!) 24 (!) _0 Temp:    98.2 F (36.8 C)  TempSrc:    Oral  SpO2: 93% 98% 97% 99%  Weight:      Height:        Intake/Output Summary (Last 24 hours) at 09/12/2019 0130 Last data filed at 09/11/2019 1000 Gross per 24 hour  Intake 993.02 ml  Output 150 ml  Net 843.02 ml   Filed Weights   09/10/19 1332  Weight: 87.2 kg    Examination:   Constitutional: NAD, AAOx3 HEENT: conjunctivae and lids normal, EOMI CV: RRR no M,R,G. Distal pulses +2.  No cyanosis.   RESP: CTA B/L, normal respiratory effort  GI: +BS, NTND Extremities: 3+ pitting edema in BLE, both feet swollen, no erythema in any joints in feet SKIN: warm, dry and intact, venous stasis changes in both lower legs Neuro: II - XII grossly intact.  Sensation intact Psych: Normal mood and affect.  Appropriate judgement and reason   Data Reviewed: I have personally reviewed following labs and imaging studies  CBC: Recent Labs  Lab 09/10/19 1341 09/11/19 0535  WBC 5.1 4.4  NEUTROABS 3.6  --   HGB 10.3* 8.8*  HCT 32.9* 28.1*  MCV 88.2 89.8  PLT 207 591*   Basic Metabolic Panel: Recent Labs  Lab 09/10/19 1341 09/11/19 0535  NA 127* 131*  K 3.6 3.3*  CL 95* 100  CO2 22 23  GLUCOSE 124* 92  BUN 17 26*  CREATININE 1.00 1.22  CALCIUM 7.5* 7.1*   GFR: Estimated Creatinine Clearance: 48.9 mL/min (by C-G formula based on SCr of 1.22 mg/dL). Liver Function Tests: Recent Labs  Lab 09/10/19 1341  AST 25  ALT 8  ALKPHOS 84  BILITOT 1.2  PROT 6.7  ALBUMIN 3.2*   Recent Labs  Lab 09/10/19 1341  LIPASE 19   No results for input(s): AMMONIA in the last 168 hours. Coagulation Profile: No results for input(s): INR, PROTIME in the last 168 hours. Cardiac Enzymes: No results for input(s): CKTOTAL, CKMB, CKMBINDEX, TROPONINI in the last 168 hours. BNP (last 3 results) No results  for input(s): PROBNP in the last 8760 hours. HbA1C: No results for input(s): HGBA1C in the last 72 hours. CBG: No results for input(s): GLUCAP in the last 168 hours. Lipid Profile: No results for input(s): CHOL, HDL, LDLCALC, TRIG, CHOLHDL, LDLDIRECT in the last 72 hours. Thyroid Function Tests: No results for input(s): TSH, T4TOTAL, FREET4, T3FREE, THYROIDAB in the last 72 hours. Anemia Panel: No results for input(s): VITAMINB12, FOLATE, FERRITIN, TIBC, IRON, RETICCTPCT in the last 72 hours. Sepsis Labs: No results for input(s): PROCALCITON, LATICACIDVEN in the last 168 hours.  Recent Results (from the past 240 hour(s))  SARS CORONAVIRUS 2 (TAT 6-24 HRS) Nasopharyngeal Nasopharyngeal Swab     Status: None   Collection Time: 09/11/19  1:18 AM   Specimen: Nasopharyngeal Swab  Result Value Ref Range Status   SARS Coronavirus 2 NEGATIVE NEGATIVE Final    Comment: (NOTE) SARS-CoV-2 target nucleic acids are NOT DETECTED. The SARS-CoV-2 RNA is generally detectable in upper and lower respiratory specimens during the acute phase of infection. Negative results do not preclude SARS-CoV-2 infection, do not rule out co-infections with other pathogens, and should not be used as the sole basis for treatment or other patient management decisions. Negative results must be combined with clinical observations, patient history, and epidemiological information. The expected result is Negative. Fact Sheet for Patients: https://www.fda.gov/media/138098/download Fact Sheet for Healthcare Providers: https://www.fda.gov/media/138095/download This test is not yet approved or cleared by the [place] FDA and  has been authorized for detection and/or diagnosis of SARS-CoV-2 by FDA under an Emergency Use Authorization (EUA). This EUA will remain  in effect (meaning this test can be used) for the duration of the COVID-19 declaration under Section 56 4(b)(1) of the Act, 21 U.S.C. section 360bbb-3(b)(1),  unless the authorization is terminated or revoked sooner. Performed at [facility], [address] [place], [place] [address]       Radiology Studies: DG Chest Portable 1 View  Result Date: 09/10/2019 CLINICAL DATA:  Weakness. EXAM: PORTABLE CHEST 1 VIEW COMPARISON:  04/01/2019 FINDINGS: There is an old right clavicle fracture. The heart size is stable. Aortic calcifications are noted. There is mild elevation of the left hemidiaphragm with a possible trace left-sided effusion. There is atelectasis at the lung bases. There is no acute osseous abnormality. There is no pneumothorax. IMPRESSION: Mild elevation of the left hemidiaphragm with possible trace left-sided effusion. Bibasilar atelectasis. Electronically Signed   By: Christopher  Green M.D.   On: 09/10/2019 21:31     Scheduled Meds: . vitamin C  500 mg Oral Daily  . aspirin  81 mg Oral Daily  . calcium citrate  1 tablet Oral Daily  . Carbidopa-Levodopa ER  1 capsule Oral 5 X Daily  .   enoxaparin (LOVENOX) injection  40 mg Subcutaneous Q24H  . finasteride  5 mg Oral Daily  . fluticasone  2 spray Each Nare Daily  . fluticasone furoate-vilanterol  1 puff Inhalation q morning - 10a  . ipratropium  1 spray Each Nare BID  . irbesartan  37.5 mg Oral Daily  . loratadine  10 mg Oral Daily  . niacin  500 mg Oral BH-q7a  . pantoprazole  40 mg Oral Daily  . simvastatin  10 mg Oral q1800  . tamsulosin  0.4 mg Oral Daily  . umeclidinium bromide  1 puff Inhalation q morning - 10a   Continuous Infusions: . sodium chloride 75 mL/hr at 09/11/19 2219     LOS: 1 day     Tina Lai, MD [facility] If 7PM-7AM, please contact night-coverage 09/12/2019, 1:30 AM    

## 2019-09-11 NOTE — H&P (Addendum)
[facility] [place] NAME: Adam Moss    MR#:  [id number]  DATE OF BIRTH:  [birth year]  DATE OF ADMISSION:  09/10/2019  PRIMARY CARE PHYSICIAN: Perrin Maltese, MD   REQUESTING/REFERRING PHYSICIAN: Duffy Bruce, MD CHIEF COMPLAINT:   Chief Complaint  Patient presents with  . Constipation  . Leg Swelling    HISTORY OF PRESENT ILLNESS:  Adam Moss  is a 86 y.o. male with a known history of COPD, hypertension, dyslipidemia and Parkinson's disease, as well as multiple myeloma on Zometa and Revlimid, who presented to the emergency room with acute onset of leg swelling as well as constipation.  He stated that he felt dehydrated and fatigued.  He was not able to ambulate secondarily at the [facility] where he resides.  He denies any nausea or vomiting or diarrhea.  His last bowel movement was yesterday.  He admits to dysuria and occasional difficulty stopping his urinary stream.  No fever or chills.  No cough or wheezing or hemoptysis.  No chest pain or palpitations.  Upon presentation to the emergency room his blood pressure was 93/60 and that improved with hydration.  Vital signs otherwise were within normal.  Labs revealed mild hyponatremia and hypochloremia and a borderline potassium of 3.6.  CBC showed mild anemia with hemoglobin of 10.3 hematocrit 32.9.  Urinalysis showed more than 50 RBCs and 11-20 WBCs with 5 ketones and 100 protein.  Urine culture was sent.  Portable chest ray showed mild elevation of the left diaphragm with possible trace left sided pleural effusion and bibasilar atelectasis.   The patient was given IV Rocephin, 1 L bolus of IV normal saline and 4 mg of IV Zofran.  He will be admitted to medical monitored bed for further evaluation and management. PAST MEDICAL HISTORY:   Past Medical History:  Diagnosis Date  . Cancer ([place])    skin  . COPD (chronic obstructive pulmonary disease) ([place])   . Hyperlipemia   .  Hypertension   . Lymphedema of both lower extremities   . Parkinson disease ([place])   . Parkinsonism ([place]) 02/21/2015  . Spinal stenosis   . Tremor   History of multiple myeloma followed by Dr. Grayland Ormond.  PAST SURGICAL HISTORY:   Past Surgical History:  Procedure Laterality Date  . APPENDECTOMY    . CATARACT EXTRACTION    . CHOLECYSTECTOMY    . TONSILLECTOMY    . VASECTOMY      SOCIAL HISTORY:   Social History   Tobacco Use  . Smoking status: Never Smoker  . Smokeless tobacco: Never Used  Substance Use Topics  . Alcohol use: No    Alcohol/week: 0.0 standard drinks    FAMILY HISTORY:   Family History  Problem Relation Age of Onset  . Cancer Mother   . Heart disease Father     DRUG ALLERGIES:   Allergies  Allergen Reactions  . Carbidopa-Levodopa Other (See Comments)    Severe stomach pains, can take rytary  . Oxycodone-Acetaminophen Other (See Comments)    "boils on skin"  . Tyloxapol   . Acetaminophen Rash, Nausea And Vomiting and Hives  . Pramipexole Nausea Only    REVIEW OF SYSTEMS:   ROS As per history of present illness. All pertinent systems were reviewed above. Constitutional,  HEENT, cardiovascular, respiratory, GI, GU, musculoskeletal, neuro, psychiatric, endocrine,  integumentary and hematologic systems were reviewed and are otherwise  negative/unremarkable except for positive findings mentioned above in the HPI.  MEDICATIONS AT HOME:   Prior to Admission medications   Medication Sig Start Date End Date Taking? Authorizing Provider  aspirin 81 MG tablet Take 81 mg by mouth daily.   Yes [provider]  Calcium Carbonate-Vitamin D (OYSTER SHELL CALCIUM/D) 250-125 MG-UNIT TABS Take by mouth.   Yes [provider]  Calcium Citrate 250 MG TABS Take 1 tablet by mouth daily.    Yes [provider]  Carbidopa-Levodopa ER (RYTARY) 48.75-195 MG CPCR Take 195 mg by mouth 5 (five) times daily. 08/21/19  Yes Star Age, MD    cetirizine (ZYRTEC) 10 MG tablet Take 10 mg by mouth daily.   Yes [provider]  finasteride (PROSCAR) 5 MG tablet Take 5 mg by mouth daily.   Yes [provider]  fluticasone (FLONASE) 50 MCG/ACT nasal spray Place into both nostrils daily.   Yes [provider]  fluticasone furoate-vilanterol (BREO ELLIPTA) 100-25 MCG/INH AEPB USE 1 INHALATION ORALLY    DAILY 07/06/19  Yes Lauraine Rinne, NP  Fluticasone-Umeclidin-Vilant (TRELEGY ELLIPTA) 100-62.5-25 MCG/INH AEPB Trelegy Ellipta 100-62.5-25 MCG/INH Inhalation Aerosol Powder Breath Activated QTY: 3 inhaler Days: 90 Refills: 3  Written: 08/02/19 Patient Instructions: inhale 1 puff by mouth once daily. Rinse mouth well after use. 08/02/19  Yes [provider]  furosemide (LASIX) 20 MG tablet Take 20 mg by mouth daily.  05/06/19  Yes [provider]  ipratropium (ATROVENT) 0.03 % nasal spray Place 0.03 mLs into both nostrils 3 (three) times daily as needed. 05/25/17  Yes [provider]  lenalidomide (REVLIMID) 10 MG capsule Take 1 capsule (10 mg total) by mouth daily. 08/14/19  Yes Lloyd Huger, MD  meloxicam (MOBIC) 15 MG tablet Take 15 mg by mouth daily. 06/18/19  Yes [provider]  niacin 500 MG tablet Take 500 mg by mouth every morning.    Yes [provider]  olmesartan (BENICAR) 20 MG tablet Take 20 mg by mouth daily.   Yes [provider]  omeprazole (PRILOSEC) 40 MG capsule Take 40 mg by mouth daily.   Yes [provider]  simvastatin (ZOCOR) 10 MG tablet Take 10 mg by mouth daily.   Yes [provider]  tamsulosin (FLOMAX) 0.4 MG CAPS capsule Take 0.4 mg by mouth daily.    Yes [provider]  vitamin C (ASCORBIC ACID) 500 MG tablet Take 500 mg by mouth daily.   Yes [provider]  cyanocobalamin (,VITAMIN B-12,) 1000 MCG/ML injection  08/02/19   [provider]  doxycycline (VIBRA-TABS) 100 MG tablet Take 100  mg by mouth 2 (two) times daily. 09/09/19   [provider]  methocarbamol (ROBAXIN) 500 MG tablet methocarbamol 500 mg tablet  TAKE 1 TABLET BY MOUTH EVERY DAY    [provider]  PROAIR HFA 108 (90 Base) MCG/ACT inhaler INHALE 2 PUFFS INTO THE LUNGS AS NEEDED. Patient not taking: Reported on 08/06/2019 03/30/18   Flora Lipps, MD  Simethicone (GAS RELIEF PO) Take 1 Dose by mouth as needed.     [provider]      VITAL SIGNS:  Blood pressure 104/66, pulse 71, temperature 98.3 F (36.8 C), temperature source Oral, resp. rate (!) 22, height _0  (1.803 m), weight 87.2 kg, SpO2 100 %.  PHYSICAL EXAMINATION:  Physical Exam  GENERAL:  86 y.o.-year-old patient lying in the bed with no acute distress.  EYES: Pupils equal, round, reactive to light and accommodation. No scleral icterus. Extraocular muscles intact.  HEENT:  Head atraumatic, normocephalic. Oropharynx and nasopharynx clear.  NECK:  Supple, no jugular venous distention. No thyroid enlargement, no tenderness.  LUNGS: Normal breath sounds bilaterally, no wheezing, rales,rhonchi or crepitation. No use of accessory muscles of respiration.  CARDIOVASCULAR: Regular rate and rhythm, S1, S2 normal. No murmurs, rubs, or gallops.  ABDOMEN: Soft, nondistended, nontender. Bowel sounds present. No organomegaly or mass.  EXTREMITIES: 2-3+ bilateral lower extremity pitting soft edema with no cyanosis, or clubbing.  NEUROLOGIC: Cranial nerves II through XII are intact. Muscle strength 5/5 in all extremities. Sensation intact. Gait not checked.  PSYCHIATRIC: The patient is alert and oriented x 3.  Normal affect and good eye contact. SKIN: No obvious rash, lesion, or ulcer.   LABORATORY PANEL:   CBC Recent Labs  Lab 09/10/19 1341  WBC 5.1  HGB 10.3*  HCT 32.9*  PLT 207   ------------------------------------------------------------------------------------------------------------------  Chemistries  Recent Labs   Lab 09/10/19 1341  NA 127*  K 3.6  CL 95*  CO2 22  GLUCOSE 124*  BUN 17  CREATININE 1.00  CALCIUM 7.5*  AST 25  ALT 8  ALKPHOS 84  BILITOT 1.2   ------------------------------------------------------------------------------------------------------------------  Cardiac Enzymes No results for input(s): TROPONINI in the last 168 hours. ------------------------------------------------------------------------------------------------------------------  RADIOLOGY:  DG Chest Portable 1 View  Result Date: 09/10/2019 CLINICAL DATA:  Weakness. EXAM: PORTABLE CHEST 1 VIEW COMPARISON:  04/01/2019 FINDINGS: There is an old right clavicle fracture. The heart size is stable. Aortic calcifications are noted. There is mild elevation of the left hemidiaphragm with a possible trace left-sided effusion. There is atelectasis at the lung bases. There is no acute osseous abnormality. There is no pneumothorax. IMPRESSION: Mild elevation of the left hemidiaphragm with possible trace left-sided effusion. Bibasilar atelectasis. Electronically Signed   By: Constance Holster M.D.   On: 09/10/2019 21:31      IMPRESSION AND PLAN:   1.  Mild acute kidney injury, likely prerenal from volume depletion and dehydration.  The patient will be placed on hydration with IV normal saline and will follow his BMP.  2.  Hyponatremia.  This likely hypovolemic.  Management as above with IV hydration and will follow sodium levels.  3..  UTI.  The patient will be placed on IV Rocephin and will follow urine culture and sensitivity.  4.  Parkinson's disease.  We will continue his sinemet CR  5.  BPH.  We will continue his Proscar and Flomax.  6.  Hypertension.  We will continue Avapro with hydration and close monitoring with renal functions.  7.  Dyslipidemia.  Continue statin therapy..  8.  DVT prophylaxis.  Subcutaneous Lovenox.   All the records are reviewed and case discussed with ED provider. The plan of care  was discussed in details with the patient (and family). I answered all questions. The patient agreed to proceed with the above mentioned plan. Further management will depend upon hospital course.   CODE STATUS: Full code   TOTAL TIME TAKING CARE OF THIS PATIENT: 55 minutes.    Christel Mormon M.D on 09/11/2019 at 4:26 AM  [facility]   From 7 PM-7 AM, contact night-coverage [email or web address]  CC: Primary care physician; Perrin Maltese, MD   Note: This dictation was prepared with Dragon dictation along with smaller phrase technology. Any transcriptional errors that result from this process are unintentional.

## 2019-09-11 NOTE — ED Notes (Signed)
Pt given urinal per request. Offered a new condom cath for pt. Pt declined this time and stated we could try it the next time he needed to urinate. Offered to change bed sheets and place chux under pt and give warm blankets. Pt refused.

## 2019-09-12 ENCOUNTER — Encounter
Admission: RE | Admit: 2019-09-12 | Discharge: 2019-09-12 | Disposition: A | Discharge status: Home / Self Care | Payer: Medicare Other | Source: Ambulatory Visit | Attending: Internal Medicine | Admitting: Internal Medicine

## 2019-09-12 DIAGNOSIS — R5381 Other malaise: Secondary | ICD-10-CM

## 2019-09-12 LAB — CBC
HCT: 27.9 % — ABNORMAL LOW (ref 39.0–52.0)
Hemoglobin: 8.9 g/dL — ABNORMAL LOW (ref 13.0–17.0)
MCH: 28.1 pg (ref 26.0–34.0)
MCHC: 31.9 g/dL (ref 30.0–36.0)
MCV: 88 fL (ref 80.0–100.0)
Platelets: 157 10*3/uL (ref 150–400)
RBC: 3.17 MIL/uL — ABNORMAL LOW (ref 4.22–5.81)
RDW: 18.1 % — ABNORMAL HIGH (ref 11.5–15.5)
WBC: 3.1 10*3/uL — ABNORMAL LOW (ref 4.0–10.5)
nRBC: 0 % (ref 0.0–0.2)

## 2019-09-12 LAB — BASIC METABOLIC PANEL
Anion gap: 8 (ref 5–15)
BUN: 21 mg/dL (ref 8–23)
CO2: 21 mmol/L — ABNORMAL LOW (ref 22–32)
Calcium: 7.2 mg/dL — ABNORMAL LOW (ref 8.9–10.3)
Chloride: 105 mmol/L (ref 98–111)
Creatinine, Ser: 0.99 mg/dL (ref 0.61–1.24)
GFR calc Af Amer: >60 mL/min (ref >60)
GFR calc non Af Amer: >60 mL/min (ref >60)
Glucose, Bld: 108 mg/dL — ABNORMAL HIGH (ref 70–99)
Potassium: 3.4 mmol/L — ABNORMAL LOW (ref 3.5–5.1)
Sodium: 134 mmol/L — ABNORMAL LOW (ref 135–145)

## 2019-09-12 LAB — MAGNESIUM: Magnesium: 1.9 mg/dL (ref 1.7–2.4)

## 2019-09-12 MED ORDER — POTASSIUM CHLORIDE 20 MEQ PO PACK
40.0000 meq | PACK | Freq: Once | ORAL | Status: AC
  Administered 2019-09-12: 12:00:00 40 meq via ORAL
  Filled 2019-09-12: qty 2

## 2019-09-12 NOTE — NC FL2 (Signed)
[place] LEVEL OF CARE SCREENING TOOL     IDENTIFICATION  Patient Name: Adam Moss Birthdate: 09-17-1933 Sex: male Admission Date (Current Location): 09/10/2019  [place] and [place] Number:  Engineering geologist and Address:  [facility], [address], [place], [place] [address]      Provider Number: [id number]  Attending Physician Name and Address:  Enzo Bi, MD  Relative Name and Phone Number:  [name] [phone]    Current Level of Care: Hospital Recommended Level of Care: [place] Prior Approval Number:    Date Approved/Denied:   PASRR Number: [id number]  Discharge Plan: SNF    Current Diagnoses: Patient Active Problem List   Diagnosis Date Noted  . UTI (urinary tract infection) 09/11/2019  . Pain due to onychomycosis of toenails of both feet 08/09/2019  . Pincer nail deformity 08/09/2019  . Hypertrophy of prostate with urinary obstruction and other lower urinary tract symptoms (LUTS) 08/02/2019  . Healthcare maintenance 07/06/2019  . Mixed hyperlipidemia 04/24/2019  . Chronic obstructive pulmonary disease ([place]) 04/24/2019  . Multiple myeloma ([place]) 04/24/2019  . Other lymphedema 04/24/2019  . Edema 04/24/2019  . Essential hypertension 04/24/2019  . Lower extremity edema 04/02/2019  . Pathologic fracture 03/30/2019  . Cough 12/04/2015  . Non-seasonal allergic rhinitis 12/04/2015  . Parkinson disease ([place]) 02/21/2015  . Spinal stenosis in cervical region 11/30/2013  . Tremor 11/30/2013  . Patient risk and functional assessment 04/14/2012  . History of actinic keratoses 10/02/2010  . History of nonmelanoma skin cancer 10/02/2010  . Chest pain, unspecified 03/28/2007    Orientation RESPIRATION BLADDER Height & Weight     Self, Time, Situation, Place  Normal Continent Weight: 87.2 kg Height:  '5\' 11"'  (180.3 cm)  BEHAVIORAL SYMPTOMS/MOOD NEUROLOGICAL BOWEL NUTRITION STATUS      Continent     AMBULATORY STATUS COMMUNICATION OF NEEDS Skin   Extensive Assist Verbally Normal                       Personal Care Assistance Level of Assistance  Dressing     Dressing Assistance: Limited assistance     Functional Limitations Info             SPECIAL CARE FACTORS FREQUENCY  PT (By licensed PT)     PT Frequency: daily              Contractures Contractures Info: Not present    Additional Factors Info  Code Status, Allergies Code Status Info: DNR Allergies Info: Carbidopa-levodopa, Oxycodone-acetaminophen, Tyloxapol, Acetaminophen, Pramipexole           Current Medications (09/12/2019):  This is the current hospital active medication list Current Facility-Administered Medications  Medication Dose Route Frequency Provider Last Rate Last Admin  . 0.9 %  sodium chloride infusion   Intravenous Continuous Mansy, Arvella Merles, MD 75 mL/hr at 09/12/19 1122 New Bag at 09/12/19 1122  . ascorbic acid (VITAMIN C) tablet 500 mg  500 mg Oral Daily Mansy, Jan A, MD   500 mg at 09/11/19 5462  . aspirin chewable tablet 81 mg  81 mg Oral Daily Mansy, Jan A, MD   81 mg at 09/12/19 0958  . calcium citrate (CALCITRATE - dosed in mg elemental calcium) tablet 200 mg of elemental calcium  1 tablet Oral Daily Mansy, Jan A, MD   200 mg of elemental calcium at 09/12/19 1006  . Carbidopa-Levodopa ER 48.75-195 MG CPCR 1 capsule  1 capsule Oral  5 X Daily Mansy, Jan A, MD   1 capsule at 09/12/19 1400  . enoxaparin (LOVENOX) injection 40 mg  40 mg Subcutaneous Q24H Mansy, Jan A, MD   40 mg at 09/12/19 0801  . finasteride (PROSCAR) tablet 5 mg  5 mg Oral Daily Mansy, Jan A, MD   5 mg at 09/12/19 0958  . fluticasone (FLONASE) 50 MCG/ACT nasal spray 2 spray  2 spray Each Nare Daily Mansy, Jan A, MD   2 spray at 09/12/19 1154  . fluticasone furoate-vilanterol (BREO ELLIPTA) 100-25 MCG/INH 1 puff  1 puff Inhalation q morning - 10a Mansy, Jan A, MD   1 puff at 09/12/19 1003  . ipratropium (ATROVENT)  0.03 % nasal spray 1 spray  1 spray Each Nare BID Mansy, Jan A, MD   1 spray at 09/12/19 1007  . irbesartan (AVAPRO) tablet 37.5 mg  37.5 mg Oral Daily Mansy, Jan A, MD   37.5 mg at 09/12/19 1023  . loratadine (CLARITIN) tablet 10 mg  10 mg Oral Daily Mansy, Jan A, MD   10 mg at 09/12/19 0959  . magnesium hydroxide (MILK OF MAGNESIA) suspension 30 mL  30 mL Oral Daily PRN Mansy, Jan A, MD      . methocarbamol (ROBAXIN) tablet 500 mg  500 mg Oral Q8H PRN Mansy, Jan A, MD      . niacin tablet 500 mg  500 mg Oral BH-q7a Mansy, Jan A, MD   500 mg at 09/12/19 0800  . ondansetron (ZOFRAN) tablet 4 mg  4 mg Oral Q6H PRN Mansy, Jan A, MD       Or  . ondansetron [facility]) injection 4 mg  4 mg Intravenous Q6H PRN Mansy, Jan A, MD      . pantoprazole (PROTONIX) EC tablet 40 mg  40 mg Oral Daily Mansy, Jan A, MD   40 mg at 09/12/19 0959  . simethicone (MYLICON) chewable tablet 80 mg  80 mg Oral QID PRN Mansy, Jan A, MD      . simvastatin (ZOCOR) tablet 10 mg  10 mg Oral q1800 Mansy, Jan A, MD   10 mg at 09/11/19 1832  . tamsulosin (FLOMAX) capsule 0.4 mg  0.4 mg Oral Daily Mansy, Jan A, MD   0.4 mg at 09/12/19 1000  . traZODone (DESYREL) tablet 25 mg  25 mg Oral QHS PRN Mansy, Jan A, MD      . umeclidinium bromide (INCRUSE ELLIPTA) 62.5 MCG/INH 1 puff  1 puff Inhalation q morning - 10a Enzo Bi, MD   1 puff at 09/11/19 1003     Discharge Medications: Please see discharge summary for a list of discharge medications.  Relevant Imaging Results:  Relevant Lab Results:   Additional Information EJ#[id number]  Su Hilt, RN

## 2019-09-12 NOTE — Progress Notes (Signed)
Patients daughter " sharon " updated. Patient is to be discharged today back to [facility] , she states she will come anyhow and " take her chances".

## 2019-09-12 NOTE — Progress Notes (Signed)
PROGRESS NOTE    Adam Moss  YIR:[id number] DOB: 1933-10-19 DOA: 09/10/2019 PCP: Perrin Maltese, MD    Assessment & Plan:   Active Problems:   UTI (urinary tract infection)    Adam Moss  is a 86 y.o. Caucasian male with a known history of COPD, hypertension, dyslipidemia and Parkinson's disease, as well as multiple myeloma on Zometa and Revlimid, who presented from independent living in [place] at [place] to the emergency room with acute onset of leg swelling as well as constipation.  He stated that he felt dehydrated and fatigued.  He was not able to ambulate 2/2 pain at the balls of his feet.  1.  Mild acute kidney injury, likely prerenal from volume depletion and dehydration.   S/p IVF hydration with improvement in Cr.  2.  Hyponatremia.  This likely hypovolemic.  Management as above with IV hydration and will follow sodium levels.  # BLE swelling 2/2 venous stasis Pt reported using lymphedema-ware at home which helps.  Refused ACE wrap and requested TEDs.  3..  UTI, ruled out UA neg for infection.  Some mild burning with initiation of urine stream likely due to BPH and concentrated urine  4.  Parkinson's disease.  We will continue his sinemet CR  5.  BPH.  We will continue his Proscar and Flomax.  6.  Hypertension.  We will continue Avapro with hydration and close monitoring with renal functions.  7.  Dyslipidemia.  Continue statin therapy..  # Debility --PT/OT   DVT prophylaxis: Lovenox SQ Code Status: DNR  Family Communication: not today Disposition Plan: SNF rehab tomorrow   Subjective and Interval History:  Pt reported not feeling well, because his legs were so swollen and heavy, but pt refused ACE wrap.  Pt complained of feeling both full and hungry.  Pt reported feeling weak.  No fever, chest pain, abdominal pain, N/V/D.   Objective: Vitals:   09/12/19 0900 09/12/19 1000 09/12/19 1222 09/12/19 1558  BP:  99/69 110/67 118/74  Pulse:   70 77 78  Resp: 18 (!) 22 (!) 21 (!) 22  Temp:  97.6 F (36.4 C)  98 F (36.7 C)  TempSrc:  Oral  Oral  SpO2:  98% 98% 97%  Weight:      Height:        Intake/Output Summary (Last 24 hours) at 09/12/2019 2004 Last data filed at 09/12/2019 1500 Gross per 24 hour  Intake 2726.71 ml  Output 450 ml  Net 2276.71 ml   Filed Weights   09/10/19 1332  Weight: 87.2 kg    Examination:   Constitutional: NAD, AAOx3 HEENT: conjunctivae and lids normal, EOMI CV: RRR no M,R,G. Distal pulses +2.  No cyanosis.   RESP: CTA B/L, normal respiratory effort  GI: +BS, NTND Extremities: 3+ pitting edema in BLE, both feet swollen, no erythema in any joints in feet SKIN: warm, dry and intact, venous stasis changes in both lower legs Neuro: II - XII grossly intact.  Sensation intact   Data Reviewed: I have personally reviewed following labs and imaging studies  CBC: Recent Labs  Lab 09/10/19 1341 09/11/19 0535 09/12/19 0537  WBC 5.1 4.4 3.1*  NEUTROABS 3.6  --   --   HGB 10.3* 8.8* 8.9*  HCT 32.9* 28.1* 27.9*  MCV 88.2 89.8 88.0  PLT 207 143* 500   Basic Metabolic Panel: Recent Labs  Lab 09/10/19 1341 09/11/19 0535 09/12/19 0537  NA 127* 131* 134*  K 3.6 3.3* 3.4*  CL  95* 100 105  CO2 22 23 21*  GLUCOSE 124* 92 108*  BUN 17 26* 21  CREATININE 1.00 1.22 0.99  CALCIUM 7.5* 7.1* 7.2*  MG  --   --  1.9   GFR: Estimated Creatinine Clearance: 60.2 mL/min (by C-G formula based on SCr of 0.99 mg/dL). Liver Function Tests: Recent Labs  Lab 09/10/19 1341  AST 25  ALT 8  ALKPHOS 84  BILITOT 1.2  PROT 6.7  ALBUMIN 3.2*   Recent Labs  Lab 09/10/19 1341  LIPASE 19   No results for input(s): AMMONIA in the last 168 hours. Coagulation Profile: No results for input(s): INR, PROTIME in the last 168 hours. Cardiac Enzymes: No results for input(s): CKTOTAL, CKMB, CKMBINDEX, TROPONINI in the last 168 hours. BNP (last 3 results) No results for input(s): PROBNP in the last 8760  hours. HbA1C: No results for input(s): HGBA1C in the last 72 hours. CBG: No results for input(s): GLUCAP in the last 168 hours. Lipid Profile: No results for input(s): CHOL, HDL, LDLCALC, TRIG, CHOLHDL, LDLDIRECT in the last 72 hours. Thyroid Function Tests: No results for input(s): TSH, T4TOTAL, FREET4, T3FREE, THYROIDAB in the last 72 hours. Anemia Panel: No results for input(s): VITAMINB12, FOLATE, FERRITIN, TIBC, IRON, RETICCTPCT in the last 72 hours. Sepsis Labs: No results for input(s): PROCALCITON, LATICACIDVEN in the last 168 hours.  Recent Results (from the past 240 hour(s))  Urine culture     Status: Abnormal (Preliminary result)   Collection Time: 09/10/19 10:57 PM   Specimen: Urine, Clean Catch  Result Value Ref Range Status   Specimen Description URINE, CLEAN CATCH  Final   Special Requests   Final    NONE Performed at [facility], [address] [place], [place] [address]    Culture (A)  Final    20,000 COLONIES/mL ENTEROCOCCUS FAECALIS SUSCEPTIBILITIES TO FOLLOW    Report Status PENDING  Incomplete  SARS CORONAVIRUS 2 (TAT 6-24 HRS) Nasopharyngeal Nasopharyngeal Swab     Status: None   Collection Time: 09/11/19  1:18 AM   Specimen: Nasopharyngeal Swab  Result Value Ref Range Status   SARS Coronavirus 2 NEGATIVE NEGATIVE Final    Comment: (NOTE) SARS-CoV-2 target nucleic acids are NOT DETECTED. The SARS-CoV-2 RNA is generally detectable in upper and lower respiratory specimens during the acute phase of infection. Negative results do not preclude SARS-CoV-2 infection, do not rule out co-infections with other pathogens, and should not be used as the sole basis for treatment or other patient management decisions. Negative results must be combined with clinical observations, patient history, and epidemiological information. The expected result is Negative. Fact Sheet for Patients: SugarRoll.be Fact Sheet for Healthcare  Providers: https://www.woods-mathews.com/ This test is not yet approved or cleared by the [place] FDA and  has been authorized for detection and/or diagnosis of SARS-CoV-2 by FDA under an Emergency Use Authorization (EUA). This EUA will remain  in effect (meaning this test can be used) for the duration of the COVID-19 declaration under Section 56 4(b)(1) of the Act, 21 U.S.C. section 360bbb-3(b)(1), unless the authorization is terminated or revoked sooner. Performed at [facility], [place] [address], [place], [place] [address]       Radiology Studies: DG Chest Portable 1 View  Result Date: 09/10/2019 CLINICAL DATA:  Weakness. EXAM: PORTABLE CHEST 1 VIEW COMPARISON:  04/01/2019 FINDINGS: There is an old right clavicle fracture. The heart size is stable. Aortic calcifications are noted. There is mild elevation of the left hemidiaphragm  with a possible trace left-sided effusion. There is atelectasis at the lung bases. There is no acute osseous abnormality. There is no pneumothorax. IMPRESSION: Mild elevation of the left hemidiaphragm with possible trace left-sided effusion. Bibasilar atelectasis. Electronically Signed   By: Constance Holster M.D.   On: 09/10/2019 21:31     Scheduled Meds: . vitamin C  500 mg Oral Daily  . aspirin  81 mg Oral Daily  . calcium citrate  1 tablet Oral Daily  . Carbidopa-Levodopa ER  1 capsule Oral 5 X Daily  . enoxaparin (LOVENOX) injection  40 mg Subcutaneous Q24H  . finasteride  5 mg Oral Daily  . fluticasone  2 spray Each Nare Daily  . fluticasone furoate-vilanterol  1 puff Inhalation q morning - 10a  . ipratropium  1 spray Each Nare BID  . irbesartan  37.5 mg Oral Daily  . loratadine  10 mg Oral Daily  . niacin  500 mg Oral BH-q7a  . pantoprazole  40 mg Oral Daily  . simvastatin  10 mg Oral q1800  . tamsulosin  0.4 mg Oral Daily  . umeclidinium bromide  1 puff Inhalation q morning - 10a   Continuous Infusions: . sodium  chloride 75 mL/hr at 09/12/19 1122     LOS: 1 day     Enzo Bi, MD [facility] If 7PM-7AM, please contact night-coverage 09/12/2019, 8:04 PM

## 2019-09-12 NOTE — TOC Initial Note (Signed)
Transition of Care [facility]) - Initial/Assessment Note    Patient Details  Name: Adam Moss MRN: [id number] Date of Birth: [birth year]  Transition [facility]) CM/SW Contact:    Su Hilt, RN Phone Number: 09/12/2019, 1:39 PM  Clinical Narrative:         Met with the patient to discuss DC plan and needs He stated that he plans to go to [facility] at [place] completed PASSR completed, Information sent to [facility] via the Hub          Expected Discharge Plan: [place] Barriers to Discharge: Barriers Resolved   Patient Goals and CMS Choice Patient states their goals for this hospitalization and ongoing recovery are:: go to rehab      Expected Discharge Plan and Services Expected Discharge Plan: [place]       Living arrangements for the past 2 months: Apartment                                      Prior Living Arrangements/Services Living arrangements for the past 2 months: Apartment Lives with:: Self Patient language and need for interpreter reviewed:: Yes Do you feel safe going back to the place where you live?: Yes      Need for Family Participation in Patient Care: No (Comment) Care giver support system in place?: Yes (comment)   Criminal Activity/Legal Involvement Pertinent to Current Situation/Hospitalization: No - Comment as needed  Activities of Daily Living Home Assistive Devices/Equipment: Eyeglasses, Environmental consultant (specify type), Dentures (specify type), Hearing aid ADL Screening (condition at time of admission) Patient's cognitive ability adequate to safely complete daily activities?: Yes Is the patient deaf or have difficulty hearing?: No Does the patient have difficulty seeing, even when wearing glasses/contacts?: No Does the patient have difficulty concentrating, remembering, or making decisions?: No Patient able to express need for assistance with ADLs?: Yes Does the patient have difficulty dressing or  bathing?: Yes Independently performs ADLs?: Yes (appropriate for developmental age) Does the patient have difficulty walking or climbing stairs?: Yes Weakness of Legs: Both Weakness of Arms/Hands: None  Permission Sought/Granted   Permission granted to share information with : Yes, Verbal Permission Granted              Emotional Assessment Appearance:: Appears stated age Attitude/Demeanor/Rapport: Engaged Affect (typically observed): Appropriate Orientation: : Oriented to Self, Oriented to Place, Oriented to  Time, Oriented to Situation Alcohol / Substance Use: Not Applicable    Admission diagnosis:  Hyponatremia [E87.1] UTI (urinary tract infection) [N39.0] Dehydration, mild [E86.0] Generalized weakness [R53.1] Intractable nausea and vomiting [R11.2] Immunosuppressed due to chemotherapy [R32.023, Z79.899] Patient Active Problem List   Diagnosis Date Noted  . UTI (urinary tract infection) 09/11/2019  . Pain due to onychomycosis of toenails of both feet 08/09/2019  . Pincer nail deformity 08/09/2019  . Hypertrophy of prostate with urinary obstruction and other lower urinary tract symptoms (LUTS) 08/02/2019  . Healthcare maintenance 07/06/2019  . Mixed hyperlipidemia 04/24/2019  . Chronic obstructive pulmonary disease ([place]) 04/24/2019  . Multiple myeloma ([place]) 04/24/2019  . Other lymphedema 04/24/2019  . Edema 04/24/2019  . Essential hypertension 04/24/2019  . Lower extremity edema 04/02/2019  . Pathologic fracture 03/30/2019  . Cough 12/04/2015  . Non-seasonal allergic rhinitis 12/04/2015  . Parkinson disease ([place]) 02/21/2015  . Spinal stenosis in cervical region 11/30/2013  . Tremor 11/30/2013  . Patient risk and  functional assessment 04/14/2012  . History of actinic keratoses 10/02/2010  . History of nonmelanoma skin cancer 10/02/2010  . Chest pain, unspecified 03/28/2007   PCP:  Perrin Maltese, MD Pharmacy[facility]   [id number] [id number]: [phone]: [phone] [facility] [place] [facility] to Registered [place] [address]Phone: [phone]: [address]Phone: [phone]: [phone]    Social Determinants of Health (SDOH) Interventions    Readmission Risk Interventions No flowsheet data found.

## 2019-09-12 NOTE — Progress Notes (Signed)
Pt with cardiac monitoring and cont SPO2 orders, pt not appropriate for cardiac monitoring, VSS SPO2 maintained on room air, request to d/c; covering Rufina Falco, NP in agreement, orders received to d/c. Tele aware.

## 2019-09-12 NOTE — Progress Notes (Signed)
Physical Therapy Evaluation Patient Details Name: Adam Moss MRN: [id number] DOB: 07/17/1934 Today's Date: 09/12/2019   History of Present Illness  Per MD note:Adam Moss  is a 86 y.o. male with a known history of COPD, hypertension, dyslipidemia and Parkinson's disease, as well as multiple myeloma on Zometa and Revlimid, who presented to the emergency room with acute onset of leg swelling as well as constipation.  He stated that he felt dehydrated and fatigued.  He was not able to ambulate secondarily at the [facility] at [place] where he resides  Clinical Impression  Patient agrees to PT evaluation. He has -3/5 strength BLE hip flex and 3/5 B knee extension. He needs min assist for supine to sit bed mobility and mod assist for sit to supine bed mobility. He needs min asssit for transfers sit to stand with RW with initial static standing balance impaired with posterior lean and then able to self correct with RW. Patient ambulates 20 feet with RW and min assist with IV pole. Patient will continue to benefit from skilled PT to improve mobility and strength.     Follow Up Recommendations SNF    Equipment Recommendations  None recommended by PT    Recommendations for Other Services       Precautions / Restrictions Precautions Precautions: None Restrictions Weight Bearing Restrictions: No      Mobility  Bed Mobility Overal bed mobility: Needs Assistance Bed Mobility: Supine to Sit;Sit to Supine     Supine to sit: Min assist Sit to supine: Mod assist   General bed mobility comments: needs extra time, elevated hosp bed head, and VC for sequencing  Transfers Overall transfer level: Needs assistance Equipment used: Rolling walker (2 wheeled) Transfers: Sit to/from Stand Sit to Stand: Min assist         General transfer comment: initial static stand is unsteady and improves with practice  Ambulation/Gait Ambulation/Gait assistance: Min assist Gait Distance (Feet):  20 Feet Assistive device: Rolling walker (2 wheeled) Gait Pattern/deviations: Step-to pattern        Stairs            Wheelchair Mobility    Modified Rankin (Stroke Patients Only)       Balance Overall balance assessment: Needs assistance Sitting-balance support: Bilateral upper extremity supported Sitting balance-Leahy Scale: Fair     Standing balance support: Bilateral upper extremity supported Standing balance-Leahy Scale: Fair                               Pertinent Vitals/Pain Pain Assessment: Faces Faces Pain Scale: Hurts little more Pain Location: right knee Pain Intervention(s): Limited activity within patient's tolerance;Monitored during session    Home Living Family/patient expects to be discharged to:: Skilled nursing facility                      Prior Function Level of Independence: Needs assistance   Gait / Transfers Assistance Needed: rollator with MI     Comments: had an aide 2 x week for shower     Hand Dominance        Extremity/Trunk Assessment   Upper Extremity Assessment Upper Extremity Assessment: Overall WFL for tasks assessed    Lower Extremity Assessment Lower Extremity Assessment: Generalized weakness(-3/5 B hips and 3/5 B knee ext)       Communication   Communication: No difficulties  Cognition Arousal/Alertness: Awake/alert Behavior During Therapy: WFL for tasks assessed/performed Overall Cognitive  Status: Within Functional Limits for tasks assessed                                        General Comments      Exercises     Assessment/Plan    PT Assessment Patient needs continued PT services  PT Problem List Decreased strength;Decreased activity tolerance;Decreased mobility       PT Treatment Interventions Gait training;Therapeutic activities;Therapeutic exercise    PT Goals (Current goals can be found in the Care Plan section)  Acute Rehab PT Goals Patient Stated  Goal: to walk better PT Goal Formulation: Patient unable to participate in goal setting Time For Goal Achievement: 09/26/19 Potential to Achieve Goals: Good    Frequency Min 2X/week   Barriers to discharge        Co-evaluation               AM-PAC PT "6 Clicks" Mobility  Outcome Measure Help needed turning from your back to your side while in a flat bed without using bedrails?: A Lot Help needed moving from lying on your back to sitting on the side of a flat bed without using bedrails?: A Lot Help needed moving to and from a bed to a chair (including a wheelchair)?: A Little Help needed standing up from a chair using your arms (e.g., wheelchair or bedside chair)?: A Little Help needed to walk in hospital room?: A Little Help needed climbing 3-5 steps with a railing? : A Lot 6 Click Score: 15    End of Session Equipment Utilized During Treatment: Gait belt Activity Tolerance: Patient tolerated treatment well Patient left: in bed;with bed alarm set Nurse Communication: Mobility status PT Visit Diagnosis: Unsteadiness on feet (R26.81);Muscle weakness (generalized) (M62.81);Difficulty in walking, not elsewhere classified (R26.2)    Time: 1600-1640 PT Time Calculation (min) (ACUTE ONLY): 40 min   Charges:   PT Evaluation $PT Eval Low Complexity: 1 Low PT Treatments $Gait Training: 8-22 mins $Therapeutic Activity: 8-22 mins          Alanson Puls, PT DPT 09/12/2019, 4:51 PM

## 2019-09-12 NOTE — Progress Notes (Signed)
Per patient daughter " Micheline Chapman " may have information about his care

## 2019-09-13 ENCOUNTER — Other Ambulatory Visit: Payer: Self-pay | Admitting: *Deleted

## 2019-09-13 DIAGNOSIS — C9000 Multiple myeloma not having achieved remission: Secondary | ICD-10-CM

## 2019-09-13 LAB — IRON AND TIBC
Iron: 22 ug/dL — ABNORMAL LOW (ref 45–182)
Saturation Ratios: 16 % — ABNORMAL LOW (ref 17.9–39.5)
TIBC: 140 ug/dL — ABNORMAL LOW (ref 250–450)
UIBC: 118 ug/dL

## 2019-09-13 LAB — CBC
HCT: 27.1 % — ABNORMAL LOW (ref 39.0–52.0)
Hemoglobin: 8.8 g/dL — ABNORMAL LOW (ref 13.0–17.0)
MCH: 28.6 pg (ref 26.0–34.0)
MCHC: 32.5 g/dL (ref 30.0–36.0)
MCV: 88 fL (ref 80.0–100.0)
Platelets: 141 10*3/uL — ABNORMAL LOW (ref 150–400)
RBC: 3.08 MIL/uL — ABNORMAL LOW (ref 4.22–5.81)
RDW: 18.3 % — ABNORMAL HIGH (ref 11.5–15.5)
WBC: 2.9 10*3/uL — ABNORMAL LOW (ref 4.0–10.5)
nRBC: 0 % (ref 0.0–0.2)

## 2019-09-13 LAB — BASIC METABOLIC PANEL
Anion gap: 4 — ABNORMAL LOW (ref 5–15)
BUN: 14 mg/dL (ref 8–23)
CO2: 24 mmol/L (ref 22–32)
Calcium: 7.1 mg/dL — ABNORMAL LOW (ref 8.9–10.3)
Chloride: 107 mmol/L (ref 98–111)
Creatinine, Ser: 0.79 mg/dL (ref 0.61–1.24)
GFR calc Af Amer: >60 mL/min (ref >60)
GFR calc non Af Amer: >60 mL/min (ref >60)
Glucose, Bld: 102 mg/dL — ABNORMAL HIGH (ref 70–99)
Potassium: 3.6 mmol/L (ref 3.5–5.1)
Sodium: 135 mmol/L (ref 135–145)

## 2019-09-13 LAB — FOLATE: Folate: 7.2 ng/mL (ref >5.9)

## 2019-09-13 LAB — URINE CULTURE: Culture: 20000 — AB

## 2019-09-13 LAB — VITAMIN B12: Vitamin B-12: 405 pg/mL (ref 180–914)

## 2019-09-13 LAB — MAGNESIUM: Magnesium: 1.8 mg/dL (ref 1.7–2.4)

## 2019-09-13 MED ORDER — IRON 28 MG PO TABS: 1.0000 | ORAL_TABLET | Freq: Every day | ORAL | Status: DC

## 2019-09-13 MED ORDER — SODIUM CHLORIDE 0.9 % IV SOLN
200.0000 mg | Freq: Once | INTRAVENOUS | Status: DC
  Filled 2019-09-13: qty 10

## 2019-09-13 MED ORDER — MELOXICAM 15 MG PO TABS: 15.0000 mg | ORAL_TABLET | Freq: Every day | ORAL | Status: DC | PRN

## 2019-09-13 MED ORDER — LENALIDOMIDE 10 MG PO CAPS: 10.0000 mg | ORAL_CAPSULE | Freq: Every day | ORAL | 0 refills | Status: DC

## 2019-09-13 MED ORDER — OLMESARTAN MEDOXOMIL 20 MG PO TABS: ORAL_TABLET | ORAL | Status: DC

## 2019-09-13 NOTE — Discharge Summary (Signed)
Physician Discharge Summary   Adam Moss  male DOB: 1934/07/11  YHC:[id number]  PCP: Perrin Maltese, MD  Admit date: 09/10/2019 Discharge date:   Admitted From: independent living in [place] at [place] Disposition:  SNF in [place] at [place]: DNR  Discharge Instructions    Diet - low sodium heart healthy   Complete by: As directed    Low sodium   Increase activity slowly   Complete by: As directed        Hospital Course:  For full details, please see H&P, progress notes, consult notes and ancillary notes.  Briefly,  RichardFeldmannis a89 y.o.Caucasian malewith a known history of COPD not on O2, hypertension, dyslipidemia and Parkinson's disease,as well as multiple myeloma on Zometa and Revlimid, whopresented from independent living in [place] at [place] to the emergency room with worsening leg swelling as well as constipation. He stated that he felt dehydrated and fatigued. He was not able to ambulate 2/2 pain at the balls of his feet and weakness.    # Mild Cr increase, likely prerenal from volume depletion and dehydration, AKI ruled out Cr baseline 0.8-1.  Pt's Cr peaked at 1.22, but improved with IVF hydration.  Prior to discharge, Cr 0.79.  # Hyponatremia. This likely hypovolemic. Na improved with IVF hydration.  Prior to discharge, Na 135.  # BLE swelling 2/2 venous stasis Pt reported using lymphedema-ware at home which helps.  Refused ACE wrap and requested TEDs.  There were some mild discoloration at the bottom 1/3 of the lower legs on both sides, likely just venous stasis changes, do not think it's cellulitis.  No abx given.  UTI, ruled out UA neg for infection.  Some mild burning with initiation of urine stream likely due to BPH and concentrated urine.    Parkinson's disease. Continued his home sinemet CR  BPH.  Continued his Proscar and Flomax.  Hypertension.  While on BENICAR during hospitalization, pt's BP  was on the low normal side.  BENICAR is therefore held until PCP followup.  Low dose home Lasix 20 mg daily resumed to help with leg swelling.  Dyslipidemia.  Continued statin therapy.  # Debility  # Pain at the balls of his feet Pain at the balls of his feet likely due to pressure applied on them.  No pain while in bed.  PT recommended SNF rehab.   Discharge Diagnoses:  Active Problems:   UTI (urinary tract infection)    Discharge Instructions:  Allergies as of 09/13/2019      Reactions   Carbidopa-levodopa Other (See Comments)   Severe stomach pains, can take rytary   Oxycodone-acetaminophen Other (See Comments)   "boils on skin"   Tyloxapol    Acetaminophen Rash, Nausea And Vomiting, Hives   Pramipexole Nausea Only      Medication List    STOP taking these medications   doxycycline 100 MG tablet Commonly known as: VIBRA-TABS   ProAir HFA 108 (90 Base) MCG/ACT inhaler Generic drug: albuterol     TAKE these medications   aspirin 81 MG tablet Take 81 mg by mouth daily.   Breo Ellipta 100-25 MCG/INH Aepb Generic drug: fluticasone furoate-vilanterol USE 1 INHALATION ORALLY    DAILY   Calcium Citrate 250 MG Tabs Take 1 tablet by mouth daily.   cetirizine 10 MG tablet Commonly known as: ZYRTEC Take 10 mg by mouth daily.   cyanocobalamin 1000 MCG/ML injection Commonly known as: (VITAMIN B-12)   finasteride 5 MG tablet Commonly known as:  PROSCAR Take 5 mg by mouth daily.   fluticasone 50 MCG/ACT nasal spray Commonly known as: FLONASE Place into both nostrils daily.   furosemide 20 MG tablet Commonly known as: LASIX Take 20 mg by mouth daily.   GAS RELIEF PO Take 1 Dose by mouth as needed.   ipratropium 0.03 % nasal spray Commonly known as: ATROVENT Place 0.03 mLs into both nostrils 3 (three) times daily as needed.   Iron 28 MG Tabs Take 1 tablet (28 mg total) by mouth daily. Or can take any other iron supplement.   lenalidomide 10 MG  capsule Commonly known as: REVLIMID Take 1 capsule (10 mg total) by mouth daily.   meloxicam 15 MG tablet Commonly known as: MOBIC Take 1 tablet (15 mg total) by mouth daily as needed for pain. What changed:   when to take this  reasons to take this   methocarbamol 500 MG tablet Commonly known as: ROBAXIN methocarbamol 500 mg tablet  TAKE 1 TABLET BY MOUTH EVERY DAY   niacin 500 MG tablet Take 500 mg by mouth every morning.   olmesartan 20 MG tablet Commonly known as: BENICAR Hold until PCP followup because blood pressure on the low side in the hospital. What changed:   how much to take  how to take this  when to take this  additional instructions   omeprazole 40 MG capsule Commonly known as: PRILOSEC Take 40 mg by mouth daily.   Oyster Shell Calcium/D 250-125 MG-UNIT Tabs Take by mouth.   Rytary 48.75-195 MG Cpcr Generic drug: Carbidopa-Levodopa ER Take 195 mg by mouth 5 (five) times daily.   simvastatin 10 MG tablet Commonly known as: ZOCOR Take 10 mg by mouth daily.   tamsulosin 0.4 MG Caps capsule Commonly known as: FLOMAX Take 0.4 mg by mouth daily.   Trelegy Ellipta 100-62.5-25 MCG/INH Aepb Generic drug: Fluticasone-Umeclidin-Vilant Trelegy Ellipta 100-62.5-25 MCG/INH Inhalation Aerosol Powder Breath Activated QTY: 3 inhaler Days: 90 Refills: 3  Written: 08/02/19 Patient Instructions: inhale 1 puff by mouth once daily. Rinse mouth well after use.   vitamin C 500 MG tablet Commonly known as: ASCORBIC ACID Take 500 mg by mouth daily.       Follow-up Information    Perrin Maltese, MD. Schedule an appointment as soon as possible for a visit on 09/17/2019.   Specialty: Internal Medicine Why: For ER visit follow-up and reevaluation. AT [address] Contact information: [place] [address] [phone]        Lloyd Huger, MD. Schedule an appointment as soon as possible for a visit on 09/20/2019.   Specialty: Oncology Why: For  ER visit follow-up and reevaluation.AT [address] Contact information: [address] [place] [address] [phone]           Allergies  Allergen Reactions  . Carbidopa-Levodopa Other (See Comments)    Severe stomach pains, can take rytary  . Oxycodone-Acetaminophen Other (See Comments)    "boils on skin"  . Tyloxapol   . Acetaminophen Rash, Nausea And Vomiting and Hives  . Pramipexole Nausea Only     The results of significant diagnostics from this hospitalization (including imaging, microbiology, ancillary and laboratory) are listed below for reference.   Consultations:   Procedures/Studies: DG Chest Portable 1 View  Result Date: 09/10/2019 CLINICAL DATA:  Weakness. EXAM: PORTABLE CHEST 1 VIEW COMPARISON:  04/01/2019 FINDINGS: There is an old right clavicle fracture. The heart size is stable. Aortic calcifications are noted. There is mild elevation of the  left hemidiaphragm with a possible trace left-sided effusion. There is atelectasis at the lung bases. There is no acute osseous abnormality. There is no pneumothorax. IMPRESSION: Mild elevation of the left hemidiaphragm with possible trace left-sided effusion. Bibasilar atelectasis. Electronically Signed   By: Constance Holster M.D.   On: 09/10/2019 21:31      Labs: BNP (last 3 results) Recent Labs    04/01/19 1625  BNP 97.6   Basic Metabolic Panel: Recent Labs  Lab 09/10/19 1341 09/11/19 0535 09/12/19 0537 09/13/19 0439  NA 127* 131* 134* 135  K 3.6 3.3* 3.4* 3.6  CL 95* 100 105 107  CO2 22 23 21* 24  GLUCOSE 124* 92 108* 102*  BUN 17 26* 21 14  CREATININE 1.00 1.22 0.99 0.79  CALCIUM 7.5* 7.1* 7.2* 7.1*  MG  --   --  1.9 1.8   Liver Function Tests: Recent Labs  Lab 09/10/19 1341  AST 25  ALT 8  ALKPHOS 84  BILITOT 1.2  PROT 6.7  ALBUMIN 3.2*   Recent Labs  Lab 09/10/19 1341  LIPASE 19   No results for input(s): AMMONIA in the last 168 hours. CBC: Recent Labs  Lab 09/10/19 1341  09/11/19 0535 09/12/19 0537 09/13/19 0439  WBC 5.1 4.4 3.1* 2.9*  NEUTROABS 3.6  --   --   --   HGB 10.3* 8.8* 8.9* 8.8*  HCT 32.9* 28.1* 27.9* 27.1*  MCV 88.2 89.8 88.0 88.0  PLT 207 143* 157 141*   Cardiac Enzymes: No results for input(s): CKTOTAL, CKMB, CKMBINDEX, TROPONINI in the last 168 hours. BNP: Invalid input(s): POCBNP CBG: No results for input(s): GLUCAP in the last 168 hours. D-Dimer No results for input(s): DDIMER in the last 72 hours. Hgb A1c No results for input(s): HGBA1C in the last 72 hours. Lipid Profile No results for input(s): CHOL, HDL, LDLCALC, TRIG, CHOLHDL, LDLDIRECT in the last 72 hours. Thyroid function studies No results for input(s): TSH, T4TOTAL, T3FREE, THYROIDAB in the last 72 hours.  Invalid input(s): FREET3 Anemia work up Recent Labs    09/13/19 0439  FOLATE 7.2  TIBC 140*  IRON 22*   Urinalysis    Component Value Date/Time   COLORURINE AMBER (A) 09/10/2019 2257   APPEARANCEUR CLOUDY (A) 09/10/2019 2257   LABSPEC 1.021 09/10/2019 2257   PHURINE 5.0 09/10/2019 2257   GLUCOSEU NEGATIVE 09/10/2019 2257   HGBUR MODERATE (A) 09/10/2019 2257   BILIRUBINUR NEGATIVE 09/10/2019 2257   KETONESUR 5 (A) 09/10/2019 2257   PROTEINUR 100 (A) 09/10/2019 2257   NITRITE NEGATIVE 09/10/2019 2257   LEUKOCYTESUR NEGATIVE 09/10/2019 2257   Sepsis Labs Invalid input(s): PROCALCITONIN,  WBC,  LACTICIDVEN Microbiology Recent Results (from the past 240 hour(s))  Urine culture     Status: Abnormal   Collection Time: 09/10/19 10:57 PM   Specimen: Urine, Clean Catch  Result Value Ref Range Status   Specimen Description   Final    URINE, CLEAN CATCH Performed at [facility], [address], [place], [place] [address]    Special Requests   Final    NONE Performed at [facility], [address] [place], [place] [address]    Culture 20,000 COLONIES/mL ENTEROCOCCUS FAECALIS (A)  Final   Report Status 09/13/2019 FINAL  Final    Organism ID, Bacteria ENTEROCOCCUS FAECALIS (A)  Final      Susceptibility   Enterococcus faecalis - MIC*    AMPICILLIN <=2 SENSITIVE Sensitive     NITROFURANTOIN <=16 SENSITIVE Sensitive  VANCOMYCIN 1 SENSITIVE Sensitive     * 20,000 COLONIES/mL ENTEROCOCCUS FAECALIS  SARS CORONAVIRUS 2 (TAT 6-24 HRS) Nasopharyngeal Nasopharyngeal Swab     Status: None   Collection Time: 09/11/19  1:18 AM   Specimen: Nasopharyngeal Swab  Result Value Ref Range Status   SARS Coronavirus 2 NEGATIVE NEGATIVE Final    Comment: (NOTE) SARS-CoV-2 target nucleic acids are NOT DETECTED. The SARS-CoV-2 RNA is generally detectable in upper and lower respiratory specimens during the acute phase of infection. Negative results do not preclude SARS-CoV-2 infection, do not rule out co-infections with other pathogens, and should not be used as the sole basis for treatment or other patient management decisions. Negative results must be combined with clinical observations, patient history, and epidemiological information. The expected result is Negative. Fact Sheet for Patients: SugarRoll.be Fact Sheet for Healthcare Providers: https://www.woods-mathews.com/ This test is not yet approved or cleared by the [place] FDA and  has been authorized for detection and/or diagnosis of SARS-CoV-2 by FDA under an Emergency Use Authorization (EUA). This EUA will remain  in effect (meaning this test can be used) for the duration of the COVID-19 declaration under Section 56 4(b)(1) of the Act, 21 U.S.C. section 360bbb-3(b)(1), unless the authorization is terminated or revoked sooner. Performed at [facility], [place] [address] [place], [place] [address]      Total time spend on discharging this patient, including the last patient exam, discussing the hospital stay, instructions for ongoing care as it relates to all pertinent caregivers, as well as preparing the medical  discharge records, prescriptions, and/or referrals as applicable, is 30 minutes.    Enzo Bi, MD  [facility] 09/13/2019, 11:37 AM  If 7PM-7AM, please contact night-coverage

## 2019-09-13 NOTE — TOC Transition Note (Signed)
Transition of Care [facility]) - CM/SW Discharge Note   Patient Details  Name: Adam Moss MRN: [id number] Date of Birth: [birth year]  [facility]) CM/SW Contact:  Su Hilt, RN Phone Number: 09/13/2019, 10:49 AM   Clinical Narrative:   Patient to transport to [place] today via EMS transport, He will go to room 334, Bedside Nurse to call report to [phone] The patient's Son [name] Made aware    Final next level of care: Skilled Nursing Facility Barriers to Discharge: Barriers Resolved   Patient Goals and CMS Choice Patient states their goals for this hospitalization and ongoing recovery are:: go to rehab      Discharge Placement              Patient [facility] bed at: [facility] Patient to be transferred to facility by: EMS Name of family member notified: [name] SOn Patient and family notified of of transfer: 09/13/19  Discharge Plan and Services                                     Social Determinants of Health (SDOH) Interventions     Readmission Risk Interventions No flowsheet data found.

## 2019-09-13 NOTE — Progress Notes (Signed)
Attempt to call [place] at [employer] , x 1 unsuccessful

## 2019-09-13 NOTE — Progress Notes (Deleted)
[place]  [phone] [phone]  ID: Mauri Pole OB: 02-22-1934  MR#: [id number]  IWP#:[id number]  Patient Care Team: Perrin Maltese, MD as PCP - General (Internal Medicine)   CHIEF COMPLAINT: Kappa light chain myeloma.   INTERVAL HISTORY: Patient returns to clinic today for repeat laboratory work, further evaluation, and continuation of Zometa.  He continues to tolerate Revlimid well without significant side effects.  He had separate episodes of foot pain lasting approximately 24 hours and then resolving without intervention.  This has not recurred.  He otherwise has felt well.  He has a fair appetite, and admits to decreased fluid intake.  He has a resting tremor secondary to his Parkinson's, but no other neurologic complaints.  He denies any recent fevers or illnesses.  He has no chest pain, shortness of breath, cough, or hemoptysis.  He denies any nausea, vomiting, constipation, or diarrhea.  He has no urinary complaints.  Patient offers no further specific complaints today.  REVIEW OF SYSTEMS:   Review of Systems  Constitutional: Positive for malaise/fatigue. Negative for fever and weight loss.  Respiratory: Negative.  Negative for cough, hemoptysis and shortness of breath.   Cardiovascular: Negative.  Negative for chest pain and leg swelling.  Gastrointestinal: Negative.  Negative for abdominal pain.  Genitourinary: Negative.  Negative for dysuria.  Musculoskeletal: Negative.  Negative for back pain.  Skin: Negative.  Negative for rash.  Neurological: Positive for tremors and weakness. Negative for dizziness, focal weakness and headaches.  Psychiatric/Behavioral: Negative.  The patient is not nervous/anxious.     As per HPI. Otherwise, a complete review of systems is negative.  PAST MEDICAL HISTORY: Past Medical History:  Diagnosis Date  . Cancer ([place])    skin  . COPD (chronic obstructive pulmonary disease) ([place])   .  Hyperlipemia   . Hypertension   . Lymphedema of both lower extremities   . Parkinson disease ([place])   . Parkinsonism ([place]) 02/21/2015  . Spinal stenosis   . Tremor     PAST SURGICAL HISTORY: Past Surgical History:  Procedure Laterality Date  . APPENDECTOMY    . CATARACT EXTRACTION    . CHOLECYSTECTOMY    . TONSILLECTOMY    . VASECTOMY      FAMILY HISTORY: Family History  Problem Relation Age of Onset  . Cancer Mother   . Heart disease Father     ADVANCED DIRECTIVES (Y/N):  N  HEALTH MAINTENANCE: Social History   Tobacco Use  . Smoking status: Never Smoker  . Smokeless tobacco: Never Used  Substance Use Topics  . Alcohol use: No    Alcohol/week: 0.0 standard drinks  . Drug use: No     Colonoscopy:  PAP:  Bone density:  Lipid panel:  Allergies  Allergen Reactions  . Carbidopa-Levodopa Other (See Comments)    Severe stomach pains, can take rytary  . Oxycodone-Acetaminophen Other (See Comments)    "boils on skin"  . Tyloxapol   . Acetaminophen Rash, Nausea And Vomiting and Hives  . Pramipexole Nausea Only    Current Outpatient Medications  Medication Sig Dispense Refill  . aspirin 81 MG tablet Take 81 mg by mouth daily.    . Calcium Carbonate-Vitamin D (OYSTER SHELL CALCIUM/D) 250-125 MG-UNIT TABS Take by mouth.    . Calcium Citrate 250 MG TABS Take 1 tablet by mouth daily.     . Carbidopa-Levodopa ER (RYTARY) 48.75-195 MG CPCR Take 195 mg by mouth 5 (five) times daily. 450 capsule 3  .  cetirizine (ZYRTEC) 10 MG tablet Take 10 mg by mouth daily.    . cyanocobalamin (,VITAMIN B-12,) 1000 MCG/ML injection     . Ferrous Sulfate (IRON) 28 MG TABS Take 1 tablet (28 mg total) by mouth daily. Or can take any other iron supplement.    . finasteride (PROSCAR) 5 MG tablet Take 5 mg by mouth daily.    . fluticasone (FLONASE) 50 MCG/ACT nasal spray Place into both nostrils daily.    . fluticasone furoate-vilanterol (BREO ELLIPTA) 100-25 MCG/INH AEPB USE 1 INHALATION  ORALLY    DAILY 180 each 3  . Fluticasone-Umeclidin-Vilant (TRELEGY ELLIPTA) 100-62.5-25 MCG/INH AEPB Trelegy Ellipta 100-62.5-25 MCG/INH Inhalation Aerosol Powder Breath Activated QTY: 3 inhaler Days: 90 Refills: 3  Written: 08/02/19 Patient Instructions: inhale 1 puff by mouth once daily. Rinse mouth well after use.    . furosemide (LASIX) 20 MG tablet Take 20 mg by mouth daily.     Marland Kitchen ipratropium (ATROVENT) 0.03 % nasal spray Place 0.03 mLs into both nostrils 3 (three) times daily as needed.  11  . lenalidomide (REVLIMID) 10 MG capsule Take 1 capsule (10 mg total) by mouth daily. 28 capsule 0  . meloxicam (MOBIC) 15 MG tablet Take 1 tablet (15 mg total) by mouth daily as needed for pain.    . methocarbamol (ROBAXIN) 500 MG tablet methocarbamol 500 mg tablet  TAKE 1 TABLET BY MOUTH EVERY DAY    . niacin 500 MG tablet Take 500 mg by mouth every morning.     . olmesartan (BENICAR) 20 MG tablet Hold until PCP followup because blood pressure on the low side in the hospital.    . omeprazole (PRILOSEC) 40 MG capsule Take 40 mg by mouth daily.    . Simethicone (GAS RELIEF PO) Take 1 Dose by mouth as needed.     . simvastatin (ZOCOR) 10 MG tablet Take 10 mg by mouth daily.    . tamsulosin (FLOMAX) 0.4 MG CAPS capsule Take 0.4 mg by mouth daily.     . vitamin C (ASCORBIC ACID) 500 MG tablet Take 500 mg by mouth daily.     No current facility-administered medications for this visit.   Facility-Administered Medications Ordered in Other Visits  Medication Dose Route Frequency Provider Last Rate Last Admin  . ascorbic acid (VITAMIN C) tablet 500 mg  500 mg Oral Daily Mansy, Jan A, MD   500 mg at 09/13/19 1028  . aspirin chewable tablet 81 mg  81 mg Oral Daily Mansy, Jan A, MD   81 mg at 09/13/19 1028  . calcium citrate (CALCITRATE - dosed in mg elemental calcium) tablet 200 mg of elemental calcium  1 tablet Oral Daily Mansy, Jan A, MD   200 mg of elemental calcium at 09/13/19 1032  . Carbidopa-Levodopa ER  48.75-195 MG CPCR 1 capsule  1 capsule Oral 5 X Daily Mansy, Jan A, MD   1 capsule at 09/13/19 1031  . enoxaparin (LOVENOX) injection 40 mg  40 mg Subcutaneous Q24H Mansy, Jan A, MD   40 mg at 09/13/19 1033  . finasteride (PROSCAR) tablet 5 mg  5 mg Oral Daily Mansy, Jan A, MD   5 mg at 09/13/19 1027  . fluticasone (FLONASE) 50 MCG/ACT nasal spray 2 spray  2 spray Each Nare Daily Mansy, Jan A, MD   2 spray at 09/13/19 1035  . fluticasone furoate-vilanterol (BREO ELLIPTA) 100-25 MCG/INH 1 puff  1 puff Inhalation q morning - 10a Mansy, Arvella Merles, MD   1  puff at 09/13/19 1035  . ipratropium (ATROVENT) 0.03 % nasal spray 1 spray  1 spray Each Nare BID Mansy, Jan A, MD   1 spray at 09/13/19 1035  . irbesartan (AVAPRO) tablet 37.5 mg  37.5 mg Oral Daily Mansy, Jan A, MD   37.5 mg at 09/13/19 1030  . iron sucrose (VENOFER) 200 mg in sodium chloride 0.9 % 100 mL IVPB  200 mg Intravenous Once Enzo Bi, MD      . loratadine (CLARITIN) tablet 10 mg  10 mg Oral Daily Mansy, Jan A, MD   10 mg at 09/13/19 1029  . magnesium hydroxide (MILK OF MAGNESIA) suspension 30 mL  30 mL Oral Daily PRN Mansy, Jan A, MD      . methocarbamol (ROBAXIN) tablet 500 mg  500 mg Oral Q8H PRN Mansy, Jan A, MD      . niacin tablet 500 mg  500 mg Oral BH-q7a Mansy, Jan A, MD   500 mg at 09/13/19 1029  . ondansetron (ZOFRAN) tablet 4 mg  4 mg Oral Q6H PRN Mansy, Jan A, MD       Or  . ondansetron [facility]) injection 4 mg  4 mg Intravenous Q6H PRN Mansy, Jan A, MD      . pantoprazole (PROTONIX) EC tablet 40 mg  40 mg Oral Daily Mansy, Jan A, MD   40 mg at 09/13/19 1028  . simethicone (MYLICON) chewable tablet 80 mg  80 mg Oral QID PRN Mansy, Jan A, MD      . simvastatin (ZOCOR) tablet 10 mg  10 mg Oral q1800 Mansy, Jan A, MD   10 mg at 09/12/19 1732  . tamsulosin (FLOMAX) capsule 0.4 mg  0.4 mg Oral Daily Mansy, Jan A, MD   0.4 mg at 09/13/19 1032  . traZODone (DESYREL) tablet 25 mg  25 mg Oral QHS PRN Mansy, Jan A, MD      . umeclidinium  bromide (INCRUSE ELLIPTA) 62.5 MCG/INH 1 puff  1 puff Inhalation q morning - 10a Enzo Bi, MD   1 puff at 09/13/19 1036    OBJECTIVE: There were no vitals filed for this visit.   There is no height or weight on file to calculate BMI.    ECOG FS:1 - Symptomatic but completely ambulatory  General: Well-developed, well-nourished, no acute distress.  Sitting in a wheelchair. Eyes: Pink conjunctiva, anicteric sclera. HEENT: Normocephalic, moist mucous membranes. Lungs: No audible wheezing or coughing. Heart: Regular rate and rhythm. Abdomen: Soft, nontender, no obvious distention. Musculoskeletal: No edema, cyanosis, or clubbing. Neuro: Alert, answering all questions appropriately. Cranial nerves grossly intact. Skin: No rashes or petechiae noted. Psych: Normal affect.  LAB RESULTS:  Lab Results  Component Value Date   NA 135 09/13/2019   K 3.6 09/13/2019   CL 107 09/13/2019   CO2 24 09/13/2019   GLUCOSE 102 (H) 09/13/2019   BUN 14 09/13/2019   CREATININE 0.79 09/13/2019   CALCIUM 7.1 (L) 09/13/2019   PROT 6.7 09/10/2019   ALBUMIN 3.2 (L) 09/10/2019   AST 25 09/10/2019   ALT 8 09/10/2019   ALKPHOS 84 09/10/2019   BILITOT 1.2 09/10/2019   GFRNONAA >60 09/13/2019   GFRAA >60 09/13/2019    Lab Results  Component Value Date   WBC 2.9 (L) 09/13/2019   NEUTROABS 3.6 09/10/2019   HGB 8.8 (L) 09/13/2019   HCT 27.1 (L) 09/13/2019   MCV 88.0 09/13/2019   PLT 141 (L) 09/13/2019     STUDIES: DG Chest  Portable 1 View  Result Date: 09/10/2019 CLINICAL DATA:  Weakness. EXAM: PORTABLE CHEST 1 VIEW COMPARISON:  04/01/2019 FINDINGS: There is an old right clavicle fracture. The heart size is stable. Aortic calcifications are noted. There is mild elevation of the left hemidiaphragm with a possible trace left-sided effusion. There is atelectasis at the lung bases. There is no acute osseous abnormality. There is no pneumothorax. IMPRESSION: Mild elevation of the left hemidiaphragm with  possible trace left-sided effusion. Bibasilar atelectasis. Electronically Signed   By: Constance Holster M.D.   On: 09/10/2019 21:31    ASSESSMENT: Kappa light chain myeloma.  PLAN:    1. Kappa light chain myeloma: Bone marrow biopsy on May 01, 2019 revealed increased plasma cells of 30 to 40% with kappa light chain restriction.  SPEP is negative and IgG, IgA, and IgM are essentially within normal limits.  Patient's kappa free light chain as well as her kappa/lambda light chain ratio have trended down to 488 and 31.9 respectively.  His hemoglobin has trended down slightly to 10.0.  He has no other evidence of endorgan damage other than his right clavicle pathologic fracture. Although nuclear med bone scan was unrevealing, CT of the abdomen pelvis did reveal multiple osseous lytic lesions highly suspicious for underlying multiple myeloma.  Patient wishes to pursue treatment, although given his underlying Parkinson's and advanced age does not wish to pursue fully aggressive chemotherapy.  Patient initiated low-dose 10 mg Revlimid daily at the beginning of November 2020.  Proceed with Zometa today.  Return to clinic in 4 weeks with repeat laboratory work, further evaluation, and continuation of treatment. 2.  Right clavicle fracture: Pathologic.  Resolved.  Patient reports no further interventions are needed by orthopedics. 3.  Hypocalcemia: Chronic and unchanged.  Okay to proceed with Zometa as above. 4.  Anemia: Hemoglobin has trended down slightly to 10.0.  Monitor. 5.  Thrombocytopenia: Resolved. 6.  Hypotension: Slightly improved, monitor.  Patient expressed understanding and was in agreement with this plan. He also understands that He can call clinic at any time with any questions, concerns, or complaints.    Lloyd Huger, MD   09/13/2019 2:24 PM

## 2019-09-13 NOTE — Progress Notes (Signed)
Patient discharged [facility] with personal belongings to [facility] via stretcher. IV removed from [facility]

## 2019-09-14 DIAGNOSIS — C9000 Multiple myeloma not having achieved remission: Secondary | ICD-10-CM | POA: Diagnosis not present

## 2019-09-14 DIAGNOSIS — G20 Parkinson's disease: Secondary | ICD-10-CM | POA: Diagnosis not present

## 2019-09-14 DIAGNOSIS — R279 Unspecified lack of coordination: Secondary | ICD-10-CM | POA: Diagnosis not present

## 2019-09-14 DIAGNOSIS — N400 Enlarged prostate without lower urinary tract symptoms: Secondary | ICD-10-CM | POA: Diagnosis not present

## 2019-09-14 DIAGNOSIS — R601 Generalized edema: Secondary | ICD-10-CM | POA: Diagnosis not present

## 2019-09-14 DIAGNOSIS — Z87440 Personal history of urinary (tract) infections: Secondary | ICD-10-CM | POA: Diagnosis not present

## 2019-09-14 DIAGNOSIS — J410 Simple chronic bronchitis: Secondary | ICD-10-CM | POA: Diagnosis not present

## 2019-09-14 DIAGNOSIS — M6281 Muscle weakness (generalized): Secondary | ICD-10-CM | POA: Diagnosis not present

## 2019-09-14 DIAGNOSIS — I872 Venous insufficiency (chronic) (peripheral): Secondary | ICD-10-CM | POA: Diagnosis not present

## 2019-09-14 DIAGNOSIS — R2689 Other abnormalities of gait and mobility: Secondary | ICD-10-CM | POA: Diagnosis not present

## 2019-09-14 DIAGNOSIS — E785 Hyperlipidemia, unspecified: Secondary | ICD-10-CM | POA: Diagnosis not present

## 2019-09-15 DIAGNOSIS — M6281 Muscle weakness (generalized): Secondary | ICD-10-CM | POA: Diagnosis not present

## 2019-09-15 DIAGNOSIS — R2689 Other abnormalities of gait and mobility: Secondary | ICD-10-CM | POA: Diagnosis not present

## 2019-09-15 DIAGNOSIS — R279 Unspecified lack of coordination: Secondary | ICD-10-CM | POA: Diagnosis not present

## 2019-09-15 DIAGNOSIS — R601 Generalized edema: Secondary | ICD-10-CM | POA: Diagnosis not present

## 2019-09-15 DIAGNOSIS — G20 Parkinson's disease: Secondary | ICD-10-CM | POA: Diagnosis not present

## 2019-09-15 DIAGNOSIS — C9000 Multiple myeloma not having achieved remission: Secondary | ICD-10-CM | POA: Diagnosis not present

## 2019-09-17 DIAGNOSIS — R601 Generalized edema: Secondary | ICD-10-CM | POA: Insufficient documentation

## 2019-09-17 DIAGNOSIS — C9000 Multiple myeloma not having achieved remission: Secondary | ICD-10-CM | POA: Diagnosis not present

## 2019-09-17 DIAGNOSIS — G20 Parkinson's disease: Secondary | ICD-10-CM | POA: Diagnosis not present

## 2019-09-17 DIAGNOSIS — R279 Unspecified lack of coordination: Secondary | ICD-10-CM | POA: Diagnosis not present

## 2019-09-17 DIAGNOSIS — I872 Venous insufficiency (chronic) (peripheral): Secondary | ICD-10-CM | POA: Insufficient documentation

## 2019-09-17 DIAGNOSIS — R2689 Other abnormalities of gait and mobility: Secondary | ICD-10-CM | POA: Diagnosis not present

## 2019-09-17 DIAGNOSIS — M6281 Muscle weakness (generalized): Secondary | ICD-10-CM | POA: Diagnosis not present

## 2019-09-17 DIAGNOSIS — J42 Unspecified chronic bronchitis: Secondary | ICD-10-CM | POA: Insufficient documentation

## 2019-09-17 DIAGNOSIS — J410 Simple chronic bronchitis: Secondary | ICD-10-CM | POA: Diagnosis not present

## 2019-09-18 ENCOUNTER — Inpatient Hospital Stay: Payer: Medicare Other | Admitting: Oncology

## 2019-09-18 DIAGNOSIS — G20 Parkinson's disease: Secondary | ICD-10-CM | POA: Diagnosis not present

## 2019-09-18 DIAGNOSIS — R601 Generalized edema: Secondary | ICD-10-CM | POA: Diagnosis not present

## 2019-09-18 DIAGNOSIS — M6281 Muscle weakness (generalized): Secondary | ICD-10-CM | POA: Diagnosis not present

## 2019-09-18 DIAGNOSIS — R279 Unspecified lack of coordination: Secondary | ICD-10-CM | POA: Diagnosis not present

## 2019-09-18 DIAGNOSIS — R2689 Other abnormalities of gait and mobility: Secondary | ICD-10-CM | POA: Diagnosis not present

## 2019-09-18 DIAGNOSIS — C9000 Multiple myeloma not having achieved remission: Secondary | ICD-10-CM | POA: Diagnosis not present

## 2019-09-19 ENCOUNTER — Ambulatory Visit: Payer: Self-pay | Admitting: Urology

## 2019-09-19 DIAGNOSIS — C9000 Multiple myeloma not having achieved remission: Secondary | ICD-10-CM | POA: Diagnosis not present

## 2019-09-19 DIAGNOSIS — R279 Unspecified lack of coordination: Secondary | ICD-10-CM | POA: Diagnosis not present

## 2019-09-19 DIAGNOSIS — M6281 Muscle weakness (generalized): Secondary | ICD-10-CM | POA: Diagnosis not present

## 2019-09-19 DIAGNOSIS — R2689 Other abnormalities of gait and mobility: Secondary | ICD-10-CM | POA: Diagnosis not present

## 2019-09-19 DIAGNOSIS — R601 Generalized edema: Secondary | ICD-10-CM | POA: Diagnosis not present

## 2019-09-19 DIAGNOSIS — G20 Parkinson's disease: Secondary | ICD-10-CM | POA: Diagnosis not present

## 2019-09-20 ENCOUNTER — Other Ambulatory Visit: Payer: Self-pay

## 2019-09-20 DIAGNOSIS — R279 Unspecified lack of coordination: Secondary | ICD-10-CM | POA: Diagnosis not present

## 2019-09-20 DIAGNOSIS — M6281 Muscle weakness (generalized): Secondary | ICD-10-CM | POA: Diagnosis not present

## 2019-09-20 DIAGNOSIS — C9000 Multiple myeloma not having achieved remission: Secondary | ICD-10-CM | POA: Diagnosis not present

## 2019-09-20 DIAGNOSIS — R2689 Other abnormalities of gait and mobility: Secondary | ICD-10-CM | POA: Diagnosis not present

## 2019-09-20 DIAGNOSIS — G20 Parkinson's disease: Secondary | ICD-10-CM | POA: Diagnosis not present

## 2019-09-20 DIAGNOSIS — R601 Generalized edema: Secondary | ICD-10-CM | POA: Diagnosis not present

## 2019-09-20 NOTE — Progress Notes (Signed)
Patient's daughter in law gave information for pre screening as patient is in assisted living.

## 2019-09-21 ENCOUNTER — Inpatient Hospital Stay (HOSPITAL_BASED_OUTPATIENT_CLINIC_OR_DEPARTMENT_OTHER): Payer: Medicare Other | Admitting: Oncology

## 2019-09-21 ENCOUNTER — Other Ambulatory Visit: Payer: Self-pay

## 2019-09-21 VITALS — BP 122/80 | HR 92 | Temp 97.9°F | Resp 18

## 2019-09-21 DIAGNOSIS — R2689 Other abnormalities of gait and mobility: Secondary | ICD-10-CM | POA: Diagnosis not present

## 2019-09-21 DIAGNOSIS — R5383 Other fatigue: Secondary | ICD-10-CM | POA: Diagnosis not present

## 2019-09-21 DIAGNOSIS — D649 Anemia, unspecified: Secondary | ICD-10-CM | POA: Diagnosis not present

## 2019-09-21 DIAGNOSIS — M79673 Pain in unspecified foot: Secondary | ICD-10-CM | POA: Diagnosis not present

## 2019-09-21 DIAGNOSIS — R601 Generalized edema: Secondary | ICD-10-CM | POA: Diagnosis not present

## 2019-09-21 DIAGNOSIS — M6281 Muscle weakness (generalized): Secondary | ICD-10-CM | POA: Diagnosis not present

## 2019-09-21 DIAGNOSIS — J449 Chronic obstructive pulmonary disease, unspecified: Secondary | ICD-10-CM | POA: Diagnosis not present

## 2019-09-21 DIAGNOSIS — Z7951 Long term (current) use of inhaled steroids: Secondary | ICD-10-CM | POA: Diagnosis not present

## 2019-09-21 DIAGNOSIS — Z791 Long term (current) use of non-steroidal anti-inflammatories (NSAID): Secondary | ICD-10-CM | POA: Diagnosis not present

## 2019-09-21 DIAGNOSIS — K219 Gastro-esophageal reflux disease without esophagitis: Secondary | ICD-10-CM | POA: Diagnosis not present

## 2019-09-21 DIAGNOSIS — I959 Hypotension, unspecified: Secondary | ICD-10-CM | POA: Diagnosis not present

## 2019-09-21 DIAGNOSIS — Z79899 Other long term (current) drug therapy: Secondary | ICD-10-CM | POA: Diagnosis not present

## 2019-09-21 DIAGNOSIS — Z7982 Long term (current) use of aspirin: Secondary | ICD-10-CM | POA: Diagnosis not present

## 2019-09-21 DIAGNOSIS — C9000 Multiple myeloma not having achieved remission: Secondary | ICD-10-CM | POA: Diagnosis not present

## 2019-09-21 DIAGNOSIS — E785 Hyperlipidemia, unspecified: Secondary | ICD-10-CM | POA: Diagnosis not present

## 2019-09-21 DIAGNOSIS — R279 Unspecified lack of coordination: Secondary | ICD-10-CM | POA: Diagnosis not present

## 2019-09-21 DIAGNOSIS — G20 Parkinson's disease: Secondary | ICD-10-CM | POA: Diagnosis not present

## 2019-09-21 DIAGNOSIS — Z8249 Family history of ischemic heart disease and other diseases of the circulatory system: Secondary | ICD-10-CM | POA: Diagnosis not present

## 2019-09-22 NOTE — Progress Notes (Signed)
[place]  Telephone:(336) [phone] [phone]) [phone]  ID: Mauri Pole OB: December 27, 1933  MR#: [id number]  BLT#:[id number]  Patient Care Team: Perrin Maltese, MD as PCP - General (Internal Medicine)   CHIEF COMPLAINT: Kappa light chain myeloma.   INTERVAL HISTORY: Patient returns to clinic today for hospital follow-up.  He continues to have peripheral edema and increased anasarca.  He also has increased weakness and fatigue, but admits this is improving. He has a resting tremor secondary to his Parkinson's, but no other neurologic complaints.  He denies any recent fevers or illnesses.  He has no chest pain, shortness of breath, cough, or hemoptysis.  He denies any nausea, vomiting, constipation, or diarrhea.  He has no urinary complaints.  Patient offers no further specific complaints today.  REVIEW OF SYSTEMS:   Review of Systems  Constitutional: Positive for malaise/fatigue. Negative for fever and weight loss.  Respiratory: Negative.  Negative for cough, hemoptysis and shortness of breath.   Cardiovascular: Negative.  Negative for chest pain and leg swelling.  Gastrointestinal: Negative.  Negative for abdominal pain.  Genitourinary: Negative.  Negative for dysuria.  Musculoskeletal: Negative.  Negative for back pain.  Skin: Negative.  Negative for rash.  Neurological: Positive for tremors and weakness. Negative for dizziness, focal weakness and headaches.  Psychiatric/Behavioral: Negative.  The patient is not nervous/anxious.     As per HPI. Otherwise, a complete review of systems is negative.  PAST MEDICAL HISTORY: Past Medical History:  Diagnosis Date  . Cancer ([place])    skin  . COPD (chronic obstructive pulmonary disease) ([place])   . Hyperlipemia   . Hypertension   . Lymphedema of both lower extremities   . Parkinson disease ([place])   . Parkinsonism ([place]) 02/21/2015  . Spinal stenosis   . Tremor     PAST SURGICAL HISTORY: Past Surgical History:    Procedure Laterality Date  . APPENDECTOMY    . CATARACT EXTRACTION    . CHOLECYSTECTOMY    . TONSILLECTOMY    . VASECTOMY      FAMILY HISTORY: Family History  Problem Relation Age of Onset  . Cancer Mother   . Heart disease Father     ADVANCED DIRECTIVES (Y/N):  N  HEALTH MAINTENANCE: Social History   Tobacco Use  . Smoking status: Never Smoker  . Smokeless tobacco: Never Used  Substance Use Topics  . Alcohol use: No    Alcohol/week: 0.0 standard drinks  . Drug use: No     Colonoscopy:  PAP:  Bone density:  Lipid panel:  Allergies  Allergen Reactions  . Carbidopa-Levodopa Other (See Comments)    Severe stomach pains, can take rytary  . Oxycodone-Acetaminophen Other (See Comments)    "boils on skin"  . Tyloxapol   . Acetaminophen Rash, Nausea And Vomiting and Hives  . Pramipexole Nausea Only    Current Outpatient Medications  Medication Sig Dispense Refill  . aspirin 81 MG tablet Take 81 mg by mouth daily.    . Calcium Carbonate-Vitamin D (OYSTER SHELL CALCIUM/D) 250-125 MG-UNIT TABS Take by mouth.    . Calcium Citrate 250 MG TABS Take 1 tablet by mouth daily.     . Carbidopa-Levodopa ER (RYTARY) 48.75-195 MG CPCR Take 195 mg by mouth 5 (five) times daily. 450 capsule 3  . cetirizine (ZYRTEC) 10 MG tablet Take 10 mg by mouth daily.    . cyanocobalamin (,VITAMIN B-12,) 1000 MCG/ML injection     . Ferrous Sulfate (IRON) 28 MG TABS Take 1  tablet (28 mg total) by mouth daily. Or can take any other iron supplement.    . finasteride (PROSCAR) 5 MG tablet Take 5 mg by mouth daily.    . fluticasone (FLONASE) 50 MCG/ACT nasal spray Place into both nostrils daily.    . fluticasone furoate-vilanterol (BREO ELLIPTA) 100-25 MCG/INH AEPB USE 1 INHALATION ORALLY    DAILY 180 each 3  . Fluticasone-Umeclidin-Vilant (TRELEGY ELLIPTA) 100-62.5-25 MCG/INH AEPB Trelegy Ellipta 100-62.5-25 MCG/INH Inhalation Aerosol Powder Breath Activated QTY: 3 inhaler Days: 90 Refills: 3   Written: 08/02/19 Patient Instructions: inhale 1 puff by mouth once daily. Rinse mouth well after use.    . furosemide (LASIX) 20 MG tablet Take 20 mg by mouth daily.     Marland Kitchen ipratropium (ATROVENT) 0.03 % nasal spray Place 0.03 mLs into both nostrils 3 (three) times daily as needed.  11  . lenalidomide (REVLIMID) 10 MG capsule Take 1 capsule (10 mg total) by mouth daily. 28 capsule 0  . meloxicam (MOBIC) 15 MG tablet Take 1 tablet (15 mg total) by mouth daily as needed for pain.    . methocarbamol (ROBAXIN) 500 MG tablet methocarbamol 500 mg tablet  TAKE 1 TABLET BY MOUTH EVERY DAY    . niacin 500 MG tablet Take 500 mg by mouth every morning.     . olmesartan (BENICAR) 20 MG tablet Hold until PCP followup because blood pressure on the low side in the hospital.    . omeprazole (PRILOSEC) 40 MG capsule Take 40 mg by mouth daily.    . Simethicone (GAS RELIEF PO) Take 1 Dose by mouth as needed.     . simvastatin (ZOCOR) 10 MG tablet Take 10 mg by mouth daily.    . tamsulosin (FLOMAX) 0.4 MG CAPS capsule Take 0.4 mg by mouth daily.     . vitamin C (ASCORBIC ACID) 500 MG tablet Take 500 mg by mouth daily.     No current facility-administered medications for this visit.    OBJECTIVE: Vitals:   09/21/19 1409  BP: 122/80  Pulse: 92  Resp: 18  Temp: 97.9 F (36.6 C)  SpO2: 100%     There is no height or weight on file to calculate BMI.    ECOG FS:1 - Symptomatic but completely ambulatory  General: Well-developed, well-nourished, no acute distress.  Sitting in a wheelchair. Eyes: Pink conjunctiva, anicteric sclera. HEENT: Normocephalic, moist mucous membranes. Lungs: No audible wheezing or coughing. Heart: Regular rate and rhythm. Abdomen: Soft, nontender, no obvious distention. Musculoskeletal: 1-2+ bilateral peripheral edema. Neuro: Alert, answering all questions appropriately. Cranial nerves grossly intact. Skin: No rashes or petechiae noted. Psych: Normal affect.  LAB RESULTS:  Lab  Results  Component Value Date   NA 135 09/13/2019   K 3.6 09/13/2019   CL 107 09/13/2019   CO2 24 09/13/2019   GLUCOSE 102 (H) 09/13/2019   BUN 14 09/13/2019   CREATININE 0.79 09/13/2019   CALCIUM 7.1 (L) 09/13/2019   PROT 6.7 09/10/2019   ALBUMIN 3.2 (L) 09/10/2019   AST 25 09/10/2019   ALT 8 09/10/2019   ALKPHOS 84 09/10/2019   BILITOT 1.2 09/10/2019   GFRNONAA >60 09/13/2019   GFRAA >60 09/13/2019    Lab Results  Component Value Date   WBC 2.9 (L) 09/13/2019   NEUTROABS 3.6 09/10/2019   HGB 8.8 (L) 09/13/2019   HCT 27.1 (L) 09/13/2019   MCV 88.0 09/13/2019   PLT 141 (L) 09/13/2019     STUDIES: DG Chest Portable 1 View  Result Date: 09/10/2019 CLINICAL DATA:  Weakness. EXAM: PORTABLE CHEST 1 VIEW COMPARISON:  04/01/2019 FINDINGS: There is an old right clavicle fracture. The heart size is stable. Aortic calcifications are noted. There is mild elevation of the left hemidiaphragm with a possible trace left-sided effusion. There is atelectasis at the lung bases. There is no acute osseous abnormality. There is no pneumothorax. IMPRESSION: Mild elevation of the left hemidiaphragm with possible trace left-sided effusion. Bibasilar atelectasis. Electronically Signed   By: Constance Holster M.D.   On: 09/10/2019 21:31    ASSESSMENT: Kappa light chain myeloma.  PLAN:    1. Kappa light chain myeloma: Bone marrow biopsy on May 01, 2019 revealed increased plasma cells of 30 to 40% with kappa light chain restriction.  SPEP is negative and IgG, IgA, and IgM are essentially within normal limits.  Patient's kappa free light chain as well as her kappa/lambda light chain ratio trended up slightly to 586.1 and 31.74 respectively.  His most recent hemoglobin continues to trend down and is now 8.8.  He also has a mild leukopenia and thrombocytopenia.  He has no other evidence of endorgan damage other than his right clavicle pathologic fracture. Although nuclear med bone scan was unrevealing,  CT of the abdomen pelvis did reveal multiple osseous lytic lesions highly suspicious for underlying multiple myeloma.  Patient wishes to pursue treatment, although given his underlying Parkinson's and advanced age does not wish to pursue fully aggressive chemotherapy.  Patient initiated low-dose 10 mg Revlimid daily at the beginning of November 2020.  Patient held Revlimid while in the hospital, but now has reinitiated treatment.  Return to clinic as previously scheduled for continuation of Zometa. 2.  Right clavicle fracture: Pathologic.  Resolved.  Patient reports no further interventions are needed by orthopedics. 3.  Hypocalcemia: Chronic and unchanged.  4.  Anemia: Patient's hemoglobin has trended down to 8.8.  He is also noted to have decreased iron stores in the hospital.  He will receive 510 mg IV Feraheme when he returns to clinic in March to receive his Zometa. 5.  Thrombocytopenia: Mild.  Patient's platelet count has dropped to 141.  Monitor. 6.  Leukopenia: Mild, monitor. 7.  Hypotension: Resolved.   Patient expressed understanding and was in agreement with this plan. He also understands that He can call clinic at any time with any questions, concerns, or complaints.    Lloyd Huger, MD   09/22/2019 7:53 AM

## 2019-09-24 ENCOUNTER — Other Ambulatory Visit: Payer: Self-pay | Admitting: *Deleted

## 2019-09-24 DIAGNOSIS — G20 Parkinson's disease: Secondary | ICD-10-CM | POA: Diagnosis not present

## 2019-09-24 DIAGNOSIS — R279 Unspecified lack of coordination: Secondary | ICD-10-CM | POA: Diagnosis not present

## 2019-09-24 DIAGNOSIS — J410 Simple chronic bronchitis: Secondary | ICD-10-CM | POA: Diagnosis not present

## 2019-09-24 DIAGNOSIS — I872 Venous insufficiency (chronic) (peripheral): Secondary | ICD-10-CM | POA: Diagnosis not present

## 2019-09-24 DIAGNOSIS — Z87440 Personal history of urinary (tract) infections: Secondary | ICD-10-CM | POA: Diagnosis not present

## 2019-09-24 DIAGNOSIS — N400 Enlarged prostate without lower urinary tract symptoms: Secondary | ICD-10-CM | POA: Diagnosis not present

## 2019-09-24 DIAGNOSIS — C9000 Multiple myeloma not having achieved remission: Secondary | ICD-10-CM

## 2019-09-24 DIAGNOSIS — M6281 Muscle weakness (generalized): Secondary | ICD-10-CM | POA: Diagnosis not present

## 2019-09-24 DIAGNOSIS — R601 Generalized edema: Secondary | ICD-10-CM | POA: Diagnosis not present

## 2019-09-24 DIAGNOSIS — R2689 Other abnormalities of gait and mobility: Secondary | ICD-10-CM | POA: Diagnosis not present

## 2019-09-24 DIAGNOSIS — E785 Hyperlipidemia, unspecified: Secondary | ICD-10-CM | POA: Diagnosis not present

## 2019-09-25 DIAGNOSIS — G20 Parkinson's disease: Secondary | ICD-10-CM | POA: Diagnosis not present

## 2019-09-25 DIAGNOSIS — C9000 Multiple myeloma not having achieved remission: Secondary | ICD-10-CM | POA: Diagnosis not present

## 2019-09-25 DIAGNOSIS — R279 Unspecified lack of coordination: Secondary | ICD-10-CM | POA: Diagnosis not present

## 2019-09-25 DIAGNOSIS — M6281 Muscle weakness (generalized): Secondary | ICD-10-CM | POA: Diagnosis not present

## 2019-09-25 DIAGNOSIS — R2689 Other abnormalities of gait and mobility: Secondary | ICD-10-CM | POA: Diagnosis not present

## 2019-09-25 DIAGNOSIS — R601 Generalized edema: Secondary | ICD-10-CM | POA: Diagnosis not present

## 2019-09-26 DIAGNOSIS — C9000 Multiple myeloma not having achieved remission: Secondary | ICD-10-CM | POA: Diagnosis not present

## 2019-09-26 DIAGNOSIS — R279 Unspecified lack of coordination: Secondary | ICD-10-CM | POA: Diagnosis not present

## 2019-09-26 DIAGNOSIS — R601 Generalized edema: Secondary | ICD-10-CM | POA: Diagnosis not present

## 2019-09-26 DIAGNOSIS — R2689 Other abnormalities of gait and mobility: Secondary | ICD-10-CM | POA: Diagnosis not present

## 2019-09-26 DIAGNOSIS — M6281 Muscle weakness (generalized): Secondary | ICD-10-CM | POA: Diagnosis not present

## 2019-09-26 DIAGNOSIS — G20 Parkinson's disease: Secondary | ICD-10-CM | POA: Diagnosis not present

## 2019-09-27 DIAGNOSIS — R279 Unspecified lack of coordination: Secondary | ICD-10-CM | POA: Diagnosis not present

## 2019-09-27 DIAGNOSIS — G20 Parkinson's disease: Secondary | ICD-10-CM | POA: Diagnosis not present

## 2019-09-27 DIAGNOSIS — C9000 Multiple myeloma not having achieved remission: Secondary | ICD-10-CM | POA: Diagnosis not present

## 2019-09-27 DIAGNOSIS — M6281 Muscle weakness (generalized): Secondary | ICD-10-CM | POA: Diagnosis not present

## 2019-09-27 DIAGNOSIS — R601 Generalized edema: Secondary | ICD-10-CM | POA: Diagnosis not present

## 2019-09-27 DIAGNOSIS — R2689 Other abnormalities of gait and mobility: Secondary | ICD-10-CM | POA: Diagnosis not present

## 2019-09-28 DIAGNOSIS — R2689 Other abnormalities of gait and mobility: Secondary | ICD-10-CM | POA: Diagnosis not present

## 2019-09-28 DIAGNOSIS — R279 Unspecified lack of coordination: Secondary | ICD-10-CM | POA: Diagnosis not present

## 2019-09-28 DIAGNOSIS — M6281 Muscle weakness (generalized): Secondary | ICD-10-CM | POA: Diagnosis not present

## 2019-09-28 DIAGNOSIS — G20 Parkinson's disease: Secondary | ICD-10-CM | POA: Diagnosis not present

## 2019-09-28 DIAGNOSIS — R601 Generalized edema: Secondary | ICD-10-CM | POA: Diagnosis not present

## 2019-09-28 DIAGNOSIS — C9000 Multiple myeloma not having achieved remission: Secondary | ICD-10-CM | POA: Diagnosis not present

## 2019-09-28 DIAGNOSIS — I10 Essential (primary) hypertension: Secondary | ICD-10-CM | POA: Diagnosis not present

## 2019-09-28 DIAGNOSIS — R609 Edema, unspecified: Secondary | ICD-10-CM | POA: Diagnosis not present

## 2019-09-28 DIAGNOSIS — J449 Chronic obstructive pulmonary disease, unspecified: Secondary | ICD-10-CM | POA: Diagnosis not present

## 2019-09-28 DIAGNOSIS — E782 Mixed hyperlipidemia: Secondary | ICD-10-CM | POA: Diagnosis not present

## 2019-09-30 NOTE — Progress Notes (Signed)
[place]  [phone] [phone]  ID: Adam Moss OB: March 09, 1934  MR#: [id number]  GBT#:[id number]  Patient Care Team: Perrin Maltese, MD as PCP - General (Internal Medicine)   CHIEF COMPLAINT: Kappa light chain myeloma.   INTERVAL HISTORY: Patient returns to clinic today for repeat laboratory work, further evaluation, and continuation of treatment.  His edema and anasarca have significantly improved.  He continues to have chronic weakness and fatigue.  He has increased constipation. He has a resting tremor secondary to his Parkinson's, but no other neurologic complaints.  He denies any recent fevers or illnesses.  He has no chest pain, shortness of breath, cough, or hemoptysis.  He denies any nausea, vomiting, or diarrhea.  He has no urinary complaints.  Patient offers no further specific complaints today.  REVIEW OF SYSTEMS:   Review of Systems  Constitutional: Positive for malaise/fatigue. Negative for fever and weight loss.  Respiratory: Negative.  Negative for cough, hemoptysis and shortness of breath.   Cardiovascular: Negative.  Negative for chest pain and leg swelling.  Gastrointestinal: Positive for constipation. Negative for abdominal pain.  Genitourinary: Negative.  Negative for dysuria.  Musculoskeletal: Negative.  Negative for back pain.  Skin: Negative.  Negative for rash.  Neurological: Positive for tremors and weakness. Negative for dizziness, focal weakness and headaches.  Psychiatric/Behavioral: Negative.  The patient is not nervous/anxious.     As per HPI. Otherwise, a complete review of systems is negative.  PAST MEDICAL HISTORY: Past Medical History:  Diagnosis Date  . Cancer ([place])    skin  . COPD (chronic obstructive pulmonary disease) ([place])   . Hyperlipemia   . Hypertension   . Lymphedema of both lower extremities   . Parkinson disease ([place])   . Parkinsonism ([place]) 02/21/2015  . Spinal stenosis   . Tremor      PAST SURGICAL HISTORY: Past Surgical History:  Procedure Laterality Date  . APPENDECTOMY    . CATARACT EXTRACTION    . CHOLECYSTECTOMY    . TONSILLECTOMY    . VASECTOMY      FAMILY HISTORY: Family History  Problem Relation Age of Onset  . Cancer Mother   . Heart disease Father     ADVANCED DIRECTIVES (Y/N):  N  HEALTH MAINTENANCE: Social History   Tobacco Use  . Smoking status: Never Smoker  . Smokeless tobacco: Never Used  Substance Use Topics  . Alcohol use: No    Alcohol/week: 0.0 standard drinks  . Drug use: No     Colonoscopy:  PAP:  Bone density:  Lipid panel:  Allergies  Allergen Reactions  . Carbidopa-Levodopa Other (See Comments)    Severe stomach pains, can take rytary  . Oxycodone-Acetaminophen Other (See Comments)    "boils on skin"  . Tyloxapol   . Acetaminophen Rash, Nausea And Vomiting and Hives  . Pramipexole Nausea Only    Current Outpatient Medications  Medication Sig Dispense Refill  . amoxicillin-clavulanate (AUGMENTIN) 875-125 MG tablet Take 1 tablet by mouth 2 (two) times daily.    Marland Kitchen aspirin 81 MG tablet Take 81 mg by mouth daily.    . Calcium Carbonate-Vitamin D (OYSTER SHELL CALCIUM/D) 250-125 MG-UNIT TABS Take by mouth.    . Calcium Citrate 250 MG TABS Take 1 tablet by mouth daily.     . Carbidopa-Levodopa ER (RYTARY) 48.75-195 MG CPCR Take 195 mg by mouth 5 (five) times daily. 450 capsule 3  . cetirizine (ZYRTEC) 10 MG tablet Take 10 mg by mouth daily.    Marland Kitchen  cyanocobalamin (,VITAMIN B-12,) 1000 MCG/ML injection     . Ferrous Sulfate (IRON) 28 MG TABS Take 1 tablet (28 mg total) by mouth daily. Or can take any other iron supplement.    . finasteride (PROSCAR) 5 MG tablet Take 5 mg by mouth daily.    . fluticasone (FLONASE) 50 MCG/ACT nasal spray Place into both nostrils daily.    . fluticasone furoate-vilanterol (BREO ELLIPTA) 100-25 MCG/INH AEPB USE 1 INHALATION ORALLY    DAILY 180 each 3  . Fluticasone-Umeclidin-Vilant  (TRELEGY ELLIPTA) 100-62.5-25 MCG/INH AEPB Trelegy Ellipta 100-62.5-25 MCG/INH Inhalation Aerosol Powder Breath Activated QTY: 3 inhaler Days: 90 Refills: 3  Written: 08/02/19 Patient Instructions: inhale 1 puff by mouth once daily. Rinse mouth well after use.    . furosemide (LASIX) 20 MG tablet Take 20 mg by mouth daily.     Marland Kitchen ipratropium (ATROVENT) 0.03 % nasal spray Place 0.03 mLs into both nostrils 3 (three) times daily as needed.  11  . lenalidomide (REVLIMID) 10 MG capsule Take 1 capsule (10 mg total) by mouth daily. 28 capsule 0  . meloxicam (MOBIC) 15 MG tablet Take 1 tablet (15 mg total) by mouth daily as needed for pain.    . methocarbamol (ROBAXIN) 500 MG tablet methocarbamol 500 mg tablet  TAKE 1 TABLET BY MOUTH EVERY DAY    . niacin 500 MG tablet Take 500 mg by mouth every morning.     . olmesartan (BENICAR) 20 MG tablet Hold until PCP followup because blood pressure on the low side in the hospital.    . omeprazole (PRILOSEC) 40 MG capsule Take 40 mg by mouth daily.    . Simethicone (GAS RELIEF PO) Take 1 Dose by mouth as needed.     . simvastatin (ZOCOR) 10 MG tablet Take 10 mg by mouth daily.    . tamsulosin (FLOMAX) 0.4 MG CAPS capsule Take 0.4 mg by mouth daily.     . vitamin C (ASCORBIC ACID) 500 MG tablet Take 500 mg by mouth daily.     No current facility-administered medications for this visit.    OBJECTIVE: Vitals:   10/01/19 1347  BP: 119/77  Pulse: 85  Resp: 17  Temp: (!) 97 F (36.1 C)  SpO2: 100%     Body mass index is 25.8 kg/m.    ECOG FS:1 - Symptomatic but completely ambulatory  General: Well-developed, well-nourished, no acute distress.  Sitting in wheelchair. Eyes: Pink conjunctiva, anicteric sclera. HEENT: Normocephalic, moist mucous membranes. Lungs: No audible wheezing or coughing. Heart: Regular rate and rhythm. Abdomen: Soft, nontender, no obvious distention. Musculoskeletal: Compression stockings noted, minimal peripheral edema. Neuro:  Alert, answering all questions appropriately. Cranial nerves grossly intact.  Resting tremor. Skin: No rashes or petechiae noted. Psych: Normal affect.   LAB RESULTS:  Lab Results  Component Value Date   NA 132 (L) 10/01/2019   K 3.7 10/01/2019   CL 96 (L) 10/01/2019   CO2 26 10/01/2019   GLUCOSE 104 (H) 10/01/2019   BUN 11 10/01/2019   CREATININE 0.80 10/01/2019   CALCIUM 8.2 (L) 10/01/2019   PROT 6.3 (L) 10/01/2019   ALBUMIN 3.4 (L) 10/01/2019   AST 30 10/01/2019   ALT 8 10/01/2019   ALKPHOS 83 10/01/2019   BILITOT 0.9 10/01/2019   GFRNONAA >60 10/01/2019   GFRAA >60 10/01/2019    Lab Results  Component Value Date   WBC 7.6 10/01/2019   NEUTROABS 4.2 10/01/2019   HGB 10.4 (L) 10/01/2019   HCT 34.4 (L)  10/01/2019   MCV 93.0 10/01/2019   PLT 111 (L) 10/01/2019     STUDIES: DG Chest Portable 1 View  Result Date: 09/10/2019 CLINICAL DATA:  Weakness. EXAM: PORTABLE CHEST 1 VIEW COMPARISON:  04/01/2019 FINDINGS: There is an old right clavicle fracture. The heart size is stable. Aortic calcifications are noted. There is mild elevation of the left hemidiaphragm with a possible trace left-sided effusion. There is atelectasis at the lung bases. There is no acute osseous abnormality. There is no pneumothorax. IMPRESSION: Mild elevation of the left hemidiaphragm with possible trace left-sided effusion. Bibasilar atelectasis. Electronically Signed   By: Constance Holster M.D.   On: 09/10/2019 21:31    ASSESSMENT: Kappa light chain myeloma.  PLAN:    1. Kappa light chain myeloma: Bone marrow biopsy on May 01, 2019 revealed increased plasma cells of 30 to 40% with kappa light chain restriction.  SPEP is negative and IgG, IgA, and IgM are essentially within normal limits.  Patient's kappa free light chain as well as her kappa/lambda light chain ratio trended up slightly to 586.1 and 31.74 respectively.  Today's result is pending.  Hemoglobin has improved to 10.4 with IV iron.   Leukopenia has resolved.  He has no other evidence of endorgan damage other than his right clavicle pathologic fracture. Although nuclear med bone scan was unrevealing, CT of the abdomen pelvis did reveal multiple osseous lytic lesions highly suspicious for underlying multiple myeloma.  Patient wishes to pursue treatment, although given his underlying Parkinson's and advanced age does not wish to pursue fully aggressive chemotherapy.  Patient initiated low-dose 10 mg Revlimid daily at the beginning of November 2020.  Continue treatment as scheduled.  Proceed with IV Zometa today.  Return to clinic in 4 weeks for further evaluation and continuation of treatment. 2.  Right clavicle fracture: Pathologic.  Resolved.  Patient reports no further interventions are needed by orthopedics. 3.  Hypocalcemia: Chronic and unchanged.  Proceed with treatment as above. 4.  Iron deficiency anemia: Hemoglobin improved to 10.4, will hold IV iron for today. 5.  Thrombocytopenia: Platelets are trending down and are now 111.  Monitor. 6.  Leukopenia: Resolved. 7.  Constipation: Patient has been instructed to reinitiate stool softener.  Patient expressed understanding and was in agreement with this plan. He also understands that He can call clinic at any time with any questions, concerns, or complaints.    Lloyd Huger, MD   10/02/2019 6:35 AM

## 2019-10-01 ENCOUNTER — Telehealth: Payer: Self-pay

## 2019-10-01 ENCOUNTER — Inpatient Hospital Stay: Payer: Medicare Other | Attending: Oncology

## 2019-10-01 ENCOUNTER — Inpatient Hospital Stay: Payer: Medicare Other

## 2019-10-01 ENCOUNTER — Encounter: Payer: Self-pay | Admitting: Oncology

## 2019-10-01 ENCOUNTER — Other Ambulatory Visit: Payer: Self-pay

## 2019-10-01 ENCOUNTER — Inpatient Hospital Stay (HOSPITAL_BASED_OUTPATIENT_CLINIC_OR_DEPARTMENT_OTHER): Payer: Medicare Other | Admitting: Oncology

## 2019-10-01 VITALS — BP 119/77 | HR 85 | Temp 97.0°F | Resp 17 | Wt 185.0 lb

## 2019-10-01 DIAGNOSIS — D696 Thrombocytopenia, unspecified: Secondary | ICD-10-CM | POA: Diagnosis not present

## 2019-10-01 DIAGNOSIS — K219 Gastro-esophageal reflux disease without esophagitis: Secondary | ICD-10-CM | POA: Insufficient documentation

## 2019-10-01 DIAGNOSIS — Z7951 Long term (current) use of inhaled steroids: Secondary | ICD-10-CM | POA: Insufficient documentation

## 2019-10-01 DIAGNOSIS — C9000 Multiple myeloma not having achieved remission: Secondary | ICD-10-CM

## 2019-10-01 DIAGNOSIS — Z8249 Family history of ischemic heart disease and other diseases of the circulatory system: Secondary | ICD-10-CM | POA: Diagnosis not present

## 2019-10-01 DIAGNOSIS — E785 Hyperlipidemia, unspecified: Secondary | ICD-10-CM | POA: Insufficient documentation

## 2019-10-01 DIAGNOSIS — I10 Essential (primary) hypertension: Secondary | ICD-10-CM | POA: Diagnosis not present

## 2019-10-01 DIAGNOSIS — D509 Iron deficiency anemia, unspecified: Secondary | ICD-10-CM | POA: Diagnosis not present

## 2019-10-01 DIAGNOSIS — K5900 Constipation, unspecified: Secondary | ICD-10-CM | POA: Diagnosis not present

## 2019-10-01 DIAGNOSIS — G20 Parkinson's disease: Secondary | ICD-10-CM | POA: Diagnosis not present

## 2019-10-01 DIAGNOSIS — R531 Weakness: Secondary | ICD-10-CM | POA: Insufficient documentation

## 2019-10-01 DIAGNOSIS — M6281 Muscle weakness (generalized): Secondary | ICD-10-CM | POA: Diagnosis not present

## 2019-10-01 DIAGNOSIS — Z791 Long term (current) use of non-steroidal anti-inflammatories (NSAID): Secondary | ICD-10-CM | POA: Diagnosis not present

## 2019-10-01 DIAGNOSIS — Z79899 Other long term (current) drug therapy: Secondary | ICD-10-CM | POA: Diagnosis not present

## 2019-10-01 DIAGNOSIS — J449 Chronic obstructive pulmonary disease, unspecified: Secondary | ICD-10-CM | POA: Diagnosis not present

## 2019-10-01 DIAGNOSIS — R601 Generalized edema: Secondary | ICD-10-CM | POA: Insufficient documentation

## 2019-10-01 DIAGNOSIS — Z7982 Long term (current) use of aspirin: Secondary | ICD-10-CM | POA: Insufficient documentation

## 2019-10-01 DIAGNOSIS — R279 Unspecified lack of coordination: Secondary | ICD-10-CM | POA: Diagnosis not present

## 2019-10-01 DIAGNOSIS — R2689 Other abnormalities of gait and mobility: Secondary | ICD-10-CM | POA: Diagnosis not present

## 2019-10-01 LAB — CBC WITH DIFFERENTIAL/PLATELET
Abs Immature Granulocytes: 0.03 10*3/uL (ref 0.00–0.07)
Basophils Absolute: 0 10*3/uL (ref 0.0–0.1)
Basophils Relative: 0 %
Eosinophils Absolute: 0.2 10*3/uL (ref 0.0–0.5)
Eosinophils Relative: 3 %
HCT: 34.4 % — ABNORMAL LOW (ref 39.0–52.0)
Hemoglobin: 10.4 g/dL — ABNORMAL LOW (ref 13.0–17.0)
Immature Granulocytes: 0 %
Lymphocytes Relative: 34 %
Lymphs Abs: 2.6 10*3/uL (ref 0.7–4.0)
MCH: 28.1 pg (ref 26.0–34.0)
MCHC: 30.2 g/dL (ref 30.0–36.0)
MCV: 93 fL (ref 80.0–100.0)
Monocytes Absolute: 0.5 10*3/uL (ref 0.1–1.0)
Monocytes Relative: 7 %
Neutro Abs: 4.2 10*3/uL (ref 1.7–7.7)
Neutrophils Relative %: 56 %
Platelets: 111 10*3/uL — ABNORMAL LOW (ref 150–400)
RBC: 3.7 MIL/uL — ABNORMAL LOW (ref 4.22–5.81)
RDW: 19.2 % — ABNORMAL HIGH (ref 11.5–15.5)
WBC: 7.6 10*3/uL (ref 4.0–10.5)
nRBC: 0 % (ref 0.0–0.2)

## 2019-10-01 LAB — COMPREHENSIVE METABOLIC PANEL
ALT: 8 U/L (ref 0–44)
AST: 30 U/L (ref 15–41)
Albumin: 3.4 g/dL — ABNORMAL LOW (ref 3.5–5.0)
Alkaline Phosphatase: 83 U/L (ref 38–126)
Anion gap: 10 (ref 5–15)
BUN: 11 mg/dL (ref 8–23)
CO2: 26 mmol/L (ref 22–32)
Calcium: 8.2 mg/dL — ABNORMAL LOW (ref 8.9–10.3)
Chloride: 96 mmol/L — ABNORMAL LOW (ref 98–111)
Creatinine, Ser: 0.8 mg/dL (ref 0.61–1.24)
GFR calc Af Amer: >60 mL/min (ref >60)
GFR calc non Af Amer: >60 mL/min (ref >60)
Glucose, Bld: 104 mg/dL — ABNORMAL HIGH (ref 70–99)
Potassium: 3.7 mmol/L (ref 3.5–5.1)
Sodium: 132 mmol/L — ABNORMAL LOW (ref 135–145)
Total Bilirubin: 0.9 mg/dL (ref 0.3–1.2)
Total Protein: 6.3 g/dL — ABNORMAL LOW (ref 6.5–8.1)

## 2019-10-01 MED ORDER — SODIUM CHLORIDE 0.9 % IV SOLN
Freq: Once | INTRAVENOUS | Status: AC
  Filled 2019-10-01: qty 250

## 2019-10-01 MED ORDER — ZOLEDRONIC ACID 4 MG/100ML IV SOLN
4.0000 mg | Freq: Once | INTRAVENOUS | Status: AC
  Administered 2019-10-01: 15:00:00 4 mg via INTRAVENOUS
  Filled 2019-10-01: qty 100

## 2019-10-01 NOTE — Progress Notes (Signed)
Pt here for follow up. No complaints or concerns.  

## 2019-10-02 DIAGNOSIS — M6281 Muscle weakness (generalized): Secondary | ICD-10-CM | POA: Diagnosis not present

## 2019-10-02 DIAGNOSIS — R601 Generalized edema: Secondary | ICD-10-CM | POA: Diagnosis not present

## 2019-10-02 DIAGNOSIS — C9000 Multiple myeloma not having achieved remission: Secondary | ICD-10-CM | POA: Diagnosis not present

## 2019-10-02 DIAGNOSIS — R2689 Other abnormalities of gait and mobility: Secondary | ICD-10-CM | POA: Diagnosis not present

## 2019-10-02 DIAGNOSIS — G20 Parkinson's disease: Secondary | ICD-10-CM | POA: Diagnosis not present

## 2019-10-02 DIAGNOSIS — R279 Unspecified lack of coordination: Secondary | ICD-10-CM | POA: Diagnosis not present

## 2019-10-02 LAB — IGG, IGA, IGM
IgA: 158 mg/dL (ref 61–437)
IgG (Immunoglobin G), Serum: 801 mg/dL (ref 603–1613)
IgM (Immunoglobulin M), Srm: 42 mg/dL (ref 15–143)

## 2019-10-02 LAB — KAPPA/LAMBDA LIGHT CHAINS
Kappa free light chain: 757.6 mg/L — ABNORMAL HIGH (ref 3.3–19.4)
Kappa, lambda light chain ratio: 39.66 — ABNORMAL HIGH (ref 0.26–1.65)
Lambda free light chains: 19.1 mg/L (ref 5.7–26.3)

## 2019-10-03 DIAGNOSIS — C9000 Multiple myeloma not having achieved remission: Secondary | ICD-10-CM | POA: Diagnosis not present

## 2019-10-03 DIAGNOSIS — G20 Parkinson's disease: Secondary | ICD-10-CM | POA: Diagnosis not present

## 2019-10-03 DIAGNOSIS — R2689 Other abnormalities of gait and mobility: Secondary | ICD-10-CM | POA: Diagnosis not present

## 2019-10-03 DIAGNOSIS — M6281 Muscle weakness (generalized): Secondary | ICD-10-CM | POA: Diagnosis not present

## 2019-10-03 DIAGNOSIS — R601 Generalized edema: Secondary | ICD-10-CM | POA: Diagnosis not present

## 2019-10-03 DIAGNOSIS — R279 Unspecified lack of coordination: Secondary | ICD-10-CM | POA: Diagnosis not present

## 2019-10-03 NOTE — Telephone Encounter (Signed)
SW attempted to contact patient. SW left VM with contact information. 

## 2019-10-04 DIAGNOSIS — C9000 Multiple myeloma not having achieved remission: Secondary | ICD-10-CM | POA: Diagnosis not present

## 2019-10-04 DIAGNOSIS — R279 Unspecified lack of coordination: Secondary | ICD-10-CM | POA: Diagnosis not present

## 2019-10-04 DIAGNOSIS — R2689 Other abnormalities of gait and mobility: Secondary | ICD-10-CM | POA: Diagnosis not present

## 2019-10-04 DIAGNOSIS — M6281 Muscle weakness (generalized): Secondary | ICD-10-CM | POA: Diagnosis not present

## 2019-10-04 DIAGNOSIS — R601 Generalized edema: Secondary | ICD-10-CM | POA: Diagnosis not present

## 2019-10-04 DIAGNOSIS — G20 Parkinson's disease: Secondary | ICD-10-CM | POA: Diagnosis not present

## 2019-10-05 DIAGNOSIS — R279 Unspecified lack of coordination: Secondary | ICD-10-CM | POA: Diagnosis not present

## 2019-10-05 DIAGNOSIS — R2689 Other abnormalities of gait and mobility: Secondary | ICD-10-CM | POA: Diagnosis not present

## 2019-10-05 DIAGNOSIS — C9000 Multiple myeloma not having achieved remission: Secondary | ICD-10-CM | POA: Diagnosis not present

## 2019-10-05 DIAGNOSIS — R601 Generalized edema: Secondary | ICD-10-CM | POA: Diagnosis not present

## 2019-10-05 DIAGNOSIS — G20 Parkinson's disease: Secondary | ICD-10-CM | POA: Diagnosis not present

## 2019-10-05 DIAGNOSIS — M6281 Muscle weakness (generalized): Secondary | ICD-10-CM | POA: Diagnosis not present

## 2019-10-08 DIAGNOSIS — C9000 Multiple myeloma not having achieved remission: Secondary | ICD-10-CM | POA: Diagnosis not present

## 2019-10-08 DIAGNOSIS — R601 Generalized edema: Secondary | ICD-10-CM | POA: Diagnosis not present

## 2019-10-08 DIAGNOSIS — G20 Parkinson's disease: Secondary | ICD-10-CM | POA: Diagnosis not present

## 2019-10-08 DIAGNOSIS — R279 Unspecified lack of coordination: Secondary | ICD-10-CM | POA: Diagnosis not present

## 2019-10-08 DIAGNOSIS — M6281 Muscle weakness (generalized): Secondary | ICD-10-CM | POA: Diagnosis not present

## 2019-10-08 DIAGNOSIS — R2689 Other abnormalities of gait and mobility: Secondary | ICD-10-CM | POA: Diagnosis not present

## 2019-10-09 DIAGNOSIS — C9000 Multiple myeloma not having achieved remission: Secondary | ICD-10-CM | POA: Diagnosis not present

## 2019-10-09 DIAGNOSIS — R2689 Other abnormalities of gait and mobility: Secondary | ICD-10-CM | POA: Diagnosis not present

## 2019-10-09 DIAGNOSIS — R601 Generalized edema: Secondary | ICD-10-CM | POA: Diagnosis not present

## 2019-10-09 DIAGNOSIS — M6281 Muscle weakness (generalized): Secondary | ICD-10-CM | POA: Diagnosis not present

## 2019-10-09 DIAGNOSIS — G20 Parkinson's disease: Secondary | ICD-10-CM | POA: Diagnosis not present

## 2019-10-09 DIAGNOSIS — R279 Unspecified lack of coordination: Secondary | ICD-10-CM | POA: Diagnosis not present

## 2019-10-10 DIAGNOSIS — C9000 Multiple myeloma not having achieved remission: Secondary | ICD-10-CM | POA: Diagnosis not present

## 2019-10-10 DIAGNOSIS — R279 Unspecified lack of coordination: Secondary | ICD-10-CM | POA: Diagnosis not present

## 2019-10-10 DIAGNOSIS — R2689 Other abnormalities of gait and mobility: Secondary | ICD-10-CM | POA: Diagnosis not present

## 2019-10-10 DIAGNOSIS — G20 Parkinson's disease: Secondary | ICD-10-CM | POA: Diagnosis not present

## 2019-10-10 DIAGNOSIS — I10 Essential (primary) hypertension: Secondary | ICD-10-CM | POA: Diagnosis not present

## 2019-10-10 DIAGNOSIS — M6281 Muscle weakness (generalized): Secondary | ICD-10-CM | POA: Diagnosis not present

## 2019-10-10 DIAGNOSIS — R601 Generalized edema: Secondary | ICD-10-CM | POA: Diagnosis not present

## 2019-10-12 DIAGNOSIS — G20 Parkinson's disease: Secondary | ICD-10-CM | POA: Diagnosis not present

## 2019-10-12 DIAGNOSIS — I10 Essential (primary) hypertension: Secondary | ICD-10-CM | POA: Diagnosis not present

## 2019-10-12 DIAGNOSIS — E782 Mixed hyperlipidemia: Secondary | ICD-10-CM | POA: Diagnosis not present

## 2019-10-12 DIAGNOSIS — I351 Nonrheumatic aortic (valve) insufficiency: Secondary | ICD-10-CM | POA: Diagnosis not present

## 2019-10-12 DIAGNOSIS — R609 Edema, unspecified: Secondary | ICD-10-CM | POA: Diagnosis not present

## 2019-10-15 ENCOUNTER — Other Ambulatory Visit: Payer: Self-pay

## 2019-10-15 ENCOUNTER — Other Ambulatory Visit: Payer: Medicare Other

## 2019-10-15 ENCOUNTER — Telehealth: Payer: Self-pay

## 2019-10-15 DIAGNOSIS — Z515 Encounter for palliative care: Secondary | ICD-10-CM

## 2019-10-15 NOTE — Progress Notes (Signed)
COMMUNITY PALLIATIVE CARE SW NOTE  PATIENT NAME: Adam Moss DOB: [birth year] MRN: [id number]  PRIMARY CARE PROVIDER: Perrin Maltese, MD  RESPONSIBLE PARTY:  Acct ID - Guarantor Home Phone Work Phone Relationship Acct Type  [id number] [name]  Self P/F     [address], [address], [place], [place] [address]     PLAN OF CARE and INTERVENTIONS:             1. GOALS OF CARE/ ADVANCE CARE PLANNING:  Patient is DNR. Facility keeps the copy. HCPOA is [name] (patient's son). Living Will is complete. Goal is to regain strength and continue to be independent.  2. SOCIAL/EMOTIONAL/SPIRITUAL ASSESSMENT/ INTERVENTIONS:  SW met with patient in his apartment. Patient discussed recent hospitalization and rehab stay. Patient said he is happy to be back at his apartment. Patient denies pain today. Patient said he feels weaker overall, less endurance. Patient said swelling in his legs is better. Patient is trying to regulate his hydration. Patient said his appetite is the same, snacks often. Patient reports he is sleeping "okay", wakes hourly to urinate and is using the urinal. Patient said cardiologist increased his lasix to 40 mg. Patient has also started Spironolactone 25 mg and Crestor 20 mg. SW discussed goals and care concerns, reviewed discharge plans, used active and reflective listening, and provided emotional support.  3. PATIENT/CAREGIVER EDUCATION/ COPING:  Patient has flat affect, was alert and oriented. Patient answers questions when asked. Patient acknowledged his mood has improved since being discharged from rehab. Patient denies coping concerns. Patient enjoys crosswords and is playing solitaire on the computer. Patient said his family came to see him last week and visit when they can. Family is supportive. 4. PERSONAL EMERGENCY PLAN:  Facility to follow protocol. 5. COMMUNITY RESOURCES COORDINATION/ HEALTH CARE NAVIGATION:  Patient is receiving PT/OT, nurse and an aide from  facility staff. Patient saw cardiology on Friday. Patient is scheduled to see neurology tomorrow. Patient's next appointment with Dr. Grayland Ormond is on 4/6, and he will do labs and infusion. 6. FINANCIAL/LEGAL CONCERNS/INTERVENTIONS:  None.     SOCIAL HX:  Social History   Tobacco Use  . Smoking status: Never Smoker  . Smokeless tobacco: Never Used  Substance Use Topics  . Alcohol use: No    Alcohol/week: 0.0 standard drinks    CODE STATUS:   Code Status: Prior (DNR) ADVANCED DIRECTIVES: Y MOST FORM COMPLETE:  No. HOSPICE EDUCATION PROVIDED: None.  PPS: Patient is mostly independent of ADLs, needs standby for bathing and dressing. Patient continues to ambulate with his walker.   I spent62mnutes with patient/family, from3:30-4:15pproviding education, support and consultation.  WMargaretmary Lombard LCSW

## 2019-10-15 NOTE — Telephone Encounter (Signed)
Telephone call to patient to schedule palliative care visit with patient. Patient/family in agreement with home visit on 10-15-19 at 3:30PM.

## 2019-10-16 ENCOUNTER — Ambulatory Visit (INDEPENDENT_AMBULATORY_CARE_PROVIDER_SITE_OTHER): Payer: Medicare Other | Admitting: Neurology

## 2019-10-16 ENCOUNTER — Encounter: Payer: Self-pay | Admitting: Neurology

## 2019-10-16 ENCOUNTER — Other Ambulatory Visit: Payer: Self-pay

## 2019-10-16 VITALS — BP 111/73 | HR 89 | Temp 97.3°F | Ht 70.5 in | Wt 178.0 lb

## 2019-10-16 DIAGNOSIS — G20 Parkinson's disease: Secondary | ICD-10-CM | POA: Diagnosis not present

## 2019-10-16 DIAGNOSIS — Z9289 Personal history of other medical treatment: Secondary | ICD-10-CM | POA: Diagnosis not present

## 2019-10-16 DIAGNOSIS — M7989 Other specified soft tissue disorders: Secondary | ICD-10-CM

## 2019-10-16 NOTE — Patient Instructions (Signed)
As discussed, we will continue with your current medication, Rytary 195 mg strength 1 pill 5 times a day.  Your prescription should be up-to-date.  Please continue to use your walker at all times for safety.  Please try to hydrate well with water and monitor your appetite and your weight.

## 2019-10-16 NOTE — Progress Notes (Signed)
Subjective:    Patient ID: Adam Moss is a 86 y.o. male.  HPI     Interim history:   [name] is a very pleasant 86 year old left-handed gentleman with an underlying medical history of degenerative spine disease, allergies, BPH, hypertension, arthritis, reflux disease, and insomnia, who presents for Follow-up consultation of his Parkinson's disease. He came with transportation and requested that [name] be on speaker phone. I last saw him in a virtual visit on 08/21/2019, at which time he requested a sooner than his scheduled appointment for his foot pain. I did not suggest any new medications at the time.  We talked about potentially trying gabapentin if needed.  His daughter-in-law indicated that she might have him try CBD oil.  Today, 10/16/19: He reports doing better, stable as far as motor symptoms of PD, with the exception of perhaps more tremor at times. He has lost some weight, appetite less. He has been seen by cardiology, Dr. Neoma Laming for what sounds like Aortic insuff. Of note, he presented to the ER due to weakness and constipation.  He was admitted to the hospital in February from 09/10/2019 through 09/13/2019, he was suspected to have a urinary tract infection and was also treated for dehydration.   He is trying to hydrate better. Constipation is an issue intermittently. He has significant nocturia, typically uses a urinal at night. He has a rolling walker. He was in SNF for rehab, now back in indep. Living with an aid coming in daily.  Of note, his foot pain improved in the recent past, seemingly on its own.   The patient's allergies, current medications, family history, past medical history, past social history, past surgical history and problem list were reviewed and updated as appropriate.    Previously (copied from previous notes for reference):   a virtual visit via [place] video visit for follow-up consultation of his Parkinson's disease. The patient is accompanied  by his daughter-in-law, [name], today and joins via laptop from his residence, I am located in my office.  He requested a sooner than scheduled appointment for foot pain.   I saw him on 04/18/2019, at which time he reported interim stress, he was not sleeping very well, he had an increase in his tremor on the left side.  He was noted to have edema.  He was hospitalized for this and was noted to have lymphedema.  He was noted to have several bony lytic lesions and had a pathological fracture of his right clavicle.  He was eventually diagnosed with multiple myeloma.  He is being followed by oncology for this.  He was advised to start taking melatonin at night for sleep and continue with his Parkinson's medication.     Of note, he missed an appointment on 02/19/2019.  I saw him on 08/21/2018, at which time he reported feeling fairly stable, with the exception of increase in tremor noted.  He was on Rytary 4 times a day about 6 hourly.  He was advised to try to increase the Rytary 195 mg strength to 1 pill 5 times a day, about 5 hourly.     02/15/2018, at which time he reported feeling stable. He had occasional issues with constipation and had some mild forgetfulness, otherwise was doing well. I suggested he continue with his Rytary.    I saw him on 08/18/2017, at which time he felt stable. We continued with his Rytary, 195 mg qid.      I saw him on 04/18/17, at which  time he reported worsening tremor, he was having some difficulty with fine motor control. He was taking Rytary 4 times a day and at bedtime. He was trying to keep set schedule for his bedtime routine and wake up routine and medication regimen. I suggested he change the timing of his medication to 1 pill at 6, 11, 4 PM and 10 PM. We talked about potentially increasing the medication to 1 pill 5 times a day.   I saw him on 10/13/2016, at which time he reported doing okay. He had occasional muscle pain in different areas of his lower back and also  thighs, sometimes around the ankles. He was trying to walk on a regular basis. Memory mood were stable. Sadly, his wife passed away in 07/04/2016. I suggested, we keep his meds the same.     We had to cancel an appointment for 06/08/2016 and he missed an appointment on 07/21/2016. I saw him on 02/04/2016, at which time he reported doing okay, tolerating Rytary 195 mg qid without significant side effects. He was not always exercising regularly. He does visit his wife in the afternoon every day. He was not always hydrating well enough. He had gained some weight. He had no recent falls, no complaints of hallucinations or memory issues, occasional depressed mood but no sustained symptoms of depression. I suggested we continue with his medication regimen. He was encouraged to hydrate better and exercise on a more regular basis.   I saw him on 09/24/2015, at which time he reported doing a little better, able to tolerate the Rytary qid. Thankfully, h had no recent falls. He was not sleeping very well at night. He would visit his wife in memory care every day. His 3 children with visit about once a month. Is trying to hydrate well. He had noticed some ankle swelling and residual knee pain bilaterally. He was able to pursue his hobby of building model ships. I suggested he continue with Rytary at the current dose. He was encouraged to try melatonin for sleep.    I saw him on 05/26/2015, at which time he reported doing somewhat better with the new medication, Rytary, with improvement noted in fine motor skills and tremors. He had recent blood work through his primary care physician which I reviewed at the time: Lipid profile was unremarkable with the exception of triglycerides borderline at 155, BMP was normal and CBC was normal. He reported that his leg swelling was a little better. He was avoiding nighttime driving. He was visiting his wife and memory care regularly. He was able to tolerate the new medication which  was really reassuring. She was taking Rytary 195 mg, one pill at 9 AM, 1 pill at 5 PM, 1 pill at bedtime which is usually around midnight. He had gained about 10 pounds and was trying to lose weight. He worried about his weight gain but did admit to having good food and lots of choices for dessert at his assisted living place. I suggested a gentle increase in his medication to 1 pill 4 times a day.   I first met him on 02/21/2015 at the request of his primary neurologist, at which time the patient reported a history of left-sided tremor, fine motor dyscontrol, and gait difficulties. Symptoms dated back to 2-3 years prior. He had multiple medication intolerances particularly issues with GI side effects. I suggested a trial of Rytary, 95 mg strength with titration to 2 pills 3 times a day. I also suggested we  proceed with a DaT scan and I referred him to [facility] nuclear medicine. He had the study on 03/19/2015 which showed abnormal image grade 1: Asymmetric uptake with normal or almost normal putamen activity in one hemisphere and with a more marked reduction in the contralateral putamen. This pattern of activity can be seen with Parkinson's disease or related syndromes.    Of note, the radiotracer uptake was decreased on the right. We called the patient with the test results. The patient emailed me in August reporting a slight decrease in his tremor with a new medication and ability to tolerated thus far. He asked for change and pill strength of possible because the medication is very expensive. I changed him to 195 mg strength one pill 3 times a day.    02/21/2015: He has a history of left-sided tremor, fine motor dyscontrol, and gait difficulty. 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 Symptoms date back to 2014 or the year before, when he started having difficulty  getting out of a chair, tremors on the left side, slowness, and he started seeing you at the neuroscience center in [facility]. He was diagnosed with Parkinson's disease and tried on different medications but had side effects, primarily GI related side effects. He has a history of cholecystectomy. Sinemet caused severe abdominal pain and nausea. He was tried on Neupro which also caused stomach pain, and on Azilect low-dose he did not think he had any response. He has been on amantadine. His previous records were reviewed: This includes neurologic office notes from 01/10/2015, 11/08/2014, 09/09/2014, 08/19/2014, 05/20/2014, 02/18/2014, 11/30/2013, 07/06/2013, 06/01/2013, and 02/23/2013. He had a brain MRI years ago but results are not available for my review. He is currently on amantadine 100 mg twice daily. In 2015 he was tried on pramipexole but had severe GI upset. In July 2015 he reported that he had improvement with Neupro but stomach pain. In January 2016 he tried Sinemet but had to stop after a few days only. Earlier this year his Azilect was discontinued. He has no family history of Parkinson's disease. He reports mild memory loss and mild sleep issues. He currently resides in independent living at [facility] [place]. This is in [place]. His wife is a Marine scientist care. He drives. He has no recent history of falls. He tries to stay active physically. He has right knee pain and wears a soft brace around his right knee. He has a remote history of injury to his right knee. He has been off of amantadine for the past 2 weeks or so. He had significant blurry vision while on it to the point where he had to see an ophthalmologist. His blurry vision has improved since he stopped the amantadine. He feels that his symptoms have been slowly progressive. He is primarily bothered by his fine motor dyscontrol and tremor. He drives without significant problems but does not drive long distances.   His Past Medical  History Is Significant For: Past Medical History:  Diagnosis Date  . Cancer ([place])    skin  . COPD (chronic obstructive pulmonary disease) ([place])   . Hyperlipemia   . Hypertension   . Lymphedema of both lower extremities   . Parkinson disease ([place])   . Parkinsonism ([place]) 02/21/2015  . Spinal stenosis   . Tremor     His Past Surgical History Is  Significant For: Past Surgical History:  Procedure Laterality Date  . APPENDECTOMY    . CATARACT EXTRACTION    . CHOLECYSTECTOMY    . TONSILLECTOMY    . VASECTOMY      His Family History Is Significant For: Family History  Problem Relation Age of Onset  . Cancer Mother   . Heart disease Father     His Social History Is Significant For: Social History   Socioeconomic History  . Marital status: Married    Spouse name: Not on file  . Number of children: 3  . Years of education: PhD  . Highest education level: Not on file  Occupational History  . Occupation: Retired  Tobacco Use  . Smoking status: Never Smoker  . Smokeless tobacco: Never Used  Substance and Sexual Activity  . Alcohol use: No    Alcohol/week: 0.0 standard drinks  . Drug use: No  . Sexual activity: Not Currently  Other Topics Concern  . Not on file  Social History Narrative   Denies caffeine use    Social Determinants of Health   Financial Resource Strain:   . Difficulty of Paying Living Expenses:   Food Insecurity:   . Worried About Charity fundraiser in the Last Year:   . Arboriculturist in the Last Year:   Transportation Needs:   . Film/video editor (Medical):   Marland Kitchen Lack of Transportation (Non-Medical):   Physical Activity:   . Days of Exercise per Week:   . Minutes of Exercise per Session:   Stress:   . Feeling of Stress :   Social Connections:   . Frequency of Communication with Friends and Family:   . Frequency of Social Gatherings with Friends and Family:   . Attends Religious Services:   . Active Member of Clubs or Organizations:    . Attends Archivist Meetings:   Marland Kitchen Marital Status:     His Allergies Are:  Allergies  Allergen Reactions  . Carbidopa-Levodopa Other (See Comments)    Severe stomach pains, can take rytary  . Oxycodone-Acetaminophen Other (See Comments)    "boils on skin"  . Tyloxapol   . Acetaminophen Rash, Nausea And Vomiting and Hives  . Pramipexole Nausea Only  :   His Current Medications Are:  Outpatient Encounter Medications as of 10/16/2019  Medication Sig  . amoxicillin-clavulanate (AUGMENTIN) 875-125 MG tablet Take 1 tablet by mouth 2 (two) times daily.  Marland Kitchen aspirin 81 MG tablet Take 81 mg by mouth daily.  . Calcium Carbonate-Vitamin D (OYSTER SHELL CALCIUM/D) 250-125 MG-UNIT TABS Take by mouth.  . Calcium Citrate 250 MG TABS Take 1 tablet by mouth daily.   . Carbidopa-Levodopa ER (RYTARY) 48.75-195 MG CPCR Take 195 mg by mouth 5 (five) times daily.  . cetirizine (ZYRTEC) 10 MG tablet Take 10 mg by mouth daily.  . cyanocobalamin (,VITAMIN B-12,) 1000 MCG/ML injection   . Ferrous Sulfate (IRON) 28 MG TABS Take 1 tablet (28 mg total) by mouth daily. Or can take any other iron supplement.  . finasteride (PROSCAR) 5 MG tablet Take 5 mg by mouth daily.  . fluticasone (FLONASE) 50 MCG/ACT nasal spray Place into both nostrils daily.  . fluticasone furoate-vilanterol (BREO ELLIPTA) 100-25 MCG/INH AEPB USE 1 INHALATION ORALLY    DAILY  . Fluticasone-Umeclidin-Vilant (TRELEGY ELLIPTA) 100-62.5-25 MCG/INH AEPB Trelegy Ellipta 100-62.5-25 MCG/INH Inhalation Aerosol Powder Breath Activated QTY: 3 inhaler Days: 90 Refills: 3  Written: 08/02/19 Patient Instructions: inhale 1 puff by mouth  once daily. Rinse mouth well after use.  . furosemide (LASIX) 20 MG tablet Take 40 mg by mouth daily.   Marland Kitchen ipratropium (ATROVENT) 0.03 % nasal spray Place 0.03 mLs into both nostrils 3 (three) times daily as needed.  Marland Kitchen lenalidomide (REVLIMID) 10 MG capsule Take 1 capsule (10 mg total) by mouth daily.  . meloxicam  (MOBIC) 15 MG tablet Take 1 tablet (15 mg total) by mouth daily as needed for pain.  . methocarbamol (ROBAXIN) 500 MG tablet methocarbamol 500 mg tablet  TAKE 1 TABLET BY MOUTH EVERY DAY  . niacin 500 MG tablet Take 500 mg by mouth every morning.   . olmesartan (BENICAR) 20 MG tablet Hold until PCP followup because blood pressure on the low side in the hospital.  . omeprazole (PRILOSEC) 40 MG capsule Take 40 mg by mouth daily.  . rosuvastatin (CRESTOR) 20 MG tablet Take 20 mg by mouth at bedtime.  . Simethicone (GAS RELIEF PO) Take 1 Dose by mouth as needed.   Marland Kitchen spironolactone (ALDACTONE) 25 MG tablet Take 25 mg by mouth daily.  . tamsulosin (FLOMAX) 0.4 MG CAPS capsule Take 0.4 mg by mouth daily.   . vitamin C (ASCORBIC ACID) 500 MG tablet Take 500 mg by mouth daily.  . [DISCONTINUED] simvastatin (ZOCOR) 10 MG tablet Take 10 mg by mouth daily.   No facility-administered encounter medications on file as of 10/16/2019.  :  Review of Systems:  Out of a complete 14 point review of systems, all are reviewed and negative with the exception of these symptoms as listed below: Review of Systems  Neurological:       Pt presents today to discuss his PD. Pt reports that he is doing okay. He was recently diagnosed with a "heart valve problem."    Objective:  Neurological Exam  Physical Exam Physical Examination:   Vitals:   10/16/19 1118  BP: 111/73  Pulse: 89  Temp: (!) 97.3 F (36.3 C)    General Examination: The patient is a very pleasant 86 y.o. male in no acute distress. He appears more frail. Seated on his rolling walker seat.  HEENT:Normocephalic, atraumatic, pupils are equal, round and reactive to light. Corrective eyeglasses in place. He has mild saccadic breakdown onsmooth pursuit testing, no nystagmus is noted. He had cataract repair on the left,mild decrease in eye blink rate and mild to moderate facial masking.He has no significant drooling, oropharynx examination reveals  mild mouth dryness, tongue protrudes centrally and palate elevates symmetrically, mild to moderate hypophonia noted, perhaps slight dysarthria.  Chest:Clear to auscultation without wheezing, rhonchi or crackles noted.  Heart:S1+S2+0, regular and normal without murmurs, rubs or gallops noted.   Abdomen:Soft, non-tender and non-distended with normal bowel sounds appreciated on auscultation.  Extremities:There is2+edema in the distal lower extremities bilaterally,compression socks in place.  Skin: Warm and dry without trophic changes noted.  Musculoskeletal: exam reveals a no change.  Neurologically:  Mental status: The patient is awake, alert and oriented in all 4 spheres. Hisimmediate and remote memory, attention, language skills and fund of knowledge are fairly appropriate. There is mild slowness in thinking. Thought process is linear. Mood is normaland affect is normal.  Cranial nerves II - XII are as described above under HEENT exam. In addition:Motor exam: Normal bulk, and strength globally of 4/5. Lower extremity swelling bilaterally makes it difficult to assess strength as well.  He has a mildly increased tone on the left side, worse compared to before, he has a mild to moderate  resting tremor in the left upper extremity, no obvious resting tremor noted otherwise.  Romberg is not testable due to safety reasons. Fine motor testing: he is moderately impaired on the left, slightly better on the R.  Cerebellar testing: No dysmetria or intention tremor. There is no truncal or gait ataxia.  Sensory exam: intact to light touch in the upper and lower extremities.  Gait, station and balance: [place] with Difficulty and uses his left hand to push up, he requires no assistance, posture is mild to moderately stooped, slightly worse, balance is mildly impaired, probably also slightly worse. He uses his rolling walker well.   Assessmentand Plan:   In summary, Rudolf Blizard  a very pleasant 86 year old malewith anunderlying medical history of degenerative spine disease, allergies, BPH, arthritis, reflux disease, and hypertension, who presents for follow-up consultation of his left-sided predominant Parkinson's disease, complicated by significant medication intolerancesin the past, mostly secondary to GI related symptoms including stomach pain, exacerbation of reflux, nausea and vomiting. He was unable to tolerate Mirapex and Sinemetin the past.Fortunately, he has been able to tolerate Rytarybrand-name, which was started at 95 mg strength, up to 2 pills 3 times a day and then we switched to 195 mg strength, 1 pill3 times a day, then 4 times a day, with improvement in symptoms including tremor and fine motor skills. He is now maintaining on 1 pill 5 times a day. He has had interim complications, including a pathologic right clavicle fracture and subsequent testing also revealed other spots in the bones, concerning for lytic lesions. He developed b/l foot pain, which per patient has resolved after his hospital stay. He was in SNF for rehab until this month. He now has a rolling walker.  He had dehydration and intermittent ongoing issues with constipation. He has had issues with sleep fragmentation, frequent nocturia. His family is involved and he has good support. He is back in indep. Living and has an aid come in daily.  He is advised to continue with the Rytary at the current dose.  We talked about the importance of good hydration, fall prevention and constipation prevention.  He has been seen by cardiology and has FU pending for next month.  Of note, he had a DaT scan in August 2016 which was supportive of Parkinson's disease with decreased radiotracer uptake on the right.I suggested a 34-monthfollow-up. I answered all their questions today and The patient and his daughter-in-law were in agreement.  I spent 30 minutes in total face-to-face time and in reviewing records during  pre-charting, more than 50% of which was spent in counseling and coordination of care, reviewing test results, reviewing medications and treatment regimen and/or in discussing or reviewing the diagnosis of PD, the prognosis and treatment options. Pertinent laboratory and imaging test results that were available during this visit with the patient were reviewed by me and considered in my medical decision making (see chart for details).

## 2019-10-17 ENCOUNTER — Telehealth: Payer: Self-pay | Admitting: Urology

## 2019-10-17 NOTE — Telephone Encounter (Signed)
Please call Mr. Sneeringer and have him reschedule his missed appointment with Dr. Diamantina Providence for BPH.

## 2019-10-18 DIAGNOSIS — R2689 Other abnormalities of gait and mobility: Secondary | ICD-10-CM | POA: Diagnosis not present

## 2019-10-18 DIAGNOSIS — G20 Parkinson's disease: Secondary | ICD-10-CM | POA: Diagnosis not present

## 2019-10-18 DIAGNOSIS — M6281 Muscle weakness (generalized): Secondary | ICD-10-CM | POA: Diagnosis not present

## 2019-10-18 DIAGNOSIS — R279 Unspecified lack of coordination: Secondary | ICD-10-CM | POA: Diagnosis not present

## 2019-10-18 NOTE — Telephone Encounter (Signed)
Appointment made. Patient aware. 

## 2019-10-24 DIAGNOSIS — R2689 Other abnormalities of gait and mobility: Secondary | ICD-10-CM | POA: Diagnosis not present

## 2019-10-24 DIAGNOSIS — R279 Unspecified lack of coordination: Secondary | ICD-10-CM | POA: Diagnosis not present

## 2019-10-24 DIAGNOSIS — M6281 Muscle weakness (generalized): Secondary | ICD-10-CM | POA: Diagnosis not present

## 2019-10-24 DIAGNOSIS — G20 Parkinson's disease: Secondary | ICD-10-CM | POA: Diagnosis not present

## 2019-10-25 ENCOUNTER — Other Ambulatory Visit: Payer: Self-pay | Admitting: *Deleted

## 2019-10-25 DIAGNOSIS — C9000 Multiple myeloma not having achieved remission: Secondary | ICD-10-CM

## 2019-10-25 MED ORDER — LENALIDOMIDE 10 MG PO CAPS: 10.0000 mg | ORAL_CAPSULE | Freq: Every day | ORAL | 0 refills | Status: DC

## 2019-10-25 NOTE — Progress Notes (Signed)
[place]  [phone] [phone]  ID: Adam Moss OB: 1934-06-02  MR#: [id number]  SJG#:[id number]  Patient Care Team: Perrin Maltese, MD as PCP - General (Internal Medicine) Lloyd Huger, MD as Consulting Physician (Hematology and Oncology)   CHIEF COMPLAINT: Kappa light chain myeloma.   INTERVAL HISTORY: Patient returns to clinic today for repeat laboratory work, further evaluation, and continuation of treatment.  He has a decreased appetite as well as weight loss. He continues to have chronic weakness and fatigue.  He has a resting tremor secondary to his Parkinson's, but no other neurologic complaints.  He denies any recent fevers or illnesses.  He has no chest pain, shortness of breath, cough, or hemoptysis.  He denies any nausea, vomiting, constipation, or diarrhea.  He has no urinary complaints.  Patient offers no further specific complaints today.  REVIEW OF SYSTEMS:   Review of Systems  Constitutional: Positive for malaise/fatigue and weight loss. Negative for fever.  Respiratory: Negative.  Negative for cough, hemoptysis and shortness of breath.   Cardiovascular: Negative.  Negative for chest pain and leg swelling.  Gastrointestinal: Negative.  Negative for abdominal pain and constipation.  Genitourinary: Negative.  Negative for dysuria.  Musculoskeletal: Negative.  Negative for back pain.  Skin: Negative.  Negative for rash.  Neurological: Positive for tremors and weakness. Negative for dizziness, focal weakness and headaches.  Psychiatric/Behavioral: Negative.  The patient is not nervous/anxious.     As per HPI. Otherwise, a complete review of systems is negative.  PAST MEDICAL HISTORY: Past Medical History:  Diagnosis Date  . Cancer ([place])    skin  . COPD (chronic obstructive pulmonary disease) ([place])   . Hyperlipemia   . Hypertension   . Lymphedema of both lower extremities   . Parkinson disease ([place])   .  Parkinsonism ([place]) 02/21/2015  . Spinal stenosis   . Tremor     PAST SURGICAL HISTORY: Past Surgical History:  Procedure Laterality Date  . APPENDECTOMY    . CATARACT EXTRACTION    . CHOLECYSTECTOMY    . TONSILLECTOMY    . VASECTOMY      FAMILY HISTORY: Family History  Problem Relation Age of Onset  . Cancer Mother   . Heart disease Father     ADVANCED DIRECTIVES (Y/N):  N  HEALTH MAINTENANCE: Social History   Tobacco Use  . Smoking status: Never Smoker  . Smokeless tobacco: Never Used  Substance Use Topics  . Alcohol use: No    Alcohol/week: 0.0 standard drinks  . Drug use: No     Colonoscopy:  PAP:  Bone density:  Lipid panel:  Allergies  Allergen Reactions  . Carbidopa-Levodopa Other (See Comments)    Severe stomach pains, can take rytary  . Oxycodone-Acetaminophen Other (See Comments)    "boils on skin"  . Tyloxapol   . Acetaminophen Rash, Nausea And Vomiting and Hives  . Pramipexole Nausea Only    Current Outpatient Medications  Medication Sig Dispense Refill  . aspirin 81 MG tablet Take 81 mg by mouth daily.    . Calcium Carbonate-Vitamin D (OYSTER SHELL CALCIUM/D) 250-125 MG-UNIT TABS Take by mouth.    . Calcium Citrate 250 MG TABS Take 1 tablet by mouth daily.     . Carbidopa-Levodopa ER (RYTARY) 48.75-195 MG CPCR Take 195 mg by mouth 5 (five) times daily. 450 capsule 3  . cetirizine (ZYRTEC) 10 MG tablet Take 10 mg by mouth daily.    . finasteride (PROSCAR) 5 MG  tablet Take 5 mg by mouth daily.    . fluticasone (FLONASE) 50 MCG/ACT nasal spray Place into both nostrils daily.    . fluticasone furoate-vilanterol (BREO ELLIPTA) 100-25 MCG/INH AEPB USE 1 INHALATION ORALLY    DAILY 180 each 3  . Fluticasone-Umeclidin-Vilant (TRELEGY ELLIPTA) 100-62.5-25 MCG/INH AEPB Trelegy Ellipta 100-62.5-25 MCG/INH Inhalation Aerosol Powder Breath Activated QTY: 3 inhaler Days: 90 Refills: 3  Written: 08/02/19 Patient Instructions: inhale 1 puff by mouth once daily.  Rinse mouth well after use.    . furosemide (LASIX) 20 MG tablet Take 40 mg by mouth daily.     Marland Kitchen ipratropium (ATROVENT) 0.03 % nasal spray Place 0.03 mLs into both nostrils 3 (three) times daily as needed.  11  . niacin 500 MG tablet Take 500 mg by mouth every morning.     Marland Kitchen omeprazole (PRILOSEC) 40 MG capsule Take 40 mg by mouth daily.    . potassium chloride SA (KLOR-CON) 20 MEQ tablet Take 1 tablet (20 mEq total) by mouth 2 (two) times daily. 60 tablet 1  . prochlorperazine (COMPAZINE) 10 MG tablet Take 1 tablet (10 mg total) by mouth every 6 (six) hours as needed for nausea or vomiting. 30 tablet 0  . rosuvastatin (CRESTOR) 20 MG tablet Take 20 mg by mouth at bedtime.    . Simethicone (GAS RELIEF PO) Take 1 Dose by mouth as needed.     Marland Kitchen spironolactone (ALDACTONE) 25 MG tablet Take 25 mg by mouth daily.    . tamsulosin (FLOMAX) 0.4 MG CAPS capsule Take 0.4 mg by mouth daily.     . vitamin B-12 (CYANOCOBALAMIN) 1000 MCG tablet Take 1,000 mcg by mouth daily.    . vitamin C (ASCORBIC ACID) 500 MG tablet Take 500 mg by mouth daily.    . Ferrous Sulfate (IRON) 28 MG TABS Take 1 tablet (28 mg total) by mouth daily. Or can take any other iron supplement. (Patient not taking: Reported on 10/29/2019)    . lenalidomide (REVLIMID) 10 MG capsule Take 2 capsules (20 mg total) by mouth as directed. Take 2 capsules daily for 21 days then 7 days off 42 capsule 5  . meloxicam (MOBIC) 15 MG tablet Take 1 tablet (15 mg total) by mouth daily as needed for pain. (Patient not taking: Reported on 10/29/2019)     No current facility-administered medications for this visit.    OBJECTIVE: Vitals:   10/30/19 1319  BP: 112/76  Pulse: 87  Resp: 20  Temp: (!) 96 F (35.6 C)  SpO2: 100%     Body mass index is 24.03 kg/m.    ECOG FS:1 - Symptomatic but completely ambulatory  General: Well-developed, well-nourished, no acute distress.  Sitting in a wheelchair. Eyes: Pink conjunctiva, anicteric sclera. HEENT:  Normocephalic, moist mucous membranes. Lungs: No audible wheezing or coughing. Heart: Regular rate and rhythm. Abdomen: Soft, nontender, no obvious distention. Musculoskeletal: No edema, cyanosis, or clubbing. Neuro: Alert, answering all questions appropriately. Cranial nerves grossly intact. Skin: No rashes or petechiae noted. Psych: Normal affect.  LAB RESULTS:  Lab Results  Component Value Date   NA 133 (L) 10/30/2019   K 2.2 (LL) 10/30/2019   CL 91 (L) 10/30/2019   CO2 30 10/30/2019   GLUCOSE 167 (H) 10/30/2019   BUN 23 10/30/2019   CREATININE 1.30 (H) 10/30/2019   CALCIUM 8.6 (L) 10/30/2019   PROT 7.0 10/30/2019   ALBUMIN 3.7 10/30/2019   AST 30 10/30/2019   ALT 6 10/30/2019   ALKPHOS 88 10/30/2019  BILITOT 0.9 10/30/2019   GFRNONAA 50 (L) 10/30/2019   GFRAA 58 (L) 10/30/2019    Lab Results  Component Value Date   WBC 4.5 10/30/2019   NEUTROABS 2.1 10/30/2019   HGB 11.0 (L) 10/30/2019   HCT 34.4 (L) 10/30/2019   MCV 88.7 10/30/2019   PLT 162 10/30/2019     STUDIES: No results found.  ASSESSMENT: Kappa light chain myeloma.  PLAN:    1. Kappa light chain myeloma: Bone marrow biopsy on May 01, 2019 revealed increased plasma cells of 30 to 40% with kappa light chain restriction.  SPEP is negative and IgG, IgA, and IgM are essentially within normal limits.  Patient's kappa free light chains as well as her kappa/lambda light chain ratio continue to trend up and are now 757.6 and 39.66 respectively.  Today's result is pending.  Hemoglobin is trended up to 11.0, but kidney function is worse with a creatinine of 1.3.  He has no other evidence of endorgan damage other than his right clavicle pathologic fracture. Although nuclear med bone scan was unrevealing, CT of the abdomen pelvis did reveal multiple osseous lytic lesions highly suspicious for underlying multiple myeloma.  Patient wishes to pursue treatment, although given his underlying Parkinson's and advanced age  does not wish to pursue fully aggressive chemotherapy.  Patient initiated low-dose 10 mg Revlimid daily at the beginning of November 2020.  Given his rising kappa chains, will change Revlimid dosing to 20 mg daily for 21 days with 7 days off.  Hold Zometa temporarily secondary to possible upcoming dental procedure.  Return to clinic in 4 weeks for repeat laboratory work, further evaluation, and continuation of treatment.   2.  Right clavicle fracture: Pathologic.  Resolved.  Patient reports no further interventions are needed by orthopedics. 3.  Hypocalcemia: Chronic and unchanged.  Calcium has improved to 8.6 today. 4.  Iron deficiency anemia: Hemoglobin continues to improve to 11.0. 5.  Thrombocytopenia: Resolved.  Platelet count 162. 6.  Leukopenia: Resolved. 7.  Constipation: Patient does not complain of this today.  Continue stool softener as directed. 8.  Hypokalemia: Patient will receive IV potassium supplementation today.  He was also given a prescription for oral supplementation. 9.  Dental pain: Hold Zometa until after patient's dental procedure.  Patient expressed understanding and was in agreement with this plan. He also understands that He can call clinic at any time with any questions, concerns, or complaints.    Lloyd Huger, MD   10/31/2019 12:13 PM

## 2019-10-26 DIAGNOSIS — R2689 Other abnormalities of gait and mobility: Secondary | ICD-10-CM | POA: Diagnosis not present

## 2019-10-26 DIAGNOSIS — R279 Unspecified lack of coordination: Secondary | ICD-10-CM | POA: Diagnosis not present

## 2019-10-26 DIAGNOSIS — M6281 Muscle weakness (generalized): Secondary | ICD-10-CM | POA: Diagnosis not present

## 2019-10-26 DIAGNOSIS — G20 Parkinson's disease: Secondary | ICD-10-CM | POA: Diagnosis not present

## 2019-10-29 ENCOUNTER — Telehealth: Payer: Self-pay | Admitting: Emergency Medicine

## 2019-10-29 ENCOUNTER — Encounter: Payer: Self-pay | Admitting: Oncology

## 2019-10-29 ENCOUNTER — Other Ambulatory Visit: Payer: Self-pay

## 2019-10-29 DIAGNOSIS — Z131 Encounter for screening for diabetes mellitus: Secondary | ICD-10-CM | POA: Diagnosis not present

## 2019-10-29 DIAGNOSIS — D519 Vitamin B12 deficiency anemia, unspecified: Secondary | ICD-10-CM | POA: Diagnosis not present

## 2019-10-29 DIAGNOSIS — R5383 Other fatigue: Secondary | ICD-10-CM | POA: Diagnosis not present

## 2019-10-29 DIAGNOSIS — F321 Major depressive disorder, single episode, moderate: Secondary | ICD-10-CM | POA: Diagnosis not present

## 2019-10-29 DIAGNOSIS — R799 Abnormal finding of blood chemistry, unspecified: Secondary | ICD-10-CM | POA: Diagnosis not present

## 2019-10-29 DIAGNOSIS — I10 Essential (primary) hypertension: Secondary | ICD-10-CM | POA: Diagnosis not present

## 2019-10-29 DIAGNOSIS — N401 Enlarged prostate with lower urinary tract symptoms: Secondary | ICD-10-CM | POA: Diagnosis not present

## 2019-10-29 DIAGNOSIS — E782 Mixed hyperlipidemia: Secondary | ICD-10-CM | POA: Diagnosis not present

## 2019-10-29 DIAGNOSIS — J449 Chronic obstructive pulmonary disease, unspecified: Secondary | ICD-10-CM | POA: Diagnosis not present

## 2019-10-29 DIAGNOSIS — E559 Vitamin D deficiency, unspecified: Secondary | ICD-10-CM | POA: Diagnosis not present

## 2019-10-29 NOTE — Progress Notes (Signed)
Pt reports no appetite for about a month, has lost approximately 10 pounds. Reports that he's currently out of Revlimid, [facility] shows that pt survey is required. Pt would like to talk to Dr. Grayland Ormond about continuing Revlimid.

## 2019-10-29 NOTE — Telephone Encounter (Signed)
Returned call to pt's daughter regarding pt having trouble getting revlimid refilled and her concern that pt is taking revlimid every other day. Told [name] that refill had been sent to pt's pharmacy, however it had not been filled yet due to it needing a patient survey in order for the prescription to be filled. Provided [name] with [employer] customer support number so they can help pt complete survey, but will talk with Bethena Roys about how to best help patient complete survey every month, as earlier patient indicated that someone usually calls him.

## 2019-10-30 ENCOUNTER — Inpatient Hospital Stay: Payer: Medicare Other | Attending: Oncology

## 2019-10-30 ENCOUNTER — Inpatient Hospital Stay: Payer: Medicare Other

## 2019-10-30 ENCOUNTER — Encounter: Payer: Self-pay | Admitting: Oncology

## 2019-10-30 ENCOUNTER — Inpatient Hospital Stay (HOSPITAL_BASED_OUTPATIENT_CLINIC_OR_DEPARTMENT_OTHER): Payer: Medicare Other | Admitting: Oncology

## 2019-10-30 VITALS — BP 112/76 | HR 87 | Temp 96.0°F | Resp 20 | Wt 169.9 lb

## 2019-10-30 DIAGNOSIS — E876 Hypokalemia: Secondary | ICD-10-CM | POA: Insufficient documentation

## 2019-10-30 DIAGNOSIS — C9000 Multiple myeloma not having achieved remission: Secondary | ICD-10-CM

## 2019-10-30 LAB — COMPREHENSIVE METABOLIC PANEL
ALT: 6 U/L (ref 0–44)
AST: 30 U/L (ref 15–41)
Albumin: 3.7 g/dL (ref 3.5–5.0)
Alkaline Phosphatase: 88 U/L (ref 38–126)
Anion gap: 12 (ref 5–15)
BUN: 23 mg/dL (ref 8–23)
CO2: 30 mmol/L (ref 22–32)
Calcium: 8.6 mg/dL — ABNORMAL LOW (ref 8.9–10.3)
Chloride: 91 mmol/L — ABNORMAL LOW (ref 98–111)
Creatinine, Ser: 1.3 mg/dL — ABNORMAL HIGH (ref 0.61–1.24)
GFR calc Af Amer: 58 mL/min — ABNORMAL LOW (ref >60)
GFR calc non Af Amer: 50 mL/min — ABNORMAL LOW (ref >60)
Glucose, Bld: 167 mg/dL — ABNORMAL HIGH (ref 70–99)
Potassium: 2.2 mmol/L — CL (ref 3.5–5.1)
Sodium: 133 mmol/L — ABNORMAL LOW (ref 135–145)
Total Bilirubin: 0.9 mg/dL (ref 0.3–1.2)
Total Protein: 7 g/dL (ref 6.5–8.1)

## 2019-10-30 LAB — CBC WITH DIFFERENTIAL/PLATELET
Abs Immature Granulocytes: 0.02 10*3/uL (ref 0.00–0.07)
Basophils Absolute: 0 10*3/uL (ref 0.0–0.1)
Basophils Relative: 1 %
Eosinophils Absolute: 0.1 10*3/uL (ref 0.0–0.5)
Eosinophils Relative: 2 %
HCT: 34.4 % — ABNORMAL LOW (ref 39.0–52.0)
Hemoglobin: 11 g/dL — ABNORMAL LOW (ref 13.0–17.0)
Immature Granulocytes: 0 %
Lymphocytes Relative: 40 %
Lymphs Abs: 1.8 10*3/uL (ref 0.7–4.0)
MCH: 28.4 pg (ref 26.0–34.0)
MCHC: 32 g/dL (ref 30.0–36.0)
MCV: 88.7 fL (ref 80.0–100.0)
Monocytes Absolute: 0.4 10*3/uL (ref 0.1–1.0)
Monocytes Relative: 9 %
Neutro Abs: 2.1 10*3/uL (ref 1.7–7.7)
Neutrophils Relative %: 48 %
Platelets: 162 10*3/uL (ref 150–400)
RBC: 3.88 MIL/uL — ABNORMAL LOW (ref 4.22–5.81)
RDW: 18.8 % — ABNORMAL HIGH (ref 11.5–15.5)
WBC: 4.5 10*3/uL (ref 4.0–10.5)
nRBC: 0 % (ref 0.0–0.2)

## 2019-10-30 MED ORDER — PROCHLORPERAZINE MALEATE 10 MG PO TABS: 10.0000 mg | ORAL_TABLET | Freq: Four times a day (QID) | ORAL | 0 refills | Status: DC | PRN

## 2019-10-30 MED ORDER — SODIUM CHLORIDE 0.9 % IV SOLN
Freq: Once | INTRAVENOUS | Status: AC
  Filled 2019-10-30: qty 250

## 2019-10-30 MED ORDER — POTASSIUM CHLORIDE CRYS ER 20 MEQ PO TBCR: 20.0000 meq | EXTENDED_RELEASE_TABLET | Freq: Two times a day (BID) | ORAL | 1 refills | Status: DC

## 2019-10-30 MED ORDER — LENALIDOMIDE 10 MG PO CAPS: 20.0000 mg | ORAL_CAPSULE | ORAL | 5 refills | Status: DC

## 2019-10-30 MED ORDER — SODIUM CHLORIDE 0.9 % IV SOLN
20.0000 meq | Freq: Once | INTRAVENOUS | Status: AC
  Administered 2019-10-30: 15:00:00 20 meq via INTRAVENOUS
  Filled 2019-10-30: qty 10

## 2019-10-30 NOTE — Progress Notes (Signed)
1430: Per Darlyn Chamber RN Per Dr. Grayland Ormond no Zometa, pt to receive 20 MEQs of Potassium at this time. *See MAR for order*

## 2019-10-31 ENCOUNTER — Telehealth: Payer: Self-pay

## 2019-10-31 LAB — IGG, IGA, IGM
IgA: 137 mg/dL (ref 61–437)
IgG (Immunoglobin G), Serum: 869 mg/dL (ref 603–1613)
IgM (Immunoglobulin M), Srm: 45 mg/dL (ref 15–143)

## 2019-10-31 LAB — KAPPA/LAMBDA LIGHT CHAINS
Kappa free light chain: 950.4 mg/L — ABNORMAL HIGH (ref 3.3–19.4)
Kappa, lambda light chain ratio: 59.4 — ABNORMAL HIGH (ref 0.26–1.65)
Lambda free light chains: 16 mg/L (ref 5.7–26.3)

## 2019-10-31 NOTE — Telephone Encounter (Signed)
Telephone call to schedule palliative care visit.  Patient in agreement with RN makin ghome visit 11/01/19 at 3:00 PM.

## 2019-11-01 ENCOUNTER — Other Ambulatory Visit: Payer: Medicare Other

## 2019-11-01 ENCOUNTER — Other Ambulatory Visit: Payer: Self-pay

## 2019-11-01 DIAGNOSIS — Z515 Encounter for palliative care: Secondary | ICD-10-CM

## 2019-11-01 DIAGNOSIS — R279 Unspecified lack of coordination: Secondary | ICD-10-CM | POA: Diagnosis not present

## 2019-11-01 DIAGNOSIS — M6281 Muscle weakness (generalized): Secondary | ICD-10-CM | POA: Diagnosis not present

## 2019-11-01 DIAGNOSIS — R2689 Other abnormalities of gait and mobility: Secondary | ICD-10-CM | POA: Diagnosis not present

## 2019-11-01 DIAGNOSIS — G20 Parkinson's disease: Secondary | ICD-10-CM | POA: Diagnosis not present

## 2019-11-01 NOTE — Progress Notes (Signed)
PATIENT NAME: Adam Moss DOB: 19-Apr-1934 MRN: [id number]  PRIMARY CARE PROVIDER: Perrin Maltese, MD  RESPONSIBLE PARTY:  Acct ID - Guarantor Home Phone Work Phone Relationship Acct Type  [id number] [name]  Self P/F     [address], [address], [place], [place] [address]    PLAN OF CARE and INTERVENTIONS:               1.  GOALS OF CARE/ ADVANCE CARE PLANNING: Remain in senior living apartment and continue to receive treatment for multiple myeloma.               2.  PATIENT/CAREGIVER EDUCATION:  Education on fall precautions, education on s/s of infection, support               3.  DISEASE STATUS: RN made scheduled palliative care visit. Patient sitting in his recliner chair. Physical therapist Apolonio Schneiders in home placing brace on patients right leg.  Apolonio Schneiders reports physical therapy appointment was changed until 3 p.m. today. Patient informed nurse yesterday he was to have physical therapy at 2 pm. Nurse completes assessment as physical therapist states she is in not in a rush. Patient denies having any pain at the present time. Patient saw Dr. Grayland [facility]rmond at the [facility] yesterday.  Patient remains on Revlimid 20 mg daily for 21 days then be off 7 days for treatment of multiple myeloma.  . Patient has resting tremors due to Parkinson's disease. Patient has lost 10 lbs in the last 4-6 weeks.  Patients current weight is 169 pounds. Patient right clavicle fracture has resolved and patient reports he will need no further treatments from Orthopedics. Patient received potassium supplementation at [facility] this week. Patients Zometia on hold until patient has dental procedure completed. Patient ambulates with his rollator walker and states he has been able to walk to the mailroom at facility twice a day. Patient complains of some discomfort in his stomach. Patient reports he sleeps during the day and is awake at night. Patient has TED hose in place and has tight non pitting edema in  lower extremities. Patient's breath sounds are clear. Patient has no visible open areas of skin breakdown. Patient remains in agreement with palliative care services. Patient encouraged to contact palliative care with questions or concerns.     HISTORY OF PRESENT ILLNESS:  Patient is a 86 year old patient who resides in senior living apartment.  Patient is followed by palliative care.  Patient is seen monthly and PRN   CODE STATUS: DNR  ADVANCED DIRECTIVES: Y MOST FORM: No PPS: 50%   PHYSICAL EXAM:   VITALS: Today's Vitals   11/01/19 1510  BP: 98/70  Pulse: 80  Resp: 18  Temp: 98.4 F (36.9 C)  TempSrc: Temporal  SpO2: 98%  Weight: 169 lb (76.7 kg)  PainSc: 2   PainLoc: Abdomen    LUNGS: clear to auscultation  CARDIAC: Cor RRR  EXTREMITIES: tight non pitting edema to LE's  SKIN: no visible open areas of skin breakdown  NEURO: positive for tremors and weakness       [name], RN

## 2019-11-06 DIAGNOSIS — G20 Parkinson's disease: Secondary | ICD-10-CM | POA: Diagnosis not present

## 2019-11-06 DIAGNOSIS — M6281 Muscle weakness (generalized): Secondary | ICD-10-CM | POA: Diagnosis not present

## 2019-11-06 DIAGNOSIS — R2689 Other abnormalities of gait and mobility: Secondary | ICD-10-CM | POA: Diagnosis not present

## 2019-11-06 DIAGNOSIS — R279 Unspecified lack of coordination: Secondary | ICD-10-CM | POA: Diagnosis not present

## 2019-11-08 ENCOUNTER — Ambulatory Visit (INDEPENDENT_AMBULATORY_CARE_PROVIDER_SITE_OTHER): Payer: Medicare Other | Admitting: Podiatry

## 2019-11-08 ENCOUNTER — Encounter: Payer: Self-pay | Admitting: Podiatry

## 2019-11-08 ENCOUNTER — Other Ambulatory Visit: Payer: Self-pay

## 2019-11-08 VITALS — Temp 97.4°F

## 2019-11-08 DIAGNOSIS — B351 Tinea unguium: Secondary | ICD-10-CM | POA: Diagnosis not present

## 2019-11-08 DIAGNOSIS — M79675 Pain in left toe(s): Secondary | ICD-10-CM

## 2019-11-08 DIAGNOSIS — I872 Venous insufficiency (chronic) (peripheral): Secondary | ICD-10-CM | POA: Diagnosis not present

## 2019-11-08 DIAGNOSIS — I890 Lymphedema, not elsewhere classified: Secondary | ICD-10-CM

## 2019-11-08 DIAGNOSIS — M79674 Pain in right toe(s): Secondary | ICD-10-CM | POA: Diagnosis not present

## 2019-11-08 NOTE — Progress Notes (Signed)
This patient returns to my office for at risk foot care.  This patient requires this care by a professional since this patient will be at risk due to having lymphedema and venous insufficiency. This patient is unable to cut nails himself since the patient cannot reach his nails.These nails are painful walking and wearing shoes.  This patient presents for at risk foot care today.  General Appearance  Alert, conversant and in no acute stress.  Vascular  Dorsalis pedis and posterior tibial  pulses are palpable  bilaterally.  Capillary return is within normal limits  bilaterally. Temperature is within normal limits  bilaterally.  Neurologic  Senn-Weinstein monofilament wire test within normal limits  bilaterally. Muscle power within normal limits bilaterally.  Nails Thick disfigured discolored nails with subungual debris  from hallux to fifth toes bilaterally. No evidence of bacterial infection or drainage bilaterally.  Orthopedic  No limitations of motion  feet .  No crepitus or effusions noted.  No bony pathology or digital deformities noted.  Skin  normotropic skin with no porokeratosis noted bilaterally.  No signs of infections or ulcers noted.     Onychomycosis  Pain in right toes  Pain in left toes  Consent was obtained for treatment procedures.   Mechanical debridement of nails 1-5  bilaterally performed with a nail nipper.  Filed with dremel without incident.    Return office visit   3 months                   Told patient to return for periodic foot care and evaluation due to potential at risk complications.   Gardiner Barefoot DPM

## 2019-11-09 DIAGNOSIS — R279 Unspecified lack of coordination: Secondary | ICD-10-CM | POA: Diagnosis not present

## 2019-11-09 DIAGNOSIS — M6281 Muscle weakness (generalized): Secondary | ICD-10-CM | POA: Diagnosis not present

## 2019-11-09 DIAGNOSIS — G20 Parkinson's disease: Secondary | ICD-10-CM | POA: Diagnosis not present

## 2019-11-09 DIAGNOSIS — R2689 Other abnormalities of gait and mobility: Secondary | ICD-10-CM | POA: Diagnosis not present

## 2019-11-12 DIAGNOSIS — E782 Mixed hyperlipidemia: Secondary | ICD-10-CM | POA: Diagnosis not present

## 2019-11-12 DIAGNOSIS — I340 Nonrheumatic mitral (valve) insufficiency: Secondary | ICD-10-CM | POA: Diagnosis not present

## 2019-11-12 DIAGNOSIS — R609 Edema, unspecified: Secondary | ICD-10-CM | POA: Diagnosis not present

## 2019-11-12 DIAGNOSIS — I509 Heart failure, unspecified: Secondary | ICD-10-CM | POA: Diagnosis not present

## 2019-11-12 DIAGNOSIS — I10 Essential (primary) hypertension: Secondary | ICD-10-CM | POA: Diagnosis not present

## 2019-11-12 DIAGNOSIS — R0602 Shortness of breath: Secondary | ICD-10-CM | POA: Diagnosis not present

## 2019-11-12 DIAGNOSIS — I351 Nonrheumatic aortic (valve) insufficiency: Secondary | ICD-10-CM | POA: Diagnosis not present

## 2019-11-13 DIAGNOSIS — G20 Parkinson's disease: Secondary | ICD-10-CM | POA: Diagnosis not present

## 2019-11-13 DIAGNOSIS — M6281 Muscle weakness (generalized): Secondary | ICD-10-CM | POA: Diagnosis not present

## 2019-11-13 DIAGNOSIS — R2689 Other abnormalities of gait and mobility: Secondary | ICD-10-CM | POA: Diagnosis not present

## 2019-11-13 DIAGNOSIS — R279 Unspecified lack of coordination: Secondary | ICD-10-CM | POA: Diagnosis not present

## 2019-11-14 ENCOUNTER — Other Ambulatory Visit: Payer: Self-pay

## 2019-11-14 ENCOUNTER — Ambulatory Visit (INDEPENDENT_AMBULATORY_CARE_PROVIDER_SITE_OTHER): Payer: Medicare Other | Admitting: Urology

## 2019-11-14 VITALS — BP 95/63 | HR 71 | Ht 70.0 in | Wt 168.0 lb

## 2019-11-14 DIAGNOSIS — N3281 Overactive bladder: Secondary | ICD-10-CM | POA: Diagnosis not present

## 2019-11-14 DIAGNOSIS — N400 Enlarged prostate without lower urinary tract symptoms: Secondary | ICD-10-CM

## 2019-11-14 LAB — BLADDER SCAN AMB NON-IMAGING: Scan Result: 5

## 2019-11-14 NOTE — Progress Notes (Signed)
   11/14/19 3:39 PM   Adam Moss Adam Moss May 06, 1934 [id number]  CC: Urinary symptoms  HPI: I saw [name] in urology clinic today for evaluation of urinary symptoms.  He is an extremely comorbid and frail-appearing 86 year old male with history of COPD, lymphedema, Parkinson's disease, and multiple myeloma.  He has a long history of BPH on maximal medical therapy for over 10 years with Flomax and finasteride.  He also takes Lasix 40 mg nightly.  He reports worsening difficulty with urination in terms of urgency, occasional urge incontinence, nocturia, and post void dribbling.  He denies any dysuria or gross hematuria.  Urine culture 09/10/2019 grew 20K Enterococcus, unclear if he was symptomatic at that time.  He denies any episodes of prior urinary retention.  Urinalysis is benign today with 0-5 WBCs, 0-2 RBCs, nitrite negative, few bacteria.  PVR is normal at 5 mL.  Prostate measures 47 g on recent CT from September 2020.  There is no nephrolithiasis or hydronephrosis.   PMH: Past Medical History:  Diagnosis Date  . Cancer ([place])    skin  . COPD (chronic obstructive pulmonary disease) ([place])   . Hyperlipemia   . Hypertension   . Lymphedema of both lower extremities   . Parkinson disease ([place])   . Parkinsonism ([place]) 02/21/2015  . Spinal stenosis   . Tremor     Surgical History: Past Surgical History:  Procedure Laterality Date  . APPENDECTOMY    . CATARACT EXTRACTION    . CHOLECYSTECTOMY    . TONSILLECTOMY    . VASECTOMY     Family History: Family History  Problem Relation Age of Onset  . Cancer Mother   . Heart disease Father     Social History:  reports that he has never smoked. He has never used smokeless tobacco. He reports that he does not drink alcohol or use drugs.  Physical Exam: BP 95/63   Pulse 71   Ht _0  (1.778 m)   Wt 168 lb (76.2 kg)   BMI 24.11 kg/m    Constitutional: Frail-appearing, no acute distress Abdomen soft, nontender GU: Mild meatal  stenosis, the meatus patent, no other lesions  Laboratory Data: Reviewed, see HPI  Pertinent Imaging: I have personally reviewed the CT, prostate measured 47 g.  Assessment & Plan:   In summary, he is an 86 year old extremely comorbid male with worsening urinary symptoms despite maximal medical therapy with Flomax and finasteride.  He has a number of comorbidities that likely are contributing to his urinary symptoms including Parkinson's, frailty with difficulty making it to the restroom, and Lasix that he takes in the evening.  We discussed these relationships at length.  We also discussed the overlap between BPH and overactive bladder.  He is emptying his bladder well today.  I recommended a trial of Myrbetriq 50 mg daily to see if this improves his urinary symptoms.  If he has no improvement on the Myrbetriq, could consider a clinic dilation of the meatus to see if this improves some of his stream and postvoid dribbling.  Unfortunately, with his numerous comorbidities and Lasix, I think we need to set realistic expectations regarding his urinary symptoms.  Trial of Myrbetriq 50 mg daily RTC 4 weeks for symptom check and PVR  Adam Madrid, MD 11/14/2019  [facility] [address] [place], [place] [address] [phone]

## 2019-11-15 LAB — URINALYSIS, COMPLETE
Bilirubin, UA: NEGATIVE
Glucose, UA: NEGATIVE
Leukocytes,UA: NEGATIVE
Nitrite, UA: NEGATIVE
Specific Gravity, UA: >1.03 — ABNORMAL HIGH (ref 1.005–1.030)
Urobilinogen, Ur: 1 mg/dL (ref 0.2–1.0)
pH, UA: 5 (ref 5.0–7.5)

## 2019-11-15 LAB — MICROSCOPIC EXAMINATION

## 2019-11-20 DIAGNOSIS — R279 Unspecified lack of coordination: Secondary | ICD-10-CM | POA: Diagnosis not present

## 2019-11-20 DIAGNOSIS — R2689 Other abnormalities of gait and mobility: Secondary | ICD-10-CM | POA: Diagnosis not present

## 2019-11-20 DIAGNOSIS — G20 Parkinson's disease: Secondary | ICD-10-CM | POA: Diagnosis not present

## 2019-11-20 DIAGNOSIS — M6281 Muscle weakness (generalized): Secondary | ICD-10-CM | POA: Diagnosis not present

## 2019-11-21 ENCOUNTER — Other Ambulatory Visit: Payer: Self-pay | Admitting: Oncology

## 2019-11-21 NOTE — Telephone Encounter (Signed)
...    Ref Range & Units 3 wk ago  Potassium 3.5 - 5.1 mmol/L 2.2Low Panic    Comment: RESULTS VERIFIED BY REPEAT TESTING  CRITICAL RESULT CALLED TO, READ BACK BY AND VERIFIED WITH [facility] OLEJAR AT 13:44 10/30/2019 K[facility]

## 2019-11-23 DIAGNOSIS — R279 Unspecified lack of coordination: Secondary | ICD-10-CM | POA: Diagnosis not present

## 2019-11-23 DIAGNOSIS — R2689 Other abnormalities of gait and mobility: Secondary | ICD-10-CM | POA: Diagnosis not present

## 2019-11-23 DIAGNOSIS — G20 Parkinson's disease: Secondary | ICD-10-CM | POA: Diagnosis not present

## 2019-11-23 DIAGNOSIS — M6281 Muscle weakness (generalized): Secondary | ICD-10-CM | POA: Diagnosis not present

## 2019-11-24 NOTE — Progress Notes (Signed)
[facility]  [phone] [phone]  ID: Adam Moss OB: Nov 23, 1933  MR#: [id number]  QMV#:[id number]  Patient Care Team: Perrin Maltese, MD as PCP - General (Internal Medicine) Lloyd Huger, MD as Consulting Physician (Hematology and Oncology)   CHIEF COMPLAINT: Kappa light chain myeloma.   INTERVAL HISTORY: Patient returns to clinic today for repeat laboratory work, further evaluation, and continuation of treatment.  He did not initiate his higher dose of Revlimid secondary to being concerned of changing too many medications at 1 time.  His cardiologist and nephrologist have also recently changed regimens.  He continues to have increased weakness and fatigue.  He has a resting tremor secondary to his Parkinson's, but no other neurologic complaints.  He denies any recent fevers or illnesses.  He has no chest pain, shortness of breath, cough, or hemoptysis.  He denies any nausea, vomiting, constipation, or diarrhea.  He has no urinary complaints.  Patient offers no further specific complaints today.  REVIEW OF SYSTEMS:   Review of Systems  Constitutional: Positive for malaise/fatigue. Negative for fever and weight loss.  Respiratory: Negative.  Negative for cough, hemoptysis and shortness of breath.   Cardiovascular: Negative.  Negative for chest pain and leg swelling.  Gastrointestinal: Negative.  Negative for abdominal pain and constipation.  Genitourinary: Negative.  Negative for dysuria.  Musculoskeletal: Negative.  Negative for back pain.  Skin: Negative.  Negative for rash.  Neurological: Positive for tremors and weakness. Negative for dizziness, focal weakness and headaches.  Psychiatric/Behavioral: Negative.  The patient is not nervous/anxious.     As per HPI. Otherwise, a complete review of systems is negative.  PAST MEDICAL HISTORY: Past Medical History:  Diagnosis Date  . Cancer ([place])    skin  . COPD (chronic obstructive  pulmonary disease) ([place])   . Hyperlipemia   . Hypertension   . Lymphedema of both lower extremities   . Parkinson disease ([place])   . Parkinsonism ([place]) 02/21/2015  . Spinal stenosis   . Tremor     PAST SURGICAL HISTORY: Past Surgical History:  Procedure Laterality Date  . APPENDECTOMY    . CATARACT EXTRACTION    . CHOLECYSTECTOMY    . TONSILLECTOMY    . VASECTOMY      FAMILY HISTORY: Family History  Problem Relation Age of Onset  . Cancer Mother   . Heart disease Father     ADVANCED DIRECTIVES (Y/N):  N  HEALTH MAINTENANCE: Social History   Tobacco Use  . Smoking status: Never Smoker  . Smokeless tobacco: Never Used  Substance Use Topics  . Alcohol use: No    Alcohol/week: 0.0 standard drinks  . Drug use: No     Colonoscopy:  PAP:  Bone density:  Lipid panel:  Allergies  Allergen Reactions  . Carbidopa-Levodopa Other (See Comments)    Severe stomach pains, can take rytary  . Oxycodone-Acetaminophen Other (See Comments)    "boils on skin"  . Tyloxapol   . Acetaminophen Rash, Nausea And Vomiting and Hives  . Pramipexole Nausea Only    Current Outpatient Medications  Medication Sig Dispense Refill  . aspirin 81 MG tablet Take 81 mg by mouth daily.    . Calcium Citrate 250 MG TABS Take 1 tablet by mouth daily.     . Carbidopa-Levodopa ER (RYTARY) 48.75-195 MG CPCR Take 195 mg by mouth 5 (five) times daily. 450 capsule 3  . cetirizine (ZYRTEC) 10 MG tablet Take 10 mg by mouth daily.    Marland Kitchen  enalapril (VASOTEC) 2.5 MG tablet Take 2.5 mg by mouth daily.    . finasteride (PROSCAR) 5 MG tablet Take 5 mg by mouth daily.    . fluticasone (FLONASE) 50 MCG/ACT nasal spray Place into both nostrils daily.    . Fluticasone-Umeclidin-Vilant (TRELEGY ELLIPTA) 100-62.5-25 MCG/INH AEPB Trelegy Ellipta 100-62.5-25 MCG/INH Inhalation Aerosol Powder Breath Activated QTY: 3 inhaler Days: 90 Refills: 3  Written: 08/02/19 Patient Instructions: inhale 1 puff by mouth once daily.  Rinse mouth well after use.    . furosemide (LASIX) 20 MG tablet Take 40 mg by mouth daily.     Marland Kitchen ipratropium (ATROVENT) 0.03 % nasal spray Place 0.03 mLs into both nostrils 3 (three) times daily as needed.  11  . KLOR-CON M20 20 MEQ tablet TAKE 1 TABLET BY MOUTH TWICE A DAY 60 tablet 1  . meloxicam (MOBIC) 15 MG tablet Take 1 tablet (15 mg total) by mouth daily as needed for pain.    . niacin 500 MG tablet Take 500 mg by mouth every morning.     Marland Kitchen omeprazole (PRILOSEC) 40 MG capsule Take 40 mg by mouth daily.    . prochlorperazine (COMPAZINE) 10 MG tablet Take 1 tablet (10 mg total) by mouth every 6 (six) hours as needed for nausea or vomiting. 30 tablet 0  . rosuvastatin (CRESTOR) 20 MG tablet Take 20 mg by mouth at bedtime.    . Simethicone (GAS RELIEF PO) Take 1 Dose by mouth as needed.     Marland Kitchen spironolactone (ALDACTONE) 25 MG tablet Take 25 mg by mouth daily.    . tamsulosin (FLOMAX) 0.4 MG CAPS capsule Take 0.4 mg by mouth daily.     . vitamin B-12 (CYANOCOBALAMIN) 1000 MCG tablet Take 1,000 mcg by mouth daily.    . Calcium Carbonate-Vitamin D (OYSTER SHELL CALCIUM/D) 250-125 MG-UNIT TABS Take by mouth.    . Ferrous Sulfate (IRON) 28 MG TABS Take 1 tablet (28 mg total) by mouth daily. Or can take any other iron supplement. (Patient not taking: Reported on 11/26/2019)    . fluticasone furoate-vilanterol (BREO ELLIPTA) 100-25 MCG/INH AEPB USE 1 INHALATION ORALLY    DAILY (Patient not taking: Reported on 11/26/2019) 180 each 3  . lenalidomide (REVLIMID) 10 MG capsule Take 2 capsules (20 mg total) by mouth as directed. Take 2 capsules daily for 21 days then 7 days off 42 capsule 5  . sertraline (ZOLOFT) 25 MG tablet Take 25 mg by mouth daily.    . vitamin C (ASCORBIC ACID) 500 MG tablet Take 500 mg by mouth daily.     No current facility-administered medications for this visit.   Facility-Administered Medications Ordered in Other Visits  Medication Dose Route Frequency Provider Last Rate Last  Admin  . potassium chloride 20 mEq in sodium chloride 0.9 % 260 mL infusion - Peripheral Line  20 mEq Intravenous Once Lloyd Huger, MD 130 mL/hr at 11/27/19 1215 20 mEq at 11/27/19 1215    OBJECTIVE: Vitals:   11/27/19 1055  BP: 112/83  Pulse: 83  Resp: 18  Temp: (!) 96.6 F (35.9 C)  SpO2: 100%     Body mass index is 23.78 kg/m.    ECOG FS:1 - Symptomatic but completely ambulatory  General: Well-developed, well-nourished, no acute distress.  Sitting in a wheelchair. Eyes: Pink conjunctiva, anicteric sclera. HEENT: Normocephalic, moist mucous membranes. Lungs: No audible wheezing or coughing. Heart: Regular rate and rhythm. Abdomen: Soft, nontender, no obvious distention. Musculoskeletal: No edema, cyanosis, or clubbing. Neuro: Alert,  answering all questions appropriately. Cranial nerves grossly intact.  Resting tremor in left arm noted. Skin: No rashes or petechiae noted. Psych: Normal affect.   LAB RESULTS:  Lab Results  Component Value Date   NA 134 (L) 11/27/2019   K 3.3 (L) 11/27/2019   CL 95 (L) 11/27/2019   CO2 28 11/27/2019   GLUCOSE 128 (H) 11/27/2019   BUN 19 11/27/2019   CREATININE 1.18 11/27/2019   CALCIUM 8.9 11/27/2019   PROT 6.7 11/27/2019   ALBUMIN 3.7 11/27/2019   AST 39 11/27/2019   ALT 10 11/27/2019   ALKPHOS 90 11/27/2019   BILITOT 0.8 11/27/2019   GFRNONAA 56 (L) 11/27/2019   GFRAA >60 11/27/2019    Lab Results  Component Value Date   WBC 4.6 11/27/2019   NEUTROABS 1.8 11/27/2019   HGB 10.9 (L) 11/27/2019   HCT 33.9 (L) 11/27/2019   MCV 90.9 11/27/2019   PLT 114 (L) 11/27/2019     STUDIES: No results found.  ASSESSMENT: Kappa light chain myeloma.  PLAN:    1. Kappa light chain myeloma: Bone marrow biopsy on May 01, 2019 revealed increased plasma cells of 30 to 40% with kappa light chain restriction.  SPEP is negative and IgG, IgA, and IgM are essentially within normal limits.  Patient's kappa free light chain as well  as kappa/lambda light chain ratio continue to trend up and are now 950.4 and 59.4 respectively. He did not initiate 20 mg Revlimid as recommended secondary to changes in other medications by his cardiologist and nephrologist.  Patient initiated low-dose 10 mg Revlimid daily at the beginning of November 2020.  Plan was to change Revlimid dosing to 20 mg daily for 21 days with 7 days off, but will continue current dose of 10 mg daily until his other medicine regimens have stabilized. Continue to hold Zometa until patient has dental procedure which was rescheduled for next week.  Return to clinic in 4 weeks for further evaluation and continuation of treatment.   2.  Right clavicle fracture: Pathologic.  Resolved.  Patient reports no further interventions are needed by orthopedics. 3.  Hypocalcemia: Resolved.  Calcium is 8.9 today. 4.  Iron deficiency anemia: Chronic and unchanged.  Hemoglobin is 10.9. 5.  Thrombocytopenia: Mild.  Platelet count is 114 today. 6.  Leukopenia: Resolved. 7.  Constipation: Patient has been instructed to use MiraLAX more regularly. 8.  Hypokalemia: Significantly improved, but patient admits he has difficulty taking oral supplementation.  Proceed with 20 mEq IV potassium today.   9.  Dental pain: Hold Zometa until after patient's dental procedure.  Patient expressed understanding and was in agreement with this plan. He also understands that He can call clinic at any time with any questions, concerns, or complaints.    Lloyd Huger, MD   11/27/2019 12:36 PM

## 2019-11-26 ENCOUNTER — Encounter: Payer: Self-pay | Admitting: Oncology

## 2019-11-26 NOTE — Progress Notes (Signed)
Patient states he has not been doing well. He recently had have a bad drug reaction. He doesn't remember name of medications they were prescribed by cardiologist and urologist. Reports some swelling of ankles and legs. Patient also reports he may possibly have some teeth pulled and will be seeing a dentist next week. He would like to post pone his treatment if possible.    Patient brought medication information in today for appointment. Medications causing reactions were myrbetriq and enalapril 2.5 mg tab. Patient states he has stopped taking and will discuss medications with providers in next week during appointments.

## 2019-11-27 ENCOUNTER — Inpatient Hospital Stay (HOSPITAL_BASED_OUTPATIENT_CLINIC_OR_DEPARTMENT_OTHER): Payer: Medicare Other | Admitting: Oncology

## 2019-11-27 ENCOUNTER — Inpatient Hospital Stay: Payer: Medicare Other

## 2019-11-27 ENCOUNTER — Encounter: Payer: Self-pay | Admitting: Oncology

## 2019-11-27 ENCOUNTER — Inpatient Hospital Stay: Payer: Medicare Other | Attending: Oncology

## 2019-11-27 ENCOUNTER — Other Ambulatory Visit: Payer: Self-pay | Admitting: Emergency Medicine

## 2019-11-27 ENCOUNTER — Other Ambulatory Visit: Payer: Self-pay

## 2019-11-27 VITALS — BP 112/83 | HR 83 | Temp 96.6°F | Resp 18 | Wt 165.7 lb

## 2019-11-27 DIAGNOSIS — K0889 Other specified disorders of teeth and supporting structures: Secondary | ICD-10-CM | POA: Insufficient documentation

## 2019-11-27 DIAGNOSIS — C9000 Multiple myeloma not having achieved remission: Secondary | ICD-10-CM

## 2019-11-27 DIAGNOSIS — E876 Hypokalemia: Secondary | ICD-10-CM | POA: Insufficient documentation

## 2019-11-27 DIAGNOSIS — D696 Thrombocytopenia, unspecified: Secondary | ICD-10-CM | POA: Insufficient documentation

## 2019-11-27 DIAGNOSIS — Z809 Family history of malignant neoplasm, unspecified: Secondary | ICD-10-CM | POA: Insufficient documentation

## 2019-11-27 DIAGNOSIS — D509 Iron deficiency anemia, unspecified: Secondary | ICD-10-CM | POA: Insufficient documentation

## 2019-11-27 DIAGNOSIS — K5900 Constipation, unspecified: Secondary | ICD-10-CM | POA: Insufficient documentation

## 2019-11-27 LAB — CBC WITH DIFFERENTIAL/PLATELET
Abs Immature Granulocytes: 0.03 10*3/uL (ref 0.00–0.07)
Basophils Absolute: 0 10*3/uL (ref 0.0–0.1)
Basophils Relative: 1 %
Eosinophils Absolute: 0.2 10*3/uL (ref 0.0–0.5)
Eosinophils Relative: 5 %
HCT: 33.9 % — ABNORMAL LOW (ref 39.0–52.0)
Hemoglobin: 10.9 g/dL — ABNORMAL LOW (ref 13.0–17.0)
Immature Granulocytes: 1 %
Lymphocytes Relative: 46 %
Lymphs Abs: 2.2 10*3/uL (ref 0.7–4.0)
MCH: 29.2 pg (ref 26.0–34.0)
MCHC: 32.2 g/dL (ref 30.0–36.0)
MCV: 90.9 fL (ref 80.0–100.0)
Monocytes Absolute: 0.4 10*3/uL (ref 0.1–1.0)
Monocytes Relative: 8 %
Neutro Abs: 1.8 10*3/uL (ref 1.7–7.7)
Neutrophils Relative %: 39 %
Platelets: 114 10*3/uL — ABNORMAL LOW (ref 150–400)
RBC: 3.73 MIL/uL — ABNORMAL LOW (ref 4.22–5.81)
RDW: 20.5 % — ABNORMAL HIGH (ref 11.5–15.5)
WBC: 4.6 10*3/uL (ref 4.0–10.5)
nRBC: 0 % (ref 0.0–0.2)

## 2019-11-27 LAB — COMPREHENSIVE METABOLIC PANEL
ALT: 10 U/L (ref 0–44)
AST: 39 U/L (ref 15–41)
Albumin: 3.7 g/dL (ref 3.5–5.0)
Alkaline Phosphatase: 90 U/L (ref 38–126)
Anion gap: 11 (ref 5–15)
BUN: 19 mg/dL (ref 8–23)
CO2: 28 mmol/L (ref 22–32)
Calcium: 8.9 mg/dL (ref 8.9–10.3)
Chloride: 95 mmol/L — ABNORMAL LOW (ref 98–111)
Creatinine, Ser: 1.18 mg/dL (ref 0.61–1.24)
GFR calc Af Amer: >60 mL/min (ref >60)
GFR calc non Af Amer: 56 mL/min — ABNORMAL LOW (ref >60)
Glucose, Bld: 128 mg/dL — ABNORMAL HIGH (ref 70–99)
Potassium: 3.3 mmol/L — ABNORMAL LOW (ref 3.5–5.1)
Sodium: 134 mmol/L — ABNORMAL LOW (ref 135–145)
Total Bilirubin: 0.8 mg/dL (ref 0.3–1.2)
Total Protein: 6.7 g/dL (ref 6.5–8.1)

## 2019-11-27 MED ORDER — SODIUM CHLORIDE 0.9 % IV SOLN
20.0000 meq | Freq: Once | INTRAVENOUS | Status: AC
  Administered 2019-11-27: 20 meq via INTRAVENOUS
  Filled 2019-11-27: qty 10

## 2019-11-27 MED ORDER — SODIUM CHLORIDE 0.9 % IV SOLN
Freq: Once | INTRAVENOUS | Status: AC
  Filled 2019-11-27: qty 250

## 2019-11-27 MED ORDER — LENALIDOMIDE 10 MG PO CAPS: 20.0000 mg | ORAL_CAPSULE | ORAL | 5 refills | Status: DC

## 2019-11-28 ENCOUNTER — Other Ambulatory Visit: Payer: Medicare Other

## 2019-11-28 DIAGNOSIS — Z515 Encounter for palliative care: Secondary | ICD-10-CM

## 2019-11-28 LAB — IGG, IGA, IGM
IgA: 115 mg/dL (ref 61–437)
IgG (Immunoglobin G), Serum: 700 mg/dL (ref 603–1613)
IgM (Immunoglobulin M), Srm: 37 mg/dL (ref 15–143)

## 2019-11-28 LAB — KAPPA/LAMBDA LIGHT CHAINS
Kappa free light chain: 805.8 mg/L — ABNORMAL HIGH (ref 3.3–19.4)
Kappa, lambda light chain ratio: 55.57 — ABNORMAL HIGH (ref 0.26–1.65)
Lambda free light chains: 14.5 mg/L (ref 5.7–26.3)

## 2019-11-28 NOTE — Progress Notes (Signed)
COMMUNITY PALLIATIVE CARE SW NOTE  PATIENT NAME: Adam Moss  DOB: [birth year] MRN: [id number]  PRIMARY CARE PROVIDER: Perrin Maltese, MD  RESPONSIBLE PARTY:  Acct ID - Guarantor Home Phone Work Phone Relationship Acct Type  [id number] [name]  Self P/F     [address], [address], [place], [place] [address]     PLAN OF CARE and INTERVENTIONS:             1. GOALS OF CARE/ ADVANCE CARE PLANNING:  Patient is DNR.Facility keeps the copy.HCPOA is [name] (patient's son). Living Will is complete. Goal is to continue gaining strength and continue to be independent.  2. SOCIAL/EMOTIONAL/SPIRITUAL ASSESSMENT/ INTERVENTIONS:  SW met with patient in his apartment. Patient denies pain today. Patient said his endurance has increased, feels a little stronger. Patient denies falls. Patient is able to walk to dining room, mail room, etc. Patient tries to increase fluid intake. Patient said his appetite is fair, and he tries to eat snacks. Patient reports that he is not sleeping well. Patient said he stays awake until around 4:00AM and sleeps until midday. Patient also naps in the afternoons. Discussed sleeping patterns and relaxation techniques. Patient denies concern for his sleep schedule and said that most of the other residents have similar schedules. SW reviewed goals and care plan, used active and reflective listening, andprovided emotional support. 3. PATIENT/CAREGIVER EDUCATION/ COPING:  Patient has flat affect. Patient is alert and oriented. Patient answers questions when asked and openly expressed feelings and concerns. Patient acknowledged his depressed mood.  Discussed coping techniques. Patient feels frustrated with his medical challenges and lack of coordination between his physicians. SW reviewed discharge paperwork from recent infusion appointment. Reviewed medications. SW encouraged patient to take the discharge paperwork to next appointment. 4. PERSONAL EMERGENCY PLAN:   Facility to follow protocol. 5. COMMUNITY RESOURCES COORDINATION/ HEALTH CARE NAVIGATION:  Patient is doing PT/OT twice a week. Patient also has an aide coming twice a week for standby with showers. Patient has stopped the infusions at this time. Patient is scheduled to see a dentist next week, and believes that he will have a few teeth pulled and this is why they have paused the infusions. Plans to resume infusions following dental work. Patient is scheduled to see cardiology on 5/10 and urology on 5/19. Patient uses [facility] transportation.  6. FINANCIAL/LEGAL CONCERNS/INTERVENTIONS:  None.     SOCIAL HX:  Social History   Tobacco Use  . Smoking status: Never Smoker  . Smokeless tobacco: Never Used  Substance Use Topics  . Alcohol use: No    Alcohol/week: 0.0 standard drinks    CODE STATUS:DNR ADVANCED DIRECTIVES: Y MOST FORM COMPLETE: No. HOSPICE EDUCATION PROVIDED: None.  PPS: Patient is mostly independent of ADLs, needs standby for bathing and dressing. Patientcontinuesto ambulate with his walker, doing well.  I spent52mnutes with patient/family, [id number] education, support and consultation.   WMargaretmary Lombard LCSW

## 2019-11-30 ENCOUNTER — Other Ambulatory Visit: Payer: Self-pay | Admitting: *Deleted

## 2019-11-30 DIAGNOSIS — C9000 Multiple myeloma not having achieved remission: Secondary | ICD-10-CM

## 2019-11-30 DIAGNOSIS — R2689 Other abnormalities of gait and mobility: Secondary | ICD-10-CM | POA: Diagnosis not present

## 2019-11-30 DIAGNOSIS — G20 Parkinson's disease: Secondary | ICD-10-CM | POA: Diagnosis not present

## 2019-11-30 DIAGNOSIS — M6281 Muscle weakness (generalized): Secondary | ICD-10-CM | POA: Diagnosis not present

## 2019-11-30 MED ORDER — LENALIDOMIDE 10 MG PO CAPS: 20.0000 mg | ORAL_CAPSULE | ORAL | 5 refills | Status: DC

## 2019-12-03 DIAGNOSIS — I10 Essential (primary) hypertension: Secondary | ICD-10-CM | POA: Diagnosis not present

## 2019-12-03 DIAGNOSIS — R0602 Shortness of breath: Secondary | ICD-10-CM | POA: Diagnosis not present

## 2019-12-03 DIAGNOSIS — J449 Chronic obstructive pulmonary disease, unspecified: Secondary | ICD-10-CM | POA: Diagnosis not present

## 2019-12-03 DIAGNOSIS — R609 Edema, unspecified: Secondary | ICD-10-CM | POA: Diagnosis not present

## 2019-12-03 DIAGNOSIS — E782 Mixed hyperlipidemia: Secondary | ICD-10-CM | POA: Diagnosis not present

## 2019-12-04 DIAGNOSIS — G20 Parkinson's disease: Secondary | ICD-10-CM | POA: Diagnosis not present

## 2019-12-04 DIAGNOSIS — M6281 Muscle weakness (generalized): Secondary | ICD-10-CM | POA: Diagnosis not present

## 2019-12-04 DIAGNOSIS — R2689 Other abnormalities of gait and mobility: Secondary | ICD-10-CM | POA: Diagnosis not present

## 2019-12-05 ENCOUNTER — Other Ambulatory Visit: Payer: Self-pay | Admitting: Pharmacist

## 2019-12-05 DIAGNOSIS — C9000 Multiple myeloma not having achieved remission: Secondary | ICD-10-CM

## 2019-12-05 MED ORDER — LENALIDOMIDE 20 MG PO CAPS: 20.0000 mg | ORAL_CAPSULE | Freq: Every day | ORAL | 0 refills | Status: DC

## 2019-12-05 NOTE — Progress Notes (Signed)
Oral Chemotherapy Pharmacist Encounter   Prescription sent for Revlimid for two 10mg  tablets daily, #42. Bethena Roys, patient avocate, received a call from Mr. Rokusek stating his insurance will not cover this quantity. Prescription changed to one 20mg  tablet daily #21 and resent to [facility].   Darl Pikes, PharmD, BCPS, BCOP, CPP Hematology/Oncology Clinical Pharmacist [facility] [phone]  12/05/2019 10:54 AM

## 2019-12-06 ENCOUNTER — Other Ambulatory Visit: Payer: Self-pay | Admitting: Oncology

## 2019-12-07 DIAGNOSIS — M6281 Muscle weakness (generalized): Secondary | ICD-10-CM | POA: Diagnosis not present

## 2019-12-07 DIAGNOSIS — R2689 Other abnormalities of gait and mobility: Secondary | ICD-10-CM | POA: Diagnosis not present

## 2019-12-07 DIAGNOSIS — G20 Parkinson's disease: Secondary | ICD-10-CM | POA: Diagnosis not present

## 2019-12-11 DIAGNOSIS — R2689 Other abnormalities of gait and mobility: Secondary | ICD-10-CM | POA: Diagnosis not present

## 2019-12-11 DIAGNOSIS — M6281 Muscle weakness (generalized): Secondary | ICD-10-CM | POA: Diagnosis not present

## 2019-12-11 DIAGNOSIS — G20 Parkinson's disease: Secondary | ICD-10-CM | POA: Diagnosis not present

## 2019-12-12 ENCOUNTER — Ambulatory Visit (INDEPENDENT_AMBULATORY_CARE_PROVIDER_SITE_OTHER): Payer: Medicare Other | Admitting: Urology

## 2019-12-12 ENCOUNTER — Other Ambulatory Visit: Payer: Self-pay

## 2019-12-12 ENCOUNTER — Encounter: Payer: Self-pay | Admitting: Urology

## 2019-12-12 VITALS — BP 107/73 | HR 90 | Ht 70.0 in | Wt 167.0 lb

## 2019-12-12 DIAGNOSIS — N3281 Overactive bladder: Secondary | ICD-10-CM | POA: Diagnosis not present

## 2019-12-12 DIAGNOSIS — N400 Enlarged prostate without lower urinary tract symptoms: Secondary | ICD-10-CM | POA: Diagnosis not present

## 2019-12-12 LAB — BLADDER SCAN AMB NON-IMAGING: Scan Result: 28

## 2019-12-12 NOTE — Progress Notes (Signed)
   12/12/2019 4:13 PM   Adam Moss 09-Jan-1934 [id number]  Reason for visit: Follow up urinary symptoms  HPI: I saw [name] back in urology clinic for urinary symptoms.  He is an 86 year old extremely comorbid and frail-appearing male with medical history notable for COPD, lymphedema, Parkinson's disease, and multiple myeloma.  I originally saw him in April 2021 for worsening urinary symptoms with urgency, occasional urge incontinence, nocturia, and post void dribbling.  PVR was normal at that visit.  We trialed him on Myrbetriq over the last month but he has not noticed any significant improvement.  Notably, he appears even more frail today than he did just 1 month ago. He has been on maximal medical therapy with Flomax and finasteride long-term, and prostate measures 47 g on CT from September 2020.  On exam, at our last visit he did have mild meatal stenosis, and I recommended performing a dilation in clinic today to see if this helps with some of his stream issues and post void dribbling.  He was prepped with Betadine, lidocaine was injected into the meatus, and a curved hemostat was used to gently dilate the meatus.  I had a very frank conversation with the patient that with his comorbidities, diuretics, and frailty I think we need to have realistic expectations about his urinary symptoms.  I think he is starting to understand this, as I think even he can appreciate that his health has significantly declined over the last few months.  He is not a candidate for anticholinergics with his age, frailty, and Parkinson's disease.  Behavioral strategies were discussed at length.  Continue Flomax and finasteride RTC 6 months with PVR  I spent 30 total minutes on the day of the encounter including pre-visit review of the medical record, face-to-face time with the patient, and post visit ordering of labs/imaging/tests.  Billey Co, [facility] [address], [place], [place] [address] [phone]

## 2019-12-14 DIAGNOSIS — R2689 Other abnormalities of gait and mobility: Secondary | ICD-10-CM | POA: Diagnosis not present

## 2019-12-14 DIAGNOSIS — G20 Parkinson's disease: Secondary | ICD-10-CM | POA: Diagnosis not present

## 2019-12-14 DIAGNOSIS — M6281 Muscle weakness (generalized): Secondary | ICD-10-CM | POA: Diagnosis not present

## 2019-12-21 DIAGNOSIS — R2689 Other abnormalities of gait and mobility: Secondary | ICD-10-CM | POA: Diagnosis not present

## 2019-12-21 DIAGNOSIS — M6281 Muscle weakness (generalized): Secondary | ICD-10-CM | POA: Diagnosis not present

## 2019-12-21 DIAGNOSIS — G20 Parkinson's disease: Secondary | ICD-10-CM | POA: Diagnosis not present

## 2019-12-21 NOTE — Progress Notes (Signed)
[facility]  [phone]) [phone] Fax:(336) [phone]  ID: Adam Moss OB: 1933/11/20  MR#: [id number]  BSJ#:[id number]  Patient Care Team: Perrin Maltese, MD as PCP - General (Internal Medicine) Lloyd Huger, MD as Consulting Physician (Hematology and Oncology)   CHIEF COMPLAINT: Kappa light chain myeloma.   INTERVAL HISTORY: Patient returns to clinic today for repeat laboratory, further evaluation, and continuation of Revlimid.  He currently takes 20 mg daily for 21 days with 7 days off.  He has chronic weakness and fatigue. He has a resting tremor secondary to his Parkinson's, but no other neurologic complaints.  He denies any recent fevers or illnesses.  He has no chest pain, shortness of breath, cough, or hemoptysis.  He denies any nausea, vomiting, constipation, or diarrhea.  He has no urinary complaints.  Patient offers no further specific complaints.  REVIEW OF SYSTEMS:   Review of Systems  Constitutional: Positive for malaise/fatigue. Negative for fever and weight loss.  Respiratory: Negative.  Negative for cough, hemoptysis and shortness of breath.   Cardiovascular: Negative.  Negative for chest pain and leg swelling.  Gastrointestinal: Negative.  Negative for abdominal pain and constipation.  Genitourinary: Negative.  Negative for dysuria.  Musculoskeletal: Negative.  Negative for back pain.  Skin: Negative.  Negative for rash.  Neurological: Positive for tremors and weakness. Negative for dizziness, focal weakness and headaches.  Psychiatric/Behavioral: Negative.  The patient is not nervous/anxious.     As per HPI. Otherwise, a complete review of systems is negative.  PAST MEDICAL HISTORY: Past Medical History:  Diagnosis Date  . Cancer ([place])    skin  . COPD (chronic obstructive pulmonary disease) ([place])   . Hyperlipemia   . Hypertension   . Lymphedema of both lower extremities   . Parkinson disease ([place])   . Parkinsonism ([place])  02/21/2015  . Spinal stenosis   . Tremor     PAST SURGICAL HISTORY: Past Surgical History:  Procedure Laterality Date  . APPENDECTOMY    . CATARACT EXTRACTION    . CHOLECYSTECTOMY    . TONSILLECTOMY    . VASECTOMY      FAMILY HISTORY: Family History  Problem Relation Age of Onset  . Cancer Mother   . Heart disease Father     ADVANCED DIRECTIVES (Y/N):  N  HEALTH MAINTENANCE: Social History   Tobacco Use  . Smoking status: Never Smoker  . Smokeless tobacco: Never Used  Substance Use Topics  . Alcohol use: No    Alcohol/week: 0.0 standard drinks  . Drug use: No     Colonoscopy:  PAP:  Bone density:  Lipid panel:  Allergies  Allergen Reactions  . Carbidopa-Levodopa Other (See Comments)    Severe stomach pains, can take rytary  . Oxycodone-Acetaminophen Other (See Comments)    "boils on skin"  . Tyloxapol   . Acetaminophen Rash, Nausea And Vomiting and Hives  . Pramipexole Nausea Only    Current Outpatient Medications  Medication Sig Dispense Refill  . aspirin 81 MG tablet Take 81 mg by mouth daily.    . Calcium Carbonate-Vitamin D (OYSTER SHELL CALCIUM/D) 250-125 MG-UNIT TABS Take by mouth.    . Calcium Citrate 250 MG TABS Take 1 tablet by mouth daily.     . Carbidopa-Levodopa ER (RYTARY) 48.75-195 MG CPCR Take 195 mg by mouth 5 (five) times daily. 450 capsule 3  . cetirizine (ZYRTEC) 10 MG tablet Take 10 mg by mouth daily.    . enalapril (VASOTEC) 2.5 MG tablet  Take 2.5 mg by mouth daily.    . finasteride (PROSCAR) 5 MG tablet Take 5 mg by mouth daily.    . fluticasone (FLONASE) 50 MCG/ACT nasal spray Place into both nostrils daily.    . Fluticasone-Umeclidin-Vilant (TRELEGY ELLIPTA) 100-62.5-25 MCG/INH AEPB Trelegy Ellipta 100-62.5-25 MCG/INH Inhalation Aerosol Powder Breath Activated QTY: 3 inhaler Days: 90 Refills: 3  Written: 08/02/19 Patient Instructions: inhale 1 puff by mouth once daily. Rinse mouth well after use.    . furosemide (LASIX) 20 MG  tablet Take 40 mg by mouth daily.     Marland Kitchen ipratropium (ATROVENT) 0.03 % nasal spray Place 0.03 mLs into both nostrils 3 (three) times daily as needed.  11  . KLOR-CON M20 20 MEQ tablet TAKE 1 TABLET BY MOUTH TWICE A DAY 180 tablet 1  . lenalidomide (REVLIMID) 20 MG capsule Take 1 capsule (20 mg total) by mouth daily. Take for 21 days then hold for 7 days. Repeat every 28 days. 21 capsule 0  . meloxicam (MOBIC) 15 MG tablet Take 1 tablet (15 mg total) by mouth daily as needed for pain.    . niacin 500 MG tablet Take 500 mg by mouth every morning.     Marland Kitchen omeprazole (PRILOSEC) 40 MG capsule Take 40 mg by mouth daily.    . prochlorperazine (COMPAZINE) 10 MG tablet Take 1 tablet (10 mg total) by mouth every 6 (six) hours as needed for nausea or vomiting. 30 tablet 0  . rosuvastatin (CRESTOR) 20 MG tablet Take 20 mg by mouth at bedtime.    . sertraline (ZOLOFT) 25 MG tablet Take 25 mg by mouth daily.    . Simethicone (GAS RELIEF PO) Take 1 Dose by mouth as needed.     Marland Kitchen spironolactone (ALDACTONE) 25 MG tablet Take 25 mg by mouth daily.    . tamsulosin (FLOMAX) 0.4 MG CAPS capsule Take 0.4 mg by mouth daily.     . vitamin B-12 (CYANOCOBALAMIN) 1000 MCG tablet Take 1,000 mcg by mouth daily.    . vitamin C (ASCORBIC ACID) 500 MG tablet Take 500 mg by mouth daily.     No current facility-administered medications for this visit.    OBJECTIVE: Vitals:   12/25/19 1000  BP: (!) 132/58  Pulse: 77  Resp: 18  Temp: (!) 97 F (36.1 C)  SpO2: 99%     Body mass index is 23.79 kg/m.    ECOG FS:1 - Symptomatic but completely ambulatory  General: Well-developed, well-nourished, no acute distress. Eyes: Pink conjunctiva, anicteric sclera. HEENT: Normocephalic, moist mucous membranes. Lungs: No audible wheezing or coughing. Heart: Regular rate and rhythm. Abdomen: Soft, nontender, no obvious distention. Musculoskeletal: No edema, cyanosis, or clubbing. Neuro: Alert, answering all questions appropriately.  Cranial nerves grossly intact. Skin: No rashes or petechiae noted. Psych: Normal affect.  LAB RESULTS:  Lab Results  Component Value Date   NA 134 (L) 12/25/2019   K 3.1 (L) 12/25/2019   CL 96 (L) 12/25/2019   CO2 26 12/25/2019   GLUCOSE 131 (H) 12/25/2019   BUN 25 (H) 12/25/2019   CREATININE 1.11 12/25/2019   CALCIUM 8.5 (L) 12/25/2019   PROT 6.2 (L) 12/25/2019   ALBUMIN 3.3 (L) 12/25/2019   AST 79 (H) 12/25/2019   ALT 14 12/25/2019   ALKPHOS 108 12/25/2019   BILITOT 0.8 12/25/2019   GFRNONAA >60 12/25/2019   GFRAA >60 12/25/2019    Lab Results  Component Value Date   WBC 3.5 (L) 12/25/2019   NEUTROABS 1.4 (L)  12/25/2019   HGB 10.6 (L) 12/25/2019   HCT 32.5 (L) 12/25/2019   MCV 91.8 12/25/2019   PLT 60 (L) 12/25/2019     STUDIES: No results found.  ASSESSMENT: Kappa light chain myeloma.  PLAN:    1. Kappa light chain myeloma: Bone marrow biopsy on May 01, 2019 revealed increased plasma cells of 30 to 40% with kappa light chain restriction.  SPEP is negative and IgG, IgA, and IgM are essentially within normal limits.  Patient's kappa free light chains initially trended up to 950.4, but now are trending back down and his most recent result was 805.8.  Kappa/lambda light chain ratio is also trending down at 55.57.  Patient initiated low-dose 10 mg Revlimid daily at the beginning of November 2020 and increased to current dosing of 20 mg daily for 21 days with 7 days off approximately 1 month ago.  Given his pancytopenia, may have to decrease current dose, or change treatments altogether.  Proceed with Zometa today.  Return to clinic in 4 weeks for further evaluation and continuation of treatment.   2.  Right clavicle fracture: Pathologic.  Resolved.  Patient reports no further interventions are needed by orthopedics. 3.  Hypocalcemia: Resolved.  Calcium is 8.5 today. 4.  Iron deficiency anemia: Chronic and unchanged.  Hemoglobin 10.6. 5.  Thrombocytopenia: Platelet  count of 60.  Monitor closely while taking Revlimid.  Consider dose reduction as above. 6.  Leukopenia: Mild, monitor. 7.  Constipation: Patient does not complain of this today.  He was previously instructed to use MiraLAX more regularly. 8.  Hypokalemia: Chronic and unchanged.  Patient admits he has difficulty taking oral potassium supplements.  Proceed with 20 mEq IV potassium today.   9.  Dental pain: Patient reports no interventions by his dentist currently.  Proceed with Zometa as above.  Patient expressed understanding and was in agreement with this plan. He also understands that He can call clinic at any time with any questions, concerns, or complaints.    Lloyd Huger, MD   12/25/2019 5:07 PM

## 2019-12-25 ENCOUNTER — Inpatient Hospital Stay (HOSPITAL_BASED_OUTPATIENT_CLINIC_OR_DEPARTMENT_OTHER): Payer: Medicare Other | Admitting: Oncology

## 2019-12-25 ENCOUNTER — Other Ambulatory Visit: Payer: Self-pay

## 2019-12-25 ENCOUNTER — Inpatient Hospital Stay: Payer: Medicare Other

## 2019-12-25 ENCOUNTER — Inpatient Hospital Stay: Payer: Medicare Other | Attending: Oncology

## 2019-12-25 ENCOUNTER — Encounter: Payer: Self-pay | Admitting: Oncology

## 2019-12-25 VITALS — BP 132/58 | HR 77 | Temp 97.0°F | Resp 18 | Wt 165.8 lb

## 2019-12-25 DIAGNOSIS — D696 Thrombocytopenia, unspecified: Secondary | ICD-10-CM | POA: Diagnosis not present

## 2019-12-25 DIAGNOSIS — G20 Parkinson's disease: Secondary | ICD-10-CM | POA: Diagnosis not present

## 2019-12-25 DIAGNOSIS — C9000 Multiple myeloma not having achieved remission: Secondary | ICD-10-CM

## 2019-12-25 DIAGNOSIS — K0889 Other specified disorders of teeth and supporting structures: Secondary | ICD-10-CM | POA: Diagnosis not present

## 2019-12-25 DIAGNOSIS — Z809 Family history of malignant neoplasm, unspecified: Secondary | ICD-10-CM | POA: Diagnosis not present

## 2019-12-25 DIAGNOSIS — Z85828 Personal history of other malignant neoplasm of skin: Secondary | ICD-10-CM | POA: Insufficient documentation

## 2019-12-25 DIAGNOSIS — R531 Weakness: Secondary | ICD-10-CM | POA: Insufficient documentation

## 2019-12-25 DIAGNOSIS — D509 Iron deficiency anemia, unspecified: Secondary | ICD-10-CM | POA: Insufficient documentation

## 2019-12-25 DIAGNOSIS — D61818 Other pancytopenia: Secondary | ICD-10-CM | POA: Diagnosis not present

## 2019-12-25 DIAGNOSIS — K5900 Constipation, unspecified: Secondary | ICD-10-CM | POA: Insufficient documentation

## 2019-12-25 DIAGNOSIS — E876 Hypokalemia: Secondary | ICD-10-CM | POA: Insufficient documentation

## 2019-12-25 DIAGNOSIS — J449 Chronic obstructive pulmonary disease, unspecified: Secondary | ICD-10-CM | POA: Insufficient documentation

## 2019-12-25 LAB — CBC WITH DIFFERENTIAL/PLATELET
Abs Immature Granulocytes: 0.01 10*3/uL (ref 0.00–0.07)
Basophils Absolute: 0 10*3/uL (ref 0.0–0.1)
Basophils Relative: 0 %
Eosinophils Absolute: 0.2 10*3/uL (ref 0.0–0.5)
Eosinophils Relative: 6 %
HCT: 32.5 % — ABNORMAL LOW (ref 39.0–52.0)
Hemoglobin: 10.6 g/dL — ABNORMAL LOW (ref 13.0–17.0)
Immature Granulocytes: 0 %
Lymphocytes Relative: 48 %
Lymphs Abs: 1.7 10*3/uL (ref 0.7–4.0)
MCH: 29.9 pg (ref 26.0–34.0)
MCHC: 32.6 g/dL (ref 30.0–36.0)
MCV: 91.8 fL (ref 80.0–100.0)
Monocytes Absolute: 0.2 10*3/uL (ref 0.1–1.0)
Monocytes Relative: 6 %
Neutro Abs: 1.4 10*3/uL — ABNORMAL LOW (ref 1.7–7.7)
Neutrophils Relative %: 40 %
Platelets: 60 10*3/uL — ABNORMAL LOW (ref 150–400)
RBC: 3.54 MIL/uL — ABNORMAL LOW (ref 4.22–5.81)
RDW: 20.3 % — ABNORMAL HIGH (ref 11.5–15.5)
WBC: 3.5 10*3/uL — ABNORMAL LOW (ref 4.0–10.5)
nRBC: 0 % (ref 0.0–0.2)

## 2019-12-25 LAB — COMPREHENSIVE METABOLIC PANEL
ALT: 14 U/L (ref 0–44)
AST: 79 U/L — ABNORMAL HIGH (ref 15–41)
Albumin: 3.3 g/dL — ABNORMAL LOW (ref 3.5–5.0)
Alkaline Phosphatase: 108 U/L (ref 38–126)
Anion gap: 12 (ref 5–15)
BUN: 25 mg/dL — ABNORMAL HIGH (ref 8–23)
CO2: 26 mmol/L (ref 22–32)
Calcium: 8.5 mg/dL — ABNORMAL LOW (ref 8.9–10.3)
Chloride: 96 mmol/L — ABNORMAL LOW (ref 98–111)
Creatinine, Ser: 1.11 mg/dL (ref 0.61–1.24)
GFR calc Af Amer: >60 mL/min (ref >60)
GFR calc non Af Amer: >60 mL/min (ref >60)
Glucose, Bld: 131 mg/dL — ABNORMAL HIGH (ref 70–99)
Potassium: 3.1 mmol/L — ABNORMAL LOW (ref 3.5–5.1)
Sodium: 134 mmol/L — ABNORMAL LOW (ref 135–145)
Total Bilirubin: 0.8 mg/dL (ref 0.3–1.2)
Total Protein: 6.2 g/dL — ABNORMAL LOW (ref 6.5–8.1)

## 2019-12-25 MED ORDER — SODIUM CHLORIDE 0.9 % IV SOLN
Freq: Once | INTRAVENOUS | Status: AC
  Filled 2019-12-25: qty 250

## 2019-12-25 MED ORDER — SODIUM CHLORIDE 0.9 % IV SOLN
20.0000 meq | Freq: Once | INTRAVENOUS | Status: AC
  Administered 2019-12-25: 20 meq via INTRAVENOUS
  Filled 2019-12-25: qty 10

## 2019-12-25 MED ORDER — ZOLEDRONIC ACID 4 MG/100ML IV SOLN
4.0000 mg | Freq: Once | INTRAVENOUS | Status: AC
  Administered 2019-12-25: 4 mg via INTRAVENOUS
  Filled 2019-12-25: qty 100

## 2019-12-25 MED ORDER — SODIUM CHLORIDE 0.9 % IV SOLN: 20.0000 meq | Freq: Once | INTRAVENOUS | Status: DC

## 2019-12-25 NOTE — Progress Notes (Signed)
Addendum to previous note. Orders were given by Dr. Grayland Ormond.

## 2019-12-25 NOTE — Progress Notes (Signed)
Calcium 8.5, Per Benjamine Mola RN per Dr. Tasia Catchings okay to proceed with Zometa. Pt to also receive 20 MEQs of IV Potassium.

## 2019-12-25 NOTE — Progress Notes (Signed)
Patient here today for follow up. Reports he has had some neck pain and been taking meloxicam. Denies any pain today. Denies any other concerns at this time.

## 2019-12-26 LAB — IGG, IGA, IGM
IgA: 106 mg/dL (ref 61–437)
IgG (Immunoglobin G), Serum: 644 mg/dL (ref 603–1613)
IgM (Immunoglobulin M), Srm: 34 mg/dL (ref 15–143)

## 2019-12-26 LAB — KAPPA/LAMBDA LIGHT CHAINS
Kappa free light chain: 904.7 mg/L — ABNORMAL HIGH (ref 3.3–19.4)
Kappa, lambda light chain ratio: 51.7 — ABNORMAL HIGH (ref 0.26–1.65)
Lambda free light chains: 17.5 mg/L (ref 5.7–26.3)

## 2020-01-04 ENCOUNTER — Other Ambulatory Visit: Payer: Self-pay | Admitting: Emergency Medicine

## 2020-01-04 ENCOUNTER — Telehealth: Payer: Self-pay

## 2020-01-04 DIAGNOSIS — C9000 Multiple myeloma not having achieved remission: Secondary | ICD-10-CM

## 2020-01-04 DIAGNOSIS — N400 Enlarged prostate without lower urinary tract symptoms: Secondary | ICD-10-CM

## 2020-01-04 MED ORDER — LENALIDOMIDE 20 MG PO CAPS: 20.0000 mg | ORAL_CAPSULE | Freq: Every day | ORAL | 0 refills | Status: DC

## 2020-01-04 MED ORDER — FINASTERIDE 5 MG PO TABS: 5.0000 mg | ORAL_TABLET | Freq: Every day | ORAL | 3 refills | Status: DC

## 2020-01-04 MED ORDER — TAMSULOSIN HCL 0.4 MG PO CAPS: 0.4000 mg | ORAL_CAPSULE | Freq: Every day | ORAL | 3 refills | Status: DC

## 2020-01-04 NOTE — Telephone Encounter (Signed)
Incoming pt request for 90 day supply to [facility] mail order. RX sent.

## 2020-01-08 ENCOUNTER — Other Ambulatory Visit: Payer: Self-pay | Admitting: *Deleted

## 2020-01-08 DIAGNOSIS — C9000 Multiple myeloma not having achieved remission: Secondary | ICD-10-CM

## 2020-01-08 MED ORDER — LENALIDOMIDE 20 MG PO CAPS: 20.0000 mg | ORAL_CAPSULE | Freq: Every day | ORAL | 0 refills | Status: DC

## 2020-01-14 ENCOUNTER — Telehealth: Payer: Self-pay

## 2020-01-14 NOTE — Telephone Encounter (Signed)
Telephone call to schedule palliative care visit.  Patient in agreement with RN makin ghome visit 01/21/20 at 2:00 PM.

## 2020-01-17 NOTE — Progress Notes (Signed)
Adam Moss  Telephone:([phone]) [phone] [phone]) [phone]  ID: Adam Moss OB: 1934/06/26  MR#: [id number]  WCB#:[id number]  Patient Care Team: Perrin Maltese, MD as PCP - General (Internal Medicine) Lloyd Huger, MD as Consulting Physician (Hematology and Oncology)   CHIEF COMPLAINT: Kappa light chain myeloma.   INTERVAL HISTORY: Patient returns to clinic today for further evaluation and continuation of treatment. He currently takes 20 mg daily for 21 days with 7 days off. He has chronic weakness and fatigue, but is otherwise tolerating his treatments well.  He continues to complain of constipation. He has a resting tremor secondary to his Parkinson's, but no other neurologic complaints.  He denies any recent fevers or illnesses.  He has no chest pain, shortness of breath, cough, or hemoptysis.  He denies any nausea, vomiting, or diarrhea.  He has no urinary complaints.  Patient offers no further specific complaints today.  REVIEW OF SYSTEMS:   Review of Systems  Constitutional: Positive for malaise/fatigue. Negative for fever and weight loss.  Respiratory: Negative.  Negative for cough, hemoptysis and shortness of breath.   Cardiovascular: Negative.  Negative for chest pain and leg swelling.  Gastrointestinal: Positive for constipation. Negative for abdominal pain.  Genitourinary: Negative.  Negative for dysuria.  Musculoskeletal: Negative.  Negative for back pain.  Skin: Negative.  Negative for rash.  Neurological: Positive for tremors and weakness. Negative for dizziness, focal weakness and headaches.  Psychiatric/Behavioral: Negative.  The patient is not nervous/anxious.     As per HPI. Otherwise, a complete review of systems is negative.  PAST MEDICAL HISTORY: Past Medical History:  Diagnosis Date  . Cancer ([place])    skin  . COPD (chronic obstructive pulmonary disease) ([place])   . Hyperlipemia   . Hypertension   . Lymphedema of both lower  extremities   . Parkinson disease ([place])   . Parkinsonism ([place]) 02/21/2015  . Spinal stenosis   . Tremor     PAST SURGICAL HISTORY: Past Surgical History:  Procedure Laterality Date  . APPENDECTOMY    . CATARACT EXTRACTION    . CHOLECYSTECTOMY    . TONSILLECTOMY    . VASECTOMY      FAMILY HISTORY: Family History  Problem Relation Age of Onset  . Cancer Mother   . Heart disease Father     ADVANCED DIRECTIVES (Y/N):  N  HEALTH MAINTENANCE: Social History   Tobacco Use  . Smoking status: Never Smoker  . Smokeless tobacco: Never Used  Vaping Use  . Vaping Use: Never used  Substance Use Topics  . Alcohol use: No    Alcohol/week: 0.0 standard drinks  . Drug use: No     Colonoscopy:  PAP:  Bone density:  Lipid panel:  Allergies  Allergen Reactions  . Carbidopa-Levodopa Other (See Comments)    Severe stomach pains, can take rytary  . Oxycodone-Acetaminophen Other (See Comments)    "boils on skin"  . Tyloxapol   . Acetaminophen Rash, Nausea And Vomiting and Hives  . Pramipexole Nausea Only    Current Outpatient Medications  Medication Sig Dispense Refill  . aspirin 81 MG tablet Take 81 mg by mouth daily.    . Calcium Carbonate-Vitamin D (OYSTER SHELL CALCIUM/D) 250-125 MG-UNIT TABS Take by mouth.    . Calcium Citrate 250 MG TABS Take 1 tablet by mouth daily.     . Carbidopa-Levodopa ER (RYTARY) 48.75-195 MG CPCR Take 195 mg by mouth 5 (five) times daily. 450 capsule 3  . cetirizine (ZYRTEC)  10 MG tablet Take 10 mg by mouth daily.    . enalapril (VASOTEC) 2.5 MG tablet Take 2.5 mg by mouth daily. (Patient not taking: Reported on 01/21/2020)    . finasteride (PROSCAR) 5 MG tablet Take 1 tablet (5 mg total) by mouth daily. 90 tablet 3  . fluticasone (FLONASE) 50 MCG/ACT nasal spray Place into both nostrils daily.    . Fluticasone-Umeclidin-Vilant (TRELEGY ELLIPTA) 100-62.5-25 MCG/INH AEPB Trelegy Ellipta 100-62.5-25 MCG/INH Inhalation Aerosol Powder Breath Activated  QTY: 3 inhaler Days: 90 Refills: 3  Written: 08/02/19 Patient Instructions: inhale 1 puff by mouth once daily. Rinse mouth well after use.    . furosemide (LASIX) 20 MG tablet Take 40 mg by mouth daily.     Marland Kitchen ipratropium (ATROVENT) 0.03 % nasal spray Place 0.03 mLs into both nostrils 3 (three) times daily as needed.  11  . KLOR-CON M20 20 MEQ tablet TAKE 1 TABLET BY MOUTH TWICE A DAY (Patient not taking: Reported on 01/21/2020) 180 tablet 1  . lenalidomide (REVLIMID) 20 MG capsule Take 1 capsule (20 mg total) by mouth daily. Take for 21 days then hold for 7 days. Repeat every 28 days. 21 capsule 0  . meloxicam (MOBIC) 15 MG tablet Take 1 tablet (15 mg total) by mouth daily as needed for pain.    . niacin 500 MG tablet Take 500 mg by mouth every morning.     Marland Kitchen omeprazole (PRILOSEC) 40 MG capsule Take 40 mg by mouth daily.    . prochlorperazine (COMPAZINE) 10 MG tablet Take 1 tablet (10 mg total) by mouth every 6 (six) hours as needed for nausea or vomiting. 30 tablet 0  . rosuvastatin (CRESTOR) 20 MG tablet Take 20 mg by mouth at bedtime.    . sertraline (ZOLOFT) 25 MG tablet Take 25 mg by mouth daily. (Patient not taking: Reported on 01/21/2020)    . Simethicone (GAS RELIEF PO) Take 1 Dose by mouth as needed.     Marland Kitchen spironolactone (ALDACTONE) 25 MG tablet Take 25 mg by mouth daily.    . tamsulosin (FLOMAX) 0.4 MG CAPS capsule Take 1 capsule (0.4 mg total) by mouth daily. 90 capsule 3  . vitamin B-12 (CYANOCOBALAMIN) 1000 MCG tablet Take 1,000 mcg by mouth daily.    . vitamin C (ASCORBIC ACID) 500 MG tablet Take 500 mg by mouth daily. (Patient not taking: Reported on 01/21/2020)     No current facility-administered medications for this visit.    OBJECTIVE: Vitals:   01/22/20 1050  BP: 109/76  Pulse: 80  Resp: 20  Temp: (!) 97.4 F (36.3 C)  SpO2: 100%     Body mass index is 23.78 kg/m.    ECOG FS:1 - Symptomatic but completely ambulatory  General: Well-developed, well-nourished, no acute  distress. Eyes: Pink conjunctiva, anicteric sclera. HEENT: Normocephalic, moist mucous membranes. Lungs: No audible wheezing or coughing. Heart: Regular rate and rhythm. Abdomen: Soft, nontender, no obvious distention. Musculoskeletal: No edema, cyanosis, or clubbing. Neuro: Alert, answering all questions appropriately. Resting tremor noted. Skin: No rashes or petechiae noted. Psych: Normal affect.   LAB RESULTS:  Lab Results  Component Value Date   NA 135 01/22/2020   K 3.1 (L) 01/22/2020   CL 97 (L) 01/22/2020   CO2 27 01/22/2020   GLUCOSE 126 (H) 01/22/2020   BUN 15 01/22/2020   CREATININE 1.12 01/22/2020   CALCIUM 8.6 (L) 01/22/2020   PROT 6.8 01/22/2020   ALBUMIN 3.7 01/22/2020   AST 80 (H) 01/22/2020  ALT 12 01/22/2020   ALKPHOS 82 01/22/2020   BILITOT 0.8 01/22/2020   GFRNONAA 60 (L) 01/22/2020   GFRAA >60 01/22/2020    Lab Results  Component Value Date   WBC 4.3 01/22/2020   NEUTROABS 1.9 01/22/2020   HGB 10.7 (L) 01/22/2020   HCT 33.0 (L) 01/22/2020   MCV 94.0 01/22/2020   PLT 94 (L) 01/22/2020     STUDIES: No results found.  ASSESSMENT: Kappa light chain myeloma.  PLAN:    1. Kappa light chain myeloma: Bone marrow biopsy on May 01, 2019 revealed increased plasma cells of 30 to 40% with kappa light chain restriction.  SPEP is negative and IgG, IgA, and IgM are within normal limits. Kappa free light chains are stable ranging from 805.8 to 950.4. Kappa/lambda light chain ratio is essentially stable at 55.70.  Today's results are pending.  Patient initiated low-dose 10 mg Revlimid daily at the beginning of November 2020 and increased to current dosing of 20 mg daily for 21 days with 7 days off.  Patient reports he did not receive he medication on time and started the most recent cycle about 11 days late.  Proceed with Zometa today.  Return to clinic in 4 weeks with repeat laboratory work and continuation of treatment.  2.  Right clavicle fracture:  Pathologic.  Resolved.  Patient reports no further interventions are needed by orthopedics. 3.  Hypocalcemia: Resolved.  Calcium is 8.6 today. 4.  Iron deficiency anemia: Chronic and unchanged.  Hemoglobin is 10.7. 5.  Thrombocytopenia: Improved. Platelet count is 94 today.  Monitor closely while taking Revlimid.  Consider dose reduction if needed. 6.  Leukopenia: Resolved. 7.  Constipation: Recommended OTC Magnesium citrate. 8.  Hypokalemia: Chronic and unchanged.  Continue oral supplementation.  9.  Dental pain: Patient reports no interventions by his dentist currently.  Proceed with Zometa as above.  Patient expressed understanding and was in agreement with this plan. He also understands that He can call clinic at any time with any questions, concerns, or complaints.    Lloyd Huger, MD   01/22/2020 3:40 PM

## 2020-01-21 ENCOUNTER — Encounter: Payer: Self-pay | Admitting: Oncology

## 2020-01-21 ENCOUNTER — Other Ambulatory Visit: Payer: Medicare Other

## 2020-01-21 ENCOUNTER — Other Ambulatory Visit: Payer: Self-pay

## 2020-01-21 NOTE — Progress Notes (Signed)
PATIENT NAME: Adam Moss DOB: 05/28/1934 MRN: [id number]  PRIMARY CARE PROVIDER: Khan, Neelam S, MD  RESPONSIBLE PARTY:  Acct ID - Guarantor Home Phone Work Phone Relationship Acct Type  [id number] - [name] [phone]  Self P/F     [address], [address], [place], [place] [address]    PLAN OF CARE and INTERVENTIONS:               1.  GOALS OF CARE/ ADVANCE CARE PLANNING:  Remain in senior living apartment for as long as possible.  Patient wants to continue treatment for multiple myeloma.                 2.  PATIENT/CAREGIVER EDUCATION:  Education on fall precautions, education on s/s of infection, dehydration, reviewed meds, support                  4. PERSONAL EMERGENCY PLAN:  Patient resides in a senior living community.                 5.  DISEASE STATUS:  RN made scheduled palliative care home visit. Nurse met with patient. Patient sitting in his chair watching TV. Patient alert and oriented. Patient continues to receive oral treatment for multiple myeloma. Patient takes Revlimid 20 mg for 21 days then is off medication for 7 days. Patient is doing better and lab worked showed improvement with taking Revlimid.  Patient continues to ambulate with his walker. [facility]. Patient reports [facility]. Patient denies suffering any recent falls. [facility] Patient reports his daughter who lives in [place] visited him last Wednesday. Patient states he has been going to dining room for meals. Patient reports his appetite is good if food is something he wants to eat. Patients weight on 6-1-201 was 165 lbs. Patient has had a 15 to 20 pound weight loss in the past year. Patient has a hired caregiver that comes to the home twice a week to assist him with bathing. Patient sleeps in his recliner chair. Patient has thigh-high TED hose in place. Edema in lower  extremities has improved. Patient also uses compression boots. Patients vital signs are stable other than BP is slightly low. Patient states he has not been drinking enough fluids. Nurse encouraged patient to try to increase fluid intake. Nurse reviewed patients current medications with patient. Patient remains in agreement with palliative care services. Patient encouraged to contact palliative care with questions or concerns.      HISTORY OF PRESENT ILLNESS: Patient is a 86 year old male who is being treated for multiple myeloma. Patient is followed by palliative care.  Patient is seen monthly and PRN.  CODE STATUS: DNR (Facility has DNR)  ADVANCED DIRECTIVES: Y MOST FORM: No PPS: 50%   PHYSICAL EXAM:   VITALS: Today's Vitals   01/21/20 1359  PainSc: 0-No pain    LUNGS: clear to auscultation  CARDIAC: Cor RRR  EXTREMITIES: 1+ edema SKIN: Skin color, texture, turgor normal. No rashes or lesions  NEURO: positive for gait problems and weakness       Virgie Jollay, RN 

## 2020-01-21 NOTE — Progress Notes (Signed)
Patient called for pre assessment. He denies any pain or concerns at this time.  

## 2020-01-22 ENCOUNTER — Inpatient Hospital Stay: Payer: Medicare Other

## 2020-01-22 ENCOUNTER — Inpatient Hospital Stay (HOSPITAL_BASED_OUTPATIENT_CLINIC_OR_DEPARTMENT_OTHER): Payer: Medicare Other | Admitting: Oncology

## 2020-01-22 ENCOUNTER — Encounter: Payer: Self-pay | Admitting: Oncology

## 2020-01-22 ENCOUNTER — Other Ambulatory Visit: Payer: Self-pay

## 2020-01-22 VITALS — BP 109/76 | HR 80 | Temp 97.4°F | Resp 20 | Ht 70.0 in | Wt 165.7 lb

## 2020-01-22 DIAGNOSIS — C9000 Multiple myeloma not having achieved remission: Secondary | ICD-10-CM

## 2020-01-22 DIAGNOSIS — E876 Hypokalemia: Secondary | ICD-10-CM | POA: Diagnosis not present

## 2020-01-22 LAB — CBC WITH DIFFERENTIAL/PLATELET
Abs Immature Granulocytes: 0.03 10*3/uL (ref 0.00–0.07)
Basophils Absolute: 0 10*3/uL (ref 0.0–0.1)
Basophils Relative: 0 %
Eosinophils Absolute: 0.2 10*3/uL (ref 0.0–0.5)
Eosinophils Relative: 4 %
HCT: 33 % — ABNORMAL LOW (ref 39.0–52.0)
Hemoglobin: 10.7 g/dL — ABNORMAL LOW (ref 13.0–17.0)
Immature Granulocytes: 1 %
Lymphocytes Relative: 40 %
Lymphs Abs: 1.7 10*3/uL (ref 0.7–4.0)
MCH: 30.5 pg (ref 26.0–34.0)
MCHC: 32.4 g/dL (ref 30.0–36.0)
MCV: 94 fL (ref 80.0–100.0)
Monocytes Absolute: 0.5 10*3/uL (ref 0.1–1.0)
Monocytes Relative: 11 %
Neutro Abs: 1.9 10*3/uL (ref 1.7–7.7)
Neutrophils Relative %: 44 %
Platelets: 94 10*3/uL — ABNORMAL LOW (ref 150–400)
RBC: 3.51 MIL/uL — ABNORMAL LOW (ref 4.22–5.81)
RDW: 20.1 % — ABNORMAL HIGH (ref 11.5–15.5)
WBC: 4.3 10*3/uL (ref 4.0–10.5)
nRBC: 0.5 % — ABNORMAL HIGH (ref 0.0–0.2)

## 2020-01-22 LAB — COMPREHENSIVE METABOLIC PANEL
ALT: 12 U/L (ref 0–44)
AST: 80 U/L — ABNORMAL HIGH (ref 15–41)
Albumin: 3.7 g/dL (ref 3.5–5.0)
Alkaline Phosphatase: 82 U/L (ref 38–126)
Anion gap: 11 (ref 5–15)
BUN: 15 mg/dL (ref 8–23)
CO2: 27 mmol/L (ref 22–32)
Calcium: 8.6 mg/dL — ABNORMAL LOW (ref 8.9–10.3)
Chloride: 97 mmol/L — ABNORMAL LOW (ref 98–111)
Creatinine, Ser: 1.12 mg/dL (ref 0.61–1.24)
GFR calc Af Amer: >60 mL/min (ref >60)
GFR calc non Af Amer: 60 mL/min — ABNORMAL LOW (ref >60)
Glucose, Bld: 126 mg/dL — ABNORMAL HIGH (ref 70–99)
Potassium: 3.1 mmol/L — ABNORMAL LOW (ref 3.5–5.1)
Sodium: 135 mmol/L (ref 135–145)
Total Bilirubin: 0.8 mg/dL (ref 0.3–1.2)
Total Protein: 6.8 g/dL (ref 6.5–8.1)

## 2020-01-22 MED ORDER — ZOLEDRONIC ACID 4 MG/100ML IV SOLN
4.0000 mg | Freq: Once | INTRAVENOUS | Status: AC
  Administered 2020-01-22: 4 mg via INTRAVENOUS
  Filled 2020-01-22: qty 100

## 2020-01-22 MED ORDER — SODIUM CHLORIDE 0.9 % IV SOLN
Freq: Once | INTRAVENOUS | Status: AC
  Filled 2020-01-22: qty 250

## 2020-01-23 LAB — KAPPA/LAMBDA LIGHT CHAINS
Kappa free light chain: 901.8 mg/L — ABNORMAL HIGH (ref 3.3–19.4)
Kappa, lambda light chain ratio: 54.65 — ABNORMAL HIGH (ref 0.26–1.65)
Lambda free light chains: 16.5 mg/L (ref 5.7–26.3)

## 2020-01-23 LAB — IGG, IGA, IGM
IgA: 104 mg/dL (ref 61–437)
IgG (Immunoglobin G), Serum: 659 mg/dL (ref 603–1613)
IgM (Immunoglobulin M), Srm: 34 mg/dL (ref 15–143)

## 2020-01-25 DIAGNOSIS — M6281 Muscle weakness (generalized): Secondary | ICD-10-CM | POA: Diagnosis not present

## 2020-01-25 DIAGNOSIS — G20 Parkinson's disease: Secondary | ICD-10-CM | POA: Diagnosis not present

## 2020-01-25 DIAGNOSIS — R2689 Other abnormalities of gait and mobility: Secondary | ICD-10-CM | POA: Diagnosis not present

## 2020-01-29 DIAGNOSIS — R2689 Other abnormalities of gait and mobility: Secondary | ICD-10-CM | POA: Diagnosis not present

## 2020-01-29 DIAGNOSIS — M6281 Muscle weakness (generalized): Secondary | ICD-10-CM | POA: Diagnosis not present

## 2020-01-29 DIAGNOSIS — G20 Parkinson's disease: Secondary | ICD-10-CM | POA: Diagnosis not present

## 2020-01-30 ENCOUNTER — Other Ambulatory Visit: Payer: Self-pay | Admitting: *Deleted

## 2020-01-30 DIAGNOSIS — C9000 Multiple myeloma not having achieved remission: Secondary | ICD-10-CM

## 2020-01-31 MED ORDER — LENALIDOMIDE 20 MG PO CAPS: 20.0000 mg | ORAL_CAPSULE | Freq: Every day | ORAL | 0 refills | Status: DC

## 2020-02-05 ENCOUNTER — Telehealth: Payer: Self-pay

## 2020-02-05 ENCOUNTER — Other Ambulatory Visit: Payer: Self-pay | Admitting: Oncology

## 2020-02-05 DIAGNOSIS — C9000 Multiple myeloma not having achieved remission: Secondary | ICD-10-CM

## 2020-02-05 NOTE — Telephone Encounter (Signed)
Telephone call to schedule palliative care visit.  Patient in agreement with RN making home visit 02/11/20 at 11:30 AM.

## 2020-02-07 ENCOUNTER — Ambulatory Visit: Payer: Medicare Other | Admitting: Podiatry

## 2020-02-11 ENCOUNTER — Other Ambulatory Visit: Payer: Self-pay

## 2020-02-11 ENCOUNTER — Other Ambulatory Visit: Payer: Medicare Other

## 2020-02-11 NOTE — Progress Notes (Signed)
PATIENT NAME: Adam Moss DOB: 1934/05/26 MRN: [id number]  PRIMARY CARE PROVIDER: Perrin Maltese, MD  RESPONSIBLE PARTY:  Acct ID - Guarantor Home Phone Work Phone Relationship Acct Type  [id number] [name]  Self P/F     [address], [place], [place], [place] [address]    PLAN OF CARE and INTERVENTIONS:               1.  GOALS OF CARE/ ADVANCE CARE PLANNING:  Remain in senior living apartment and continue to receive treatment for multiple myeloma.                2.  PATIENT/CAREGIVER EDUCATION:   Education on fall precautions, education on s/s of infection, reviewed meds, support               4. PERSONAL EMERGENCY PLAN:  Patient resides in senior living apartment at [facility].                5.  DISEASE STATUS:RN made scheduled palliative care home visit. Patient inside his apartment when nurse arrived. Patient states he cannot get to door as he is have he has his compression pump on legs.  Nurse calls maintenance and maintenance opens door for nurse. Patient alert and oriented. Patient rates pain at 2 on pain scale and reports pain "is all over." Patient states he forgot nurse was making visit today. Patient continues to have chronic weakness and fatigue. Patient continues to receive treatment for multiple myeloma. Patient is treated for multiple myeloma for 21 days and then is off  treatment for 7 days. Patient reports he has not suffered any falls since nurse made last visit. Patient continues to sleep in his recliner. Patient uses rollator walker when he ambulates. Patient has hired  caregiver come to the home twice per week on Tuesdays and Fridays to assist him with bathing. Patient continues to have resting tremor from Parkinson's disease. Patients current weight is 165 lbs. Patient reports his appetite is poor. Patient has not been to the dining room for meals for several weeks and reports he has not walked to mailbox since his last fall.  Patient denies any  shortness of breath or cough. Patient reports he has had a lot of hiccups for the past week. Patient reports he takes Mobic every now and then for pain. Patient reports he is doing okay with getting food and his children brought him water when water was contaminated last week. Patient continues to have monthly blood tests to determine if treatment if treatment is working for multiple myeloma. Patients vital signs are stable. Patient has alert button in home should he need help in his apartment. Patient denies having any current changes in his medications. Patient remains in agreement with palliative care services. Patient encouraged to contact palliative care with questions or concerns.      HISTORY OF PRESENT ILLNESS:  Patient is a 86 year old male who resides in senior living apartment.  Patient continue to receive treatment for myeloma.  Patient is seen monthly and PRN.        CODE STATUS: DNR  ADVANCED DIRECTIVES: Y MOST FORM: No PPS: 50%   PHYSICAL EXAM:   VITALS: Today's Vitals   02/11/20 1150  Weight: 165 lb (74.8 kg)  Height: '5\' 10"'  (1.778 m)  PainSc: 2   PainLoc: Generalized    LUNGS: clear to auscultation  CARDIAC: Cor RRR  EXTREMITIES: 2+ edema in LE's, TED hose in place  SKIN: bruising on upper arms bilaterally  NEURO: positive for gait problems, weakness  [name], RN

## 2020-02-12 DIAGNOSIS — R2689 Other abnormalities of gait and mobility: Secondary | ICD-10-CM | POA: Diagnosis not present

## 2020-02-12 DIAGNOSIS — G20 Parkinson's disease: Secondary | ICD-10-CM | POA: Diagnosis not present

## 2020-02-12 DIAGNOSIS — M6281 Muscle weakness (generalized): Secondary | ICD-10-CM | POA: Diagnosis not present

## 2020-02-18 NOTE — Progress Notes (Deleted)
[place]  [phone] [phone]  ID: Adam Moss OB: 26-Jan-1934  MR#: [id number]  PIR#:[id number]  Patient Care Team: Perrin Maltese, MD as PCP - General (Internal Medicine) Lloyd Huger, MD as Consulting Physician (Hematology and Oncology)   CHIEF COMPLAINT: Kappa light chain myeloma.   INTERVAL HISTORY: Patient returns to clinic today for further evaluation and continuation of treatment. He currently takes 20 mg daily for 21 days with 7 days off. He has chronic weakness and fatigue, but is otherwise tolerating his treatments well.  He continues to complain of constipation. He has a resting tremor secondary to his Parkinson's, but no other neurologic complaints.  He denies any recent fevers or illnesses.  He has no chest pain, shortness of breath, cough, or hemoptysis.  He denies any nausea, vomiting, or diarrhea.  He has no urinary complaints.  Patient offers no further specific complaints today.  REVIEW OF SYSTEMS:   Review of Systems  Constitutional: Positive for malaise/fatigue. Negative for fever and weight loss.  Respiratory: Negative.  Negative for cough, hemoptysis and shortness of breath.   Cardiovascular: Negative.  Negative for chest pain and leg swelling.  Gastrointestinal: Positive for constipation. Negative for abdominal pain.  Genitourinary: Negative.  Negative for dysuria.  Musculoskeletal: Negative.  Negative for back pain.  Skin: Negative.  Negative for rash.  Neurological: Positive for tremors and weakness. Negative for dizziness, focal weakness and headaches.  Psychiatric/Behavioral: Negative.  The patient is not nervous/anxious.     As per HPI. Otherwise, a complete review of systems is negative.  PAST MEDICAL HISTORY: Past Medical History:  Diagnosis Date  . Cancer ([place])    skin  . COPD (chronic obstructive pulmonary disease) ([place])   . Hyperlipemia   . Hypertension   . Lymphedema of both lower  extremities   . Parkinson disease ([place])   . Parkinsonism ([place]) 02/21/2015  . Spinal stenosis   . Tremor     PAST SURGICAL HISTORY: Past Surgical History:  Procedure Laterality Date  . APPENDECTOMY    . CATARACT EXTRACTION    . CHOLECYSTECTOMY    . TONSILLECTOMY    . VASECTOMY      FAMILY HISTORY: Family History  Problem Relation Age of Onset  . Cancer Mother   . Heart disease Father     ADVANCED DIRECTIVES (Y/N):  N  HEALTH MAINTENANCE: Social History   Tobacco Use  . Smoking status: Never Smoker  . Smokeless tobacco: Never Used  Vaping Use  . Vaping Use: Never used  Substance Use Topics  . Alcohol use: No    Alcohol/week: 0.0 standard drinks  . Drug use: No     Colonoscopy:  PAP:  Bone density:  Lipid panel:  Allergies  Allergen Reactions  . Carbidopa-Levodopa Other (See Comments)    Severe stomach pains, can take rytary  . Oxycodone-Acetaminophen Other (See Comments)    "boils on skin"  . Tyloxapol   . Acetaminophen Rash, Nausea And Vomiting and Hives  . Pramipexole Nausea Only    Current Outpatient Medications  Medication Sig Dispense Refill  . aspirin 81 MG tablet Take 81 mg by mouth daily.    . Calcium Carbonate-Vitamin D (OYSTER SHELL CALCIUM/D) 250-125 MG-UNIT TABS Take by mouth.    . Calcium Citrate 250 MG TABS Take 1 tablet by mouth daily.     . Carbidopa-Levodopa ER (RYTARY) 48.75-195 MG CPCR Take 195 mg by mouth 5 (five) times daily. 450 capsule 3  . cetirizine (ZYRTEC)  10 MG tablet Take 10 mg by mouth daily.    . enalapril (VASOTEC) 2.5 MG tablet Take 2.5 mg by mouth daily. (Patient not taking: Reported on 01/21/2020)    . finasteride (PROSCAR) 5 MG tablet Take 1 tablet (5 mg total) by mouth daily. 90 tablet 3  . fluticasone (FLONASE) 50 MCG/ACT nasal spray Place into both nostrils daily.    . Fluticasone-Umeclidin-Vilant (TRELEGY ELLIPTA) 100-62.5-25 MCG/INH AEPB Trelegy Ellipta 100-62.5-25 MCG/INH Inhalation Aerosol Powder Breath Activated  QTY: 3 inhaler Days: 90 Refills: 3  Written: 08/02/19 Patient Instructions: inhale 1 puff by mouth once daily. Rinse mouth well after use.    . furosemide (LASIX) 20 MG tablet Take 40 mg by mouth daily.     Marland Kitchen ipratropium (ATROVENT) 0.03 % nasal spray Place 0.03 mLs into both nostrils 3 (three) times daily as needed.  11  . KLOR-CON M20 20 MEQ tablet TAKE 1 TABLET BY MOUTH TWICE A DAY (Patient not taking: Reported on 01/21/2020) 180 tablet 1  . lenalidomide (REVLIMID) 20 MG capsule Take 1 capsule (20 mg total) by mouth daily. Take for 21 days then hold for 7 days. Repeat every 28 days. 21 capsule 0  . meloxicam (MOBIC) 15 MG tablet Take 1 tablet (15 mg total) by mouth daily as needed for pain.    . niacin 500 MG tablet Take 500 mg by mouth every morning.     Marland Kitchen omeprazole (PRILOSEC) 40 MG capsule Take 40 mg by mouth daily.    . prochlorperazine (COMPAZINE) 10 MG tablet Take 1 tablet (10 mg total) by mouth every 6 (six) hours as needed for nausea or vomiting. 30 tablet 0  . rosuvastatin (CRESTOR) 20 MG tablet Take 20 mg by mouth at bedtime.    . sertraline (ZOLOFT) 25 MG tablet Take 25 mg by mouth daily. (Patient not taking: Reported on 01/21/2020)    . Simethicone (GAS RELIEF PO) Take 1 Dose by mouth as needed.     Marland Kitchen spironolactone (ALDACTONE) 25 MG tablet Take 25 mg by mouth daily.    . tamsulosin (FLOMAX) 0.4 MG CAPS capsule Take 1 capsule (0.4 mg total) by mouth daily. 90 capsule 3  . vitamin B-12 (CYANOCOBALAMIN) 1000 MCG tablet Take 1,000 mcg by mouth daily.    . vitamin C (ASCORBIC ACID) 500 MG tablet Take 500 mg by mouth daily. (Patient not taking: Reported on 01/21/2020)     No current facility-administered medications for this visit.    OBJECTIVE: There were no vitals filed for this visit.   There is no height or weight on file to calculate BMI.    ECOG FS:1 - Symptomatic but completely ambulatory  General: Well-developed, well-nourished, no acute distress. Eyes: Pink conjunctiva,  anicteric sclera. HEENT: Normocephalic, moist mucous membranes. Lungs: No audible wheezing or coughing. Heart: Regular rate and rhythm. Abdomen: Soft, nontender, no obvious distention. Musculoskeletal: No edema, cyanosis, or clubbing. Neuro: Alert, answering all questions appropriately. Resting tremor noted. Skin: No rashes or petechiae noted. Psych: Normal affect.   LAB RESULTS:  Lab Results  Component Value Date   NA 135 01/22/2020   K 3.1 (L) 01/22/2020   CL 97 (L) 01/22/2020   CO2 27 01/22/2020   GLUCOSE 126 (H) 01/22/2020   BUN 15 01/22/2020   CREATININE 1.12 01/22/2020   CALCIUM 8.6 (L) 01/22/2020   PROT 6.8 01/22/2020   ALBUMIN 3.7 01/22/2020   AST 80 (H) 01/22/2020   ALT 12 01/22/2020   ALKPHOS 82 01/22/2020   BILITOT 0.8  01/22/2020   GFRNONAA 60 (L) 01/22/2020   GFRAA >60 01/22/2020    Lab Results  Component Value Date   WBC 4.3 01/22/2020   NEUTROABS 1.9 01/22/2020   HGB 10.7 (L) 01/22/2020   HCT 33.0 (L) 01/22/2020   MCV 94.0 01/22/2020   PLT 94 (L) 01/22/2020     STUDIES: No results found.  ASSESSMENT: Kappa light chain myeloma.  PLAN:    1. Kappa light chain myeloma: Bone marrow biopsy on May 01, 2019 revealed increased plasma cells of 30 to 40% with kappa light chain restriction.  SPEP is negative and IgG, IgA, and IgM are within normal limits. Kappa free light chains are stable ranging from 805.8 to 950.4. Kappa/lambda light chain ratio is essentially stable at 55.70.  Today's results are pending.  Patient initiated low-dose 10 mg Revlimid daily at the beginning of November 2020 and increased to current dosing of 20 mg daily for 21 days with 7 days off.  Patient reports he did not receive he medication on time and started the most recent cycle about 11 days late.  Proceed with Zometa today.  Return to clinic in 4 weeks with repeat laboratory work and continuation of treatment.  2.  Right clavicle fracture: Pathologic.  Resolved.  Patient reports  no further interventions are needed by orthopedics. 3.  Hypocalcemia: Resolved.  Calcium is 8.6 today. 4.  Iron deficiency anemia: Chronic and unchanged.  Hemoglobin is 10.7. 5.  Thrombocytopenia: Improved. Platelet count is 94 today.  Monitor closely while taking Revlimid.  Consider dose reduction if needed. 6.  Leukopenia: Resolved. 7.  Constipation: Recommended OTC Magnesium citrate. 8.  Hypokalemia: Chronic and unchanged.  Continue oral supplementation.  9.  Dental pain: Patient reports no interventions by his dentist currently.  Proceed with Zometa as above.  Patient expressed understanding and was in agreement with this plan. He also understands that He can call clinic at any time with any questions, concerns, or complaints.    Lloyd Huger, MD   02/18/2020 11:18 PM

## 2020-02-19 ENCOUNTER — Ambulatory Visit: Payer: Medicare Other | Admitting: Neurology

## 2020-02-19 DIAGNOSIS — G20 Parkinson's disease: Secondary | ICD-10-CM | POA: Diagnosis not present

## 2020-02-19 DIAGNOSIS — R2689 Other abnormalities of gait and mobility: Secondary | ICD-10-CM | POA: Diagnosis not present

## 2020-02-19 DIAGNOSIS — M6281 Muscle weakness (generalized): Secondary | ICD-10-CM | POA: Diagnosis not present

## 2020-02-20 ENCOUNTER — Emergency Department: Payer: Medicare Other

## 2020-02-20 ENCOUNTER — Inpatient Hospital Stay
Admission: EM | Admit: 2020-02-20 | Discharge: 2020-02-25 | DRG: 542 | Disposition: A | Discharge status: Skilled Nursing Facility | Payer: Medicare Other | Attending: Internal Medicine | Admitting: Internal Medicine

## 2020-02-20 ENCOUNTER — Inpatient Hospital Stay: Payer: Medicare Other

## 2020-02-20 ENCOUNTER — Ambulatory Visit: Payer: Federal, State, Local not specified - PPO

## 2020-02-20 ENCOUNTER — Other Ambulatory Visit: Payer: Federal, State, Local not specified - PPO

## 2020-02-20 ENCOUNTER — Ambulatory Visit: Payer: Federal, State, Local not specified - PPO | Admitting: Oncology

## 2020-02-20 ENCOUNTER — Encounter: Payer: Self-pay | Admitting: Emergency Medicine

## 2020-02-20 ENCOUNTER — Other Ambulatory Visit: Payer: Self-pay

## 2020-02-20 ENCOUNTER — Telehealth: Payer: Self-pay | Admitting: *Deleted

## 2020-02-20 DIAGNOSIS — Z85828 Personal history of other malignant neoplasm of skin: Secondary | ICD-10-CM | POA: Diagnosis not present

## 2020-02-20 DIAGNOSIS — M8448XA Pathological fracture, other site, initial encounter for fracture: Secondary | ICD-10-CM | POA: Diagnosis not present

## 2020-02-20 DIAGNOSIS — E782 Mixed hyperlipidemia: Secondary | ICD-10-CM | POA: Diagnosis present

## 2020-02-20 DIAGNOSIS — W19XXXA Unspecified fall, initial encounter: Secondary | ICD-10-CM | POA: Diagnosis not present

## 2020-02-20 DIAGNOSIS — N189 Chronic kidney disease, unspecified: Secondary | ICD-10-CM | POA: Diagnosis present

## 2020-02-20 DIAGNOSIS — K2100 Gastro-esophageal reflux disease with esophagitis, without bleeding: Secondary | ICD-10-CM | POA: Diagnosis not present

## 2020-02-20 DIAGNOSIS — E871 Hypo-osmolality and hyponatremia: Secondary | ICD-10-CM | POA: Diagnosis present

## 2020-02-20 DIAGNOSIS — I10 Essential (primary) hypertension: Secondary | ICD-10-CM | POA: Diagnosis present

## 2020-02-20 DIAGNOSIS — Z7982 Long term (current) use of aspirin: Secondary | ICD-10-CM | POA: Diagnosis not present

## 2020-02-20 DIAGNOSIS — R410 Disorientation, unspecified: Secondary | ICD-10-CM | POA: Diagnosis present

## 2020-02-20 DIAGNOSIS — R296 Repeated falls: Secondary | ICD-10-CM | POA: Diagnosis present

## 2020-02-20 DIAGNOSIS — Z66 Do not resuscitate: Secondary | ICD-10-CM | POA: Diagnosis present

## 2020-02-20 DIAGNOSIS — I872 Venous insufficiency (chronic) (peripheral): Secondary | ICD-10-CM | POA: Diagnosis not present

## 2020-02-20 DIAGNOSIS — N400 Enlarged prostate without lower urinary tract symptoms: Secondary | ICD-10-CM | POA: Diagnosis present

## 2020-02-20 DIAGNOSIS — K5900 Constipation, unspecified: Secondary | ICD-10-CM | POA: Diagnosis not present

## 2020-02-20 DIAGNOSIS — S22080D Wedge compression fracture of T11-T12 vertebra, subsequent encounter for fracture with routine healing: Secondary | ICD-10-CM | POA: Diagnosis not present

## 2020-02-20 DIAGNOSIS — J410 Simple chronic bronchitis: Secondary | ICD-10-CM | POA: Diagnosis not present

## 2020-02-20 DIAGNOSIS — Z8249 Family history of ischemic heart disease and other diseases of the circulatory system: Secondary | ICD-10-CM | POA: Diagnosis not present

## 2020-02-20 DIAGNOSIS — C9000 Multiple myeloma not having achieved remission: Secondary | ICD-10-CM | POA: Diagnosis present

## 2020-02-20 DIAGNOSIS — D61818 Other pancytopenia: Secondary | ICD-10-CM | POA: Diagnosis present

## 2020-02-20 DIAGNOSIS — E876 Hypokalemia: Secondary | ICD-10-CM | POA: Diagnosis not present

## 2020-02-20 DIAGNOSIS — S2231XA Fracture of one rib, right side, initial encounter for closed fracture: Secondary | ICD-10-CM | POA: Diagnosis present

## 2020-02-20 DIAGNOSIS — Z79899 Other long term (current) drug therapy: Secondary | ICD-10-CM | POA: Diagnosis not present

## 2020-02-20 DIAGNOSIS — S199XXA Unspecified injury of neck, initial encounter: Secondary | ICD-10-CM | POA: Diagnosis not present

## 2020-02-20 DIAGNOSIS — Z791 Long term (current) use of non-steroidal anti-inflammatories (NSAID): Secondary | ICD-10-CM | POA: Diagnosis not present

## 2020-02-20 DIAGNOSIS — Z9181 History of falling: Secondary | ICD-10-CM | POA: Diagnosis not present

## 2020-02-20 DIAGNOSIS — W010XXA Fall on same level from slipping, tripping and stumbling without subsequent striking against object, initial encounter: Secondary | ICD-10-CM | POA: Diagnosis present

## 2020-02-20 DIAGNOSIS — I959 Hypotension, unspecified: Secondary | ICD-10-CM | POA: Diagnosis not present

## 2020-02-20 DIAGNOSIS — Z809 Family history of malignant neoplasm, unspecified: Secondary | ICD-10-CM

## 2020-02-20 DIAGNOSIS — Z885 Allergy status to narcotic agent status: Secondary | ICD-10-CM

## 2020-02-20 DIAGNOSIS — M8458XA Pathological fracture in neoplastic disease, other specified site, initial encounter for fracture: Principal | ICD-10-CM | POA: Diagnosis present

## 2020-02-20 DIAGNOSIS — S3993XA Unspecified injury of pelvis, initial encounter: Secondary | ICD-10-CM | POA: Diagnosis not present

## 2020-02-20 DIAGNOSIS — N179 Acute kidney failure, unspecified: Secondary | ICD-10-CM | POA: Diagnosis present

## 2020-02-20 DIAGNOSIS — T451X5A Adverse effect of antineoplastic and immunosuppressive drugs, initial encounter: Secondary | ICD-10-CM | POA: Diagnosis present

## 2020-02-20 DIAGNOSIS — E8770 Fluid overload, unspecified: Secondary | ICD-10-CM | POA: Diagnosis present

## 2020-02-20 DIAGNOSIS — R102 Pelvic and perineal pain: Secondary | ICD-10-CM | POA: Diagnosis not present

## 2020-02-20 DIAGNOSIS — R2689 Other abnormalities of gait and mobility: Secondary | ICD-10-CM | POA: Diagnosis not present

## 2020-02-20 DIAGNOSIS — R600 Localized edema: Secondary | ICD-10-CM | POA: Diagnosis not present

## 2020-02-20 DIAGNOSIS — D649 Anemia, unspecified: Secondary | ICD-10-CM | POA: Diagnosis not present

## 2020-02-20 DIAGNOSIS — Z20822 Contact with and (suspected) exposure to covid-19: Secondary | ICD-10-CM | POA: Diagnosis present

## 2020-02-20 DIAGNOSIS — J449 Chronic obstructive pulmonary disease, unspecified: Secondary | ICD-10-CM | POA: Diagnosis present

## 2020-02-20 DIAGNOSIS — R279 Unspecified lack of coordination: Secondary | ICD-10-CM | POA: Diagnosis not present

## 2020-02-20 DIAGNOSIS — I129 Hypertensive chronic kidney disease with stage 1 through stage 4 chronic kidney disease, or unspecified chronic kidney disease: Secondary | ICD-10-CM | POA: Diagnosis present

## 2020-02-20 DIAGNOSIS — D61810 Antineoplastic chemotherapy induced pancytopenia: Secondary | ICD-10-CM | POA: Diagnosis present

## 2020-02-20 DIAGNOSIS — R5381 Other malaise: Secondary | ICD-10-CM | POA: Diagnosis not present

## 2020-02-20 DIAGNOSIS — J90 Pleural effusion, not elsewhere classified: Secondary | ICD-10-CM | POA: Diagnosis not present

## 2020-02-20 DIAGNOSIS — K5909 Other constipation: Secondary | ICD-10-CM | POA: Diagnosis present

## 2020-02-20 DIAGNOSIS — Z79891 Long term (current) use of opiate analgesic: Secondary | ICD-10-CM

## 2020-02-20 DIAGNOSIS — Z9049 Acquired absence of other specified parts of digestive tract: Secondary | ICD-10-CM | POA: Diagnosis not present

## 2020-02-20 DIAGNOSIS — S22000A Wedge compression fracture of unspecified thoracic vertebra, initial encounter for closed fracture: Secondary | ICD-10-CM | POA: Diagnosis present

## 2020-02-20 DIAGNOSIS — G20 Parkinson's disease: Secondary | ICD-10-CM | POA: Diagnosis present

## 2020-02-20 DIAGNOSIS — M545 Low back pain: Secondary | ICD-10-CM | POA: Diagnosis not present

## 2020-02-20 DIAGNOSIS — M2550 Pain in unspecified joint: Secondary | ICD-10-CM | POA: Diagnosis not present

## 2020-02-20 DIAGNOSIS — S22080A Wedge compression fracture of T11-T12 vertebra, initial encounter for closed fracture: Secondary | ICD-10-CM | POA: Diagnosis not present

## 2020-02-20 DIAGNOSIS — S3992XA Unspecified injury of lower back, initial encounter: Secondary | ICD-10-CM | POA: Diagnosis not present

## 2020-02-20 DIAGNOSIS — S22020D Wedge compression fracture of second thoracic vertebra, subsequent encounter for fracture with routine healing: Secondary | ICD-10-CM | POA: Diagnosis not present

## 2020-02-20 DIAGNOSIS — M6281 Muscle weakness (generalized): Secondary | ICD-10-CM | POA: Diagnosis not present

## 2020-02-20 DIAGNOSIS — E878 Other disorders of electrolyte and fluid balance, not elsewhere classified: Secondary | ICD-10-CM | POA: Diagnosis present

## 2020-02-20 DIAGNOSIS — R0902 Hypoxemia: Secondary | ICD-10-CM | POA: Diagnosis not present

## 2020-02-20 DIAGNOSIS — Z7401 Bed confinement status: Secondary | ICD-10-CM | POA: Diagnosis not present

## 2020-02-20 DIAGNOSIS — K5904 Chronic idiopathic constipation: Secondary | ICD-10-CM | POA: Diagnosis not present

## 2020-02-20 DIAGNOSIS — E785 Hyperlipidemia, unspecified: Secondary | ICD-10-CM | POA: Diagnosis not present

## 2020-02-20 DIAGNOSIS — J302 Other seasonal allergic rhinitis: Secondary | ICD-10-CM | POA: Diagnosis not present

## 2020-02-20 DIAGNOSIS — Z87440 Personal history of urinary (tract) infections: Secondary | ICD-10-CM | POA: Diagnosis not present

## 2020-02-20 DIAGNOSIS — Z888 Allergy status to other drugs, medicaments and biological substances status: Secondary | ICD-10-CM

## 2020-02-20 DIAGNOSIS — S0990XA Unspecified injury of head, initial encounter: Secondary | ICD-10-CM | POA: Diagnosis not present

## 2020-02-20 LAB — CBC
HCT: 28.7 % — ABNORMAL LOW (ref 39.0–52.0)
Hemoglobin: 9.7 g/dL — ABNORMAL LOW (ref 13.0–17.0)
MCH: 31 pg (ref 26.0–34.0)
MCHC: 33.8 g/dL (ref 30.0–36.0)
MCV: 91.7 fL (ref 80.0–100.0)
Platelets: 67 10*3/uL — ABNORMAL LOW (ref 150–400)
RBC: 3.13 MIL/uL — ABNORMAL LOW (ref 4.22–5.81)
RDW: 18.6 % — ABNORMAL HIGH (ref 11.5–15.5)
WBC: 2.9 10*3/uL — ABNORMAL LOW (ref 4.0–10.5)
nRBC: 0 % (ref 0.0–0.2)

## 2020-02-20 LAB — BASIC METABOLIC PANEL
Anion gap: 18 — ABNORMAL HIGH (ref 5–15)
Anion gap: 13 (ref 5–15)
BUN: 40 mg/dL — ABNORMAL HIGH (ref 8–23)
BUN: 35 mg/dL — ABNORMAL HIGH (ref 8–23)
CO2: 32 mmol/L (ref 22–32)
CO2: 37 mmol/L — ABNORMAL HIGH (ref 22–32)
Calcium: 9.8 mg/dL (ref 8.9–10.3)
Calcium: 8.6 mg/dL — ABNORMAL LOW (ref 8.9–10.3)
Chloride: 77 mmol/L — ABNORMAL LOW (ref 98–111)
Chloride: 80 mmol/L — ABNORMAL LOW (ref 98–111)
Creatinine, Ser: 1.96 mg/dL — ABNORMAL HIGH (ref 0.61–1.24)
Creatinine, Ser: 1.69 mg/dL — ABNORMAL HIGH (ref 0.61–1.24)
GFR calc Af Amer: 35 mL/min — ABNORMAL LOW (ref >60)
GFR calc Af Amer: 42 mL/min — ABNORMAL LOW (ref >60)
GFR calc non Af Amer: 30 mL/min — ABNORMAL LOW (ref >60)
GFR calc non Af Amer: 36 mL/min — ABNORMAL LOW (ref >60)
Glucose, Bld: 111 mg/dL — ABNORMAL HIGH (ref 70–99)
Glucose, Bld: 123 mg/dL — ABNORMAL HIGH (ref 70–99)
Potassium: <2 mmol/L — CL (ref 3.5–5.1)
Potassium: 2.1 mmol/L — CL (ref 3.5–5.1)
Sodium: 127 mmol/L — ABNORMAL LOW (ref 135–145)
Sodium: 130 mmol/L — ABNORMAL LOW (ref 135–145)

## 2020-02-20 LAB — MAGNESIUM: Magnesium: 2 mg/dL (ref 1.7–2.4)

## 2020-02-20 LAB — URINALYSIS, COMPLETE (UACMP) WITH MICROSCOPIC
Bacteria, UA: NONE SEEN
Bilirubin Urine: NEGATIVE
Glucose, UA: NEGATIVE mg/dL
Ketones, ur: NEGATIVE mg/dL
Leukocytes,Ua: NEGATIVE
Nitrite: NEGATIVE
Protein, ur: NEGATIVE mg/dL
Specific Gravity, Urine: 1.006 (ref 1.005–1.030)
Squamous Epithelial / HPF: NONE SEEN (ref 0–5)
pH: 5 (ref 5.0–8.0)

## 2020-02-20 LAB — TSH: TSH: 0.78 u[IU]/mL (ref 0.350–4.500)

## 2020-02-20 LAB — OSMOLALITY: Osmolality: 276 mOsm/kg (ref 275–295)

## 2020-02-20 LAB — OSMOLALITY, URINE: Osmolality, Ur: 390 mOsm/kg (ref 300–900)

## 2020-02-20 LAB — SODIUM, URINE, RANDOM: Sodium, Ur: 13 mmol/L

## 2020-02-20 LAB — SARS CORONAVIRUS 2 BY RT PCR (HOSPITAL ORDER, PERFORMED IN ~~LOC~~ HOSPITAL LAB): SARS Coronavirus 2: NEGATIVE

## 2020-02-20 MED ORDER — SERTRALINE HCL 50 MG PO TABS
25.0000 mg | ORAL_TABLET | Freq: Every day | ORAL | Status: DC
  Administered 2020-02-23 – 2020-02-24 (×2): 25 mg via ORAL
  Filled 2020-02-20 (×4): qty 1

## 2020-02-20 MED ORDER — POTASSIUM CHLORIDE 2 MEQ/ML IV SOLN
INTRAVENOUS | Status: DC
  Filled 2020-02-20 (×10): qty 1000

## 2020-02-20 MED ORDER — SODIUM CHLORIDE 0.9 % IV BOLUS
1000.0000 mL | Freq: Once | INTRAVENOUS | Status: AC
  Administered 2020-02-20: 1000 mL via INTRAVENOUS

## 2020-02-20 MED ORDER — FINASTERIDE 5 MG PO TABS
5.0000 mg | ORAL_TABLET | Freq: Every day | ORAL | Status: DC
  Administered 2020-02-21 – 2020-02-25 (×5): 5 mg via ORAL
  Filled 2020-02-20 (×5): qty 1

## 2020-02-20 MED ORDER — POTASSIUM CHLORIDE CRYS ER 20 MEQ PO TBCR
40.0000 meq | EXTENDED_RELEASE_TABLET | Freq: Once | ORAL | Status: AC
  Administered 2020-02-20: 40 meq via ORAL

## 2020-02-20 MED ORDER — UMECLIDINIUM-VILANTEROL 62.5-25 MCG/INH IN AEPB
1.0000 | INHALATION_SPRAY | Freq: Every day | RESPIRATORY_TRACT | Status: DC
  Administered 2020-02-21 – 2020-02-25 (×4): 1 via RESPIRATORY_TRACT
  Filled 2020-02-20: qty 14

## 2020-02-20 MED ORDER — ISOSORB DINITRATE-HYDRALAZINE 20-37.5 MG PO TABS
1.0000 | ORAL_TABLET | Freq: Three times a day (TID) | ORAL | Status: DC | PRN
  Filled 2020-02-20: qty 1

## 2020-02-20 MED ORDER — BISACODYL 5 MG PO TBEC: 5.0000 mg | DELAYED_RELEASE_TABLET | Freq: Every day | ORAL | Status: DC | PRN

## 2020-02-20 MED ORDER — NIACIN 500 MG PO TABS
500.0000 mg | ORAL_TABLET | ORAL | Status: DC
  Administered 2020-02-21 – 2020-02-25 (×5): 500 mg via ORAL
  Filled 2020-02-20 (×5): qty 1

## 2020-02-20 MED ORDER — SENNA 8.6 MG PO TABS
1.0000 | ORAL_TABLET | Freq: Two times a day (BID) | ORAL | Status: DC
  Administered 2020-02-20 – 2020-02-25 (×10): 8.6 mg via ORAL
  Filled 2020-02-20 (×10): qty 1

## 2020-02-20 MED ORDER — TAMSULOSIN HCL 0.4 MG PO CAPS
0.4000 mg | ORAL_CAPSULE | Freq: Every day | ORAL | Status: DC
  Administered 2020-02-20 – 2020-02-24 (×4): 0.4 mg via ORAL
  Filled 2020-02-20 (×5): qty 1

## 2020-02-20 MED ORDER — TRAMADOL HCL 50 MG PO TABS
50.0000 mg | ORAL_TABLET | Freq: Three times a day (TID) | ORAL | Status: DC | PRN
  Administered 2020-02-21 – 2020-02-23 (×4): 50 mg via ORAL
  Filled 2020-02-20 (×4): qty 1

## 2020-02-20 MED ORDER — FLUTICASONE-UMECLIDIN-VILANT 100-62.5-25 MCG/INH IN AEPB: 1.0000 | INHALATION_SPRAY | Freq: Every day | RESPIRATORY_TRACT | Status: DC

## 2020-02-20 MED ORDER — PANTOPRAZOLE SODIUM 40 MG PO TBEC
40.0000 mg | DELAYED_RELEASE_TABLET | Freq: Every day | ORAL | Status: DC
  Administered 2020-02-21 – 2020-02-25 (×5): 40 mg via ORAL
  Filled 2020-02-20 (×5): qty 1

## 2020-02-20 MED ORDER — LORATADINE 10 MG PO TABS
10.0000 mg | ORAL_TABLET | Freq: Every day | ORAL | Status: DC
  Administered 2020-02-21 – 2020-02-25 (×5): 10 mg via ORAL
  Filled 2020-02-20 (×5): qty 1

## 2020-02-20 MED ORDER — VITAMIN B-12 1000 MCG PO TABS
1000.0000 ug | ORAL_TABLET | Freq: Every day | ORAL | Status: DC
  Administered 2020-02-21 – 2020-02-25 (×5): 1000 ug via ORAL
  Filled 2020-02-20 (×5): qty 1

## 2020-02-20 MED ORDER — ONDANSETRON HCL 4 MG/2ML IJ SOLN: 4.0000 mg | Freq: Four times a day (QID) | INTRAMUSCULAR | Status: DC | PRN

## 2020-02-20 MED ORDER — ONDANSETRON HCL 4 MG PO TABS: 4.0000 mg | ORAL_TABLET | Freq: Four times a day (QID) | ORAL | Status: DC | PRN

## 2020-02-20 MED ORDER — POTASSIUM CHLORIDE CRYS ER 20 MEQ PO TBCR
40.0000 meq | EXTENDED_RELEASE_TABLET | Freq: Once | ORAL | Status: DC
  Filled 2020-02-20 (×2): qty 2

## 2020-02-20 MED ORDER — IPRATROPIUM-ALBUTEROL 0.5-2.5 (3) MG/3ML IN SOLN: 3.0000 mL | Freq: Four times a day (QID) | RESPIRATORY_TRACT | Status: DC | PRN

## 2020-02-20 MED ORDER — IPRATROPIUM BROMIDE 0.03 % NA SOLN
1.0000 | Freq: Three times a day (TID) | NASAL | Status: DC | PRN
  Filled 2020-02-20: qty 30

## 2020-02-20 MED ORDER — CARBIDOPA-LEVODOPA ER 50-200 MG PO TBCR
1.0000 | EXTENDED_RELEASE_TABLET | Freq: Every day | ORAL | Status: DC
  Administered 2020-02-20 – 2020-02-25 (×21): 1 via ORAL
  Filled 2020-02-20 (×27): qty 1

## 2020-02-20 MED ORDER — CARBIDOPA-LEVODOPA ER 48.75-195 MG PO CPCR: 195.0000 mg | ORAL_CAPSULE | Freq: Every day | ORAL | Status: DC

## 2020-02-20 MED ORDER — LIDOCAINE 5 % EX PTCH
2.0000 | MEDICATED_PATCH | CUTANEOUS | Status: DC
  Administered 2020-02-22 – 2020-02-24 (×3): 2 via TRANSDERMAL
  Filled 2020-02-20 (×6): qty 2

## 2020-02-20 MED ORDER — FLUTICASONE PROPIONATE 50 MCG/ACT NA SUSP
1.0000 | Freq: Every day | NASAL | Status: DC | PRN
  Filled 2020-02-20: qty 16

## 2020-02-20 MED ORDER — MORPHINE SULFATE (PF) 2 MG/ML IV SOLN: 2.0000 mg | INTRAVENOUS | Status: DC | PRN

## 2020-02-20 MED ORDER — ROSUVASTATIN CALCIUM 20 MG PO TABS
20.0000 mg | ORAL_TABLET | Freq: Every day | ORAL | Status: DC
  Administered 2020-02-20 – 2020-02-24 (×5): 20 mg via ORAL
  Filled 2020-02-20: qty 1
  Filled 2020-02-20 (×2): qty 2
  Filled 2020-02-20 (×2): qty 1
  Filled 2020-02-20: qty 2
  Filled 2020-02-20: qty 1
  Filled 2020-02-20: qty 2
  Filled 2020-02-20: qty 1
  Filled 2020-02-20: qty 2
  Filled 2020-02-20: qty 1

## 2020-02-20 MED ORDER — POTASSIUM CHLORIDE 10 MEQ/100ML IV SOLN
10.0000 meq | Freq: Once | INTRAVENOUS | Status: AC
  Administered 2020-02-20: 10 meq via INTRAVENOUS
  Filled 2020-02-20: qty 100

## 2020-02-20 MED ORDER — OXYCODONE HCL 5 MG PO TABS: 5.0000 mg | ORAL_TABLET | ORAL | Status: DC | PRN

## 2020-02-20 NOTE — ED Provider Notes (Signed)
[facility] Emergency Department Provider Note  ____________________________________________  Time seen: Approximately 11:39 AM  I have reviewed the triage vital signs and the nursing notes.   HISTORY  Chief Complaint Fall    HPI Adam Moss is a 86 y.o. male with a past medical history of Parkinson's, multiple myeloma, pancytopenia, hypertension, hyperlipidemia that presents to the emergency department for evaluation after a fall this morning.  Patient was trying to stand and use his urinal when he lost his balance and fell.  He usually has a home health aide come to help him but she was not there this morning.  He is starting to get sore all over.  Patient states that he lives in independent living but feels that he now needs assisted living.  He does have family in the area but they are leaving town tomorrow.  He denies hitting his head or losing consciousness.  Patient states that he falls frequently due to his Parkinson's.  Patient states that he has multiple bruises from his falls.  Patient sees oncology Dr. Grayland Ormond for his multiple myeloma.  No shortness of breath, chest pain, abdominal pain.  Past Medical History:  Diagnosis Date  . Cancer ([place])    skin  . COPD (chronic obstructive pulmonary disease) ([place])   . Hyperlipemia   . Hypertension   . Lymphedema of both lower extremities   . Parkinson disease ([place])   . Parkinsonism ([place]) 02/21/2015  . Spinal stenosis   . Tremor     Patient Active Problem List   Diagnosis Date Noted  . Compression fracture of body of thoracic vertebra ([place]) 02/20/2020  . Hyponatremia 02/20/2020  . Hypokalemia 02/20/2020  . AKI (acute kidney injury) ([place]) 02/20/2020  . Pancytopenia ([place]) 02/20/2020  . Constipation 02/20/2020  . Anasarca 09/17/2019  . Venous insufficiency 09/17/2019  . Chronic bronchitis ([place]) 09/17/2019  . UTI (urinary tract infection) 09/11/2019  . Pain due to onychomycosis of toenails of both  feet 08/09/2019  . Pincer nail deformity 08/09/2019  . BPH (benign prostatic hyperplasia) 08/02/2019  . Healthcare maintenance 07/06/2019  . Mixed hyperlipidemia 04/24/2019  . Chronic obstructive pulmonary disease ([place]) 04/24/2019  . Multiple myeloma ([place]) 04/24/2019  . Other lymphedema 04/24/2019  . Edema 04/24/2019  . Essential hypertension 04/24/2019  . Lower extremity edema 04/02/2019  . Pathologic fracture 03/30/2019  . Cough 12/04/2015  . Non-seasonal allergic rhinitis 12/04/2015  . Parkinson disease ([place]) 02/21/2015  . Spinal stenosis in cervical region 11/30/2013  . Tremor 11/30/2013  . Patient risk and functional assessment 04/14/2012  . History of actinic keratoses 10/02/2010  . History of nonmelanoma skin cancer 10/02/2010  . Chest pain, unspecified 03/28/2007    Past Surgical History:  Procedure Laterality Date  . APPENDECTOMY    . CATARACT EXTRACTION    . CHOLECYSTECTOMY    . TONSILLECTOMY    . VASECTOMY      Prior to Admission medications   Medication Sig Start Date End Date Taking? Authorizing Provider  aspirin 81 MG tablet Take 81 mg by mouth daily.   Yes [provider]  Calcium Carbonate-Vitamin D (OYSTER SHELL CALCIUM/D) 250-125 MG-UNIT TABS Take 1 tablet by mouth daily.    Yes [provider]  Calcium Citrate 250 MG TABS Take 1 tablet by mouth daily.    Yes [provider]  Carbidopa-Levodopa ER (RYTARY) 48.75-195 MG CPCR Take 195 mg by mouth 5 (five) times daily. 08/21/19  Yes Star Age, MD  cetirizine (ZYRTEC) 10 MG tablet  Take 10 mg by mouth daily.   Yes [provider]  finasteride (PROSCAR) 5 MG tablet Take 1 tablet (5 mg total) by mouth daily. 01/04/20  Yes Billey Co, MD  Fluticasone-Umeclidin-Vilant (TRELEGY ELLIPTA) 100-62.5-25 MCG/INH AEPB Inhale 1 spray into the lungs daily.  08/02/19  Yes [provider]  furosemide (LASIX) 20 MG tablet Take 40 mg by mouth 2 (two) times daily.  05/06/19  Yes  [provider]  KLOR-CON M20 20 MEQ tablet TAKE 1 TABLET BY MOUTH TWICE A DAY Patient taking differently: Take 20 mEq by mouth 2 (two) times daily.  12/06/19  Yes Lloyd Huger, MD  lenalidomide (REVLIMID) 20 MG capsule Take 1 capsule (20 mg total) by mouth daily. Take for 21 days then hold for 7 days. Repeat every 28 days. 01/31/20  Yes Lloyd Huger, MD  niacin 500 MG tablet Take 500 mg by mouth every morning.    Yes [provider]  omeprazole (PRILOSEC) 40 MG capsule Take 40 mg by mouth daily.   Yes [provider]  rosuvastatin (CRESTOR) 20 MG tablet Take 20 mg by mouth at bedtime. 10/12/19  Yes [provider]  sertraline (ZOLOFT) 25 MG tablet Take 25 mg by mouth at bedtime.  10/29/19  Yes [provider]  spironolactone (ALDACTONE) 25 MG tablet Take 25 mg by mouth daily. 10/12/19  Yes [provider]  tamsulosin (FLOMAX) 0.4 MG CAPS capsule Take 1 capsule (0.4 mg total) by mouth daily. 01/04/20  Yes Billey Co, MD  vitamin B-12 (CYANOCOBALAMIN) 1000 MCG tablet Take 1,000 mcg by mouth daily.   Yes [provider]  fluticasone (FLONASE) 50 MCG/ACT nasal spray Place 1 spray into both nostrils daily as needed for allergies.     [provider]  ipratropium (ATROVENT) 0.03 % nasal spray Place 0.03 mLs into both nostrils 3 (three) times daily as needed. 05/25/17   [provider]  meloxicam (MOBIC) 15 MG tablet Take 1 tablet (15 mg total) by mouth daily as needed for pain. 09/13/19   Enzo Bi, MD  prochlorperazine (COMPAZINE) 10 MG tablet Take 1 tablet (10 mg total) by mouth every 6 (six) hours as needed for nausea or vomiting. Patient not taking: Reported on 02/20/2020 10/30/19   Lloyd Huger, MD    Allergies Carbidopa-levodopa, Oxycodone-acetaminophen, Tyloxapol, Acetaminophen, and Pramipexole  Family History  Problem Relation Age of Onset  . Cancer Mother   . Heart disease Father     Social  History Social History   Tobacco Use  . Smoking status: Never Smoker  . Smokeless tobacco: Never Used  Vaping Use  . Vaping Use: Never used  Substance Use Topics  . Alcohol use: No    Alcohol/week: 0.0 standard drinks  . Drug use: No     Review of Systems  Constitutional: No fever/chills Cardiovascular: No chest pain. Respiratory: No SOB. Gastrointestinal: No abdominal pain.  No vomiting.  Musculoskeletal: Positive for back pain. Skin: Negative for rash, lacerations, ecchymosis.  Small skin tear to right forearm. Neurological: Negative for headaches   ____________________________________________   PHYSICAL EXAM:  VITAL SIGNS: ED Triage Vitals  Enc Vitals Group     BP 02/20/20 0851 100/70     Pulse Rate 02/20/20 0851 81     Resp 02/20/20 0851 16     Temp 02/20/20 0851 98.1 F (36.7 C)     Temp Source 02/20/20 0851 Oral     SpO2 02/20/20 0851 99 %  Weight 02/20/20 0850 164 lb 14.5 oz (74.8 kg)     Height 02/20/20 0850 '5\' 10"'  (1.778 m)     Head Circumference --      Peak Flow --      Pain Score 02/20/20 0850 0     Pain Loc --      Pain Edu? --      Excl. in [place]? --      Constitutional: Alert and oriented. Well appearing and in no acute distress. Eyes: Conjunctivae are normal. PERRL. EOMI. Head: Atraumatic. ENT:      Ears:      Nose: No congestion/rhinnorhea.      Mouth/Throat: Mucous membranes are moist.  Neck: No stridor. No cervical spine tenderness to palpation. Cardiovascular: Normal rate, regular rhythm.  Good peripheral circulation. Respiratory: Normal respiratory effort without tachypnea or retractions. Lungs CTAB. Good air entry to the bases with no decreased or absent breath sounds. Gastrointestinal: Bowel sounds 4 quadrants. Soft and nontender to palpation. No guarding or rigidity. No palpable masses. No distention.  Musculoskeletal: Full range of motion to all extremities. No gross deformities appreciated.  No tenderness to palpation over  lumbar or thoracic spine.  Tenderness to palpation throughout thoracic and lumbar paraspinal muscles.  No tenderness to palpation to bilateral hips.  Full range of motion of bilateral hips. Neurologic:  Normal speech and language. No gross focal neurologic deficits are appreciated.  Skin:  Skin is warm, dry.  1 cm skin tear to right forearm. Psychiatric: Mood and affect are normal. Speech and behavior are normal. Patient exhibits appropriate insight and judgement.   ____________________________________________   LABS (all labs ordered are listed, but only abnormal results are displayed)  Labs Reviewed  URINALYSIS, COMPLETE (UACMP) WITH MICROSCOPIC - Abnormal; Notable for the following components:      Result Value   Color, Urine STRAW (*)    APPearance CLEAR (*)    Hgb urine dipstick SMALL (*)    All other components within normal limits  CBC - Abnormal; Notable for the following components:   WBC 2.9 (*)    RBC 3.13 (*)    Hemoglobin 9.7 (*)    HCT 28.7 (*)    RDW 18.6 (*)    Platelets 67 (*)    All other components within normal limits  BASIC METABOLIC PANEL - Abnormal; Notable for the following components:   Sodium 127 (*)    Potassium <2.0 (*)    Chloride 77 (*)    Glucose, Bld 111 (*)    BUN 40 (*)    Creatinine, Ser 1.96 (*)    GFR calc non Af Amer 30 (*)    GFR calc Af Amer 35 (*)    Anion gap 18 (*)    All other components within normal limits  SARS CORONAVIRUS 2 BY RT PCR (HOSPITAL ORDER, [facility])  MAGNESIUM  OSMOLALITY  OSMOLALITY, URINE  SODIUM, URINE, RANDOM  BASIC METABOLIC PANEL  TSH  CBC  BASIC METABOLIC PANEL  MAGNESIUM  CORTISOL  URIC ACID   ____________________________________________  EKG   ____________________________________________  RADIOLOGY   DG Chest 2 View  Result Date: 02/20/2020 CLINICAL DATA:  [place] today with back pain EXAM: CHEST - 2 VIEW COMPARISON:  09/10/2019 FINDINGS: Heart size upper limits  of normal. Tortuous aorta as seen previously. The lungs are clear. No infiltrate, collapse, edema or effusion. Old right clavicle fracture. Compression fracture of T12 is visible as shown at lumbar radiography. IMPRESSION: No  active cardiopulmonary disease. T12 compression fracture visible, better shown at lumbar radiography. Old right clavicle fracture. Electronically Signed   By: Nelson Chimes M.D.   On: 02/20/2020 13:59   DG Lumbar Spine Complete  Result Date: 02/20/2020 CLINICAL DATA:  Pain following fall EXAM: LUMBAR SPINE - COMPLETE 4+ VIEW COMPARISON:  CT abdomen and pelvis including bony reformats April 17, 2019 FINDINGS: Frontal, lateral, spot lumbosacral lateral, and bilateral oblique views were obtained. There are 5 non-rib-bearing lumbar type vertebral bodies. There is lumbar dextroscoliosis. There is moderate wedging of the T12 vertebral body which was not present previously and may well be acute. There is more subtle anterior wedging of the L2 vertebral body. No other fractures are appreciable. There is 2 mm of anterolisthesis of L3 on L4, stable. No new spondylolisthesis. There is moderately severe disc space narrowing at L3-4, L4-5, and L5-S1, also present previously. There is facet osteoarthritic change at L3-4, L4-5, and L5-S1 bilaterally. IMPRESSION: Scoliosis. Probable acute anterior wedge fractures at T12 and L2, more severe at T12. Stable slight anterolisthesis of L3 on L4. No new spondylolisthesis. Multilevel arthropathy, most severe at L3-4, L4-5, L5-S1, also noted on previous CT examination. Electronically Signed   By: Lowella Grip III M.D.   On: 02/20/2020 12:46   DG Pelvis 1-2 Views  Result Date: 02/20/2020 CLINICAL DATA:  Pain following fall EXAM: PELVIS - 1-2 VIEW COMPARISON:  None. FINDINGS: There is no evidence of pelvic fracture or dislocation. There is moderate symmetric narrowing of each hip joint. No erosive change. IMPRESSION: Symmetric narrowing each hip joint.   No fracture or dislocation. Electronically Signed   By: Lowella Grip III M.D.   On: 02/20/2020 12:47   CT Head Wo Contrast  Result Date: 02/20/2020 CLINICAL DATA:  Head trauma history of Parkinson's EXAM: CT HEAD WITHOUT CONTRAST CT CERVICAL SPINE WITHOUT CONTRAST TECHNIQUE: Multidetector CT imaging of the head and cervical spine was performed following the standard protocol without intravenous contrast. Multiplanar CT image reconstructions of the cervical spine were also generated. COMPARISON:  None FINDINGS: CT HEAD FINDINGS Brain: No evidence of acute infarction, hemorrhage, hydrocephalus, extra-axial collection or mass lesion/mass effect. Signs of atrophy and chronic microvascular ischemic changes in the deep white matter. Vascular: No hyperdense vessel or unexpected calcification. Skull: Normal. Negative for fracture or focal lesion. Sinuses/Orbits: Visualized paranasal sinuses and orbits are unremarkable. Other: None. CT CERVICAL SPINE FINDINGS Alignment: Mild reversal of normal cervical lordosis in the setting of multilevel degenerative change. Degenerative changes greatest in the mid to upper cervical spine. Skull base and vertebrae: No signs of fracture of the cervical spine or skull base, cranial cervical junction. Signs of pathologic fracture, of the T2 vertebral body. Cortical margins suggest acute process. No retropulsion of fracture elements with fracture extending through the posterior vertebral body, associated with lucent lesion, likely metastatic disease in this location. Lucent area seen in this location on previous imaging evaluation. Soft tissues and spinal canal: No prevertebral fluid or swelling. No visible canal hematoma. Disc levels: Multilevel degenerative changes greatest at with disc space loss, near complete at C3-4, C4-5, C5-6 and C6-7. Facet arthropathy noted throughout the cervical spine greatest at C3-4 on the RIGHT Upper chest: Lung apices are clear. Subacute to chronic  RIGHT-sided first rib fracture. Other: None IMPRESSION: 1. No acute intracranial abnormality. 2. Signs of atrophy and chronic microvascular ischemic changes. 3. Pathologic fracture of T2 likely acute, through presumed metastatic focus in the T2 vertebral body. No additional fractures noted in the  visualized portion of the thoracic spine or within the cervical spine. 4. Multilevel degenerative changes of the cervical spine. 5. Subacute to chronic RIGHT-sided first rib fracture. Electronically Signed   By: Zetta Bills M.D.   On: 02/20/2020 11:56   CT Cervical Spine Wo Contrast  Result Date: 02/20/2020 CLINICAL DATA:  Head trauma history of Parkinson's EXAM: CT HEAD WITHOUT CONTRAST CT CERVICAL SPINE WITHOUT CONTRAST TECHNIQUE: Multidetector CT imaging of the head and cervical spine was performed following the standard protocol without intravenous contrast. Multiplanar CT image reconstructions of the cervical spine were also generated. COMPARISON:  None FINDINGS: CT HEAD FINDINGS Brain: No evidence of acute infarction, hemorrhage, hydrocephalus, extra-axial collection or mass lesion/mass effect. Signs of atrophy and chronic microvascular ischemic changes in the deep white matter. Vascular: No hyperdense vessel or unexpected calcification. Skull: Normal. Negative for fracture or focal lesion. Sinuses/Orbits: Visualized paranasal sinuses and orbits are unremarkable. Other: None. CT CERVICAL SPINE FINDINGS Alignment: Mild reversal of normal cervical lordosis in the setting of multilevel degenerative change. Degenerative changes greatest in the mid to upper cervical spine. Skull base and vertebrae: No signs of fracture of the cervical spine or skull base, cranial cervical junction. Signs of pathologic fracture, of the T2 vertebral body. Cortical margins suggest acute process. No retropulsion of fracture elements with fracture extending through the posterior vertebral body, associated with lucent lesion, likely  metastatic disease in this location. Lucent area seen in this location on previous imaging evaluation. Soft tissues and spinal canal: No prevertebral fluid or swelling. No visible canal hematoma. Disc levels: Multilevel degenerative changes greatest at with disc space loss, near complete at C3-4, C4-5, C5-6 and C6-7. Facet arthropathy noted throughout the cervical spine greatest at C3-4 on the RIGHT Upper chest: Lung apices are clear. Subacute to chronic RIGHT-sided first rib fracture. Other: None IMPRESSION: 1. No acute intracranial abnormality. 2. Signs of atrophy and chronic microvascular ischemic changes. 3. Pathologic fracture of T2 likely acute, through presumed metastatic focus in the T2 vertebral body. No additional fractures noted in the visualized portion of the thoracic spine or within the cervical spine. 4. Multilevel degenerative changes of the cervical spine. 5. Subacute to chronic RIGHT-sided first rib fracture. Electronically Signed   By: Zetta Bills M.D.   On: 02/20/2020 11:56   CT Thoracic Spine Wo Contrast  Result Date: 02/20/2020 CLINICAL DATA:  Mid to low back pain post fall. EXAM: CT THORACIC AND LUMBAR SPINE WITHOUT CONTRAST TECHNIQUE: Multidetector CT imaging of the thoracic and lumbar spine was performed without contrast. Multiplanar CT image reconstructions were also generated. COMPARISON:  Lumbar spine radiographs today. Cervical spine CT today, chest CT 04/01/2019 and abdominopelvic CT 04/17/2019 FINDINGS: CT THORACIC SPINE FINDINGS Alignment: Mildly progressive upper thoracic kyphosis without focal angulation or significant listhesis. Vertebrae: As seen on today's cervical spine CT, there is a mild fracture of the T2 vertebral body resulting in less than 25% loss of vertebral body height and no osseous retropulsion. This fracture appears pathologic with an underlying lytic lesion as seen on prior chest CT. There is also an acute nearly horizontal fracture through the T12 vertebral  body, resulting in 25% loss of vertebral body height and 4 mm of osseous retropulsion. This fracture does not show any definite pathologic features. Nonacute fractures of the right clavicle and right 1st rib anteriorly are noted. Possible lytic lesion and pathologic fracture of the left 7th rib near the costovertebral junction (image 64/2). Additional lytic lesions are noted within the T5 and T8  vertebral bodies, suspicious for metastatic disease or myeloma. Paraspinal and other soft tissues: No significant paraspinal hematoma. Mild aortic and branch vessel atherosclerosis. Emphysema and bibasilar atelectasis noted. Disc levels: Relatively mild multilevel thoracic spondylosis with disc space narrowing and anterior osteophytes. There is a small right foraminal disc protrusion at T6-7. No high-grade foraminal narrowing. CT LUMBAR SPINE FINDINGS Segmentation: There are 5 lumbar type vertebral bodies. Alignment: Grade 1 degenerative anterolisthesis at L3-4 and minimal convex right scoliosis. Vertebrae: Lytic lesions are again noted within the L2 vertebral body and upper sacrum, the latter measuring up to 3.1 cm. These have sclerotic margins and may be partially treated. Other lytic lesions are present within the right aspect of the sacrum. These were all present and described on previous abdominal CT. There is a pathologic fracture of the L2 vertebral body with 30% loss of vertebral body height, asymmetric to the left. This fracture does not appear acute. No definite acute fractures in the lumbar spine. Paraspinal and other soft tissues: No enlarging paraspinal soft tissue masses or hematomas. Mild aortic and branch vessel atherosclerosis. Disc levels: L1-2: Progressive loss of disc height with a new extruded left paracentral disc fragment containing vacuum phenomenon extending inferiorly behind the L2 vertebral body. This exerts mass effect on the thecal sac and may contribute to left-sided nerve root encroachment.  Facet and ligamentous hypertrophy contribute to mild left foraminal narrowing. L2-3: Chronic degenerative disc disease with loss of disc height and endplate osteophytes asymmetric to the left. Mild facet and ligamentous hypertrophy. Mild spinal stenosis with mild asymmetric narrowing of the left lateral recess and left foramen. L3-4: Chronic degenerative disc disease with loss of disc height, endplate osteophytes and moderate facet hypertrophy. Resulting grade 1 anterolisthesis contributes to moderate multifactorial spinal stenosis, moderate lateral recess and mild foraminal narrowing bilaterally. L4-5: Probable previous posterior decompression. Chronic loss of disc height with annular disc bulging and endplate osteophytes asymmetric to the right. Mild right foraminal narrowing. L5-S1: Probable posterior decompression. Mild disc bulging and endplate osteophytes asymmetric to the left. Bilateral facet hypertrophy, worse on the right. Mild left foraminal narrowing. IMPRESSION: 1. Acute fractures of the T2 and T12 vertebral bodies, as described. The T2 fracture appears pathologic. 2. Interval pathologic fracture of the L2 vertebral body compared with CTs from September. This fracture does not appear acute. No definite acute fractures in the lumbar spine. 3. Multiple lytic lesions are again noted throughout the thoracolumbar spine and sacrum consistent with multiple myeloma or metastatic disease. Correlate with previous biopsy results. 4. Progressive degenerative changes at L1-2 with a new extruded disc fragment containing vacuum phenomenon extending inferiorly behind the L2 vertebral body on the left. This may contribute to left-sided nerve root encroachment. 5. Aortic Atherosclerosis (ICD10-I70.0) and Emphysema (ICD10-J43.9). Electronically Signed   By: Richardean Sale M.D.   On: 02/20/2020 14:13   CT Lumbar Spine Wo Contrast  Result Date: 02/20/2020 CLINICAL DATA:  Mid to low back pain post fall. EXAM: CT  THORACIC AND LUMBAR SPINE WITHOUT CONTRAST TECHNIQUE: Multidetector CT imaging of the thoracic and lumbar spine was performed without contrast. Multiplanar CT image reconstructions were also generated. COMPARISON:  Lumbar spine radiographs today. Cervical spine CT today, chest CT 04/01/2019 and abdominopelvic CT 04/17/2019 FINDINGS: CT THORACIC SPINE FINDINGS Alignment: Mildly progressive upper thoracic kyphosis without focal angulation or significant listhesis. Vertebrae: As seen on today's cervical spine CT, there is a mild fracture of the T2 vertebral body resulting in less than 25% loss of vertebral body height and no  osseous retropulsion. This fracture appears pathologic with an underlying lytic lesion as seen on prior chest CT. There is also an acute nearly horizontal fracture through the T12 vertebral body, resulting in 25% loss of vertebral body height and 4 mm of osseous retropulsion. This fracture does not show any definite pathologic features. Nonacute fractures of the right clavicle and right 1st rib anteriorly are noted. Possible lytic lesion and pathologic fracture of the left 7th rib near the costovertebral junction (image 64/2). Additional lytic lesions are noted within the T5 and T8 vertebral bodies, suspicious for metastatic disease or myeloma. Paraspinal and other soft tissues: No significant paraspinal hematoma. Mild aortic and branch vessel atherosclerosis. Emphysema and bibasilar atelectasis noted. Disc levels: Relatively mild multilevel thoracic spondylosis with disc space narrowing and anterior osteophytes. There is a small right foraminal disc protrusion at T6-7. No high-grade foraminal narrowing. CT LUMBAR SPINE FINDINGS Segmentation: There are 5 lumbar type vertebral bodies. Alignment: Grade 1 degenerative anterolisthesis at L3-4 and minimal convex right scoliosis. Vertebrae: Lytic lesions are again noted within the L2 vertebral body and upper sacrum, the latter measuring up to 3.1 cm.  These have sclerotic margins and may be partially treated. Other lytic lesions are present within the right aspect of the sacrum. These were all present and described on previous abdominal CT. There is a pathologic fracture of the L2 vertebral body with 30% loss of vertebral body height, asymmetric to the left. This fracture does not appear acute. No definite acute fractures in the lumbar spine. Paraspinal and other soft tissues: No enlarging paraspinal soft tissue masses or hematomas. Mild aortic and branch vessel atherosclerosis. Disc levels: L1-2: Progressive loss of disc height with a new extruded left paracentral disc fragment containing vacuum phenomenon extending inferiorly behind the L2 vertebral body. This exerts mass effect on the thecal sac and may contribute to left-sided nerve root encroachment. Facet and ligamentous hypertrophy contribute to mild left foraminal narrowing. L2-3: Chronic degenerative disc disease with loss of disc height and endplate osteophytes asymmetric to the left. Mild facet and ligamentous hypertrophy. Mild spinal stenosis with mild asymmetric narrowing of the left lateral recess and left foramen. L3-4: Chronic degenerative disc disease with loss of disc height, endplate osteophytes and moderate facet hypertrophy. Resulting grade 1 anterolisthesis contributes to moderate multifactorial spinal stenosis, moderate lateral recess and mild foraminal narrowing bilaterally. L4-5: Probable previous posterior decompression. Chronic loss of disc height with annular disc bulging and endplate osteophytes asymmetric to the right. Mild right foraminal narrowing. L5-S1: Probable posterior decompression. Mild disc bulging and endplate osteophytes asymmetric to the left. Bilateral facet hypertrophy, worse on the right. Mild left foraminal narrowing. IMPRESSION: 1. Acute fractures of the T2 and T12 vertebral bodies, as described. The T2 fracture appears pathologic. 2. Interval pathologic fracture of  the L2 vertebral body compared with CTs from September. This fracture does not appear acute. No definite acute fractures in the lumbar spine. 3. Multiple lytic lesions are again noted throughout the thoracolumbar spine and sacrum consistent with multiple myeloma or metastatic disease. Correlate with previous biopsy results. 4. Progressive degenerative changes at L1-2 with a new extruded disc fragment containing vacuum phenomenon extending inferiorly behind the L2 vertebral body on the left. This may contribute to left-sided nerve root encroachment. 5. Aortic Atherosclerosis (ICD10-I70.0) and Emphysema (ICD10-J43.9). Electronically Signed   By: Richardean Sale M.D.   On: 02/20/2020 14:13    ____________________________________________    PROCEDURES  Procedure(s) performed:    Procedures  CRITICAL CARE Performed by: Caryl Pina  Roniel Halloran   Total critical care time: 30 minutes  Critical care time was exclusive of separately billable procedures and treating other patients.  Critical care was necessary to treat or prevent imminent or life-threatening deterioration.  Critical care was time spent personally by me on the following activities: development of treatment plan with patient and/or surrogate as well as nursing, discussions with consultants, evaluation of patient's response to treatment, examination of patient, obtaining history from patient or surrogate, ordering and performing treatments and interventions, ordering and review of laboratory studies, ordering and review of radiographic studies, pulse oximetry and re-evaluation of patient's condition.  ____________________________________________   INITIAL IMPRESSION / ASSESSMENT AND PLAN / ED COURSE  Pertinent labs & imaging results that were available during my care of the patient were reviewed by me and considered in my medical decision making (see chart for details).  Review of the [place] CSRS was performed in accordance of the [place] prior to  dispensing any controlled drugs.   Patient presented to emergency department for evaluation after fall.  Labs are remarkable for white blood cell count 2.9, hemoglobin 9.7, hematocrit 28.7, platelets 67, sodium 127, potassium less than 2, chloride 77, BUN 40, creatinine 1.96, GFR 30 and anion gap of 18.  Patient has a history of neutropenia, anemia, thrombocytopenia and follows Dr. Grayland Ormond for these.  Patient was given fluids for sodium 127, chloride 77.  His potassium was supplemented with oral and IV potassium.  CT scans remarkable for a new compression fracture less than 25% at T2 and T12.  Patient is unable to walk or bear weight without assistance.  Patient will be admitted to the hospital for anemia, hypokalemia, AKI, compression fracture.  Hospitalist is agreeable with admission.   Adam Moss was evaluated in Emergency Department on 02/20/2020 for the symptoms described in the history of present illness. He was evaluated in the context of the global COVID-19 pandemic, which necessitated consideration that the patient might be at risk for infection with the SARS-CoV-2 virus that causes COVID-19. Institutional protocols and algorithms that pertain to the evaluation of patients at risk for COVID-19 are in a state of rapid change based on information released by regulatory bodies including the CDC and federal and state organizations. These policies and algorithms were followed during the patient's care in the ED.  ____________________________________________  FINAL CLINICAL IMPRESSION(S) / ED DIAGNOSES  Final diagnoses:  Hypokalemia  Compression fracture of body of thoracic vertebra (HCC)  AKI (acute kidney injury) ([place])  Anemia, unspecified type      NEW MEDICATIONS STARTED DURING THIS VISIT:  ED Discharge Orders    None          This chart was dictated using voice recognition software/Dragon. Despite best efforts to proofread, errors can occur which can change the meaning.  Any change was purely unintentional.    Laban Emperor, PA-C 02/20/20 [id number]    Lavonia Drafts, MD 02/24/20 [phone]

## 2020-02-20 NOTE — ED Triage Notes (Signed)
States lives in independent living at the [facility].  Usually walks with a walker.

## 2020-02-20 NOTE — ED Notes (Signed)
Called son and daughter in law and gave update--anticipate admission.

## 2020-02-20 NOTE — Telephone Encounter (Signed)
Daughter in law [name] called reporting that patient fell and is in the ER and will be admitted. He has an appointment Friday that he probably will not be able to keep

## 2020-02-20 NOTE — H&P (Signed)
History and Physical    Adam Moss CEY:[id number] DOB: 28-Aug-1933 DOA: 02/20/2020  PCP: Perrin Maltese, MD  Patient coming from: home  I have personally briefly reviewed patient's old medical records in [place]  Chief Complaint: fall, back pain  HPI: Adam Moss is a 86 y.o. male with medical history of multiple myeloma, Parkinson's disease, hypertension, hyperlipidemia, COPD, chronic lower extremity edema with venous insufficiency who presented from home today after mechanical fall.  Patient lives independently and has home health aide available.  This morning, he states he was attempting to get up to empty his urinal when he fell backwards.  He denies any preceding symptoms such as dizziness, lightheadedness, palpitations or chest pain.  He denies any head or neck injury or loss of consciousness.  He currently denies any pain, says his back only hurts when he was standing or trying to move around.  Does not have pain when he is at rest.  He otherwise reports being in his usual state of health recently without any acute illnesses.  He denies specifically any recent fevers or chills, dysuria, nausea vomiting or diarrhea, abdominal pain, cough, sore throat or congestion.  Denies seeing any blood in his urine or stool.  Denies any unilateral weakness numbness or tingling.  Of note, patient reports that he thinks it is time for him to go to assisted living or skilled nursing due to the amount of assistance he requires.  ED Course: Vitals were normal.  CBC showed pancytopenia with WBC 2.9, Hbg 9.7, platelets 67 (all cell lines down slightly from labs on 6/29).  BMP notable for hyponatremia 127, hypokalemia <2.0, hypochloremia 77, BUN 40, creatinine 1.96 (compared to 0.79 in February).  UA was negative.  CT of head and C-spine were negative for acute findings.  CT of thoracic spine showed acute compression fractures of T2 and T12, in addition to a chronic first right rib fracture.   Lumbar CT showed an L2 compression fracture, nonacute but new since September.  Chest x-ray negative for acute cardiopulmonary findings but did show old fractures.  Patient received bolus of normal saline and started on potassium replacement in the ED.  Admitted to hospitalist service for further evaluation and management.  Review of Systems: As per HPI otherwise 10 point review of systems negative.    Past Medical History:  Diagnosis Date  . Cancer ([place])    skin  . COPD (chronic obstructive pulmonary disease) ([place])   . Hyperlipemia   . Hypertension   . Lymphedema of both lower extremities   . Parkinson disease ([place])   . Parkinsonism ([place]) 02/21/2015  . Spinal stenosis   . Tremor     Past Surgical History:  Procedure Laterality Date  . APPENDECTOMY    . CATARACT EXTRACTION    . CHOLECYSTECTOMY    . TONSILLECTOMY    . VASECTOMY       reports that he has never smoked. He has never used smokeless tobacco. He reports that he does not drink alcohol and does not use drugs.  Allergies  Allergen Reactions  . Carbidopa-Levodopa Other (See Comments)    Severe stomach pains, can take rytary  . Oxycodone-Acetaminophen Other (See Comments)    "boils on skin"  . Tyloxapol   . Acetaminophen Rash, Nausea And Vomiting and Hives  . Pramipexole Nausea Only    Family History  Problem Relation Age of Onset  . Cancer Mother   . Heart disease Father  Prior to Admission medications   Medication Sig Start Date End Date Taking? Authorizing Provider  aspirin 81 MG tablet Take 81 mg by mouth daily.    [provider]  Calcium Carbonate-Vitamin D (OYSTER SHELL CALCIUM/D) 250-125 MG-UNIT TABS Take by mouth.    [provider]  Calcium Citrate 250 MG TABS Take 1 tablet by mouth daily.     [provider]  Carbidopa-Levodopa ER (RYTARY) 48.75-195 MG CPCR Take 195 mg by mouth 5 (five) times daily. 08/21/19   Star Age, MD  cetirizine (ZYRTEC) 10 MG tablet Take  10 mg by mouth daily.    [provider]  enalapril (VASOTEC) 2.5 MG tablet Take 2.5 mg by mouth daily. Patient not taking: Reported on 01/21/2020 11/12/19   [provider]  finasteride (PROSCAR) 5 MG tablet Take 1 tablet (5 mg total) by mouth daily. 01/04/20   Billey Co, MD  fluticasone (FLONASE) 50 MCG/ACT nasal spray Place into both nostrils daily.    [provider]  Fluticasone-Umeclidin-Vilant (TRELEGY ELLIPTA) 100-62.5-25 MCG/INH AEPB Trelegy Ellipta 100-62.5-25 MCG/INH Inhalation Aerosol Powder Breath Activated QTY: 3 inhaler Days: 90 Refills: 3  Written: 08/02/19 Patient Instructions: inhale 1 puff by mouth once daily. Rinse mouth well after use. 08/02/19   [provider]  furosemide (LASIX) 20 MG tablet Take 40 mg by mouth daily.  05/06/19   [provider]  ipratropium (ATROVENT) 0.03 % nasal spray Place 0.03 mLs into both nostrils 3 (three) times daily as needed. 05/25/17   [provider]  KLOR-CON M20 20 MEQ tablet TAKE 1 TABLET BY MOUTH TWICE A DAY Patient not taking: Reported on 01/21/2020 12/06/19   Lloyd Huger, MD  lenalidomide (REVLIMID) 20 MG capsule Take 1 capsule (20 mg total) by mouth daily. Take for 21 days then hold for 7 days. Repeat every 28 days. 01/31/20   Lloyd Huger, MD  meloxicam (MOBIC) 15 MG tablet Take 1 tablet (15 mg total) by mouth daily as needed for pain. 09/13/19   Enzo Bi, MD  niacin 500 MG tablet Take 500 mg by mouth every morning.     [provider]  omeprazole (PRILOSEC) 40 MG capsule Take 40 mg by mouth daily.    [provider]  prochlorperazine (COMPAZINE) 10 MG tablet Take 1 tablet (10 mg total) by mouth every 6 (six) hours as needed for nausea or vomiting. 10/30/19   Lloyd Huger, MD  rosuvastatin (CRESTOR) 20 MG tablet Take 20 mg by mouth at bedtime. 10/12/19   [provider]  sertraline (ZOLOFT) 25 MG tablet Take 25 mg by mouth daily. Patient not  taking: Reported on 01/21/2020 10/29/19   [provider]  Simethicone (GAS RELIEF PO) Take 1 Dose by mouth as needed.     [provider]  spironolactone (ALDACTONE) 25 MG tablet Take 25 mg by mouth daily. 10/12/19   [provider]  tamsulosin (FLOMAX) 0.4 MG CAPS capsule Take 1 capsule (0.4 mg total) by mouth daily. 01/04/20   Billey Co, MD  vitamin B-12 (CYANOCOBALAMIN) 1000 MCG tablet Take 1,000 mcg by mouth daily.    [provider]  vitamin C (ASCORBIC ACID) 500 MG tablet Take 500 mg by mouth daily. Patient not taking: Reported on 01/21/2020    [provider]    Physical Exam: Vitals:   02/20/20 0850 02/20/20 0851 02/20/20 1546  BP:  100/70 100/66  Pulse:  81 70  Resp:  16 16  Temp:  98.1 F (36.7 C)   TempSrc:  Oral   SpO2:  99% 100%  Weight: 74.8 kg    Height: _0  (1.778 m)       Constitutional: NAD, calm, comfortable Eyes: Small pupils equal and reactive, EOMI, lids and conjunctivae normal ENMT: Mucous membranes are dry. Normal dentition.  Hearing grossly normal. Respiratory: CTAB, no wheezing, no crackles. Normal respiratory effort. No accessory muscle use.  Cardiovascular: RRR, no murmurs / rubs / gallops.  3+ pitting lower extremity edema.   Abdomen: soft, NT, ND, no masses or HSM palpated. +Bowel sounds.  Musculoskeletal: no clubbing / cyanosis. No joint deformity upper and lower extremities. Normal muscle tone.  Patchy ecchymosis on bilateral upper extremities. Skin: dry, intact, normal temperature, scattered patches of ecchymosis Neurologic: CN 2-12 grossly intact. Normal speech.  Grossly non-focal exam. Psychiatric: Alert and oriented x 3. Normal mood. Congruent affect.  Normal judgement and insight.    Labs on Admission: I have personally reviewed following labs and imaging studies  CBC: Recent Labs  Lab 02/20/20 1316  WBC 2.9*  HGB 9.7*  HCT 28.7*  MCV 91.7  PLT 67*   Basic Metabolic Panel: Recent  Labs  Lab 02/20/20 1316  NA 127*  K <2.0*  CL 77*  CO2 32  GLUCOSE 111*  BUN 40*  CREATININE 1.96*  CALCIUM 9.8  MG 2.0   GFR: Estimated Creatinine Clearance: 29 mL/min (A) (by C-G formula based on SCr of 1.96 mg/dL (H)). Liver Function Tests: No results for input(s): AST, ALT, ALKPHOS, BILITOT, PROT, ALBUMIN in the last 168 hours. No results for input(s): LIPASE, AMYLASE in the last 168 hours. No results for input(s): AMMONIA in the last 168 hours. Coagulation Profile: No results for input(s): INR, PROTIME in the last 168 hours. Cardiac Enzymes: No results for input(s): CKTOTAL, CKMB, CKMBINDEX, TROPONINI in the last 168 hours. BNP (last 3 results) No results for input(s): PROBNP in the last 8760 hours. HbA1C: No results for input(s): HGBA1C in the last 72 hours. CBG: No results for input(s): GLUCAP in the last 168 hours. Lipid Profile: No results for input(s): CHOL, HDL, LDLCALC, TRIG, CHOLHDL, LDLDIRECT in the last 72 hours. Thyroid Function Tests: No results for input(s): TSH, T4TOTAL, FREET4, T3FREE, THYROIDAB in the last 72 hours. Anemia Panel: No results for input(s): VITAMINB12, FOLATE, FERRITIN, TIBC, IRON, RETICCTPCT in the last 72 hours. Urine analysis:    Component Value Date/Time   COLORURINE STRAW (A) 02/20/2020 0854   APPEARANCEUR CLEAR (A) 02/20/2020 0854   APPEARANCEUR Hazy (A) 11/14/2019 1502   LABSPEC 1.006 02/20/2020 0854   PHURINE 5.0 02/20/2020 0854   GLUCOSEU NEGATIVE 02/20/2020 0854   HGBUR SMALL (A) 02/20/2020 0854   BILIRUBINUR NEGATIVE 02/20/2020 0854   BILIRUBINUR Negative 11/14/2019 1502   [place] 02/20/2020 0854   PROTEINUR NEGATIVE 02/20/2020 0854   NITRITE NEGATIVE 02/20/2020 0854   LEUKOCYTESUR NEGATIVE 02/20/2020 0854    Radiological Exams on Admission: DG Chest 2 View  Result Date: 02/20/2020 CLINICAL DATA:  [place] today with back pain EXAM: CHEST - 2 VIEW COMPARISON:  09/10/2019 FINDINGS: Heart size upper limits of  normal. Tortuous aorta as seen previously. The lungs are clear. No infiltrate, collapse, edema or effusion. Old right clavicle fracture. Compression fracture of T12 is visible as shown at lumbar radiography. IMPRESSION: No active cardiopulmonary disease. T12 compression fracture visible, better shown at lumbar radiography. Old right clavicle fracture. Electronically Signed   By: Nelson Chimes M.D.   On: 02/20/2020 13:59  DG Lumbar Spine Complete  Result Date: 02/20/2020 CLINICAL DATA:  Pain following fall EXAM: LUMBAR SPINE - COMPLETE 4+ VIEW COMPARISON:  CT abdomen and pelvis including bony reformats April 17, 2019 FINDINGS: Frontal, lateral, spot lumbosacral lateral, and bilateral oblique views were obtained. There are 5 non-rib-bearing lumbar type vertebral bodies. There is lumbar dextroscoliosis. There is moderate wedging of the T12 vertebral body which was not present previously and may well be acute. There is more subtle anterior wedging of the L2 vertebral body. No other fractures are appreciable. There is 2 mm of anterolisthesis of L3 on L4, stable. No new spondylolisthesis. There is moderately severe disc space narrowing at L3-4, L4-5, and L5-S1, also present previously. There is facet osteoarthritic change at L3-4, L4-5, and L5-S1 bilaterally. IMPRESSION: Scoliosis. Probable acute anterior wedge fractures at T12 and L2, more severe at T12. Stable slight anterolisthesis of L3 on L4. No new spondylolisthesis. Multilevel arthropathy, most severe at L3-4, L4-5, L5-S1, also noted on previous CT examination. Electronically Signed   By: Lowella Grip III M.D.   On: 02/20/2020 12:46   DG Pelvis 1-2 Views  Result Date: 02/20/2020 CLINICAL DATA:  Pain following fall EXAM: PELVIS - 1-2 VIEW COMPARISON:  None. FINDINGS: There is no evidence of pelvic fracture or dislocation. There is moderate symmetric narrowing of each hip joint. No erosive change. IMPRESSION: Symmetric narrowing each hip joint.  No  fracture or dislocation. Electronically Signed   By: Lowella Grip III M.D.   On: 02/20/2020 12:47   CT Head Wo Contrast  Result Date: 02/20/2020 CLINICAL DATA:  Head trauma history of Parkinson's EXAM: CT HEAD WITHOUT CONTRAST CT CERVICAL SPINE WITHOUT CONTRAST TECHNIQUE: Multidetector CT imaging of the head and cervical spine was performed following the standard protocol without intravenous contrast. Multiplanar CT image reconstructions of the cervical spine were also generated. COMPARISON:  None FINDINGS: CT HEAD FINDINGS Brain: No evidence of acute infarction, hemorrhage, hydrocephalus, extra-axial collection or mass lesion/mass effect. Signs of atrophy and chronic microvascular ischemic changes in the deep white matter. Vascular: No hyperdense vessel or unexpected calcification. Skull: Normal. Negative for fracture or focal lesion. Sinuses/Orbits: Visualized paranasal sinuses and orbits are unremarkable. Other: None. CT CERVICAL SPINE FINDINGS Alignment: Mild reversal of normal cervical lordosis in the setting of multilevel degenerative change. Degenerative changes greatest in the mid to upper cervical spine. Skull base and vertebrae: No signs of fracture of the cervical spine or skull base, cranial cervical junction. Signs of pathologic fracture, of the T2 vertebral body. Cortical margins suggest acute process. No retropulsion of fracture elements with fracture extending through the posterior vertebral body, associated with lucent lesion, likely metastatic disease in this location. Lucent area seen in this location on previous imaging evaluation. Soft tissues and spinal canal: No prevertebral fluid or swelling. No visible canal hematoma. Disc levels: Multilevel degenerative changes greatest at with disc space loss, near complete at C3-4, C4-5, C5-6 and C6-7. Facet arthropathy noted throughout the cervical spine greatest at C3-4 on the RIGHT Upper chest: Lung apices are clear. Subacute to chronic  RIGHT-sided first rib fracture. Other: None IMPRESSION: 1. No acute intracranial abnormality. 2. Signs of atrophy and chronic microvascular ischemic changes. 3. Pathologic fracture of T2 likely acute, through presumed metastatic focus in the T2 vertebral body. No additional fractures noted in the visualized portion of the thoracic spine or within the cervical spine. 4. Multilevel degenerative changes of the cervical spine. 5. Subacute to chronic RIGHT-sided first rib fracture. Electronically Signed   By:  Zetta Bills M.D.   On: 02/20/2020 11:56   CT Cervical Spine Wo Contrast  Result Date: 02/20/2020 CLINICAL DATA:  Head trauma history of Parkinson's EXAM: CT HEAD WITHOUT CONTRAST CT CERVICAL SPINE WITHOUT CONTRAST TECHNIQUE: Multidetector CT imaging of the head and cervical spine was performed following the standard protocol without intravenous contrast. Multiplanar CT image reconstructions of the cervical spine were also generated. COMPARISON:  None FINDINGS: CT HEAD FINDINGS Brain: No evidence of acute infarction, hemorrhage, hydrocephalus, extra-axial collection or mass lesion/mass effect. Signs of atrophy and chronic microvascular ischemic changes in the deep white matter. Vascular: No hyperdense vessel or unexpected calcification. Skull: Normal. Negative for fracture or focal lesion. Sinuses/Orbits: Visualized paranasal sinuses and orbits are unremarkable. Other: None. CT CERVICAL SPINE FINDINGS Alignment: Mild reversal of normal cervical lordosis in the setting of multilevel degenerative change. Degenerative changes greatest in the mid to upper cervical spine. Skull base and vertebrae: No signs of fracture of the cervical spine or skull base, cranial cervical junction. Signs of pathologic fracture, of the T2 vertebral body. Cortical margins suggest acute process. No retropulsion of fracture elements with fracture extending through the posterior vertebral body, associated with lucent lesion, likely  metastatic disease in this location. Lucent area seen in this location on previous imaging evaluation. Soft tissues and spinal canal: No prevertebral fluid or swelling. No visible canal hematoma. Disc levels: Multilevel degenerative changes greatest at with disc space loss, near complete at C3-4, C4-5, C5-6 and C6-7. Facet arthropathy noted throughout the cervical spine greatest at C3-4 on the RIGHT Upper chest: Lung apices are clear. Subacute to chronic RIGHT-sided first rib fracture. Other: None IMPRESSION: 1. No acute intracranial abnormality. 2. Signs of atrophy and chronic microvascular ischemic changes. 3. Pathologic fracture of T2 likely acute, through presumed metastatic focus in the T2 vertebral body. No additional fractures noted in the visualized portion of the thoracic spine or within the cervical spine. 4. Multilevel degenerative changes of the cervical spine. 5. Subacute to chronic RIGHT-sided first rib fracture. Electronically Signed   By: Zetta Bills M.D.   On: 02/20/2020 11:56   CT Thoracic Spine Wo Contrast  Result Date: 02/20/2020 CLINICAL DATA:  Mid to low back pain post fall. EXAM: CT THORACIC AND LUMBAR SPINE WITHOUT CONTRAST TECHNIQUE: Multidetector CT imaging of the thoracic and lumbar spine was performed without contrast. Multiplanar CT image reconstructions were also generated. COMPARISON:  Lumbar spine radiographs today. Cervical spine CT today, chest CT 04/01/2019 and abdominopelvic CT 04/17/2019 FINDINGS: CT THORACIC SPINE FINDINGS Alignment: Mildly progressive upper thoracic kyphosis without focal angulation or significant listhesis. Vertebrae: As seen on today's cervical spine CT, there is a mild fracture of the T2 vertebral body resulting in less than 25% loss of vertebral body height and no osseous retropulsion. This fracture appears pathologic with an underlying lytic lesion as seen on prior chest CT. There is also an acute nearly horizontal fracture through the T12 vertebral  body, resulting in 25% loss of vertebral body height and 4 mm of osseous retropulsion. This fracture does not show any definite pathologic features. Nonacute fractures of the right clavicle and right 1st rib anteriorly are noted. Possible lytic lesion and pathologic fracture of the left 7th rib near the costovertebral junction (image 64/2). Additional lytic lesions are noted within the T5 and T8 vertebral bodies, suspicious for metastatic disease or myeloma. Paraspinal and other soft tissues: No significant paraspinal hematoma. Mild aortic and branch vessel atherosclerosis. Emphysema and bibasilar atelectasis noted. Disc levels: Relatively mild  multilevel thoracic spondylosis with disc space narrowing and anterior osteophytes. There is a small right foraminal disc protrusion at T6-7. No high-grade foraminal narrowing. CT LUMBAR SPINE FINDINGS Segmentation: There are 5 lumbar type vertebral bodies. Alignment: Grade 1 degenerative anterolisthesis at L3-4 and minimal convex right scoliosis. Vertebrae: Lytic lesions are again noted within the L2 vertebral body and upper sacrum, the latter measuring up to 3.1 cm. These have sclerotic margins and may be partially treated. Other lytic lesions are present within the right aspect of the sacrum. These were all present and described on previous abdominal CT. There is a pathologic fracture of the L2 vertebral body with 30% loss of vertebral body height, asymmetric to the left. This fracture does not appear acute. No definite acute fractures in the lumbar spine. Paraspinal and other soft tissues: No enlarging paraspinal soft tissue masses or hematomas. Mild aortic and branch vessel atherosclerosis. Disc levels: L1-2: Progressive loss of disc height with a new extruded left paracentral disc fragment containing vacuum phenomenon extending inferiorly behind the L2 vertebral body. This exerts mass effect on the thecal sac and may contribute to left-sided nerve root encroachment.  Facet and ligamentous hypertrophy contribute to mild left foraminal narrowing. L2-3: Chronic degenerative disc disease with loss of disc height and endplate osteophytes asymmetric to the left. Mild facet and ligamentous hypertrophy. Mild spinal stenosis with mild asymmetric narrowing of the left lateral recess and left foramen. L3-4: Chronic degenerative disc disease with loss of disc height, endplate osteophytes and moderate facet hypertrophy. Resulting grade 1 anterolisthesis contributes to moderate multifactorial spinal stenosis, moderate lateral recess and mild foraminal narrowing bilaterally. L4-5: Probable previous posterior decompression. Chronic loss of disc height with annular disc bulging and endplate osteophytes asymmetric to the right. Mild right foraminal narrowing. L5-S1: Probable posterior decompression. Mild disc bulging and endplate osteophytes asymmetric to the left. Bilateral facet hypertrophy, worse on the right. Mild left foraminal narrowing. IMPRESSION: 1. Acute fractures of the T2 and T12 vertebral bodies, as described. The T2 fracture appears pathologic. 2. Interval pathologic fracture of the L2 vertebral body compared with CTs from September. This fracture does not appear acute. No definite acute fractures in the lumbar spine. 3. Multiple lytic lesions are again noted throughout the thoracolumbar spine and sacrum consistent with multiple myeloma or metastatic disease. Correlate with previous biopsy results. 4. Progressive degenerative changes at L1-2 with a new extruded disc fragment containing vacuum phenomenon extending inferiorly behind the L2 vertebral body on the left. This may contribute to left-sided nerve root encroachment. 5. Aortic Atherosclerosis (ICD10-I70.0) and Emphysema (ICD10-J43.9). Electronically Signed   By: Richardean Sale M.D.   On: 02/20/2020 14:13   CT Lumbar Spine Wo Contrast  Result Date: 02/20/2020 CLINICAL DATA:  Mid to low back pain post fall. EXAM: CT  THORACIC AND LUMBAR SPINE WITHOUT CONTRAST TECHNIQUE: Multidetector CT imaging of the thoracic and lumbar spine was performed without contrast. Multiplanar CT image reconstructions were also generated. COMPARISON:  Lumbar spine radiographs today. Cervical spine CT today, chest CT 04/01/2019 and abdominopelvic CT 04/17/2019 FINDINGS: CT THORACIC SPINE FINDINGS Alignment: Mildly progressive upper thoracic kyphosis without focal angulation or significant listhesis. Vertebrae: As seen on today's cervical spine CT, there is a mild fracture of the T2 vertebral body resulting in less than 25% loss of vertebral body height and no osseous retropulsion. This fracture appears pathologic with an underlying lytic lesion as seen on prior chest CT. There is also an acute nearly horizontal fracture through the T12 vertebral body, resulting in  25% loss of vertebral body height and 4 mm of osseous retropulsion. This fracture does not show any definite pathologic features. Nonacute fractures of the right clavicle and right 1st rib anteriorly are noted. Possible lytic lesion and pathologic fracture of the left 7th rib near the costovertebral junction (image 64/2). Additional lytic lesions are noted within the T5 and T8 vertebral bodies, suspicious for metastatic disease or myeloma. Paraspinal and other soft tissues: No significant paraspinal hematoma. Mild aortic and branch vessel atherosclerosis. Emphysema and bibasilar atelectasis noted. Disc levels: Relatively mild multilevel thoracic spondylosis with disc space narrowing and anterior osteophytes. There is a small right foraminal disc protrusion at T6-7. No high-grade foraminal narrowing. CT LUMBAR SPINE FINDINGS Segmentation: There are 5 lumbar type vertebral bodies. Alignment: Grade 1 degenerative anterolisthesis at L3-4 and minimal convex right scoliosis. Vertebrae: Lytic lesions are again noted within the L2 vertebral body and upper sacrum, the latter measuring up to 3.1 cm.  These have sclerotic margins and may be partially treated. Other lytic lesions are present within the right aspect of the sacrum. These were all present and described on previous abdominal CT. There is a pathologic fracture of the L2 vertebral body with 30% loss of vertebral body height, asymmetric to the left. This fracture does not appear acute. No definite acute fractures in the lumbar spine. Paraspinal and other soft tissues: No enlarging paraspinal soft tissue masses or hematomas. Mild aortic and branch vessel atherosclerosis. Disc levels: L1-2: Progressive loss of disc height with a new extruded left paracentral disc fragment containing vacuum phenomenon extending inferiorly behind the L2 vertebral body. This exerts mass effect on the thecal sac and may contribute to left-sided nerve root encroachment. Facet and ligamentous hypertrophy contribute to mild left foraminal narrowing. L2-3: Chronic degenerative disc disease with loss of disc height and endplate osteophytes asymmetric to the left. Mild facet and ligamentous hypertrophy. Mild spinal stenosis with mild asymmetric narrowing of the left lateral recess and left foramen. L3-4: Chronic degenerative disc disease with loss of disc height, endplate osteophytes and moderate facet hypertrophy. Resulting grade 1 anterolisthesis contributes to moderate multifactorial spinal stenosis, moderate lateral recess and mild foraminal narrowing bilaterally. L4-5: Probable previous posterior decompression. Chronic loss of disc height with annular disc bulging and endplate osteophytes asymmetric to the right. Mild right foraminal narrowing. L5-S1: Probable posterior decompression. Mild disc bulging and endplate osteophytes asymmetric to the left. Bilateral facet hypertrophy, worse on the right. Mild left foraminal narrowing. IMPRESSION: 1. Acute fractures of the T2 and T12 vertebral bodies, as described. The T2 fracture appears pathologic. 2. Interval pathologic fracture of  the L2 vertebral body compared with CTs from September. This fracture does not appear acute. No definite acute fractures in the lumbar spine. 3. Multiple lytic lesions are again noted throughout the thoracolumbar spine and sacrum consistent with multiple myeloma or metastatic disease. Correlate with previous biopsy results. 4. Progressive degenerative changes at L1-2 with a new extruded disc fragment containing vacuum phenomenon extending inferiorly behind the L2 vertebral body on the left. This may contribute to left-sided nerve root encroachment. 5. Aortic Atherosclerosis (ICD10-I70.0) and Emphysema (ICD10-J43.9). Electronically Signed   By: Richardean Sale M.D.   On: 02/20/2020 14:13    EKG: None  Assessment/Plan Principal Problem:   Compression fracture of body of thoracic vertebra (HCC) Active Problems:   Hyponatremia   Hypokalemia   AKI (acute kidney injury) ([place])   Constipation   Lower extremity edema   Multiple myeloma (HCC)   Essential hypertension  Pancytopenia ([place])   Parkinson disease ([place])   Mixed hyperlipidemia   Chronic obstructive pulmonary disease (HCC)   BPH (benign prostatic hyperplasia)   Venous insufficiency    Compression fracture of body of thoracic vertebra (T2, T12) -present on admission status post mechanical fall in the setting of multiple myeloma with lytic skeletal lesions.  Follows with Dr. Grayland Ormond. --Lidocaine patches --Pain control as needed --PT and OT evaluations --Unlikely candidate for kyphoplasty given multiple myeloma and lytic lesions --Bowel regimen  Hyponatremia -presented with sodium 127.  Etiology to be determined, but appears hypervolemic on exam with edema.  He also reports not drinking as much as he showed.   --Follow-up on urine and serum osmolalities, urine electrolytes, TSH, cortisol, uric acid --Daily BMP --Gentle fluid challenge for now and recheck overnight to trend (stop fluids if Na not better)  Hypokalemia -presented with  potassium <2.0.  Replacing with oral potassium and LR+40 mEq K at 75 cc/hr.  Daily BMP.  Acute kidney injury -presented with creatinine 1.96, up from 0.79 in February.  He reports not drinking as much as he should, suspect prerenal azotemia.  Renal ultrasound pending.  Gentle IV fluids as above.  BMP in AM.  Hold home Lasix, spironolactone,and Mobic.  Avoid nephrotoxins and hypotension, renally dose meds as indicated.  Constipation - chronic, due to Parkinson's.  Bowel regimen ordered.  Lower extremity edema /venous insufficiency -presented with 2-3+ pitting edema of the bilateral lower extremities.  Uses lymphedema pumps at home.  Holding his Lasix and spironolactone due to AKI.  SCDs and TED hose for VT prophylaxis.     Multiple myeloma - follows with Dr. Grayland Ormond, on lenalidomide (21 days on, 7 off).  Appears his recent cycle started on 7/8, will hold lenalidomide for now defer to oncology.    Essential hypertension -chronic, stable.  BP was 100/70 on admission.  Holding home Lasix and spironolactone due to AKI.  Monitor blood pressure.  As needed oral hydralazine.  Pancytopenia -present on admission, presumed due to chemotherapy and multiple myeloma.  WBC 2.9, Hbg 9.7, Plt 67.  Monitor CBC.  SCDs and TED hose for VTE prophylaxis.  Aspirin is held.  Parkinson disease - continue carbidopa levodopa  Hyperlipidemia -continue Crestor  COPD -continue Trelegy, PRN DuoNeb  BPH - Flomax and Proscar    DVT prophylaxis: SCDs Start: 02/20/20 1512 Place TED hose Start: 02/20/20 1512   Code Status: DNR Family Communication: None at bedside, will attempt to call Disposition Plan: Expect discharge to SNF versus ALF once electrolytes are stable Consults called: Oncology, Dr. Grayland Ormond  Admission status:  Status is: Inpatient  Remains inpatient appropriate because:Inpatient level of care appropriate due to severity of illness   Dispo: The patient is from: Home              Anticipated d/c is  to: To be determined, patient requesting ALF vs SNF              Anticipated d/c date is: 2 days              Patient currently is not medically stable to d/c.         Ezekiel Slocumb, DO [facility]  02/20/2020, 4:06 PM    If 7PM-7AM, please contact night-coverage. How to contact the [facility] Attending or Consulting provider [place] or covering provider during after hours [place], for this patient?    1. Check the care team in [facility] and look for a) attending/consulting  [place] provider listed and b) the [facility] team listed 2. Log into [email or web address] and use [place]'s universal password to access. If you do not have the password, please contact the hospital operator. 3. Locate the [facility] provider you are looking for under [facility] and page to a number that you can be directly reached. 4. If you still have difficulty reaching the provider, please page the [facility] (Director on Call) for the Hospitalists listed on amion for assistance.

## 2020-02-20 NOTE — ED Triage Notes (Signed)
Pt in via EMS with c/o fall. Pt was trying to get up to go t the [facility]. Pt has 1 inch skin tear to right forearm, bandage in place. 93/58 BP, normal per pt. Pt denies dizziness, FSBS 157

## 2020-02-21 ENCOUNTER — Other Ambulatory Visit: Payer: Self-pay

## 2020-02-21 ENCOUNTER — Encounter
Admission: RE | Admit: 2020-02-21 | Discharge: 2020-02-21 | Disposition: A | Discharge status: Home / Self Care | Payer: Medicare Other | Source: Ambulatory Visit | Attending: Internal Medicine | Admitting: Internal Medicine

## 2020-02-21 DIAGNOSIS — R197 Diarrhea, unspecified: Secondary | ICD-10-CM | POA: Insufficient documentation

## 2020-02-21 DIAGNOSIS — D649 Anemia, unspecified: Secondary | ICD-10-CM | POA: Diagnosis not present

## 2020-02-21 DIAGNOSIS — E876 Hypokalemia: Secondary | ICD-10-CM

## 2020-02-21 DIAGNOSIS — N179 Acute kidney failure, unspecified: Secondary | ICD-10-CM | POA: Diagnosis not present

## 2020-02-21 DIAGNOSIS — S22000A Wedge compression fracture of unspecified thoracic vertebra, initial encounter for closed fracture: Secondary | ICD-10-CM

## 2020-02-21 DIAGNOSIS — K5904 Chronic idiopathic constipation: Secondary | ICD-10-CM

## 2020-02-21 LAB — CBC
HCT: 25.2 % — ABNORMAL LOW (ref 39.0–52.0)
Hemoglobin: 8.1 g/dL — ABNORMAL LOW (ref 13.0–17.0)
MCH: 29.9 pg (ref 26.0–34.0)
MCHC: 32.1 g/dL (ref 30.0–36.0)
MCV: 93 fL (ref 80.0–100.0)
Platelets: 55 10*3/uL — ABNORMAL LOW (ref 150–400)
RBC: 2.71 MIL/uL — ABNORMAL LOW (ref 4.22–5.81)
RDW: 18.6 % — ABNORMAL HIGH (ref 11.5–15.5)
WBC: 2.2 10*3/uL — ABNORMAL LOW (ref 4.0–10.5)
nRBC: 0 % (ref 0.0–0.2)

## 2020-02-21 LAB — MAGNESIUM: Magnesium: 1.8 mg/dL (ref 1.7–2.4)

## 2020-02-21 LAB — URIC ACID: Uric Acid, Serum: 3.4 mg/dL — ABNORMAL LOW (ref 3.7–8.6)

## 2020-02-21 LAB — GLUCOSE, CAPILLARY: Glucose-Capillary: 113 mg/dL — ABNORMAL HIGH (ref 70–99)

## 2020-02-21 LAB — CORTISOL: Cortisol, Plasma: 27.8 ug/dL

## 2020-02-21 MED ORDER — ENSURE ENLIVE PO LIQD
237.0000 mL | Freq: Three times a day (TID) | ORAL | Status: DC
  Administered 2020-02-21 – 2020-02-25 (×10): 237 mL via ORAL

## 2020-02-21 MED ORDER — BISACODYL 10 MG RE SUPP: 10.0000 mg | Freq: Every day | RECTAL | Status: DC | PRN

## 2020-02-21 MED ORDER — BISACODYL 10 MG RE SUPP
10.0000 mg | Freq: Every day | RECTAL | Status: DC | PRN
  Filled 2020-02-21: qty 1

## 2020-02-21 MED ORDER — MAGNESIUM SULFATE IN D5W 1-5 GM/100ML-% IV SOLN
1.0000 g | Freq: Once | INTRAVENOUS | Status: DC
  Filled 2020-02-21: qty 100

## 2020-02-21 MED ORDER — FLEET ENEMA 7-19 GM/118ML RE ENEM
1.0000 | ENEMA | Freq: Once | RECTAL | Status: AC
  Administered 2020-02-21: 1 via RECTAL

## 2020-02-21 MED ORDER — POTASSIUM CHLORIDE CRYS ER 20 MEQ PO TBCR
40.0000 meq | EXTENDED_RELEASE_TABLET | ORAL | Status: AC
  Administered 2020-02-21: 40 meq via ORAL
  Filled 2020-02-21: qty 2

## 2020-02-21 MED ORDER — SODIUM CHLORIDE 0.9 % IV BOLUS
1000.0000 mL | Freq: Once | INTRAVENOUS | Status: AC
  Administered 2020-02-21: 1000 mL via INTRAVENOUS

## 2020-02-21 NOTE — Progress Notes (Signed)
PROGRESS NOTE    Adam Moss  PXT:[id number] DOB: [birth year] DOA: 02/20/2020 PCP: Perrin Maltese, MD    Assessment & Plan:   Principal Problem:   Compression fracture of body of thoracic vertebra [facility]) Active Problems:   Parkinson disease ([place])   Lower extremity edema   Mixed hyperlipidemia   Chronic obstructive pulmonary disease ([place])   Multiple myeloma ([place])   Essential hypertension   BPH (benign prostatic hyperplasia)   Venous insufficiency   Hyponatremia   Hypokalemia   AKI (acute kidney injury) ([place])   Pancytopenia ([facility])   Constipation   Compression fracture of body of thoracic vertebra: present on admission status post mechanical fall in the setting of multiple myeloma with lytic skeletal lesions. Continue on lidocaine patch. IV morphine & oxycodone prn for pain. PT/OT consulted  Hyponatremia: etiology unclear, possibly hypervolemic. Na level is trending up today. Will continue to monitor   Hypokalemia: still low but slightly improved from day prior. KCl repleted. Mg is WNL. Will continue to montior   AKI: etiology unclear, likely pre-renal. Continue on IVFs. Cr is trending down from day prior. Will continue to monitor   Chronic constipation: secondary to Parkinson's. Continue on dulcolax, enema prn   LE edema: likely secondary to venous insufficiency. Continue to hold lasix & spironolactone secondary to AKI.   Multiple myeloma: currently on lenalidomide (21 days on, 7 off).  This cycle started on 01/31/20, will hold lenalidomide for now defer to oncology.  Dr. Grayland Ormond is the pt's oncologist   HTN: continue to hold home lasix and spironolactone secondary to AKI.  Hydralazine prn   Pancytopenia: present on admission. Likely secondary to chemotherapy and multiple myeloma.  Continue to hold aspirin  Parkinson disease: continue on home dose of carbidopa levodopa  Hyperlipidemia: continue on statin   COPD: w/o exacerbation. Continue on bronchodilators    BPH: continue on home dose of flomax and finasteride    DVT prophylaxis: SCDs Code Status: DNR Family Communication: called pt's daughter, [name], but she is currently not home, will call again about 1 hr Disposition Plan: depends on PT/OT recs   Consultants:      Procedures:    Antimicrobials:    Subjective: Pt c/o constipation x 7 days.  Objective: Vitals:   02/20/20 2026 02/20/20 2045 02/21/20 0032 02/21/20 0611  BP: (!) 96/63 (!) '94/60 92/73 99/66 '  Pulse:  65 67 60  Resp:  '14 19 12  ' Temp:  98.8 F (37.1 C) 98.2 F (36.8 C)   TempSrc:  Oral Oral   SpO2:  100% 95% 98%  Weight:      Height:        Intake/Output Summary (Last 24 hours) at 02/21/2020 0808 Last data filed at 02/21/2020 0600 Gross per 24 hour  Intake 1198.92 ml  Output 500 ml  Net 698.92 ml   Filed Weights   02/20/20 0850  Weight: 74.8 kg    Examination:  General exam: Appears calm but uncomfortable  Respiratory system: Clear to auscultation. No rales, rhonchi  Cardiovascular system: S1 & S2 +. No rubs, gallops or clicks. B/l LE edema  Gastrointestinal system: Abdomen is nondistended, soft and nontender. Hypoactive bowel sounds heard. Central nervous system: Alert and oriented. Moves all 4 extremities Psychiatry: Judgement and insight appear normal. Flat mood and affect     Data Reviewed: I have personally reviewed following labs and imaging studies  CBC: Recent Labs  Lab 02/20/20 1316 02/21/20 0523  WBC 2.9* 2.2*  HGB 9.7* 8.1*  HCT 28.7* 25.2*  MCV 91.7 93.0  PLT 67* 55*   Basic Metabolic Panel: Recent Labs  Lab 02/20/20 1316 02/20/20 2209 02/21/20 0523  NA 127* 130* 131*  K <2.0* 2.1* 2.4*  CL 77* 80* 84*  CO2 32 37* 38*  GLUCOSE 111* 123* 109*  BUN 40* 35* 34*  CREATININE 1.96* 1.69* 1.58*  CALCIUM 9.8 8.6* 8.2*  MG 2.0  --  1.8   GFR: Estimated Creatinine Clearance: 35.9 mL/min (A) (by C-G formula based on SCr of 1.58 mg/dL (H)). Liver Function Tests: No  results for input(s): AST, ALT, ALKPHOS, BILITOT, PROT, ALBUMIN in the last 168 hours. No results for input(s): LIPASE, AMYLASE in the last 168 hours. No results for input(s): AMMONIA in the last 168 hours. Coagulation Profile: No results for input(s): INR, PROTIME in the last 168 hours. Cardiac Enzymes: No results for input(s): CKTOTAL, CKMB, CKMBINDEX, TROPONINI in the last 168 hours. BNP (last 3 results) No results for input(s): PROBNP in the last 8760 hours. HbA1C: No results for input(s): HGBA1C in the last 72 hours. CBG: No results for input(s): GLUCAP in the last 168 hours. Lipid Profile: No results for input(s): CHOL, HDL, LDLCALC, TRIG, CHOLHDL, LDLDIRECT in the last 72 hours. Thyroid Function Tests: Recent Labs    02/20/20 1514  TSH 0.780   Anemia Panel: No results for input(s): VITAMINB12, FOLATE, FERRITIN, TIBC, IRON, RETICCTPCT in the last 72 hours. Sepsis Labs: No results for input(s): PROCALCITON, LATICACIDVEN in the last 168 hours.  Recent Results (from the past 240 hour(s))  SARS Coronavirus 2 by RT PCR (hospital order, performed in [facility] hospital lab) Nasopharyngeal     Status: None   Collection Time: 02/20/20  1:16 PM   Specimen: Nasopharyngeal  Result Value Ref Range Status   SARS Coronavirus 2 NEGATIVE NEGATIVE Final    Comment: (NOTE) SARS-CoV-2 target nucleic acids are NOT DETECTED.  The SARS-CoV-2 RNA is generally detectable in upper and lower respiratory specimens during the acute phase of infection. The lowest concentration of SARS-CoV-2 viral copies this assay can detect is 250 copies / mL. A negative result does not preclude SARS-CoV-2 infection and should not be used as the sole basis for treatment or other patient management decisions.  A negative result may occur with improper specimen collection / handling, submission of specimen other than nasopharyngeal swab, presence of viral mutation(s) within the areas targeted by this assay, and  inadequate number of viral copies (<250 copies / mL). A negative result must be combined with clinical observations, patient history, and epidemiological information.  Fact Sheet for Patients:   StrictlyIdeas.no  Fact Sheet for Healthcare Providers: BankingDealers.co.za  This test is not yet approved or  cleared by the [place] FDA and has been authorized for detection and/or diagnosis of SARS-CoV-2 by FDA under an Emergency Use Authorization (EUA).  This EUA will remain in effect (meaning this test can be used) for the duration of the COVID-19 declaration under Section 564(b)(1) of the Act, 21 U.S.C. section 360bbb-3(b)(1), unless the authorization is terminated or revoked sooner.  Performed at [facility], [address] [place], [place] [address]          Radiology Studies: DG Chest 2 View  Result Date: 02/20/2020 CLINICAL DATA:  [place] today with back pain EXAM: CHEST - 2 VIEW COMPARISON:  09/10/2019 FINDINGS: Heart size upper limits of normal. Tortuous aorta as seen previously. The lungs are clear. No infiltrate, collapse, edema or effusion. Old right clavicle fracture.  Compression fracture of T12 is visible as shown at lumbar radiography. IMPRESSION: No active cardiopulmonary disease. T12 compression fracture visible, better shown at lumbar radiography. Old right clavicle fracture. Electronically Signed   By: Nelson Chimes M.D.   On: 02/20/2020 13:59   DG Lumbar Spine Complete  Result Date: 02/20/2020 CLINICAL DATA:  Pain following fall EXAM: LUMBAR SPINE - COMPLETE 4+ VIEW COMPARISON:  CT abdomen and pelvis including bony reformats April 17, 2019 FINDINGS: Frontal, lateral, spot lumbosacral lateral, and bilateral oblique views were obtained. There are 5 non-rib-bearing lumbar type vertebral bodies. There is lumbar dextroscoliosis. There is moderate wedging of the T12 vertebral body which was not present  previously and may well be acute. There is more subtle anterior wedging of the L2 vertebral body. No other fractures are appreciable. There is 2 mm of anterolisthesis of L3 on L4, stable. No new spondylolisthesis. There is moderately severe disc space narrowing at L3-4, L4-5, and L5-S1, also present previously. There is facet osteoarthritic change at L3-4, L4-5, and L5-S1 bilaterally. IMPRESSION: Scoliosis. Probable acute anterior wedge fractures at T12 and L2, more severe at T12. Stable slight anterolisthesis of L3 on L4. No new spondylolisthesis. Multilevel arthropathy, most severe at L3-4, L4-5, L5-S1, also noted on previous CT examination. Electronically Signed   By: Lowella Grip III M.D.   On: 02/20/2020 12:46   DG Pelvis 1-2 Views  Result Date: 02/20/2020 CLINICAL DATA:  Pain following fall EXAM: PELVIS - 1-2 VIEW COMPARISON:  None. FINDINGS: There is no evidence of pelvic fracture or dislocation. There is moderate symmetric narrowing of each hip joint. No erosive change. IMPRESSION: Symmetric narrowing each hip joint.  No fracture or dislocation. Electronically Signed   By: Lowella Grip III M.D.   On: 02/20/2020 12:47   CT Head Wo Contrast  Result Date: 02/20/2020 CLINICAL DATA:  Head trauma history of Parkinson's EXAM: CT HEAD WITHOUT CONTRAST CT CERVICAL SPINE WITHOUT CONTRAST TECHNIQUE: Multidetector CT imaging of the head and cervical spine was performed following the standard protocol without intravenous contrast. Multiplanar CT image reconstructions of the cervical spine were also generated. COMPARISON:  None FINDINGS: CT HEAD FINDINGS Brain: No evidence of acute infarction, hemorrhage, hydrocephalus, extra-axial collection or mass lesion/mass effect. Signs of atrophy and chronic microvascular ischemic changes in the deep white matter. Vascular: No hyperdense vessel or unexpected calcification. Skull: Normal. Negative for fracture or focal lesion. Sinuses/Orbits: Visualized paranasal  sinuses and orbits are unremarkable. Other: None. CT CERVICAL SPINE FINDINGS Alignment: Mild reversal of normal cervical lordosis in the setting of multilevel degenerative change. Degenerative changes greatest in the mid to upper cervical spine. Skull base and vertebrae: No signs of fracture of the cervical spine or skull base, cranial cervical junction. Signs of pathologic fracture, of the T2 vertebral body. Cortical margins suggest acute process. No retropulsion of fracture elements with fracture extending through the posterior vertebral body, associated with lucent lesion, likely metastatic disease in this location. Lucent area seen in this location on previous imaging evaluation. Soft tissues and spinal canal: No prevertebral fluid or swelling. No visible canal hematoma. Disc levels: Multilevel degenerative changes greatest at with disc space loss, near complete at C3-4, C4-5, C5-6 and C6-7. Facet arthropathy noted throughout the cervical spine greatest at C3-4 on the RIGHT Upper chest: Lung apices are clear. Subacute to chronic RIGHT-sided first rib fracture. Other: None IMPRESSION: 1. No acute intracranial abnormality. 2. Signs of atrophy and chronic microvascular ischemic changes. 3. Pathologic fracture of T2 likely acute, through presumed  metastatic focus in the T2 vertebral body. No additional fractures noted in the visualized portion of the thoracic spine or within the cervical spine. 4. Multilevel degenerative changes of the cervical spine. 5. Subacute to chronic RIGHT-sided first rib fracture. Electronically Signed   By: Zetta Bills M.D.   On: 02/20/2020 11:56   CT Cervical Spine Wo Contrast  Result Date: 02/20/2020 CLINICAL DATA:  Head trauma history of Parkinson's EXAM: CT HEAD WITHOUT CONTRAST CT CERVICAL SPINE WITHOUT CONTRAST TECHNIQUE: Multidetector CT imaging of the head and cervical spine was performed following the standard protocol without intravenous contrast. Multiplanar CT image  reconstructions of the cervical spine were also generated. COMPARISON:  None FINDINGS: CT HEAD FINDINGS Brain: No evidence of acute infarction, hemorrhage, hydrocephalus, extra-axial collection or mass lesion/mass effect. Signs of atrophy and chronic microvascular ischemic changes in the deep white matter. Vascular: No hyperdense vessel or unexpected calcification. Skull: Normal. Negative for fracture or focal lesion. Sinuses/Orbits: Visualized paranasal sinuses and orbits are unremarkable. Other: None. CT CERVICAL SPINE FINDINGS Alignment: Mild reversal of normal cervical lordosis in the setting of multilevel degenerative change. Degenerative changes greatest in the mid to upper cervical spine. Skull base and vertebrae: No signs of fracture of the cervical spine or skull base, cranial cervical junction. Signs of pathologic fracture, of the T2 vertebral body. Cortical margins suggest acute process. No retropulsion of fracture elements with fracture extending through the posterior vertebral body, associated with lucent lesion, likely metastatic disease in this location. Lucent area seen in this location on previous imaging evaluation. Soft tissues and spinal canal: No prevertebral fluid or swelling. No visible canal hematoma. Disc levels: Multilevel degenerative changes greatest at with disc space loss, near complete at C3-4, C4-5, C5-6 and C6-7. Facet arthropathy noted throughout the cervical spine greatest at C3-4 on the RIGHT Upper chest: Lung apices are clear. Subacute to chronic RIGHT-sided first rib fracture. Other: None IMPRESSION: 1. No acute intracranial abnormality. 2. Signs of atrophy and chronic microvascular ischemic changes. 3. Pathologic fracture of T2 likely acute, through presumed metastatic focus in the T2 vertebral body. No additional fractures noted in the visualized portion of the thoracic spine or within the cervical spine. 4. Multilevel degenerative changes of the cervical spine. 5. Subacute to  chronic RIGHT-sided first rib fracture. Electronically Signed   By: Zetta Bills M.D.   On: 02/20/2020 11:56   CT Thoracic Spine Wo Contrast  Result Date: 02/20/2020 CLINICAL DATA:  Mid to low back pain post fall. EXAM: CT THORACIC AND LUMBAR SPINE WITHOUT CONTRAST TECHNIQUE: Multidetector CT imaging of the thoracic and lumbar spine was performed without contrast. Multiplanar CT image reconstructions were also generated. COMPARISON:  Lumbar spine radiographs today. Cervical spine CT today, chest CT 04/01/2019 and abdominopelvic CT 04/17/2019 FINDINGS: CT THORACIC SPINE FINDINGS Alignment: Mildly progressive upper thoracic kyphosis without focal angulation or significant listhesis. Vertebrae: As seen on today's cervical spine CT, there is a mild fracture of the T2 vertebral body resulting in less than 25% loss of vertebral body height and no osseous retropulsion. This fracture appears pathologic with an underlying lytic lesion as seen on prior chest CT. There is also an acute nearly horizontal fracture through the T12 vertebral body, resulting in 25% loss of vertebral body height and 4 mm of osseous retropulsion. This fracture does not show any definite pathologic features. Nonacute fractures of the right clavicle and right 1st rib anteriorly are noted. Possible lytic lesion and pathologic fracture of the left 7th rib near the costovertebral  junction (image 64/2). Additional lytic lesions are noted within the T5 and T8 vertebral bodies, suspicious for metastatic disease or myeloma. Paraspinal and other soft tissues: No significant paraspinal hematoma. Mild aortic and branch vessel atherosclerosis. Emphysema and bibasilar atelectasis noted. Disc levels: Relatively mild multilevel thoracic spondylosis with disc space narrowing and anterior osteophytes. There is a small right foraminal disc protrusion at T6-7. No high-grade foraminal narrowing. CT LUMBAR SPINE FINDINGS Segmentation: There are 5 lumbar type  vertebral bodies. Alignment: Grade 1 degenerative anterolisthesis at L3-4 and minimal convex right scoliosis. Vertebrae: Lytic lesions are again noted within the L2 vertebral body and upper sacrum, the latter measuring up to 3.1 cm. These have sclerotic margins and may be partially treated. Other lytic lesions are present within the right aspect of the sacrum. These were all present and described on previous abdominal CT. There is a pathologic fracture of the L2 vertebral body with 30% loss of vertebral body height, asymmetric to the left. This fracture does not appear acute. No definite acute fractures in the lumbar spine. Paraspinal and other soft tissues: No enlarging paraspinal soft tissue masses or hematomas. Mild aortic and branch vessel atherosclerosis. Disc levels: L1-2: Progressive loss of disc height with a new extruded left paracentral disc fragment containing vacuum phenomenon extending inferiorly behind the L2 vertebral body. This exerts mass effect on the thecal sac and may contribute to left-sided nerve root encroachment. Facet and ligamentous hypertrophy contribute to mild left foraminal narrowing. L2-3: Chronic degenerative disc disease with loss of disc height and endplate osteophytes asymmetric to the left. Mild facet and ligamentous hypertrophy. Mild spinal stenosis with mild asymmetric narrowing of the left lateral recess and left foramen. L3-4: Chronic degenerative disc disease with loss of disc height, endplate osteophytes and moderate facet hypertrophy. Resulting grade 1 anterolisthesis contributes to moderate multifactorial spinal stenosis, moderate lateral recess and mild foraminal narrowing bilaterally. L4-5: Probable previous posterior decompression. Chronic loss of disc height with annular disc bulging and endplate osteophytes asymmetric to the right. Mild right foraminal narrowing. L5-S1: Probable posterior decompression. Mild disc bulging and endplate osteophytes asymmetric to the  left. Bilateral facet hypertrophy, worse on the right. Mild left foraminal narrowing. IMPRESSION: 1. Acute fractures of the T2 and T12 vertebral bodies, as described. The T2 fracture appears pathologic. 2. Interval pathologic fracture of the L2 vertebral body compared with CTs from September. This fracture does not appear acute. No definite acute fractures in the lumbar spine. 3. Multiple lytic lesions are again noted throughout the thoracolumbar spine and sacrum consistent with multiple myeloma or metastatic disease. Correlate with previous biopsy results. 4. Progressive degenerative changes at L1-2 with a new extruded disc fragment containing vacuum phenomenon extending inferiorly behind the L2 vertebral body on the left. This may contribute to left-sided nerve root encroachment. 5. Aortic Atherosclerosis (ICD10-I70.0) and Emphysema (ICD10-J43.9). Electronically Signed   By: Richardean Sale M.D.   On: 02/20/2020 14:13   CT Lumbar Spine Wo Contrast  Result Date: 02/20/2020 CLINICAL DATA:  Mid to low back pain post fall. EXAM: CT THORACIC AND LUMBAR SPINE WITHOUT CONTRAST TECHNIQUE: Multidetector CT imaging of the thoracic and lumbar spine was performed without contrast. Multiplanar CT image reconstructions were also generated. COMPARISON:  Lumbar spine radiographs today. Cervical spine CT today, chest CT 04/01/2019 and abdominopelvic CT 04/17/2019 FINDINGS: CT THORACIC SPINE FINDINGS Alignment: Mildly progressive upper thoracic kyphosis without focal angulation or significant listhesis. Vertebrae: As seen on today's cervical spine CT, there is a mild fracture of the T2 vertebral  body resulting in less than 25% loss of vertebral body height and no osseous retropulsion. This fracture appears pathologic with an underlying lytic lesion as seen on prior chest CT. There is also an acute nearly horizontal fracture through the T12 vertebral body, resulting in 25% loss of vertebral body height and 4 mm of osseous  retropulsion. This fracture does not show any definite pathologic features. Nonacute fractures of the right clavicle and right 1st rib anteriorly are noted. Possible lytic lesion and pathologic fracture of the left 7th rib near the costovertebral junction (image 64/2). Additional lytic lesions are noted within the T5 and T8 vertebral bodies, suspicious for metastatic disease or myeloma. Paraspinal and other soft tissues: No significant paraspinal hematoma. Mild aortic and branch vessel atherosclerosis. Emphysema and bibasilar atelectasis noted. Disc levels: Relatively mild multilevel thoracic spondylosis with disc space narrowing and anterior osteophytes. There is a small right foraminal disc protrusion at T6-7. No high-grade foraminal narrowing. CT LUMBAR SPINE FINDINGS Segmentation: There are 5 lumbar type vertebral bodies. Alignment: Grade 1 degenerative anterolisthesis at L3-4 and minimal convex right scoliosis. Vertebrae: Lytic lesions are again noted within the L2 vertebral body and upper sacrum, the latter measuring up to 3.1 cm. These have sclerotic margins and may be partially treated. Other lytic lesions are present within the right aspect of the sacrum. These were all present and described on previous abdominal CT. There is a pathologic fracture of the L2 vertebral body with 30% loss of vertebral body height, asymmetric to the left. This fracture does not appear acute. No definite acute fractures in the lumbar spine. Paraspinal and other soft tissues: No enlarging paraspinal soft tissue masses or hematomas. Mild aortic and branch vessel atherosclerosis. Disc levels: L1-2: Progressive loss of disc height with a new extruded left paracentral disc fragment containing vacuum phenomenon extending inferiorly behind the L2 vertebral body. This exerts mass effect on the thecal sac and may contribute to left-sided nerve root encroachment. Facet and ligamentous hypertrophy contribute to mild left foraminal  narrowing. L2-3: Chronic degenerative disc disease with loss of disc height and endplate osteophytes asymmetric to the left. Mild facet and ligamentous hypertrophy. Mild spinal stenosis with mild asymmetric narrowing of the left lateral recess and left foramen. L3-4: Chronic degenerative disc disease with loss of disc height, endplate osteophytes and moderate facet hypertrophy. Resulting grade 1 anterolisthesis contributes to moderate multifactorial spinal stenosis, moderate lateral recess and mild foraminal narrowing bilaterally. L4-5: Probable previous posterior decompression. Chronic loss of disc height with annular disc bulging and endplate osteophytes asymmetric to the right. Mild right foraminal narrowing. L5-S1: Probable posterior decompression. Mild disc bulging and endplate osteophytes asymmetric to the left. Bilateral facet hypertrophy, worse on the right. Mild left foraminal narrowing. IMPRESSION: 1. Acute fractures of the T2 and T12 vertebral bodies, as described. The T2 fracture appears pathologic. 2. Interval pathologic fracture of the L2 vertebral body compared with CTs from September. This fracture does not appear acute. No definite acute fractures in the lumbar spine. 3. Multiple lytic lesions are again noted throughout the thoracolumbar spine and sacrum consistent with multiple myeloma or metastatic disease. Correlate with previous biopsy results. 4. Progressive degenerative changes at L1-2 with a new extruded disc fragment containing vacuum phenomenon extending inferiorly behind the L2 vertebral body on the left. This may contribute to left-sided nerve root encroachment. 5. Aortic Atherosclerosis (ICD10-I70.0) and Emphysema (ICD10-J43.9). Electronically Signed   By: Richardean Sale M.D.   On: 02/20/2020 14:13   US RENAL  Result Date: 02/20/2020  CLINICAL DATA:  86 year old male with acute renal insufficiency. EXAM: RENAL / URINARY TRACT ULTRASOUND COMPLETE COMPARISON:  None. FINDINGS:  Evaluation is limited due to inability of the patient to cooperate with exam. Right Kidney: Renal measurements: 9.8 x 4.7 x 5.5 cm = volume: 132 mL. There is mild parenchyma atrophy. Mild increased echogenicity. There may be minimal hydronephrosis. No shadowing stone. Left Kidney: Renal measurements: 10.0 x 5.1 x 5.2 cm = volume: 140 mL. Mild parenchyma atrophy. Mild increased echogenicity. No hydronephrosis or shadowing stone. Bladder: Appears normal for degree of bladder distention. Bilateral ureteral jets noted. Other: None. IMPRESSION: 1. Mild increased echogenicity in keeping with chronic kidney disease. 2. Possible minimal right hydronephrosis.  No shadowing stone. Electronically Signed   By: Anner Crete M.D.   On: 02/20/2020 17:17        Scheduled Meds: . carbidopa-levodopa  1 tablet Oral 5 X Daily  . finasteride  5 mg Oral Daily  . lidocaine  2 patch Transdermal Q24H  . loratadine  10 mg Oral Daily  . niacin  500 mg Oral BH-q7a  . pantoprazole  40 mg Oral Daily  . potassium chloride  40 mEq Oral Once  . potassium chloride  40 mEq Oral Q4H  . rosuvastatin  20 mg Oral QHS  . senna  1 tablet Oral BID  . sertraline  25 mg Oral QHS  . tamsulosin  0.4 mg Oral Daily  . umeclidinium-vilanterol  1 puff Inhalation Daily  . vitamin B-12  1,000 mcg Oral Daily   Continuous Infusions: . lactated ringers with kcl 75 mL/hr at 02/21/20 0300  . magnesium sulfate bolus IVPB       LOS: 1 day    Time spent: 35 mins    Wyvonnia Dusky, MD [facility] Pager [phone]  If 7PM-7AM, please contact night-coverage [email or web address] 02/21/2020, 8:08 AM

## 2020-02-21 NOTE — Care Management Important Message (Signed)
Important Message  Patient Details  Name: Bascom Biel MRN: [id number] Date of Birth: 04-02-1934   Medicare Important Message Given:  N/A - LOS <3 / Initial given by admissions     Dannette Barbara 02/21/2020, 6:31 PM

## 2020-02-21 NOTE — Progress Notes (Signed)
   02/21/20 1025  Vitals  BP 105/70  MAP (mmHg) 80  BP Method Automatic  Pulse Rate 58  Pulse Rate Source Monitor  MEWS COLOR  MEWS Score Color Green  Oxygen Therapy  SpO2 97 %  MEWS Score  MEWS Temp 0  MEWS Systolic 0  MEWS Pulse 0  MEWS RR 0  MEWS LOC 0  MEWS Score 0

## 2020-02-21 NOTE — Progress Notes (Signed)
Patients BP is 81/52, notified Dr. Jimmye Norman ,awaiting new orders at this time. Patient is alert

## 2020-02-21 NOTE — Progress Notes (Signed)
New order for Bolus 1086ml . Started now

## 2020-02-21 NOTE — Progress Notes (Signed)
Patient arrived to the floor via stretcher. In room 145, was placed in bed with 4 nurses to transfer. Patient c/o pain only when you touch patient to sit up or turn. T2*T12 fx. Alert and oriented. Takes meds whole, uses a urinal, and bed pan. Able to make needs known to staff. Bed in low potion with head of bed elevated. Call light with in reach. Bed in low potion . Bilateral ole lower legs 3+ edema. Refuses ted hose.

## 2020-02-21 NOTE — Progress Notes (Signed)
PT Cancellation Note  Patient Details Name: Adam Moss MRN: [id number] DOB: 06/19/1934   Cancelled Treatment:    Reason Eval/Treat Not Completed: Patient not medically ready PT order received. Noted critical low potassium  value of 2.4 which falls outside of the parameters recommended for exertional activity. Will hold PT evaluation at this time and follow up as appropriate.   Minna Merritts, PT, MPT   Percell Locus 02/21/2020, 1:06 PM

## 2020-02-21 NOTE — Progress Notes (Signed)
OT Cancellation Note  Patient Details Name: Adam Moss MRN: [id number] DOB: 1933-10-31   Cancelled Treatment:    Reason Eval/Treat Not Completed: Medical issues which prohibited therapy. Thank you for the OT consult. Order received and chart reviewed. Pt noted to have most recent K+ value of 2.4 which falls outside of the parameters recommended for exertional activity. Will continue to monitor remotely and initiate OT services as available and pt medically appropriate for evaluation.   Shara Blazing, M.S., OTR/L Ascom: [phone] 02/21/20, 11:32 AM

## 2020-02-21 NOTE — NC FL2 (Signed)
[place] LEVEL OF CARE SCREENING TOOL     IDENTIFICATION  Patient Name: Adam Moss Birthdate: 03/31/1934 Sex: male Admission Date (Current Location): 02/20/2020  [place] and [place] Number:  Engineering geologist and Address:  [facility], [address], [place], [place] [address]      Provider Number: [id number]  Attending Physician Name and Address:  Wyvonnia Dusky, MD  Relative Name and Phone Number:  Kennon Portela [phone]    Current Level of Care: Hospital Recommended Level of Care: [place] Prior Approval Number:    Date Approved/Denied:   PASRR Number:    Discharge Plan: SNF    Current Diagnoses: Patient Active Problem List   Diagnosis Date Noted  . Compression fracture of body of thoracic vertebra ([place]) 02/20/2020  . Hyponatremia 02/20/2020  . Hypokalemia 02/20/2020  . AKI (acute kidney injury) ([place]) 02/20/2020  . Pancytopenia ([place]) 02/20/2020  . Constipation 02/20/2020  . Anasarca 09/17/2019  . Venous insufficiency 09/17/2019  . Chronic bronchitis ([place]) 09/17/2019  . UTI (urinary tract infection) 09/11/2019  . Pain due to onychomycosis of toenails of both feet 08/09/2019  . Pincer nail deformity 08/09/2019  . BPH (benign prostatic hyperplasia) 08/02/2019  . Healthcare maintenance 07/06/2019  . Mixed hyperlipidemia 04/24/2019  . Chronic obstructive pulmonary disease ([place]) 04/24/2019  . Multiple myeloma ([place]) 04/24/2019  . Other lymphedema 04/24/2019  . Edema 04/24/2019  . Essential hypertension 04/24/2019  . Lower extremity edema 04/02/2019  . Pathologic fracture 03/30/2019  . Cough 12/04/2015  . Non-seasonal allergic rhinitis 12/04/2015  . Parkinson disease ([place]) 02/21/2015  . Spinal stenosis in cervical region 11/30/2013  . Tremor 11/30/2013  . Patient risk and functional assessment 04/14/2012  . History of actinic keratoses 10/02/2010  . History of nonmelanoma skin cancer  10/02/2010  . Chest pain, unspecified 03/28/2007    Orientation RESPIRATION BLADDER Height & Weight     Self, Time, Situation, Place  Normal Continent Weight: 74.8 kg Height:  '5\' 10"'  (177.8 cm)  BEHAVIORAL SYMPTOMS/MOOD NEUROLOGICAL BOWEL NUTRITION STATUS      Continent Diet (Heart Healthy)  AMBULATORY STATUS COMMUNICATION OF NEEDS Skin   Extensive Assist Verbally Surgical wounds                       Personal Care Assistance Level of Assistance  Dressing, Bathing Bathing Assistance: Limited assistance   Dressing Assistance: Limited assistance     Functional Limitations Info             SPECIAL CARE FACTORS FREQUENCY  PT (By licensed PT), OT (By licensed OT)     PT Frequency: 5 times per week OT Frequency: 5 times per week            Contractures Contractures Info: Not present    Additional Factors Info  Code Status, Allergies Code Status Info: DNR Allergies Info: Carbidopa-levodopa, Oxycodone-acetaminophen, Tyloxapol, Acetaminophen, Pramipexole           Current Medications (02/21/2020):  This is the current hospital active medication list Current Facility-Administered Medications  Medication Dose Route Frequency Provider Last Rate Last Admin  . bisacodyl (DULCOLAX) EC tablet 5 mg  5 mg Oral Daily PRN Nicole Kindred A, DO      . bisacodyl (DULCOLAX) suppository 10 mg  10 mg Rectal Daily PRN Wyvonnia Dusky, MD      . carbidopa-levodopa (SINEMET CR) 50-200 MG per tablet controlled release 1 tablet  1 tablet Oral 5 X  Daily Nicole Kindred A, DO   1 tablet at 02/21/20 0956  . finasteride (PROSCAR) tablet 5 mg  5 mg Oral Daily Nicole Kindred A, DO   5 mg at 02/21/20 0956  . fluticasone (FLONASE) 50 MCG/ACT nasal spray 1 spray  1 spray Each Nare Daily PRN Nicole Kindred A, DO      . ipratropium (ATROVENT) 0.03 % nasal spray 1 spray  1 spray Each Nare TID PRN Nicole Kindred A, DO      . ipratropium-albuterol (DUONEB) 0.5-2.5 (3) MG/3ML nebulizer  solution 3 mL  3 mL Nebulization Q6H PRN Nicole Kindred A, DO      . isosorbide-hydrALAZINE (BIDIL) 20-37.5 MG per tablet 1 tablet  1 tablet Oral TID PRN Nicole Kindred A, DO      . lactated ringers 1,000 mL with potassium chloride 40 mEq infusion   Intravenous Continuous Nicole Kindred A, DO 75 mL/hr at 02/21/20 1110 New Bag at 02/21/20 1110  . lidocaine (LIDODERM) 5 % 2 patch  2 patch Transdermal Q24H Nicole Kindred A, DO      . loratadine (CLARITIN) tablet 10 mg  10 mg Oral Daily Nicole Kindred A, DO   10 mg at 02/21/20 0956  . magnesium sulfate IVPB 1 g 100 mL  1 g Intravenous Once Opyd, Ilene Qua, MD      . morphine 2 MG/ML injection 2 mg  2 mg Intravenous Q2H PRN Nicole Kindred A, DO      . niacin tablet 500 mg  500 mg Oral Marni Griffon, Kelly A, DO   500 mg at 02/21/20 1157  . ondansetron (ZOFRAN) tablet 4 mg  4 mg Oral Q6H PRN Nicole Kindred A, DO       Or  . ondansetron (ZOFRAN) injection 4 mg  4 mg Intravenous Q6H PRN Nicole Kindred A, DO      . oxyCODONE (Oxy IR/ROXICODONE) immediate release tablet 5 mg  5 mg Oral Q4H PRN Nicole Kindred A, DO      . pantoprazole (PROTONIX) EC tablet 40 mg  40 mg Oral Daily Nicole Kindred A, DO   40 mg at 02/21/20 0956  . potassium chloride SA (KLOR-CON) CR tablet 40 mEq  40 mEq Oral Once Laban Emperor, PA-C      . potassium chloride SA (KLOR-CON) CR tablet 40 mEq  40 mEq Oral Q4H Opyd, Ilene Qua, MD   40 mEq at 02/21/20 1105  . rosuvastatin (CRESTOR) tablet 20 mg  20 mg Oral QHS Nicole Kindred A, DO   20 mg at 02/20/20 2300  . senna (SENOKOT) tablet 8.6 mg  1 tablet Oral BID Nicole Kindred A, DO   8.6 mg at 02/21/20 0956  . sertraline (ZOLOFT) tablet 25 mg  25 mg Oral QHS Nicole Kindred A, DO      . sodium phosphate (FLEET) 7-19 GM/118ML enema 1 enema  1 enema Rectal Once Wyvonnia Dusky, MD      . tamsulosin [facility]) capsule 0.4 mg  0.4 mg Oral Daily Nicole Kindred A, DO   0.4 mg at 02/20/20 1714  . traMADol (ULTRAM) tablet 50 mg   50 mg Oral Q8H PRN Nicole Kindred A, DO      . umeclidinium-vilanterol (ANORO ELLIPTA) 62.5-25 MCG/INH 1 puff  1 puff Inhalation Daily Oswald Hillock, RPH   1 puff at 02/21/20 0847  . vitamin B-12 (CYANOCOBALAMIN) tablet 1,000 mcg  1,000 mcg Oral Daily Nicole Kindred A, DO   1,000 mcg at 02/21/20 [phone]  Discharge Medications: Please see discharge summary for a list of discharge medications.  Relevant Imaging Results:  Relevant Lab Results:   Additional Information [id number]  Su Hilt, RN

## 2020-02-21 NOTE — Consult Note (Signed)
[place]  [phone] [phone]  ID: Adam Moss OB: 08-25-1933  MR#: [id number]  AOZ#:[id number]  Patient Care Team: Perrin Maltese, MD as PCP - General (Internal Medicine) Lloyd Huger, MD as Consulting Physician (Hematology and Oncology)  CHIEF COMPLAINT: Multiple myeloma on Revlimid, mechanical fall with multiple compression fractures.  INTERVAL HISTORY: Patient is an 86 year old male who is receiving oral Revlimid for multiple myeloma who presented to the emergency room yesterday after a fall resulting in multiple vertebral compression fractures.  He denies any preceding symptoms or loss of consciousness.  He did not sustain head injury.  Patient reports he is pain-free, but only if he is lying still.  He otherwise feels at his baseline.  He continues to have resting tremor secondary to his Parkinson's, otherwise no neurologic complaints.  He denies any recent fevers or illnesses.  He has a fair appetite, but denies weight loss.  He has no chest pain, shortness of breath, cough, or hemoptysis.  He denies any nausea, vomiting, constipation, or diarrhea.  He has no urinary complaints.  Patient otherwise feels well and offers no further specific complaints today.  REVIEW OF SYSTEMS:   Review of Systems  Constitutional: Positive for malaise/fatigue. Negative for fever and weight loss.  Respiratory: Negative.  Negative for cough, hemoptysis and shortness of breath.   Cardiovascular: Negative.  Negative for chest pain and leg swelling.  Gastrointestinal: Negative.  Negative for abdominal pain, nausea and vomiting.  Genitourinary: Negative.  Negative for dysuria.  Musculoskeletal: Positive for back pain and falls.  Skin: Negative.  Negative for rash.  Neurological: Positive for tremors and weakness. Negative for dizziness, focal weakness and headaches.  Psychiatric/Behavioral: Negative.  The patient is not nervous/anxious.     As per  HPI. Otherwise, a complete review of systems is negative.  PAST MEDICAL HISTORY: Past Medical History:  Diagnosis Date  . Cancer ([place])    skin  . COPD (chronic obstructive pulmonary disease) ([place])   . Hyperlipemia   . Hypertension   . Lymphedema of both lower extremities   . Parkinson disease ([place])   . Parkinsonism ([place]) 02/21/2015  . Spinal stenosis   . Tremor     PAST SURGICAL HISTORY: Past Surgical History:  Procedure Laterality Date  . APPENDECTOMY    . CATARACT EXTRACTION    . CHOLECYSTECTOMY    . TONSILLECTOMY    . VASECTOMY      FAMILY HISTORY: Family History  Problem Relation Age of Onset  . Cancer Mother   . Heart disease Father     ADVANCED DIRECTIVES (Y/N):  '@ADVDIR' @  HEALTH MAINTENANCE: Social History   Tobacco Use  . Smoking status: Never Smoker  . Smokeless tobacco: Never Used  Vaping Use  . Vaping Use: Never used  Substance Use Topics  . Alcohol use: No    Alcohol/week: 0.0 standard drinks  . Drug use: No     Colonoscopy:  PAP:  Bone density:  Lipid panel:  Allergies  Allergen Reactions  . Carbidopa-Levodopa Other (See Comments)    Severe stomach pains, can take rytary  . Oxycodone-Acetaminophen Other (See Comments)    "boils on skin"  . Tyloxapol   . Acetaminophen Rash, Nausea And Vomiting and Hives  . Pramipexole Nausea Only    Current Facility-Administered Medications  Medication Dose Route Frequency Provider Last Rate Last Admin  . bisacodyl (DULCOLAX) EC tablet 5 mg  5 mg Oral Daily PRN Nicole Kindred A, DO      .  bisacodyl (DULCOLAX) suppository 10 mg  10 mg Rectal Daily PRN Wyvonnia Dusky, MD      . carbidopa-levodopa (SINEMET CR) 50-200 MG per tablet controlled release 1 tablet  1 tablet Oral 5 X Daily Nicole Kindred A, DO   1 tablet at 02/21/20 1710  . feeding supplement (ENSURE ENLIVE) (ENSURE ENLIVE) liquid 237 mL  237 mL Oral TID Wyvonnia Dusky, MD      . finasteride (PROSCAR) tablet 5 mg  5 mg Oral Daily  Nicole Kindred A, DO   5 mg at 02/21/20 0956  . fluticasone (FLONASE) 50 MCG/ACT nasal spray 1 spray  1 spray Each Nare Daily PRN Nicole Kindred A, DO      . ipratropium (ATROVENT) 0.03 % nasal spray 1 spray  1 spray Each Nare TID PRN Nicole Kindred A, DO      . ipratropium-albuterol (DUONEB) 0.5-2.5 (3) MG/3ML nebulizer solution 3 mL  3 mL Nebulization Q6H PRN Nicole Kindred A, DO      . isosorbide-hydrALAZINE (BIDIL) 20-37.5 MG per tablet 1 tablet  1 tablet Oral TID PRN Nicole Kindred A, DO      . lactated ringers 1,000 mL with potassium chloride 40 mEq infusion   Intravenous Continuous Nicole Kindred A, DO 75 mL/hr at 02/21/20 1110 New Bag at 02/21/20 1110  . lidocaine (LIDODERM) 5 % 2 patch  2 patch Transdermal Q24H Nicole Kindred A, DO      . loratadine (CLARITIN) tablet 10 mg  10 mg Oral Daily Nicole Kindred A, DO   10 mg at 02/21/20 0956  . magnesium sulfate IVPB 1 g 100 mL  1 g Intravenous Once Opyd, Ilene Qua, MD      . morphine 2 MG/ML injection 2 mg  2 mg Intravenous Q2H PRN Nicole Kindred A, DO      . niacin tablet 500 mg  500 mg Oral Marni Griffon, Kelly A, DO   500 mg at 02/21/20 2446  . ondansetron (ZOFRAN) tablet 4 mg  4 mg Oral Q6H PRN Nicole Kindred A, DO       Or  . ondansetron (ZOFRAN) injection 4 mg  4 mg Intravenous Q6H PRN Nicole Kindred A, DO      . oxyCODONE (Oxy IR/ROXICODONE) immediate release tablet 5 mg  5 mg Oral Q4H PRN Nicole Kindred A, DO      . pantoprazole (PROTONIX) EC tablet 40 mg  40 mg Oral Daily Nicole Kindred A, DO   40 mg at 02/21/20 0956  . potassium chloride SA (KLOR-CON) CR tablet 40 mEq  40 mEq Oral Once Laban Emperor, PA-C      . rosuvastatin (CRESTOR) tablet 20 mg  20 mg Oral QHS Nicole Kindred A, DO   20 mg at 02/20/20 2300  . senna (SENOKOT) tablet 8.6 mg  1 tablet Oral BID Nicole Kindred A, DO   8.6 mg at 02/21/20 0956  . sertraline (ZOLOFT) tablet 25 mg  25 mg Oral QHS Nicole Kindred A, DO      . tamsulosin (FLOMAX) capsule  0.4 mg  0.4 mg Oral Daily Nicole Kindred A, DO   0.4 mg at 02/21/20 1710  . traMADol (ULTRAM) tablet 50 mg  50 mg Oral Q8H PRN Nicole Kindred A, DO   50 mg at 02/21/20 1430  . umeclidinium-vilanterol (ANORO ELLIPTA) 62.5-25 MCG/INH 1 puff  1 puff Inhalation Daily Oswald Hillock, RPH   1 puff at 02/21/20 0847  . vitamin B-12 (CYANOCOBALAMIN) tablet 1,000 mcg  1,000 mcg Oral Daily Nicole Kindred A, DO   1,000 mcg at 02/21/20 0956    OBJECTIVE: Vitals:   02/21/20 1431 02/21/20 1814  BP: (!) 117/58 106/74  Pulse: 73 68  Resp: 17 17  Temp: 98 F (36.7 C) (!) 97.5 F (36.4 C)  SpO2: 95% 98%     Body mass index is 23.66 kg/m.    ECOG FS:3 - Symptomatic, >50% confined to bed  General: Thin, no acute distress. Eyes: Pink conjunctiva, anicteric sclera. HEENT: Normocephalic, moist mucous membranes. Lungs: No audible wheezing or coughing. Heart: Regular rate and rhythm. Abdomen: Soft, nontender, no obvious distention. Musculoskeletal: No edema, cyanosis, or clubbing. Neuro: Alert, answering all questions appropriately. Cranial nerves grossly intact. Skin: No rashes or petechiae noted. Psych: Normal affect.  LAB RESULTS:  Lab Results  Component Value Date   NA 131 (L) 02/21/2020   K 2.4 (LL) 02/21/2020   CL 84 (L) 02/21/2020   CO2 38 (H) 02/21/2020   GLUCOSE 109 (H) 02/21/2020   BUN 34 (H) 02/21/2020   CREATININE 1.58 (H) 02/21/2020   CALCIUM 8.2 (L) 02/21/2020   PROT 6.8 01/22/2020   ALBUMIN 3.7 01/22/2020   AST 80 (H) 01/22/2020   ALT 12 01/22/2020   ALKPHOS 82 01/22/2020   BILITOT 0.8 01/22/2020   GFRNONAA 40 (L) 02/21/2020   GFRAA 46 (L) 02/21/2020    Lab Results  Component Value Date   WBC 2.2 (L) 02/21/2020   NEUTROABS 1.9 01/22/2020   HGB 8.1 (L) 02/21/2020   HCT 25.2 (L) 02/21/2020   MCV 93.0 02/21/2020   PLT 55 (L) 02/21/2020     STUDIES: DG Chest 2 View  Result Date: 02/20/2020 CLINICAL DATA:  [place] today with back pain EXAM: CHEST - 2 VIEW  COMPARISON:  09/10/2019 FINDINGS: Heart size upper limits of normal. Tortuous aorta as seen previously. The lungs are clear. No infiltrate, collapse, edema or effusion. Old right clavicle fracture. Compression fracture of T12 is visible as shown at lumbar radiography. IMPRESSION: No active cardiopulmonary disease. T12 compression fracture visible, better shown at lumbar radiography. Old right clavicle fracture. Electronically Signed   By: Nelson Chimes M.D.   On: 02/20/2020 13:59   DG Lumbar Spine Complete  Result Date: 02/20/2020 CLINICAL DATA:  Pain following fall EXAM: LUMBAR SPINE - COMPLETE 4+ VIEW COMPARISON:  CT abdomen and pelvis including bony reformats April 17, 2019 FINDINGS: Frontal, lateral, spot lumbosacral lateral, and bilateral oblique views were obtained. There are 5 non-rib-bearing lumbar type vertebral bodies. There is lumbar dextroscoliosis. There is moderate wedging of the T12 vertebral body which was not present previously and may well be acute. There is more subtle anterior wedging of the L2 vertebral body. No other fractures are appreciable. There is 2 mm of anterolisthesis of L3 on L4, stable. No new spondylolisthesis. There is moderately severe disc space narrowing at L3-4, L4-5, and L5-S1, also present previously. There is facet osteoarthritic change at L3-4, L4-5, and L5-S1 bilaterally. IMPRESSION: Scoliosis. Probable acute anterior wedge fractures at T12 and L2, more severe at T12. Stable slight anterolisthesis of L3 on L4. No new spondylolisthesis. Multilevel arthropathy, most severe at L3-4, L4-5, L5-S1, also noted on previous CT examination. Electronically Signed   By: Lowella Grip III M.D.   On: 02/20/2020 12:46   DG Pelvis 1-2 Views  Result Date: 02/20/2020 CLINICAL DATA:  Pain following fall EXAM: PELVIS - 1-2 VIEW COMPARISON:  None. FINDINGS: There is no evidence of pelvic fracture or dislocation. There is  moderate symmetric narrowing of each hip joint. No erosive  change. IMPRESSION: Symmetric narrowing each hip joint.  No fracture or dislocation. Electronically Signed   By: Lowella Grip III M.D.   On: 02/20/2020 12:47   CT Head Wo Contrast  Result Date: 02/20/2020 CLINICAL DATA:  Head trauma history of Parkinson's EXAM: CT HEAD WITHOUT CONTRAST CT CERVICAL SPINE WITHOUT CONTRAST TECHNIQUE: Multidetector CT imaging of the head and cervical spine was performed following the standard protocol without intravenous contrast. Multiplanar CT image reconstructions of the cervical spine were also generated. COMPARISON:  None FINDINGS: CT HEAD FINDINGS Brain: No evidence of acute infarction, hemorrhage, hydrocephalus, extra-axial collection or mass lesion/mass effect. Signs of atrophy and chronic microvascular ischemic changes in the deep white matter. Vascular: No hyperdense vessel or unexpected calcification. Skull: Normal. Negative for fracture or focal lesion. Sinuses/Orbits: Visualized paranasal sinuses and orbits are unremarkable. Other: None. CT CERVICAL SPINE FINDINGS Alignment: Mild reversal of normal cervical lordosis in the setting of multilevel degenerative change. Degenerative changes greatest in the mid to upper cervical spine. Skull base and vertebrae: No signs of fracture of the cervical spine or skull base, cranial cervical junction. Signs of pathologic fracture, of the T2 vertebral body. Cortical margins suggest acute process. No retropulsion of fracture elements with fracture extending through the posterior vertebral body, associated with lucent lesion, likely metastatic disease in this location. Lucent area seen in this location on previous imaging evaluation. Soft tissues and spinal canal: No prevertebral fluid or swelling. No visible canal hematoma. Disc levels: Multilevel degenerative changes greatest at with disc space loss, near complete at C3-4, C4-5, C5-6 and C6-7. Facet arthropathy noted throughout the cervical spine greatest at C3-4 on the RIGHT  Upper chest: Lung apices are clear. Subacute to chronic RIGHT-sided first rib fracture. Other: None IMPRESSION: 1. No acute intracranial abnormality. 2. Signs of atrophy and chronic microvascular ischemic changes. 3. Pathologic fracture of T2 likely acute, through presumed metastatic focus in the T2 vertebral body. No additional fractures noted in the visualized portion of the thoracic spine or within the cervical spine. 4. Multilevel degenerative changes of the cervical spine. 5. Subacute to chronic RIGHT-sided first rib fracture. Electronically Signed   By: Zetta Bills M.D.   On: 02/20/2020 11:56   CT Cervical Spine Wo Contrast  Result Date: 02/20/2020 CLINICAL DATA:  Head trauma history of Parkinson's EXAM: CT HEAD WITHOUT CONTRAST CT CERVICAL SPINE WITHOUT CONTRAST TECHNIQUE: Multidetector CT imaging of the head and cervical spine was performed following the standard protocol without intravenous contrast. Multiplanar CT image reconstructions of the cervical spine were also generated. COMPARISON:  None FINDINGS: CT HEAD FINDINGS Brain: No evidence of acute infarction, hemorrhage, hydrocephalus, extra-axial collection or mass lesion/mass effect. Signs of atrophy and chronic microvascular ischemic changes in the deep white matter. Vascular: No hyperdense vessel or unexpected calcification. Skull: Normal. Negative for fracture or focal lesion. Sinuses/Orbits: Visualized paranasal sinuses and orbits are unremarkable. Other: None. CT CERVICAL SPINE FINDINGS Alignment: Mild reversal of normal cervical lordosis in the setting of multilevel degenerative change. Degenerative changes greatest in the mid to upper cervical spine. Skull base and vertebrae: No signs of fracture of the cervical spine or skull base, cranial cervical junction. Signs of pathologic fracture, of the T2 vertebral body. Cortical margins suggest acute process. No retropulsion of fracture elements with fracture extending through the posterior  vertebral body, associated with lucent lesion, likely metastatic disease in this location. Lucent area seen in this location on previous imaging evaluation. Soft  tissues and spinal canal: No prevertebral fluid or swelling. No visible canal hematoma. Disc levels: Multilevel degenerative changes greatest at with disc space loss, near complete at C3-4, C4-5, C5-6 and C6-7. Facet arthropathy noted throughout the cervical spine greatest at C3-4 on the RIGHT Upper chest: Lung apices are clear. Subacute to chronic RIGHT-sided first rib fracture. Other: None IMPRESSION: 1. No acute intracranial abnormality. 2. Signs of atrophy and chronic microvascular ischemic changes. 3. Pathologic fracture of T2 likely acute, through presumed metastatic focus in the T2 vertebral body. No additional fractures noted in the visualized portion of the thoracic spine or within the cervical spine. 4. Multilevel degenerative changes of the cervical spine. 5. Subacute to chronic RIGHT-sided first rib fracture. Electronically Signed   By: Zetta Bills M.D.   On: 02/20/2020 11:56   CT Thoracic Spine Wo Contrast  Result Date: 02/20/2020 CLINICAL DATA:  Mid to low back pain post fall. EXAM: CT THORACIC AND LUMBAR SPINE WITHOUT CONTRAST TECHNIQUE: Multidetector CT imaging of the thoracic and lumbar spine was performed without contrast. Multiplanar CT image reconstructions were also generated. COMPARISON:  Lumbar spine radiographs today. Cervical spine CT today, chest CT 04/01/2019 and abdominopelvic CT 04/17/2019 FINDINGS: CT THORACIC SPINE FINDINGS Alignment: Mildly progressive upper thoracic kyphosis without focal angulation or significant listhesis. Vertebrae: As seen on today's cervical spine CT, there is a mild fracture of the T2 vertebral body resulting in less than 25% loss of vertebral body height and no osseous retropulsion. This fracture appears pathologic with an underlying lytic lesion as seen on prior chest CT. There is also an  acute nearly horizontal fracture through the T12 vertebral body, resulting in 25% loss of vertebral body height and 4 mm of osseous retropulsion. This fracture does not show any definite pathologic features. Nonacute fractures of the right clavicle and right 1st rib anteriorly are noted. Possible lytic lesion and pathologic fracture of the left 7th rib near the costovertebral junction (image 64/2). Additional lytic lesions are noted within the T5 and T8 vertebral bodies, suspicious for metastatic disease or myeloma. Paraspinal and other soft tissues: No significant paraspinal hematoma. Mild aortic and branch vessel atherosclerosis. Emphysema and bibasilar atelectasis noted. Disc levels: Relatively mild multilevel thoracic spondylosis with disc space narrowing and anterior osteophytes. There is a small right foraminal disc protrusion at T6-7. No high-grade foraminal narrowing. CT LUMBAR SPINE FINDINGS Segmentation: There are 5 lumbar type vertebral bodies. Alignment: Grade 1 degenerative anterolisthesis at L3-4 and minimal convex right scoliosis. Vertebrae: Lytic lesions are again noted within the L2 vertebral body and upper sacrum, the latter measuring up to 3.1 cm. These have sclerotic margins and may be partially treated. Other lytic lesions are present within the right aspect of the sacrum. These were all present and described on previous abdominal CT. There is a pathologic fracture of the L2 vertebral body with 30% loss of vertebral body height, asymmetric to the left. This fracture does not appear acute. No definite acute fractures in the lumbar spine. Paraspinal and other soft tissues: No enlarging paraspinal soft tissue masses or hematomas. Mild aortic and branch vessel atherosclerosis. Disc levels: L1-2: Progressive loss of disc height with a new extruded left paracentral disc fragment containing vacuum phenomenon extending inferiorly behind the L2 vertebral body. This exerts mass effect on the thecal sac  and may contribute to left-sided nerve root encroachment. Facet and ligamentous hypertrophy contribute to mild left foraminal narrowing. L2-3: Chronic degenerative disc disease with loss of disc height and endplate osteophytes asymmetric to  the left. Mild facet and ligamentous hypertrophy. Mild spinal stenosis with mild asymmetric narrowing of the left lateral recess and left foramen. L3-4: Chronic degenerative disc disease with loss of disc height, endplate osteophytes and moderate facet hypertrophy. Resulting grade 1 anterolisthesis contributes to moderate multifactorial spinal stenosis, moderate lateral recess and mild foraminal narrowing bilaterally. L4-5: Probable previous posterior decompression. Chronic loss of disc height with annular disc bulging and endplate osteophytes asymmetric to the right. Mild right foraminal narrowing. L5-S1: Probable posterior decompression. Mild disc bulging and endplate osteophytes asymmetric to the left. Bilateral facet hypertrophy, worse on the right. Mild left foraminal narrowing. IMPRESSION: 1. Acute fractures of the T2 and T12 vertebral bodies, as described. The T2 fracture appears pathologic. 2. Interval pathologic fracture of the L2 vertebral body compared with CTs from September. This fracture does not appear acute. No definite acute fractures in the lumbar spine. 3. Multiple lytic lesions are again noted throughout the thoracolumbar spine and sacrum consistent with multiple myeloma or metastatic disease. Correlate with previous biopsy results. 4. Progressive degenerative changes at L1-2 with a new extruded disc fragment containing vacuum phenomenon extending inferiorly behind the L2 vertebral body on the left. This may contribute to left-sided nerve root encroachment. 5. Aortic Atherosclerosis (ICD10-I70.0) and Emphysema (ICD10-J43.9). Electronically Signed   By: Richardean Sale M.D.   On: 02/20/2020 14:13   CT Lumbar Spine Wo Contrast  Result Date:  02/20/2020 CLINICAL DATA:  Mid to low back pain post fall. EXAM: CT THORACIC AND LUMBAR SPINE WITHOUT CONTRAST TECHNIQUE: Multidetector CT imaging of the thoracic and lumbar spine was performed without contrast. Multiplanar CT image reconstructions were also generated. COMPARISON:  Lumbar spine radiographs today. Cervical spine CT today, chest CT 04/01/2019 and abdominopelvic CT 04/17/2019 FINDINGS: CT THORACIC SPINE FINDINGS Alignment: Mildly progressive upper thoracic kyphosis without focal angulation or significant listhesis. Vertebrae: As seen on today's cervical spine CT, there is a mild fracture of the T2 vertebral body resulting in less than 25% loss of vertebral body height and no osseous retropulsion. This fracture appears pathologic with an underlying lytic lesion as seen on prior chest CT. There is also an acute nearly horizontal fracture through the T12 vertebral body, resulting in 25% loss of vertebral body height and 4 mm of osseous retropulsion. This fracture does not show any definite pathologic features. Nonacute fractures of the right clavicle and right 1st rib anteriorly are noted. Possible lytic lesion and pathologic fracture of the left 7th rib near the costovertebral junction (image 64/2). Additional lytic lesions are noted within the T5 and T8 vertebral bodies, suspicious for metastatic disease or myeloma. Paraspinal and other soft tissues: No significant paraspinal hematoma. Mild aortic and branch vessel atherosclerosis. Emphysema and bibasilar atelectasis noted. Disc levels: Relatively mild multilevel thoracic spondylosis with disc space narrowing and anterior osteophytes. There is a small right foraminal disc protrusion at T6-7. No high-grade foraminal narrowing. CT LUMBAR SPINE FINDINGS Segmentation: There are 5 lumbar type vertebral bodies. Alignment: Grade 1 degenerative anterolisthesis at L3-4 and minimal convex right scoliosis. Vertebrae: Lytic lesions are again noted within the L2  vertebral body and upper sacrum, the latter measuring up to 3.1 cm. These have sclerotic margins and may be partially treated. Other lytic lesions are present within the right aspect of the sacrum. These were all present and described on previous abdominal CT. There is a pathologic fracture of the L2 vertebral body with 30% loss of vertebral body height, asymmetric to the left. This fracture does not appear acute. No  definite acute fractures in the lumbar spine. Paraspinal and other soft tissues: No enlarging paraspinal soft tissue masses or hematomas. Mild aortic and branch vessel atherosclerosis. Disc levels: L1-2: Progressive loss of disc height with a new extruded left paracentral disc fragment containing vacuum phenomenon extending inferiorly behind the L2 vertebral body. This exerts mass effect on the thecal sac and may contribute to left-sided nerve root encroachment. Facet and ligamentous hypertrophy contribute to mild left foraminal narrowing. L2-3: Chronic degenerative disc disease with loss of disc height and endplate osteophytes asymmetric to the left. Mild facet and ligamentous hypertrophy. Mild spinal stenosis with mild asymmetric narrowing of the left lateral recess and left foramen. L3-4: Chronic degenerative disc disease with loss of disc height, endplate osteophytes and moderate facet hypertrophy. Resulting grade 1 anterolisthesis contributes to moderate multifactorial spinal stenosis, moderate lateral recess and mild foraminal narrowing bilaterally. L4-5: Probable previous posterior decompression. Chronic loss of disc height with annular disc bulging and endplate osteophytes asymmetric to the right. Mild right foraminal narrowing. L5-S1: Probable posterior decompression. Mild disc bulging and endplate osteophytes asymmetric to the left. Bilateral facet hypertrophy, worse on the right. Mild left foraminal narrowing. IMPRESSION: 1. Acute fractures of the T2 and T12 vertebral bodies, as described.  The T2 fracture appears pathologic. 2. Interval pathologic fracture of the L2 vertebral body compared with CTs from September. This fracture does not appear acute. No definite acute fractures in the lumbar spine. 3. Multiple lytic lesions are again noted throughout the thoracolumbar spine and sacrum consistent with multiple myeloma or metastatic disease. Correlate with previous biopsy results. 4. Progressive degenerative changes at L1-2 with a new extruded disc fragment containing vacuum phenomenon extending inferiorly behind the L2 vertebral body on the left. This may contribute to left-sided nerve root encroachment. 5. Aortic Atherosclerosis (ICD10-I70.0) and Emphysema (ICD10-J43.9). Electronically Signed   By: Richardean Sale M.D.   On: 02/20/2020 14:13   US RENAL  Result Date: 02/20/2020 CLINICAL DATA:  86 year old male with acute renal insufficiency. EXAM: RENAL / URINARY TRACT ULTRASOUND COMPLETE COMPARISON:  None. FINDINGS: Evaluation is limited due to inability of the patient to cooperate with exam. Right Kidney: Renal measurements: 9.8 x 4.7 x 5.5 cm = volume: 132 mL. There is mild parenchyma atrophy. Mild increased echogenicity. There may be minimal hydronephrosis. No shadowing stone. Left Kidney: Renal measurements: 10.0 x 5.1 x 5.2 cm = volume: 140 mL. Mild parenchyma atrophy. Mild increased echogenicity. No hydronephrosis or shadowing stone. Bladder: Appears normal for degree of bladder distention. Bilateral ureteral jets noted. Other: None. IMPRESSION: 1. Mild increased echogenicity in keeping with chronic kidney disease. 2. Possible minimal right hydronephrosis.  No shadowing stone. Electronically Signed   By: Anner Crete M.D.   On: 02/20/2020 17:17    ASSESSMENT: Multiple myeloma on Revlimid, mechanical fall with multiple compression fractures.  PLAN:    1.  Compression fractures: Patient not a kyphoplasty candidate.  Continue symptomatic control. 2.  Multiple myeloma: Patient  receiving single agent oral Revlimid and has been instructed to hold treatment until he is evaluated in clinic.  Patient's appointment for Friday, February 22, 2020 will be canceled and rescheduled for 1 to 2 weeks. 3.  Pancytopenia: Chronic and relatively unchanged.  Secondary to underlying myeloma as well as treatment with Revlimid.  Hold Revlimid as above. 4.  Hypokalemia: Improving.  Replace as needed. 5.  Renal insufficiency: Patient's creatinine is normal at baseline.  Improving and now down to 1.58. 6.  Hyponatremia: Chronic and unchanged.  Monitor.  Appreciate consult, will follow.    Lloyd Huger, MD   02/21/2020 8:34 PM

## 2020-02-21 NOTE — Progress Notes (Signed)
Patient given Bisacodyl suppository , and also given Fleet enema, Large amount of loose stool noted. Not formed /

## 2020-02-22 ENCOUNTER — Inpatient Hospital Stay: Payer: Medicare Other

## 2020-02-22 ENCOUNTER — Inpatient Hospital Stay: Payer: Medicare Other | Admitting: Oncology

## 2020-02-22 LAB — PHOSPHORUS: Phosphorus: 2.1 mg/dL — ABNORMAL LOW (ref 2.5–4.6)

## 2020-02-22 LAB — BASIC METABOLIC PANEL
Anion gap: 9 (ref 5–15)
Anion gap: 10 (ref 5–15)
BUN: 34 mg/dL — ABNORMAL HIGH (ref 8–23)
BUN: 24 mg/dL — ABNORMAL HIGH (ref 8–23)
CO2: 38 mmol/L — ABNORMAL HIGH (ref 22–32)
CO2: 32 mmol/L (ref 22–32)
Calcium: 8.2 mg/dL — ABNORMAL LOW (ref 8.9–10.3)
Calcium: 8.3 mg/dL — ABNORMAL LOW (ref 8.9–10.3)
Chloride: 84 mmol/L — ABNORMAL LOW (ref 98–111)
Chloride: 91 mmol/L — ABNORMAL LOW (ref 98–111)
Creatinine, Ser: 1.58 mg/dL — ABNORMAL HIGH (ref 0.61–1.24)
Creatinine, Ser: 1.4 mg/dL — ABNORMAL HIGH (ref 0.61–1.24)
GFR calc Af Amer: 46 mL/min — ABNORMAL LOW (ref >60)
GFR calc Af Amer: 53 mL/min — ABNORMAL LOW (ref >60)
GFR calc non Af Amer: 40 mL/min — ABNORMAL LOW (ref >60)
GFR calc non Af Amer: 46 mL/min — ABNORMAL LOW (ref >60)
Glucose, Bld: 109 mg/dL — ABNORMAL HIGH (ref 70–99)
Glucose, Bld: 123 mg/dL — ABNORMAL HIGH (ref 70–99)
Potassium: 2.4 mmol/L — CL (ref 3.5–5.1)
Potassium: 3.2 mmol/L — ABNORMAL LOW (ref 3.5–5.1)
Sodium: 131 mmol/L — ABNORMAL LOW (ref 135–145)
Sodium: 133 mmol/L — ABNORMAL LOW (ref 135–145)

## 2020-02-22 LAB — CBC
HCT: 25.7 % — ABNORMAL LOW (ref 39.0–52.0)
Hemoglobin: 8.2 g/dL — ABNORMAL LOW (ref 13.0–17.0)
MCH: 30.5 pg (ref 26.0–34.0)
MCHC: 31.9 g/dL (ref 30.0–36.0)
MCV: 95.5 fL (ref 80.0–100.0)
Platelets: 44 10*3/uL — ABNORMAL LOW (ref 150–400)
RBC: 2.69 MIL/uL — ABNORMAL LOW (ref 4.22–5.81)
RDW: 18.8 % — ABNORMAL HIGH (ref 11.5–15.5)
WBC: 1.7 10*3/uL — ABNORMAL LOW (ref 4.0–10.5)
nRBC: 0 % (ref 0.0–0.2)

## 2020-02-22 LAB — MAGNESIUM: Magnesium: 1.7 mg/dL (ref 1.7–2.4)

## 2020-02-22 MED ORDER — SODIUM PHOSPHATES 45 MMOLE/15ML IV SOLN
30.0000 mmol | Freq: Once | INTRAVENOUS | Status: AC
  Administered 2020-02-22: 30 mmol via INTRAVENOUS
  Filled 2020-02-22: qty 10

## 2020-02-22 MED ORDER — POTASSIUM CHLORIDE CRYS ER 20 MEQ PO TBCR
40.0000 meq | EXTENDED_RELEASE_TABLET | Freq: Two times a day (BID) | ORAL | Status: AC
  Administered 2020-02-22 (×2): 40 meq via ORAL
  Filled 2020-02-22 (×3): qty 2

## 2020-02-22 NOTE — Plan of Care (Signed)
  Problem: Education: Goal: Knowledge of General Education information will improve Description Including pain rating scale, medication(s)/side effects and non-pharmacologic comfort measures Outcome: Progressing   Problem: Clinical Measurements: Goal: Will remain free from infection Outcome: Progressing Goal: Respiratory complications will improve Outcome: Progressing Goal: Cardiovascular complication will be avoided Outcome: Progressing   

## 2020-02-22 NOTE — Evaluation (Signed)
Physical Therapy Evaluation Patient Details Name: Adam Moss MRN: [id number] DOB: [birth year] Today's Date: 02/22/2020   History of Present Illness  Adam Moss is a 86 y.o. male with medical history of multiple myeloma, Parkinson's disease, hypertension, hyperlipidemia, COPD, chronic lower extremity edema with venous insufficiency who presented to the ED after a mechanical fall at home. Imaging notes T12 compression fracture. Not a candidate for kyphoplasty.     Clinical Impression  Pt received in supine position following lunch.  Pt notes he is in no pain upon arrival, but has pain when moving around as noted below.  Pt notes he has had a bowel movement twice, once last night, one this AM.    Pt was able to perform exercises while in bed as noted below without difficulty.  Pt notes that he has extensive swelling of the B LE, with R>L.  Pitting edema noted in B LE, again with R>L.  Pt self reports that he would like to get into the chair, however does not think that he can do so due to pain.  Pt was able to transfer with minA to side of bed with use of UEs for support and extended time.  Therapist was needed to rotate pt so he LEs were square at the EOB.  Pt then able to perform seated marches, however was declined to perform transfer to chair.  Nursing came in to perform IV and requested pt to remain in bed for IV to be placed.  Pt was able to transfer back to bed and minA from therapist and nurse were utilized to slide pt up in bed for comfort.  Pt was left with nursing staff and all other basic needs within reach.  Pt will benefit from PT services in a SNF setting upon discharge to safely address deficits listed in patient problem list for decreased caregiver assistance and eventual return to PLOF.       Follow Up Recommendations SNF    Equipment Recommendations  None recommended by PT    Recommendations for Other Services       Precautions / Restrictions Precautions  Precautions: Fall Precaution Booklet Issued: No Restrictions Weight Bearing Restrictions: No      Mobility  Bed Mobility Overal bed mobility: Needs Assistance Bed Mobility: Supine to Sit     Supine to sit: Min assist     General bed mobility comments: pt requires extended time for processing movements and utilizing UEs for getting to EOB.  Pt declined to attempt to stand from seated position.  Transfers                    Ambulation/Gait                Stairs            Wheelchair Mobility    Modified Rankin (Stroke Patients Only)       Balance Overall balance assessment: History of Falls;Needs assistance Sitting-balance support: Bilateral upper extremity supported Sitting balance-Leahy Scale: Poor   Postural control: Other (comment) (Kyphotic posture and is unable to sit with good posture.)                                   Pertinent Vitals/Pain Pain Assessment: Faces Faces Pain Scale: Hurts little more Pain Location: back Pain Descriptors / Indicators: Aching;Grimacing;Guarding Pain Intervention(s): Limited activity within patient's tolerance;Monitored during session;Repositioned    Home Living Family/patient  expects to be discharged to:: Skilled nursing facility                 Additional Comments: Pt lives in [place] apartment, has 1 meal provided daily; pt reports plan to go to [facility] for STR which he reports has already been set up    Prior Function Level of Independence: Needs assistance   Gait / Transfers Assistance Needed: amb with rollator MI per pt report  ADL's / Homemaking Assistance Needed: pt reports PCA assists with shower 2x/wk, indep with med mgt, light meal prep, dressing; staff provide transportation  Comments: Pt reports 2 falls in past 12 months 2/2 shuffling feet     Hand Dominance   Dominant Hand: Left    Extremity/Trunk Assessment   Upper Extremity Assessment Upper  Extremity Assessment: Generalized weakness    Lower Extremity Assessment Lower Extremity Assessment: Generalized weakness    Cervical / Trunk Assessment Cervical / Trunk Assessment: Other exceptions Cervical / Trunk Exceptions: T12 compression fx, old T2 fx  Communication   Communication: HOH  Cognition Arousal/Alertness: Awake/alert Behavior During Therapy: WFL for tasks assessed/performed Overall Cognitive Status: Within Functional Limits for tasks assessed                                        General Comments      Exercises Total Joint Exercises Ankle Circles/Pumps: AROM;Strengthening;Both;10 reps;Supine Quad Sets: AROM;Strengthening;Both;10 reps;Supine Gluteal Sets: AROM;Strengthening;Both;10 reps;Supine Hip ABduction/ADduction: AROM;Strengthening;Both;10 reps;Supine Straight Leg Raises: AROM;Strengthening;Both;10 reps;Supine Long Arc Quad: AROM;Strengthening;Both;10 reps;Seated   Assessment/Plan    PT Assessment Patient needs continued PT services  PT Problem List Decreased strength;Decreased mobility;Decreased range of motion;Decreased activity tolerance;Decreased balance;Decreased knowledge of use of DME;Pain       PT Treatment Interventions DME instruction;Gait training;Functional mobility training;Therapeutic activities;Therapeutic exercise;Balance training;Patient/family education    PT Goals (Current goals can be found in the Care Plan section)  Acute Rehab PT Goals Patient Stated Goal: go to STR at [facility] to help get better PT Goal Formulation: With patient Time For Goal Achievement: 03/07/20 Potential to Achieve Goals: Fair    Frequency Min 2X/week   Barriers to discharge        Co-evaluation               AM-PAC PT "6 Clicks" Mobility  Outcome Measure Help needed turning from your back to your side while in a flat bed without using bedrails?: A Lot Help needed moving from lying on your back to sitting on the side of a  flat bed without using bedrails?: A Lot Help needed moving to and from a bed to a chair (including a wheelchair)?: A Lot Help needed standing up from a chair using your arms (e.g., wheelchair or bedside chair)?: A Lot Help needed to walk in hospital room?: A Lot Help needed climbing 3-5 steps with a railing? : A Lot 6 Click Score: 12    End of Session Equipment Utilized During Treatment: Gait belt Activity Tolerance: Patient limited by pain Patient left: in bed;with call bell/phone within reach;with nursing/sitter in room Nurse Communication: Mobility status PT Visit Diagnosis: Unsteadiness on feet (R26.81);Other abnormalities of gait and mobility (R26.89);Repeated falls (R29.6);Muscle weakness (generalized) (M62.81);History of falling (Z91.81);Difficulty in walking, not elsewhere classified (R26.2);Pain Pain - part of body:  (spine)    Time: 6967-8938 PT Time Calculation (min) (ACUTE ONLY): 33 min   Charges:  PT Evaluation $PT Eval Low Complexity: 1 Low PT Treatments $Therapeutic Exercise: 8-22 mins        Joshua Robbins, PT, DPT 02/22/20, 3:02 PM   

## 2020-02-22 NOTE — Progress Notes (Signed)
Patient refused potassium at this time, states he wants it after lunch , will re-attempt to give at that time

## 2020-02-22 NOTE — Care Management Important Message (Signed)
Important Message  Patient Details  Name: Adam Moss MRN: [id number] Date of Birth: 11-May-1934   Medicare Important Message Given:  N/A - LOS <3 / Initial given by admissions     [name] 02/22/2020, 8:03 AM

## 2020-02-22 NOTE — Progress Notes (Signed)
PROGRESS NOTE    Adam Moss  GYF:[id number] DOB: October 21, 1933 DOA: 02/20/2020 PCP: Perrin Maltese, MD    Assessment & Plan:   Principal Problem:   Compression fracture of body of thoracic vertebra [facility]) Active Problems:   Parkinson disease ([place])   Lower extremity edema   Mixed hyperlipidemia   Chronic obstructive pulmonary disease ([place])   Multiple myeloma ([place])   Essential hypertension   BPH (benign prostatic hyperplasia)   Venous insufficiency   Hyponatremia   Hypokalemia   AKI (acute kidney injury) ([place])   Pancytopenia ([facility])   Constipation   Compression fracture of body of thoracic vertebra: present on admission status post mechanical fall in the setting of multiple myeloma with lytic skeletal lesions. Continue on lidocaine patch. IV morphine & oxycodone prn for pain. PT/OT recs SNF  Hyponatremia: etiology unclear, possibly hypervolemic. Trending up. Will continue to monitor   Hypokalemia: KCl repleted again. Mg is WNL again today. Will continue to montior   Hypophosphatemia: sodium phosp ordered. Will continue to monitor   AKI: etiology unclear, likely pre-renal. Continue on IVFs. Cr continues to trend down daily. Will continue to monitor   Chronic constipation: secondary to Parkinson's. Continue on dulcolax, enema prn   LE edema: likely secondary to venous insufficiency. Continue to hold lasix & spironolactone secondary to AKI.   Multiple myeloma: currently on lenalidomide (21 days on, 7 off).  This cycle started on 01/31/20, will hold lenalidomide for now defer to oncology.  Dr. Grayland Ormond is the pt's oncologist   HTN: continue to hold home lasix and spironolactone secondary to AKI.  Hydralazine prn   Pancytopenia: present on admission. Likely secondary to chemotherapy and multiple myeloma.  Continue to hold aspirin  Parkinson disease: continue on home dose of carbidopa levodopa. Likely contributing to pt's intermittent confusion/delirium  Hyperlipidemia:  continue on statin   COPD: w/o exacerbation. Continue on bronchodilators   BPH: continue on home dose of tamsulosin and finasteride    DVT prophylaxis: SCDs Code Status: DNR Family Communication: discussed pt's care w/ pt's care, [name], and answered her questions  Disposition Plan:  D/c to SNF  Status is: Inpatient  Remains inpatient appropriate because:Unsafe d/c plan, will likely go to SNF in 2-3 days    Dispo: The patient is from: Home              Anticipated d/c is to: SNF              Anticipated d/c date is: 2 days              Patient currently is medically stable to d/c.    Consultants:      Procedures:    Antimicrobials:    Subjective: Pt c/o fatigue.  Objective: Vitals:   02/21/20 1431 02/21/20 1814 02/22/20 0011 02/22/20 0735  BP: (!) 117/58 106/74 (!) 95/59 100/67  Pulse: 73 68 75 71  Resp: _0 Temp: 98 F (36.7 C) (!) 97.5 F (36.4 C) 98 F (36.7 C) (!) 97.4 F (36.3 C)  TempSrc: Oral Oral Oral Oral  SpO2: 95% 98% 98% 96%  Weight:      Height:        Intake/Output Summary (Last 24 hours) at 02/22/2020 0807 Last data filed at 02/22/2020 0540 Gross per 24 hour  Intake --  Output 200 ml  Net -200 ml   Filed Weights   02/20/20 0850  Weight: 74.8 kg    Examination:  General exam: Appears calm  but uncomfortable  Respiratory system: Clear breath sounds b/l. No wheezes Cardiovascular system: S1 & S2 +. No rubs, gallops or clicks. B/l LE 2+ pitting edema  Gastrointestinal system: Abdomen is nondistended, soft and nontender. Normal bowel sounds heard. Central nervous system: Alert and oriented. Moves all 4 extremities Psychiatry: Judgement and insight appear normal. Flat mood and affect     Data Reviewed: I have personally reviewed following labs and imaging studies  CBC: Recent Labs  Lab 02/20/20 1316 02/21/20 0523 02/22/20 0416  WBC 2.9* 2.2* 1.7*  HGB 9.7* 8.1* 8.2*  HCT 28.7* 25.2* 25.7*  MCV 91.7 93.0 95.5   PLT 67* 55* 44*   Basic Metabolic Panel: Recent Labs  Lab 02/20/20 1316 02/20/20 2209 02/21/20 0523 02/22/20 0416  NA 127* 130* 131* 133*  K <2.0* 2.1* 2.4* 3.2*  CL 77* 80* 84* 91*  CO2 32 37* 38* 32  GLUCOSE 111* 123* 109* 123*  BUN 40* 35* 34* 24*  CREATININE 1.96* 1.69* 1.58* 1.40*  CALCIUM 9.8 8.6* 8.2* 8.3*  MG 2.0  --  1.8 1.7  PHOS  --   --   --  2.1*   GFR: Estimated Creatinine Clearance: 40.6 mL/min (A) (by C-G formula based on SCr of 1.4 mg/dL (H)). Liver Function Tests: No results for input(s): AST, ALT, ALKPHOS, BILITOT, PROT, ALBUMIN in the last 168 hours. No results for input(s): LIPASE, AMYLASE in the last 168 hours. No results for input(s): AMMONIA in the last 168 hours. Coagulation Profile: No results for input(s): INR, PROTIME in the last 168 hours. Cardiac Enzymes: No results for input(s): CKTOTAL, CKMB, CKMBINDEX, TROPONINI in the last 168 hours. BNP (last 3 results) No results for input(s): PROBNP in the last 8760 hours. HbA1C: No results for input(s): HGBA1C in the last 72 hours. CBG: Recent Labs  Lab 02/21/20 0830  GLUCAP 113*   Lipid Profile: No results for input(s): CHOL, HDL, LDLCALC, TRIG, CHOLHDL, LDLDIRECT in the last 72 hours. Thyroid Function Tests: Recent Labs    02/20/20 1514  TSH 0.780   Anemia Panel: No results for input(s): VITAMINB12, FOLATE, FERRITIN, TIBC, IRON, RETICCTPCT in the last 72 hours. Sepsis Labs: No results for input(s): PROCALCITON, LATICACIDVEN in the last 168 hours.  Recent Results (from the past 240 hour(s))  SARS Coronavirus 2 by RT PCR (hospital order, performed in [facility] hospital lab) Nasopharyngeal     Status: None   Collection Time: 02/20/20  1:16 PM   Specimen: Nasopharyngeal  Result Value Ref Range Status   SARS Coronavirus 2 NEGATIVE NEGATIVE Final    Comment: (NOTE) SARS-CoV-2 target nucleic acids are NOT DETECTED.  The SARS-CoV-2 RNA is generally detectable in upper and  lower respiratory specimens during the acute phase of infection. The lowest concentration of SARS-CoV-2 viral copies this assay can detect is 250 copies / mL. A negative result does not preclude SARS-CoV-2 infection and should not be used as the sole basis for treatment or other patient management decisions.  A negative result may occur with improper specimen collection / handling, submission of specimen other than nasopharyngeal swab, presence of viral mutation(s) within the areas targeted by this assay, and inadequate number of viral copies (<250 copies / mL). A negative result must be combined with clinical observations, patient history, and epidemiological information.  Fact Sheet for Patients:   StrictlyIdeas.no  Fact Sheet for Healthcare Providers: BankingDealers.co.za  This test is not yet approved or  cleared by the [place] FDA and has been authorized for  detection and/or diagnosis of SARS-CoV-2 by FDA under an Emergency Use Authorization (EUA).  This EUA will remain in effect (meaning this test can be used) for the duration of the COVID-19 declaration under Section 564(b)(1) of the Act, 21 U.S.C. section 360bbb-3(b)(1), unless the authorization is terminated or revoked sooner.  Performed at [facility], [address] [place], [place] [address]          Radiology Studies: DG Chest 2 View  Result Date: 02/20/2020 CLINICAL DATA:  [place] today with back pain EXAM: CHEST - 2 VIEW COMPARISON:  09/10/2019 FINDINGS: Heart size upper limits of normal. Tortuous aorta as seen previously. The lungs are clear. No infiltrate, collapse, edema or effusion. Old right clavicle fracture. Compression fracture of T12 is visible as shown at lumbar radiography. IMPRESSION: No active cardiopulmonary disease. T12 compression fracture visible, better shown at lumbar radiography. Old right clavicle fracture. Electronically Signed    By: Nelson Chimes M.D.   On: 02/20/2020 13:59   DG Lumbar Spine Complete  Result Date: 02/20/2020 CLINICAL DATA:  Pain following fall EXAM: LUMBAR SPINE - COMPLETE 4+ VIEW COMPARISON:  CT abdomen and pelvis including bony reformats April 17, 2019 FINDINGS: Frontal, lateral, spot lumbosacral lateral, and bilateral oblique views were obtained. There are 5 non-rib-bearing lumbar type vertebral bodies. There is lumbar dextroscoliosis. There is moderate wedging of the T12 vertebral body which was not present previously and may well be acute. There is more subtle anterior wedging of the L2 vertebral body. No other fractures are appreciable. There is 2 mm of anterolisthesis of L3 on L4, stable. No new spondylolisthesis. There is moderately severe disc space narrowing at L3-4, L4-5, and L5-S1, also present previously. There is facet osteoarthritic change at L3-4, L4-5, and L5-S1 bilaterally. IMPRESSION: Scoliosis. Probable acute anterior wedge fractures at T12 and L2, more severe at T12. Stable slight anterolisthesis of L3 on L4. No new spondylolisthesis. Multilevel arthropathy, most severe at L3-4, L4-5, L5-S1, also noted on previous CT examination. Electronically Signed   By: Lowella Grip III M.D.   On: 02/20/2020 12:46   DG Pelvis 1-2 Views  Result Date: 02/20/2020 CLINICAL DATA:  Pain following fall EXAM: PELVIS - 1-2 VIEW COMPARISON:  None. FINDINGS: There is no evidence of pelvic fracture or dislocation. There is moderate symmetric narrowing of each hip joint. No erosive change. IMPRESSION: Symmetric narrowing each hip joint.  No fracture or dislocation. Electronically Signed   By: Lowella Grip III M.D.   On: 02/20/2020 12:47   CT Head Wo Contrast  Result Date: 02/20/2020 CLINICAL DATA:  Head trauma history of Parkinson's EXAM: CT HEAD WITHOUT CONTRAST CT CERVICAL SPINE WITHOUT CONTRAST TECHNIQUE: Multidetector CT imaging of the head and cervical spine was performed following the standard  protocol without intravenous contrast. Multiplanar CT image reconstructions of the cervical spine were also generated. COMPARISON:  None FINDINGS: CT HEAD FINDINGS Brain: No evidence of acute infarction, hemorrhage, hydrocephalus, extra-axial collection or mass lesion/mass effect. Signs of atrophy and chronic microvascular ischemic changes in the deep white matter. Vascular: No hyperdense vessel or unexpected calcification. Skull: Normal. Negative for fracture or focal lesion. Sinuses/Orbits: Visualized paranasal sinuses and orbits are unremarkable. Other: None. CT CERVICAL SPINE FINDINGS Alignment: Mild reversal of normal cervical lordosis in the setting of multilevel degenerative change. Degenerative changes greatest in the mid to upper cervical spine. Skull base and vertebrae: No signs of fracture of the cervical spine or skull base, cranial cervical junction. Signs of pathologic fracture, of the T2  vertebral body. Cortical margins suggest acute process. No retropulsion of fracture elements with fracture extending through the posterior vertebral body, associated with lucent lesion, likely metastatic disease in this location. Lucent area seen in this location on previous imaging evaluation. Soft tissues and spinal canal: No prevertebral fluid or swelling. No visible canal hematoma. Disc levels: Multilevel degenerative changes greatest at with disc space loss, near complete at C3-4, C4-5, C5-6 and C6-7. Facet arthropathy noted throughout the cervical spine greatest at C3-4 on the RIGHT Upper chest: Lung apices are clear. Subacute to chronic RIGHT-sided first rib fracture. Other: None IMPRESSION: 1. No acute intracranial abnormality. 2. Signs of atrophy and chronic microvascular ischemic changes. 3. Pathologic fracture of T2 likely acute, through presumed metastatic focus in the T2 vertebral body. No additional fractures noted in the visualized portion of the thoracic spine or within the cervical spine. 4.  Multilevel degenerative changes of the cervical spine. 5. Subacute to chronic RIGHT-sided first rib fracture. Electronically Signed   By: Zetta Bills M.D.   On: 02/20/2020 11:56   CT Cervical Spine Wo Contrast  Result Date: 02/20/2020 CLINICAL DATA:  Head trauma history of Parkinson's EXAM: CT HEAD WITHOUT CONTRAST CT CERVICAL SPINE WITHOUT CONTRAST TECHNIQUE: Multidetector CT imaging of the head and cervical spine was performed following the standard protocol without intravenous contrast. Multiplanar CT image reconstructions of the cervical spine were also generated. COMPARISON:  None FINDINGS: CT HEAD FINDINGS Brain: No evidence of acute infarction, hemorrhage, hydrocephalus, extra-axial collection or mass lesion/mass effect. Signs of atrophy and chronic microvascular ischemic changes in the deep white matter. Vascular: No hyperdense vessel or unexpected calcification. Skull: Normal. Negative for fracture or focal lesion. Sinuses/Orbits: Visualized paranasal sinuses and orbits are unremarkable. Other: None. CT CERVICAL SPINE FINDINGS Alignment: Mild reversal of normal cervical lordosis in the setting of multilevel degenerative change. Degenerative changes greatest in the mid to upper cervical spine. Skull base and vertebrae: No signs of fracture of the cervical spine or skull base, cranial cervical junction. Signs of pathologic fracture, of the T2 vertebral body. Cortical margins suggest acute process. No retropulsion of fracture elements with fracture extending through the posterior vertebral body, associated with lucent lesion, likely metastatic disease in this location. Lucent area seen in this location on previous imaging evaluation. Soft tissues and spinal canal: No prevertebral fluid or swelling. No visible canal hematoma. Disc levels: Multilevel degenerative changes greatest at with disc space loss, near complete at C3-4, C4-5, C5-6 and C6-7. Facet arthropathy noted throughout the cervical spine  greatest at C3-4 on the RIGHT Upper chest: Lung apices are clear. Subacute to chronic RIGHT-sided first rib fracture. Other: None IMPRESSION: 1. No acute intracranial abnormality. 2. Signs of atrophy and chronic microvascular ischemic changes. 3. Pathologic fracture of T2 likely acute, through presumed metastatic focus in the T2 vertebral body. No additional fractures noted in the visualized portion of the thoracic spine or within the cervical spine. 4. Multilevel degenerative changes of the cervical spine. 5. Subacute to chronic RIGHT-sided first rib fracture. Electronically Signed   By: Zetta Bills M.D.   On: 02/20/2020 11:56   CT Thoracic Spine Wo Contrast  Result Date: 02/20/2020 CLINICAL DATA:  Mid to low back pain post fall. EXAM: CT THORACIC AND LUMBAR SPINE WITHOUT CONTRAST TECHNIQUE: Multidetector CT imaging of the thoracic and lumbar spine was performed without contrast. Multiplanar CT image reconstructions were also generated. COMPARISON:  Lumbar spine radiographs today. Cervical spine CT today, chest CT 04/01/2019 and abdominopelvic CT 04/17/2019 FINDINGS: CT  THORACIC SPINE FINDINGS Alignment: Mildly progressive upper thoracic kyphosis without focal angulation or significant listhesis. Vertebrae: As seen on today's cervical spine CT, there is a mild fracture of the T2 vertebral body resulting in less than 25% loss of vertebral body height and no osseous retropulsion. This fracture appears pathologic with an underlying lytic lesion as seen on prior chest CT. There is also an acute nearly horizontal fracture through the T12 vertebral body, resulting in 25% loss of vertebral body height and 4 mm of osseous retropulsion. This fracture does not show any definite pathologic features. Nonacute fractures of the right clavicle and right 1st rib anteriorly are noted. Possible lytic lesion and pathologic fracture of the left 7th rib near the costovertebral junction (image 64/2). Additional lytic lesions are  noted within the T5 and T8 vertebral bodies, suspicious for metastatic disease or myeloma. Paraspinal and other soft tissues: No significant paraspinal hematoma. Mild aortic and branch vessel atherosclerosis. Emphysema and bibasilar atelectasis noted. Disc levels: Relatively mild multilevel thoracic spondylosis with disc space narrowing and anterior osteophytes. There is a small right foraminal disc protrusion at T6-7. No high-grade foraminal narrowing. CT LUMBAR SPINE FINDINGS Segmentation: There are 5 lumbar type vertebral bodies. Alignment: Grade 1 degenerative anterolisthesis at L3-4 and minimal convex right scoliosis. Vertebrae: Lytic lesions are again noted within the L2 vertebral body and upper sacrum, the latter measuring up to 3.1 cm. These have sclerotic margins and may be partially treated. Other lytic lesions are present within the right aspect of the sacrum. These were all present and described on previous abdominal CT. There is a pathologic fracture of the L2 vertebral body with 30% loss of vertebral body height, asymmetric to the left. This fracture does not appear acute. No definite acute fractures in the lumbar spine. Paraspinal and other soft tissues: No enlarging paraspinal soft tissue masses or hematomas. Mild aortic and branch vessel atherosclerosis. Disc levels: L1-2: Progressive loss of disc height with a new extruded left paracentral disc fragment containing vacuum phenomenon extending inferiorly behind the L2 vertebral body. This exerts mass effect on the thecal sac and may contribute to left-sided nerve root encroachment. Facet and ligamentous hypertrophy contribute to mild left foraminal narrowing. L2-3: Chronic degenerative disc disease with loss of disc height and endplate osteophytes asymmetric to the left. Mild facet and ligamentous hypertrophy. Mild spinal stenosis with mild asymmetric narrowing of the left lateral recess and left foramen. L3-4: Chronic degenerative disc disease with  loss of disc height, endplate osteophytes and moderate facet hypertrophy. Resulting grade 1 anterolisthesis contributes to moderate multifactorial spinal stenosis, moderate lateral recess and mild foraminal narrowing bilaterally. L4-5: Probable previous posterior decompression. Chronic loss of disc height with annular disc bulging and endplate osteophytes asymmetric to the right. Mild right foraminal narrowing. L5-S1: Probable posterior decompression. Mild disc bulging and endplate osteophytes asymmetric to the left. Bilateral facet hypertrophy, worse on the right. Mild left foraminal narrowing. IMPRESSION: 1. Acute fractures of the T2 and T12 vertebral bodies, as described. The T2 fracture appears pathologic. 2. Interval pathologic fracture of the L2 vertebral body compared with CTs from September. This fracture does not appear acute. No definite acute fractures in the lumbar spine. 3. Multiple lytic lesions are again noted throughout the thoracolumbar spine and sacrum consistent with multiple myeloma or metastatic disease. Correlate with previous biopsy results. 4. Progressive degenerative changes at L1-2 with a new extruded disc fragment containing vacuum phenomenon extending inferiorly behind the L2 vertebral body on the left. This may contribute to left-sided  nerve root encroachment. 5. Aortic Atherosclerosis (ICD10-I70.0) and Emphysema (ICD10-J43.9). Electronically Signed   By: Richardean Sale M.D.   On: 02/20/2020 14:13   CT Lumbar Spine Wo Contrast  Result Date: 02/20/2020 CLINICAL DATA:  Mid to low back pain post fall. EXAM: CT THORACIC AND LUMBAR SPINE WITHOUT CONTRAST TECHNIQUE: Multidetector CT imaging of the thoracic and lumbar spine was performed without contrast. Multiplanar CT image reconstructions were also generated. COMPARISON:  Lumbar spine radiographs today. Cervical spine CT today, chest CT 04/01/2019 and abdominopelvic CT 04/17/2019 FINDINGS: CT THORACIC SPINE FINDINGS Alignment: Mildly  progressive upper thoracic kyphosis without focal angulation or significant listhesis. Vertebrae: As seen on today's cervical spine CT, there is a mild fracture of the T2 vertebral body resulting in less than 25% loss of vertebral body height and no osseous retropulsion. This fracture appears pathologic with an underlying lytic lesion as seen on prior chest CT. There is also an acute nearly horizontal fracture through the T12 vertebral body, resulting in 25% loss of vertebral body height and 4 mm of osseous retropulsion. This fracture does not show any definite pathologic features. Nonacute fractures of the right clavicle and right 1st rib anteriorly are noted. Possible lytic lesion and pathologic fracture of the left 7th rib near the costovertebral junction (image 64/2). Additional lytic lesions are noted within the T5 and T8 vertebral bodies, suspicious for metastatic disease or myeloma. Paraspinal and other soft tissues: No significant paraspinal hematoma. Mild aortic and branch vessel atherosclerosis. Emphysema and bibasilar atelectasis noted. Disc levels: Relatively mild multilevel thoracic spondylosis with disc space narrowing and anterior osteophytes. There is a small right foraminal disc protrusion at T6-7. No high-grade foraminal narrowing. CT LUMBAR SPINE FINDINGS Segmentation: There are 5 lumbar type vertebral bodies. Alignment: Grade 1 degenerative anterolisthesis at L3-4 and minimal convex right scoliosis. Vertebrae: Lytic lesions are again noted within the L2 vertebral body and upper sacrum, the latter measuring up to 3.1 cm. These have sclerotic margins and may be partially treated. Other lytic lesions are present within the right aspect of the sacrum. These were all present and described on previous abdominal CT. There is a pathologic fracture of the L2 vertebral body with 30% loss of vertebral body height, asymmetric to the left. This fracture does not appear acute. No definite acute fractures in  the lumbar spine. Paraspinal and other soft tissues: No enlarging paraspinal soft tissue masses or hematomas. Mild aortic and branch vessel atherosclerosis. Disc levels: L1-2: Progressive loss of disc height with a new extruded left paracentral disc fragment containing vacuum phenomenon extending inferiorly behind the L2 vertebral body. This exerts mass effect on the thecal sac and may contribute to left-sided nerve root encroachment. Facet and ligamentous hypertrophy contribute to mild left foraminal narrowing. L2-3: Chronic degenerative disc disease with loss of disc height and endplate osteophytes asymmetric to the left. Mild facet and ligamentous hypertrophy. Mild spinal stenosis with mild asymmetric narrowing of the left lateral recess and left foramen. L3-4: Chronic degenerative disc disease with loss of disc height, endplate osteophytes and moderate facet hypertrophy. Resulting grade 1 anterolisthesis contributes to moderate multifactorial spinal stenosis, moderate lateral recess and mild foraminal narrowing bilaterally. L4-5: Probable previous posterior decompression. Chronic loss of disc height with annular disc bulging and endplate osteophytes asymmetric to the right. Mild right foraminal narrowing. L5-S1: Probable posterior decompression. Mild disc bulging and endplate osteophytes asymmetric to the left. Bilateral facet hypertrophy, worse on the right. Mild left foraminal narrowing. IMPRESSION: 1. Acute fractures of the T2 and  T12 vertebral bodies, as described. The T2 fracture appears pathologic. 2. Interval pathologic fracture of the L2 vertebral body compared with CTs from September. This fracture does not appear acute. No definite acute fractures in the lumbar spine. 3. Multiple lytic lesions are again noted throughout the thoracolumbar spine and sacrum consistent with multiple myeloma or metastatic disease. Correlate with previous biopsy results. 4. Progressive degenerative changes at L1-2 with a new  extruded disc fragment containing vacuum phenomenon extending inferiorly behind the L2 vertebral body on the left. This may contribute to left-sided nerve root encroachment. 5. Aortic Atherosclerosis (ICD10-I70.0) and Emphysema (ICD10-J43.9). Electronically Signed   By: Richardean Sale M.D.   On: 02/20/2020 14:13   US RENAL  Result Date: 02/20/2020 CLINICAL DATA:  86 year old male with acute renal insufficiency. EXAM: RENAL / URINARY TRACT ULTRASOUND COMPLETE COMPARISON:  None. FINDINGS: Evaluation is limited due to inability of the patient to cooperate with exam. Right Kidney: Renal measurements: 9.8 x 4.7 x 5.5 cm = volume: 132 mL. There is mild parenchyma atrophy. Mild increased echogenicity. There may be minimal hydronephrosis. No shadowing stone. Left Kidney: Renal measurements: 10.0 x 5.1 x 5.2 cm = volume: 140 mL. Mild parenchyma atrophy. Mild increased echogenicity. No hydronephrosis or shadowing stone. Bladder: Appears normal for degree of bladder distention. Bilateral ureteral jets noted. Other: None. IMPRESSION: 1. Mild increased echogenicity in keeping with chronic kidney disease. 2. Possible minimal right hydronephrosis.  No shadowing stone. Electronically Signed   By: Anner Crete M.D.   On: 02/20/2020 17:17        Scheduled Meds: . carbidopa-levodopa  1 tablet Oral 5 X Daily  . feeding supplement (ENSURE ENLIVE)  237 mL Oral TID  . finasteride  5 mg Oral Daily  . lidocaine  2 patch Transdermal Q24H  . loratadine  10 mg Oral Daily  . niacin  500 mg Oral BH-q7a  . pantoprazole  40 mg Oral Daily  . potassium chloride  40 mEq Oral BID  . rosuvastatin  20 mg Oral QHS  . senna  1 tablet Oral BID  . sertraline  25 mg Oral QHS  . tamsulosin  0.4 mg Oral Daily  . umeclidinium-vilanterol  1 puff Inhalation Daily  . vitamin B-12  1,000 mcg Oral Daily   Continuous Infusions: . lactated ringers with kcl 75 mL/hr at 02/21/20 2236  . magnesium sulfate bolus IVPB    . sodium  phosphate  Dextrose 5% IVPB       LOS: 2 days    Time spent: 33 mins    Wyvonnia Dusky, MD [facility] Pager [phone]  If 7PM-7AM, please contact night-coverage [email or web address] 02/22/2020, 8:07 AM

## 2020-02-22 NOTE — Progress Notes (Signed)
Patient noted to be in bed , accidentally pulled right wrist IV out , a pool of blood in bed, Patient changed , and tele box changed. IV consult placed , will notify Dr. Jimmye Norman

## 2020-02-22 NOTE — Evaluation (Signed)
Occupational Therapy Evaluation Patient Details Name: Adam Moss MRN: [id number] DOB: January 05, 1934 Today's Date: 02/22/2020    History of Present Illness Adam Moss is a 86 y.o. male with medical history of multiple myeloma, Parkinson's disease, hypertension, hyperlipidemia, COPD, chronic lower extremity edema with venous insufficiency who presented to the ED after a mechanical fall at home. Imaging notes T12 compression fracture. Not a candidate for kyphoplasty.   Clinical Impression   Pt was seen for OT evaluation this date. Prior to hospital admission, pt was ambulating with modified independence using rollator for short distances, indep with simple meal prep, med mgt, and dressing. Pt reports having PCA assist with showering 2x/wk. Pt lives in an [place] apartment at [place] and plans to return to [place] for their [place]. Pt/environment prepared and positioned to attempt bed mobility efforts and once room set up complete and ready to attempt, pt reports he is unable to attempt at this time 2/2 pain and requests pain meds. RN notified. Set up required for self feeding by opening packets/containers. Currently pt demonstrates impairments as described below (See OT problem list) which functionally limit his ability to perform ADL/self-care tasks. Pt currently requires mod-max assist for ADL tasks. Anticipate significant assist for bed mobility and ADL transfers. Pt would benefit from skilled OT to address noted impairments and functional limitations (see below for any additional details) in order to maximize safety and independence while minimizing falls risk and caregiver burden. Upon hospital discharge, recommend STR to maximize pt safety and return to PLOF.     Follow Up Recommendations  SNF    Equipment Recommendations  3 in 1 bedside commode    Recommendations for Other Services       Precautions / Restrictions Precautions Precautions: Fall;Back Precaution Booklet Issued: Yes  (comment) Precaution Comments: no formal back precautions but provided as strategies for improved functional performance and optimal pain mgt during ADL/mobility tasks Restrictions Weight Bearing Restrictions: No      Mobility Bed Mobility               General bed mobility comments: pt/environment set up and then pt declined to attempt 2/2 back pain  Transfers                      Balance Overall balance assessment: History of Falls                                         ADL either performed or assessed with clinical judgement   ADL Overall ADL's : Needs assistance/impaired Eating/Feeding: Bed level;Set up                                     General ADL Comments: Given limitations during session, anticipate Mod-Max Assist for LB ADL, Min-Mod UB ADL, Max A for toilet transfer to [place] Vision/History: Wears glasses Wears Glasses: At all times (does not have with him) Patient Visual Report: No change from baseline       Perception     Praxis      Pertinent Vitals/Pain Pain Assessment: Faces Faces Pain Scale: Hurts little more Pain Location: with any mobility attempts Pain Descriptors / Indicators: Aching;Grimacing;Guarding Pain Intervention(s): Limited activity within patient's tolerance;Monitored during session;Patient requesting pain meds-RN notified;Repositioned  Hand Dominance Left   Extremity/Trunk Assessment Upper Extremity Assessment Upper Extremity Assessment: Generalized weakness   Lower Extremity Assessment Lower Extremity Assessment: Generalized weakness (pt reports lymphedema bilat with BLE "soreness")   Cervical / Trunk Assessment Cervical / Trunk Assessment: Other exceptions Cervical / Trunk Exceptions: T12 compression fx, old T2 fx   Communication Communication Communication: HOH   Cognition Arousal/Alertness: Awake/alert Behavior During Therapy: WFL for tasks  assessed/performed Overall Cognitive Status: Within Functional Limits for tasks assessed                                     General Comments       Exercises Other Exercises Other Exercises: pt educated in general back precautions as guidelines for movement strategies to support optimal pain mgt during ADL and mobility efforts, pt reports having "going through all that before"; handout provided to support recall   Shoulder Instructions      Home Living Family/patient expects to be discharged to:: Skilled nursing facility                                 Additional Comments: Pt lives in [place] apartment, has 1 meal provided daily; pt reports plan to go to [facility] for STR which he reports has already been set up      Prior Functioning/Environment Level of Independence: Needs assistance  Gait / Transfers Assistance Needed: amb with rollator MI per pt report ADL's / Homemaking Assistance Needed: pt reports PCA assists with shower 2x/wk, indep with med mgt, light meal prep, dressing; staff provide transportation   Comments: Pt reports 2 falls in past 12 months 2/2 shuffling feet        OT Problem List: Decreased strength;Decreased coordination;Pain;Decreased activity tolerance;Impaired balance (sitting and/or standing);Decreased knowledge of use of DME or AE;Impaired UE functional use      OT Treatment/Interventions: Self-care/ADL training;Therapeutic exercise;Therapeutic activities;Neuromuscular education;Energy conservation;DME and/or AE instruction;Patient/family education;Balance training    OT Goals(Current goals can be found in the care plan section) Acute Rehab OT Goals Patient Stated Goal: go to STR at [facility] to help get better OT Goal Formulation: With patient Time For Goal Achievement: 03/07/20 Potential to Achieve Goals: Good ADL Goals Pt Will Perform Lower Body Dressing: with min assist;with adaptive equipment;sitting/lateral  leans;with mod assist Pt Will Transfer to Toilet: with min assist;with mod assist;bedside commode;stand pivot transfer (LRAD for amb) Additional ADL Goal #1: Pt will perform bed mobility using learned strategies to minimize back pain (e.g., log roll) requiring Min A. Additional ADL Goal #2: Pt will verbalize 2+ strategies to support improved independence with ADL tasks while minimizing back pain.  OT Frequency: Min 1X/week   Barriers to D/C:            Co-evaluation              AM-PAC OT "6 Clicks" Daily Activity     Outcome Measure Help from another person eating meals?: None Help from another person taking care of personal grooming?: A Little Help from another person toileting, which includes using toliet, bedpan, or urinal?: A Lot Help from another person bathing (including washing, rinsing, drying)?: A Lot Help from another person to put on and taking off regular upper body clothing?: A Lot Help from another person to put on and taking off regular lower body clothing?: A Lot 6  Click Score: 15   End of Session Nurse Communication: Patient requests pain meds  Activity Tolerance: Patient limited by pain Patient left: in bed;with call bell/phone within reach;with bed alarm set  OT Visit Diagnosis: Other abnormalities of gait and mobility (R26.89);Repeated falls (R29.6);Muscle weakness (generalized) (M62.81)                Time: 2992-4268 OT Time Calculation (min): 32 min Charges:  OT General Charges $OT Visit: 1 Visit OT Evaluation $OT Eval Moderate Complexity: 1 Mod OT Treatments $Self Care/Home Management : 8-22 mins  Jeni Salles, MPH, MS, OTR/L ascom [phone] 02/22/20, 9:37 AM

## 2020-02-23 LAB — BASIC METABOLIC PANEL
Anion gap: 9 (ref 5–15)
BUN: 20 mg/dL (ref 8–23)
CO2: 28 mmol/L (ref 22–32)
Calcium: 8 mg/dL — ABNORMAL LOW (ref 8.9–10.3)
Chloride: 92 mmol/L — ABNORMAL LOW (ref 98–111)
Creatinine, Ser: 1.21 mg/dL (ref 0.61–1.24)
GFR calc Af Amer: >60 mL/min (ref >60)
GFR calc non Af Amer: 55 mL/min — ABNORMAL LOW (ref >60)
Glucose, Bld: 121 mg/dL — ABNORMAL HIGH (ref 70–99)
Potassium: 3.9 mmol/L (ref 3.5–5.1)
Sodium: 129 mmol/L — ABNORMAL LOW (ref 135–145)

## 2020-02-23 LAB — CBC
HCT: 23.8 % — ABNORMAL LOW (ref 39.0–52.0)
Hemoglobin: 8 g/dL — ABNORMAL LOW (ref 13.0–17.0)
MCH: 31 pg (ref 26.0–34.0)
MCHC: 33.6 g/dL (ref 30.0–36.0)
MCV: 92.2 fL (ref 80.0–100.0)
Platelets: 39 10*3/uL — ABNORMAL LOW (ref 150–400)
RBC: 2.58 MIL/uL — ABNORMAL LOW (ref 4.22–5.81)
RDW: 18.6 % — ABNORMAL HIGH (ref 11.5–15.5)
WBC: 1.9 10*3/uL — ABNORMAL LOW (ref 4.0–10.5)
nRBC: 0 % (ref 0.0–0.2)

## 2020-02-23 LAB — GLUCOSE, CAPILLARY: Glucose-Capillary: 150 mg/dL — ABNORMAL HIGH (ref 70–99)

## 2020-02-23 LAB — PHOSPHORUS: Phosphorus: 3.2 mg/dL (ref 2.5–4.6)

## 2020-02-23 LAB — MAGNESIUM: Magnesium: 1.4 mg/dL — ABNORMAL LOW (ref 1.7–2.4)

## 2020-02-23 MED ORDER — MAGNESIUM SULFATE 2 GM/50ML IV SOLN
2.0000 g | Freq: Once | INTRAVENOUS | Status: AC
  Administered 2020-02-23: 2 g via INTRAVENOUS
  Filled 2020-02-23: qty 50

## 2020-02-23 NOTE — Plan of Care (Signed)
  Problem: Education: Goal: Knowledge of General Education information will improve Description: Including pain rating scale, medication(s)/side effects and non-pharmacologic comfort measures Outcome: Progressing   Problem: Clinical Measurements: Goal: Respiratory complications will improve Outcome: Progressing Goal: Cardiovascular complication will be avoided Outcome: Progressing   

## 2020-02-23 NOTE — Progress Notes (Signed)
PROGRESS NOTE    Adam Moss  NVB:[id number] DOB: [birth year] DOA: 02/20/2020 PCP: Perrin Maltese, MD    Assessment & Plan:   Principal Problem:   Compression fracture of body of thoracic vertebra [facility]) Active Problems:   Parkinson disease ([place])   Lower extremity edema   Mixed hyperlipidemia   Chronic obstructive pulmonary disease ([place])   Multiple myeloma ([place])   Essential hypertension   BPH (benign prostatic hyperplasia)   Venous insufficiency   Hyponatremia   Hypokalemia   AKI (acute kidney injury) ([place])   Pancytopenia ([facility])   Constipation   Compression fracture of body of thoracic vertebra: present on admission status post mechanical fall in the setting of multiple myeloma with lytic skeletal lesions. Continue on lidocaine patch. IV morphine & oxycodone prn for pain. PT/OT recs SNF  Hyponatremia: etiology unclear, possibly hypervolemic. Labile. Will continue to monitor   Hypokalemia: WNL today. Will continue to montior   Hypophosphatemia: WNL today. Will continue to monitor   Hypomagnesemia: mg sulfate ordered. Will continue to monitor   AKI: etiology unclear, likely pre-renal. Continue on IVFs. Cr is improving daily. Will continue to monitor   Chronic constipation: secondary to Parkinson's. Continue on dulcolax, enema prn   LE edema: likely secondary to venous insufficiency. Continue to hold lasix & spironolactone secondary to AKI.   Multiple myeloma: currently on lenalidomide (21 days on, 7 off).  This cycle started on 01/31/20, will hold lenalidomide for now defer to oncology.  Dr. Grayland Ormond is the pt's oncologist   HTN: continue to hold home lasix and spironolactone secondary to AKI.  Hydralazine prn   Pancytopenia: present on admission. Likely secondary to chem & MM.  Continue to hold aspirin. No need for a transfusion at this time   Parkinson disease: continue on home dose of carbidopa levodopa. Likely contributing to pt's intermittent  confusion/delirium  Hyperlipidemia: continue on statin   COPD: w/o exacerbation. Continue on bronchodilators   BPH: continue on home dose of flomax and finasteride    DVT prophylaxis: SCDs Code Status: DNR Family Communication: Disposition Plan:  D/c to SNF  Status is: Inpatient  Remains inpatient appropriate because:Unsafe d/c plan, will likely go to SNF in 2-3 days    Dispo: The patient is from: Home              Anticipated d/c is to: SNF              Anticipated d/c date is: 2 days              Patient currently is medically stable to d/c.    Consultants:      Procedures:    Antimicrobials:    Subjective: Pt c/o intermittent back pain   Objective: Vitals:   02/22/20 1526 02/23/20 0012 02/23/20 0748 02/23/20 0750  BP: 99/75 1'19/77 93/69 92/66 '  Pulse:  88 72 72  Resp:  18 16   Temp:  97.6 F (36.4 C) 97.7 F (36.5 C)   TempSrc:  Oral Oral   SpO2:  99% 97% 97%  Weight:      Height:        Intake/Output Summary (Last 24 hours) at 02/23/2020 0839 Last data filed at 02/23/2020 0835 Gross per 24 hour  Intake 2675.72 ml  Output 1650 ml  Net 1025.72 ml   Filed Weights   02/20/20 0850  Weight: 74.8 kg    Examination:  General exam: Appears calm but uncomfortable  Respiratory system: Clear breath sounds b/l. No  rales, rhonchi Cardiovascular system: S1 & S2 heard. No rubs, gallops or clicks. B/l LE 2+ pitting edema  Gastrointestinal system: Abdomen is nondistended, soft and nontender. Hypoactive bowel sounds heard. Central nervous system: Alert and oriented. Moves all 4 extremities Psychiatry: Judgement and insight appear normal. Flat mood and affect     Data Reviewed: I have personally reviewed following labs and imaging studies  CBC: Recent Labs  Lab 02/20/20 1316 02/21/20 0523 02/22/20 0416 02/23/20 0418  WBC 2.9* 2.2* 1.7* 1.9*  HGB 9.7* 8.1* 8.2* 8.0*  HCT 28.7* 25.2* 25.7* 23.8*  MCV 91.7 93.0 95.5 92.2  PLT 67* 55* 44* 39*    Basic Metabolic Panel: Recent Labs  Lab 02/20/20 1316 02/20/20 2209 02/21/20 0523 02/22/20 0416 02/23/20 0418  NA 127* 130* 131* 133* 129*  K <2.0* 2.1* 2.4* 3.2* 3.9  CL 77* 80* 84* 91* 92*  CO2 32 37* 38* 32 28  GLUCOSE 111* 123* 109* 123* 121*  BUN 40* 35* 34* 24* 20  CREATININE 1.96* 1.69* 1.58* 1.40* 1.21  CALCIUM 9.8 8.6* 8.2* 8.3* 8.0*  MG 2.0  --  1.8 1.7 1.4*  PHOS  --   --   --  2.1* 3.2   GFR: Estimated Creatinine Clearance: 46.9 mL/min (by C-G formula based on SCr of 1.21 mg/dL). Liver Function Tests: No results for input(s): AST, ALT, ALKPHOS, BILITOT, PROT, ALBUMIN in the last 168 hours. No results for input(s): LIPASE, AMYLASE in the last 168 hours. No results for input(s): AMMONIA in the last 168 hours. Coagulation Profile: No results for input(s): INR, PROTIME in the last 168 hours. Cardiac Enzymes: No results for input(s): CKTOTAL, CKMB, CKMBINDEX, TROPONINI in the last 168 hours. BNP (last 3 results) No results for input(s): PROBNP in the last 8760 hours. HbA1C: No results for input(s): HGBA1C in the last 72 hours. CBG: Recent Labs  Lab 02/21/20 0830  GLUCAP 113*   Lipid Profile: No results for input(s): CHOL, HDL, LDLCALC, TRIG, CHOLHDL, LDLDIRECT in the last 72 hours. Thyroid Function Tests: Recent Labs    02/20/20 1514  TSH 0.780   Anemia Panel: No results for input(s): VITAMINB12, FOLATE, FERRITIN, TIBC, IRON, RETICCTPCT in the last 72 hours. Sepsis Labs: No results for input(s): PROCALCITON, LATICACIDVEN in the last 168 hours.  Recent Results (from the past 240 hour(s))  SARS Coronavirus 2 by RT PCR (hospital order, performed in [facility] hospital lab) Nasopharyngeal     Status: None   Collection Time: 02/20/20  1:16 PM   Specimen: Nasopharyngeal  Result Value Ref Range Status   SARS Coronavirus 2 NEGATIVE NEGATIVE Final    Comment: (NOTE) SARS-CoV-2 target nucleic acids are NOT DETECTED.  The SARS-CoV-2 RNA is generally  detectable in upper and lower respiratory specimens during the acute phase of infection. The lowest concentration of SARS-CoV-2 viral copies this assay can detect is 250 copies / mL. A negative result does not preclude SARS-CoV-2 infection and should not be used as the sole basis for treatment or other patient management decisions.  A negative result may occur with improper specimen collection / handling, submission of specimen other than nasopharyngeal swab, presence of viral mutation(s) within the areas targeted by this assay, and inadequate number of viral copies (<250 copies / mL). A negative result must be combined with clinical observations, patient history, and epidemiological information.  Fact Sheet for Patients:   StrictlyIdeas.no  Fact Sheet for Healthcare Providers: BankingDealers.co.za  This test is not yet approved or  cleared by the  [place] Architectural technologist and has been authorized for detection and/or diagnosis of SARS-CoV-2 by FDA under an Print production planner (EUA).  This EUA will remain in effect (meaning this test can be used) for the duration of the COVID-19 declaration under Section 564(b)(1) of the Act, 21 U.S.C. section 360bbb-3(b)(1), unless the authorization is terminated or revoked sooner.  Performed at [facility], [address], [place], [place] [address]          Radiology Studies: No results found.      Scheduled Meds: . carbidopa-levodopa  1 tablet Oral 5 X Daily  . feeding supplement (ENSURE ENLIVE)  237 mL Oral TID  . finasteride  5 mg Oral Daily  . lidocaine  2 patch Transdermal Q24H  . loratadine  10 mg Oral Daily  . niacin  500 mg Oral BH-q7a  . pantoprazole  40 mg Oral Daily  . rosuvastatin  20 mg Oral QHS  . senna  1 tablet Oral BID  . sertraline  25 mg Oral QHS  . tamsulosin  0.4 mg Oral Daily  . umeclidinium-vilanterol  1 puff Inhalation Daily  . vitamin B-12  1,000  mcg Oral Daily   Continuous Infusions: . lactated ringers with kcl 75 mL/hr at 02/22/20 2012  . magnesium sulfate bolus IVPB       LOS: 3 days    Time spent: 30 mins    Wyvonnia Dusky, MD [facility] Pager [phone]  If 7PM-7AM, please contact night-coverage [email or web address] 02/23/2020, 8:39 AM

## 2020-02-24 LAB — BASIC METABOLIC PANEL
Anion gap: 9 (ref 5–15)
BUN: 18 mg/dL (ref 8–23)
CO2: 26 mmol/L (ref 22–32)
Calcium: 7.7 mg/dL — ABNORMAL LOW (ref 8.9–10.3)
Chloride: 95 mmol/L — ABNORMAL LOW (ref 98–111)
Creatinine, Ser: 1.16 mg/dL (ref 0.61–1.24)
GFR calc Af Amer: >60 mL/min (ref >60)
GFR calc non Af Amer: 58 mL/min — ABNORMAL LOW (ref >60)
Glucose, Bld: 129 mg/dL — ABNORMAL HIGH (ref 70–99)
Potassium: 3.9 mmol/L (ref 3.5–5.1)
Sodium: 130 mmol/L — ABNORMAL LOW (ref 135–145)

## 2020-02-24 LAB — CBC
HCT: 24.1 % — ABNORMAL LOW (ref 39.0–52.0)
Hemoglobin: 7.8 g/dL — ABNORMAL LOW (ref 13.0–17.0)
MCH: 30.6 pg (ref 26.0–34.0)
MCHC: 32.4 g/dL (ref 30.0–36.0)
MCV: 94.5 fL (ref 80.0–100.0)
Platelets: 44 10*3/uL — ABNORMAL LOW (ref 150–400)
RBC: 2.55 MIL/uL — ABNORMAL LOW (ref 4.22–5.81)
RDW: 18.8 % — ABNORMAL HIGH (ref 11.5–15.5)
WBC: 1.8 10*3/uL — ABNORMAL LOW (ref 4.0–10.5)
nRBC: 0 % (ref 0.0–0.2)

## 2020-02-24 LAB — MAGNESIUM: Magnesium: 1.7 mg/dL (ref 1.7–2.4)

## 2020-02-24 LAB — PHOSPHORUS: Phosphorus: 2.8 mg/dL (ref 2.5–4.6)

## 2020-02-24 MED ORDER — DOCUSATE SODIUM 100 MG PO CAPS
200.0000 mg | ORAL_CAPSULE | Freq: Two times a day (BID) | ORAL | Status: DC
  Administered 2020-02-24 – 2020-02-25 (×3): 200 mg via ORAL
  Filled 2020-02-24 (×3): qty 2

## 2020-02-24 NOTE — Progress Notes (Signed)
PROGRESS NOTE    Adam Moss  TDV:[id number] DOB: Aug 09, 1933 DOA: 02/20/2020 PCP: Perrin Maltese, MD    Assessment & Plan:   Principal Problem:   Compression fracture of body of thoracic vertebra [facility]) Active Problems:   Parkinson disease ([place])   Lower extremity edema   Mixed hyperlipidemia   Chronic obstructive pulmonary disease ([place])   Multiple myeloma ([place])   Essential hypertension   BPH (benign prostatic hyperplasia)   Venous insufficiency   Hyponatremia   Hypokalemia   AKI (acute kidney injury) ([place])   Pancytopenia ([facility])   Constipation   Compression fracture of body of thoracic vertebra: present on admission s/p mechanical fall in the setting of multiple myeloma with lytic skeletal lesions. Continue on lidocaine patch. IV morphine & oxycodone prn for pain. PT/OT recs SNF  Hyponatremia: etiology unclear, possibly hypervolemic. Labile. Will continue to monitor   Hypokalemia: w/in normal limits. Will continue to montior   Hypophosphatemia: w/in normal limits. Will continue to monitor   Hypomagnesemia: WNL today. Will continue to monitor   AKI: etiology unclear, likely pre-renal. Continue on IVFs. Cr continues to trend down.   Chronic constipation: secondary to Parkinson's. Continue on dulcolax, enema prn. Has had bowel movements while inpatient   LE edema: likely secondary to venous insufficiency. Continue to hold lasix & spironolactone secondary to AKI.   Multiple myeloma: currently on lenalidomide (21 days on, 7 off).  This cycle started on 01/31/20, will hold lenalidomide for now defer to oncology.  Dr. Grayland Ormond is the pt's oncologist   HTN: continue to hold home lasix and spironolactone secondary to AKI.  Hydralazine prn   Pancytopenia: present on admission. Likely secondary to chem & MM.  Continue to hold aspirin. No transfusion needed currently   Parkinson disease: continue on home dose of carbidopa levodopa. Likely contributing to pt's intermittent  confusion/delirium  Hyperlipidemia: continue on statin   COPD: w/o exacerbation. Continue on bronchodilators   BPH: continue on home dose of tamulosin and finasteride    DVT prophylaxis: SCDs Code Status: DNR Family Communication: Disposition Plan:  D/c to SNF  Status is: Inpatient  Remains inpatient appropriate because:Unsafe d/c plan, will likely go to SNF in 1 day    Dispo: The patient is from: Home              Anticipated d/c is to: SNF              Anticipated d/c date is: 1 day              Patient currently is medically stable to d/c.    Consultants:      Procedures:    Antimicrobials:    Subjective: Pt c/o not having a bowel movement today  Objective: Vitals:   02/23/20 0750 02/23/20 1511 02/23/20 1513 02/24/20 0033  BP: 92/66 (!) 90/60 102/67 105/71  Pulse: 72 84 81 85  Resp:  16  17  Temp:  98.5 F (36.9 C)  (!) 97.5 F (36.4 C)  TempSrc:  Oral  Oral  SpO2: 97% 97% 98% 97%  Weight:      Height:        Intake/Output Summary (Last 24 hours) at 02/24/2020 0759 Last data filed at 02/24/2020 0500 Gross per 24 hour  Intake 1768.67 ml  Output 2370 ml  Net -601.33 ml   Filed Weights   02/20/20 0850  Weight: 74.8 kg    Examination:  General exam: Appears calm but uncomfortable  Respiratory system: Clear breath  sounds b/l. No wheezes Cardiovascular system: S1 & S2 +. No rubs, gallops or clicks. B/l LE edema  Gastrointestinal system: Abdomen is nondistended, soft and nontender. Hypoactive bowel sounds heard. Central nervous system: Alert and oriented. Moves all 4 extremities Psychiatry: Judgement and insight appear normal. Flat mood and affect     Data Reviewed: I have personally reviewed following labs and imaging studies  CBC: Recent Labs  Lab 02/20/20 1316 02/21/20 0523 02/22/20 0416 02/23/20 0418 02/24/20 0536  WBC 2.9* 2.2* 1.7* 1.9* 1.8*  HGB 9.7* 8.1* 8.2* 8.0* 7.8*  HCT 28.7* 25.2* 25.7* 23.8* 24.1*  MCV 91.7 93.0 95.5  92.2 94.5  PLT 67* 55* 44* 39* 44*   Basic Metabolic Panel: Recent Labs  Lab 02/20/20 1316 02/20/20 1316 02/20/20 2209 02/21/20 0523 02/22/20 0416 02/23/20 0418 02/24/20 0536  NA 127*   < > 130* 131* 133* 129* 130*  K <2.0*   < > 2.1* 2.4* 3.2* 3.9 3.9  CL 77*   < > 80* 84* 91* 92* 95*  CO2 32   < > 37* 38* 32 28 26  GLUCOSE 111*   < > 123* 109* 123* 121* 129*  BUN 40*   < > 35* 34* 24* 20 18  CREATININE 1.96*   < > 1.69* 1.58* 1.40* 1.21 1.16  CALCIUM 9.8   < > 8.6* 8.2* 8.3* 8.0* 7.7*  MG 2.0  --   --  1.8 1.7 1.4* 1.7  PHOS  --   --   --   --  2.1* 3.2 2.8   < > = values in this interval not displayed.   GFR: Estimated Creatinine Clearance: 48.9 mL/min (by C-G formula based on SCr of 1.16 mg/dL). Liver Function Tests: No results for input(s): AST, ALT, ALKPHOS, BILITOT, PROT, ALBUMIN in the last 168 hours. No results for input(s): LIPASE, AMYLASE in the last 168 hours. No results for input(s): AMMONIA in the last 168 hours. Coagulation Profile: No results for input(s): INR, PROTIME in the last 168 hours. Cardiac Enzymes: No results for input(s): CKTOTAL, CKMB, CKMBINDEX, TROPONINI in the last 168 hours. BNP (last 3 results) No results for input(s): PROBNP in the last 8760 hours. HbA1C: No results for input(s): HGBA1C in the last 72 hours. CBG: Recent Labs  Lab 02/21/20 0830 02/23/20 1933  GLUCAP 113* 150*   Lipid Profile: No results for input(s): CHOL, HDL, LDLCALC, TRIG, CHOLHDL, LDLDIRECT in the last 72 hours. Thyroid Function Tests: No results for input(s): TSH, T4TOTAL, FREET4, T3FREE, THYROIDAB in the last 72 hours. Anemia Panel: No results for input(s): VITAMINB12, FOLATE, FERRITIN, TIBC, IRON, RETICCTPCT in the last 72 hours. Sepsis Labs: No results for input(s): PROCALCITON, LATICACIDVEN in the last 168 hours.  Recent Results (from the past 240 hour(s))  SARS Coronavirus 2 by RT PCR (hospital order, performed in [facility] hospital lab)  Nasopharyngeal     Status: None   Collection Time: 02/20/20  1:16 PM   Specimen: Nasopharyngeal  Result Value Ref Range Status   SARS Coronavirus 2 NEGATIVE NEGATIVE Final    Comment: (NOTE) SARS-CoV-2 target nucleic acids are NOT DETECTED.  The SARS-CoV-2 RNA is generally detectable in upper and lower respiratory specimens during the acute phase of infection. The lowest concentration of SARS-CoV-2 viral copies this assay can detect is 250 copies / mL. A negative result does not preclude SARS-CoV-2 infection and should not be used as the sole basis for treatment or other patient management decisions.  A negative result may occur  with improper specimen collection / handling, submission of specimen other than nasopharyngeal swab, presence of viral mutation(s) within the areas targeted by this assay, and inadequate number of viral copies (<250 copies / mL). A negative result must be combined with clinical observations, patient history, and epidemiological information.  Fact Sheet for Patients:   StrictlyIdeas.no  Fact Sheet for Healthcare Providers: BankingDealers.co.za  This test is not yet approved or  cleared by the [place] FDA and has been authorized for detection and/or diagnosis of SARS-CoV-2 by FDA under an Emergency Use Authorization (EUA).  This EUA will remain in effect (meaning this test can be used) for the duration of the COVID-19 declaration under Section 564(b)(1) of the Act, 21 U.S.C. section 360bbb-3(b)(1), unless the authorization is terminated or revoked sooner.  Performed at [facility], [address], [place], [place] [address]          Radiology Studies: No results found.      Scheduled Meds: . carbidopa-levodopa  1 tablet Oral 5 X Daily  . feeding supplement (ENSURE ENLIVE)  237 mL Oral TID  . finasteride  5 mg Oral Daily  . lidocaine  2 patch Transdermal Q24H  . loratadine  10  mg Oral Daily  . niacin  500 mg Oral BH-q7a  . pantoprazole  40 mg Oral Daily  . rosuvastatin  20 mg Oral QHS  . senna  1 tablet Oral BID  . sertraline  25 mg Oral QHS  . tamsulosin  0.4 mg Oral Daily  . umeclidinium-vilanterol  1 puff Inhalation Daily  . vitamin B-12  1,000 mcg Oral Daily   Continuous Infusions: . lactated ringers with kcl 75 mL/hr at 02/22/20 2012     LOS: 4 days    Time spent: 32 mins    Wyvonnia Dusky, MD [facility] Pager [phone]  If 7PM-7AM, please contact night-coverage [email or web address] 02/24/2020, 7:59 AM

## 2020-02-25 DIAGNOSIS — Z87440 Personal history of urinary (tract) infections: Secondary | ICD-10-CM | POA: Diagnosis not present

## 2020-02-25 DIAGNOSIS — C9000 Multiple myeloma not having achieved remission: Secondary | ICD-10-CM

## 2020-02-25 DIAGNOSIS — D61818 Other pancytopenia: Secondary | ICD-10-CM | POA: Diagnosis not present

## 2020-02-25 DIAGNOSIS — R279 Unspecified lack of coordination: Secondary | ICD-10-CM | POA: Diagnosis not present

## 2020-02-25 DIAGNOSIS — Z7401 Bed confinement status: Secondary | ICD-10-CM | POA: Diagnosis not present

## 2020-02-25 DIAGNOSIS — R600 Localized edema: Secondary | ICD-10-CM | POA: Diagnosis not present

## 2020-02-25 DIAGNOSIS — G20 Parkinson's disease: Secondary | ICD-10-CM | POA: Diagnosis not present

## 2020-02-25 DIAGNOSIS — Z515 Encounter for palliative care: Secondary | ICD-10-CM | POA: Diagnosis not present

## 2020-02-25 DIAGNOSIS — J302 Other seasonal allergic rhinitis: Secondary | ICD-10-CM | POA: Diagnosis not present

## 2020-02-25 DIAGNOSIS — M8000XD Age-related osteoporosis with current pathological fracture, unspecified site, subsequent encounter for fracture with routine healing: Secondary | ICD-10-CM | POA: Diagnosis not present

## 2020-02-25 DIAGNOSIS — J449 Chronic obstructive pulmonary disease, unspecified: Secondary | ICD-10-CM | POA: Diagnosis not present

## 2020-02-25 DIAGNOSIS — I872 Venous insufficiency (chronic) (peripheral): Secondary | ICD-10-CM | POA: Diagnosis not present

## 2020-02-25 DIAGNOSIS — R5381 Other malaise: Secondary | ICD-10-CM | POA: Diagnosis not present

## 2020-02-25 DIAGNOSIS — M2550 Pain in unspecified joint: Secondary | ICD-10-CM | POA: Diagnosis not present

## 2020-02-25 DIAGNOSIS — E785 Hyperlipidemia, unspecified: Secondary | ICD-10-CM | POA: Diagnosis not present

## 2020-02-25 DIAGNOSIS — I129 Hypertensive chronic kidney disease with stage 1 through stage 4 chronic kidney disease, or unspecified chronic kidney disease: Secondary | ICD-10-CM | POA: Diagnosis not present

## 2020-02-25 DIAGNOSIS — K5900 Constipation, unspecified: Secondary | ICD-10-CM | POA: Diagnosis not present

## 2020-02-25 DIAGNOSIS — R2689 Other abnormalities of gait and mobility: Secondary | ICD-10-CM | POA: Diagnosis not present

## 2020-02-25 DIAGNOSIS — S22020D Wedge compression fracture of second thoracic vertebra, subsequent encounter for fracture with routine healing: Secondary | ICD-10-CM | POA: Diagnosis not present

## 2020-02-25 DIAGNOSIS — N400 Enlarged prostate without lower urinary tract symptoms: Secondary | ICD-10-CM | POA: Diagnosis not present

## 2020-02-25 DIAGNOSIS — Z20828 Contact with and (suspected) exposure to other viral communicable diseases: Secondary | ICD-10-CM | POA: Diagnosis not present

## 2020-02-25 DIAGNOSIS — K2100 Gastro-esophageal reflux disease with esophagitis, without bleeding: Secondary | ICD-10-CM | POA: Diagnosis not present

## 2020-02-25 DIAGNOSIS — M6281 Muscle weakness (generalized): Secondary | ICD-10-CM | POA: Diagnosis not present

## 2020-02-25 DIAGNOSIS — Z85828 Personal history of other malignant neoplasm of skin: Secondary | ICD-10-CM | POA: Diagnosis not present

## 2020-02-25 DIAGNOSIS — Z9181 History of falling: Secondary | ICD-10-CM | POA: Diagnosis not present

## 2020-02-25 DIAGNOSIS — J410 Simple chronic bronchitis: Secondary | ICD-10-CM | POA: Diagnosis not present

## 2020-02-25 DIAGNOSIS — S22000A Wedge compression fracture of unspecified thoracic vertebra, initial encounter for closed fracture: Secondary | ICD-10-CM | POA: Diagnosis not present

## 2020-02-25 DIAGNOSIS — S22080D Wedge compression fracture of T11-T12 vertebra, subsequent encounter for fracture with routine healing: Secondary | ICD-10-CM | POA: Diagnosis not present

## 2020-02-25 LAB — CBC
HCT: 24.1 % — ABNORMAL LOW (ref 39.0–52.0)
Hemoglobin: 7.7 g/dL — ABNORMAL LOW (ref 13.0–17.0)
MCH: 30.3 pg (ref 26.0–34.0)
MCHC: 32 g/dL (ref 30.0–36.0)
MCV: 94.9 fL (ref 80.0–100.0)
Platelets: 51 10*3/uL — ABNORMAL LOW (ref 150–400)
RBC: 2.54 MIL/uL — ABNORMAL LOW (ref 4.22–5.81)
RDW: 18.5 % — ABNORMAL HIGH (ref 11.5–15.5)
WBC: 2 10*3/uL — ABNORMAL LOW (ref 4.0–10.5)
nRBC: 0 % (ref 0.0–0.2)

## 2020-02-25 LAB — BASIC METABOLIC PANEL
Anion gap: 8 (ref 5–15)
BUN: 20 mg/dL (ref 8–23)
CO2: 29 mmol/L (ref 22–32)
Calcium: 7.8 mg/dL — ABNORMAL LOW (ref 8.9–10.3)
Chloride: 93 mmol/L — ABNORMAL LOW (ref 98–111)
Creatinine, Ser: 1.26 mg/dL — ABNORMAL HIGH (ref 0.61–1.24)
GFR calc Af Amer: >60 mL/min (ref >60)
GFR calc non Af Amer: 52 mL/min — ABNORMAL LOW (ref >60)
Glucose, Bld: 122 mg/dL — ABNORMAL HIGH (ref 70–99)
Potassium: 3.8 mmol/L (ref 3.5–5.1)
Sodium: 130 mmol/L — ABNORMAL LOW (ref 135–145)

## 2020-02-25 LAB — PHOSPHORUS: Phosphorus: 3.1 mg/dL (ref 2.5–4.6)

## 2020-02-25 LAB — MAGNESIUM: Magnesium: 1.6 mg/dL — ABNORMAL LOW (ref 1.7–2.4)

## 2020-02-25 MED ORDER — LENALIDOMIDE 20 MG PO CAPS: 20.0000 mg | ORAL_CAPSULE | Freq: Every day | ORAL | 0 refills | Status: DC

## 2020-02-25 MED ORDER — TRAMADOL HCL 50 MG PO TABS: 50.0000 mg | ORAL_TABLET | Freq: Three times a day (TID) | ORAL | 0 refills | Status: AC | PRN

## 2020-02-25 MED ORDER — MAGNESIUM SULFATE 2 GM/50ML IV SOLN
2.0000 g | Freq: Once | INTRAVENOUS | Status: AC
  Administered 2020-02-25: 2 g via INTRAVENOUS
  Filled 2020-02-25: qty 50

## 2020-02-25 NOTE — Progress Notes (Signed)
Report called to Sonia Baller, LPN @ [place]

## 2020-02-25 NOTE — TOC Progression Note (Signed)
Transition of Care [facility]) - Progression Note    Patient Details  Name: Adam Moss MRN: [id number] Date of Birth: Dec 18, 1933  Transition of Care [facility]) CM/SW [place], RN Phone Number: 02/25/2020, 1:14 PM  Clinical Narrative:   [facility] to [facility] up transport to room 354 to [facility] the bedside nurse is aware and is calling report to [facility], the family is aware, there is 1 ahead of him on the list, DC packet on the chart including DNR         Expected Discharge Plan and Services           Expected Discharge Date: 02/25/20                                     Social Determinants of Health (SDOH) Interventions    Readmission Risk Interventions Readmission Risk Prevention Plan 02/22/2020  Transportation Screening Complete  PCP or Specialist Appt within 3-5 Days Complete  HRI or [place] Complete  Palliative Care Screening Not Applicable  Medication Review (RN Care Manager) Complete  Some recent data might be hidden

## 2020-02-25 NOTE — Discharge Summary (Signed)
Physician Discharge Summary  Adam Moss ZYY:[id number] DOB: September 24, 1933 DOA: 02/20/2020  PCP: Perrin Maltese, MD  Admit date: 02/20/2020 Discharge date: 02/25/2020  Admitted From: home Disposition:  SNF  Recommendations for Outpatient Follow-up:  1. Follow up with PCP in 1-2 weeks 2. F/u oncology, Dr. Grayland Ormond, w/in 1 week   Home Health: no Equipment/Devices:  Discharge Condition: stable CODE STATUS: DNR  Diet recommendation: Heart Healthy   Brief/Interim Summary: HPI was taken from Dr. Arbutus Ped: Adam Moss is a 86 y.o. male with medical history of multiple myeloma, Parkinson's disease, hypertension, hyperlipidemia, COPD, chronic lower extremity edema with venous insufficiency who presented from home today after mechanical fall.  Patient lives independently and has home health aide available.  This morning, he states he was attempting to get up to empty his urinal when he fell backwards.  He denies any preceding symptoms such as dizziness, lightheadedness, palpitations or chest pain.  He denies any head or neck injury or loss of consciousness.  He currently denies any pain, says his back only hurts when he was standing or trying to move around.  Does not have pain when he is at rest.  He otherwise reports being in his usual state of health recently without any acute illnesses.  He denies specifically any recent fevers or chills, dysuria, nausea vomiting or diarrhea, abdominal pain, cough, sore throat or congestion.  Denies seeing any blood in his urine or stool.  Denies any unilateral weakness numbness or tingling.  Of note, patient reports that he thinks it is time for him to go to assisted living or skilled nursing due to the amount of assistance he requires.  ED Course: Vitals were normal.  CBC showed pancytopenia with WBC 2.9, Hbg 9.7, platelets 67 (all cell lines down slightly from labs on 6/29).  BMP notable for hyponatremia 127, hypokalemia <2.0, hypochloremia 77, BUN 40,  creatinine 1.96 (compared to 0.79 in February).  UA was negative.  CT of head and C-spine were negative for acute findings.  CT of thoracic spine showed acute compression fractures of T2 and T12, in addition to a chronic first right rib fracture.  Lumbar CT showed an L2 compression fracture, nonacute but new since September.  Chest x-ray negative for acute cardiopulmonary findings but did show old fractures.  Patient received bolus of normal saline and started on potassium replacement in the ED.  Admitted to hospitalist service for further evaluation and management.  Hospital Course from Dr. Lenise Herald 7/29-02/25/20: Pt was found to have a compression fracture of body of T spine that was likely secondary fall in setting of multiple myeloma with lytic skeletal lesion. Pt is not surgical candidate. Pt's pain was controlled w/ IV morphine, oxycodone while inpatient. Also, PT/OT saw the pt and recommended SNF. Of note, pt was have some electrolyte abnormalities that were repleted.    Discharge Diagnoses:  Principal Problem:   Compression fracture of body of thoracic vertebra (HCC) Active Problems:   Parkinson disease ([place])   Lower extremity edema   Mixed hyperlipidemia   Chronic obstructive pulmonary disease (HCC)   Multiple myeloma (HCC)   Essential hypertension   BPH (benign prostatic hyperplasia)   Venous insufficiency   Hyponatremia   Hypokalemia   AKI (acute kidney injury) ([place])   Pancytopenia (HCC)   Constipation  Compression fracture of body of thoracic vertebra: present on admission s/p mechanical fall in the setting of multiple myeloma with lytic skeletal lesions. Continue on lidocaine patch. IV morphine & oxycodone prn for  pain. PT/OT recs SNF  Hyponatremia: etiology unclear, possibly hypervolemic. Labile. Will continue to monitor   Hypokalemia: WNL today. Will continue to montior   Hypophosphatemia: WNL today. Will continue to monitor   Hypomagnesemia: magnesium sulfate  given. Will continue to monitor   AKI: etiology unclear, likely pre-renal. Cr is labile  Chronic constipation: secondary to Parkinson's. Continue on dulcolax, enema prn. Has had bowel movements while inpatient   LE edema: likely secondary to venous insufficiency. Continue to hold lasix & spironolactone secondary to AKI while inpatient and restart at d/c   Multiple myeloma: currently on lenalidomide(21 dayson, 7 off). This cycle started on 01/31/20, will hold lenalidomide for now defer to oncology.Dr. Grayland Ormond is the pt's oncologist   EAV:[id number] to hold home lasix and spironolactone secondary to AKI & restart on d/c   Pancytopenia: present on admission. Likely secondary to chem & MM.Continue to hold aspirin. No transfusion needed currently   Parkinson disease: continue on home dose of carbidopa levodopa. Likely contributing to pt's intermittent confusion/delirium  Hyperlipidemia: continue on statin   COPD: w/o exacerbation. Continue on bronchodilators   BPH: continue on home dose oftamulosin and finasteride     Discharge Instructions  Discharge Instructions    Diet - low sodium heart healthy   Complete by: As directed    Discharge instructions   Complete by: As directed    F/U PCP in 1-2 weeks. F/u oncology within 1 week.   Increase activity slowly   Complete by: As directed      Allergies as of 02/25/2020      Reactions   Carbidopa-levodopa Other (See Comments)   Severe stomach pains, can take rytary   Oxycodone-acetaminophen Other (See Comments)   "boils on skin"   Tyloxapol    Acetaminophen Rash, Nausea And Vomiting, Hives   Pramipexole Nausea Only      Medication List    STOP taking these medications   meloxicam 15 MG tablet Commonly known as: MOBIC     TAKE these medications   aspirin 81 MG tablet Take 81 mg by mouth daily.   Calcium Citrate 250 MG Tabs Take 1 tablet by mouth daily.   cetirizine 10 MG tablet Commonly known as:  ZYRTEC Take 10 mg by mouth daily.   finasteride 5 MG tablet Commonly known as: PROSCAR Take 1 tablet (5 mg total) by mouth daily.   fluticasone 50 MCG/ACT nasal spray Commonly known as: FLONASE Place 1 spray into both nostrils daily as needed for allergies.   furosemide 20 MG tablet Commonly known as: LASIX Take 40 mg by mouth 2 (two) times daily.   ipratropium 0.03 % nasal spray Commonly known as: ATROVENT Place 0.03 mLs into both nostrils 3 (three) times daily as needed.   Klor-Con M20 20 MEQ tablet Generic drug: potassium chloride SA TAKE 1 TABLET BY MOUTH TWICE A DAY What changed: how much to take   lenalidomide 20 MG capsule Commonly known as: REVLIMID Take 1 capsule (20 mg total) by mouth daily. Take for 21 days then hold for 7 days. Repeat every 28 days. HOLD THIS MEDICATION UNTIL ONCOLOGIST TELLS YOU TO RESTART IT What changed: additional instructions   niacin 500 MG tablet Take 500 mg by mouth every morning.   omeprazole 40 MG capsule Commonly known as: PRILOSEC Take 40 mg by mouth daily.   Oyster Shell Calcium/D 250-125 MG-UNIT Tabs Take 1 tablet by mouth daily.   prochlorperazine 10 MG tablet Commonly known as: COMPAZINE Take 1 tablet (10 mg  total) by mouth every 6 (six) hours as needed for nausea or vomiting.   rosuvastatin 20 MG tablet Commonly known as: CRESTOR Take 20 mg by mouth at bedtime.   Rytary 48.75-195 MG Cpcr Generic drug: Carbidopa-Levodopa ER Take 195 mg by mouth 5 (five) times daily.   sertraline 25 MG tablet Commonly known as: ZOLOFT Take 25 mg by mouth at bedtime.   spironolactone 25 MG tablet Commonly known as: ALDACTONE Take 25 mg by mouth daily.   tamsulosin 0.4 MG Caps capsule Commonly known as: FLOMAX Take 1 capsule (0.4 mg total) by mouth daily.   traMADol 50 MG tablet Commonly known as: ULTRAM Take 1 tablet (50 mg total) by mouth every 8 (eight) hours as needed for up to 5 days (mild pain).   Trelegy Ellipta  100-62.5-25 MCG/INH Aepb Generic drug: Fluticasone-Umeclidin-Vilant Inhale 1 spray into the lungs daily.   vitamin B-12 1000 MCG tablet Commonly known as: CYANOCOBALAMIN Take 1,000 mcg by mouth daily.       Contact information for follow-up providers    Lloyd Huger, MD Follow up in 1 week(s).   Specialty: Oncology Contact information: [address] [place] [address] [phone]            Contact information for after-discharge care    Destination    [facility] Preferred [facility] .   Service: Skilled Nursing Contact information: [address] [place] [phone]                 Allergies  Allergen Reactions  . Carbidopa-Levodopa Other (See Comments)    Severe stomach pains, can take rytary  . Oxycodone-Acetaminophen Other (See Comments)    "boils on skin"  . Tyloxapol   . Acetaminophen Rash, Nausea And Vomiting and Hives  . Pramipexole Nausea Only    Consultations:  Dr. Lavena Bullion   Procedures/Studies: DG Chest 2 View  Result Date: 02/20/2020 CLINICAL DATA:  [place] today with back pain EXAM: CHEST - 2 VIEW COMPARISON:  09/10/2019 FINDINGS: Heart size upper limits of normal. Tortuous aorta as seen previously. The lungs are clear. No infiltrate, collapse, edema or effusion. Old right clavicle fracture. Compression fracture of T12 is visible as shown at lumbar radiography. IMPRESSION: No active cardiopulmonary disease. T12 compression fracture visible, better shown at lumbar radiography. Old right clavicle fracture. Electronically Signed   By: Nelson Chimes M.D.   On: 02/20/2020 13:59   DG Lumbar Spine Complete  Result Date: 02/20/2020 CLINICAL DATA:  Pain following fall EXAM: LUMBAR SPINE - COMPLETE 4+ VIEW COMPARISON:  CT abdomen and pelvis including bony reformats April 17, 2019 FINDINGS: Frontal, lateral, spot lumbosacral lateral, and bilateral oblique views were obtained. There are 5  non-rib-bearing lumbar type vertebral bodies. There is lumbar dextroscoliosis. There is moderate wedging of the T12 vertebral body which was not present previously and may well be acute. There is more subtle anterior wedging of the L2 vertebral body. No other fractures are appreciable. There is 2 mm of anterolisthesis of L3 on L4, stable. No new spondylolisthesis. There is moderately severe disc space narrowing at L3-4, L4-5, and L5-S1, also present previously. There is facet osteoarthritic change at L3-4, L4-5, and L5-S1 bilaterally. IMPRESSION: Scoliosis. Probable acute anterior wedge fractures at T12 and L2, more severe at T12. Stable slight anterolisthesis of L3 on L4. No new spondylolisthesis. Multilevel arthropathy, most severe at L3-4, L4-5, L5-S1, also noted on previous CT examination. Electronically Signed   By: Lowella Grip III  M.D.   On: 02/20/2020 12:46   DG Pelvis 1-2 Views  Result Date: 02/20/2020 CLINICAL DATA:  Pain following fall EXAM: PELVIS - 1-2 VIEW COMPARISON:  None. FINDINGS: There is no evidence of pelvic fracture or dislocation. There is moderate symmetric narrowing of each hip joint. No erosive change. IMPRESSION: Symmetric narrowing each hip joint.  No fracture or dislocation. Electronically Signed   By: Lowella Grip III M.D.   On: 02/20/2020 12:47   CT Head Wo Contrast  Result Date: 02/20/2020 CLINICAL DATA:  Head trauma history of Parkinson's EXAM: CT HEAD WITHOUT CONTRAST CT CERVICAL SPINE WITHOUT CONTRAST TECHNIQUE: Multidetector CT imaging of the head and cervical spine was performed following the standard protocol without intravenous contrast. Multiplanar CT image reconstructions of the cervical spine were also generated. COMPARISON:  None FINDINGS: CT HEAD FINDINGS Brain: No evidence of acute infarction, hemorrhage, hydrocephalus, extra-axial collection or mass lesion/mass effect. Signs of atrophy and chronic microvascular ischemic changes in the deep white matter.  Vascular: No hyperdense vessel or unexpected calcification. Skull: Normal. Negative for fracture or focal lesion. Sinuses/Orbits: Visualized paranasal sinuses and orbits are unremarkable. Other: None. CT CERVICAL SPINE FINDINGS Alignment: Mild reversal of normal cervical lordosis in the setting of multilevel degenerative change. Degenerative changes greatest in the mid to upper cervical spine. Skull base and vertebrae: No signs of fracture of the cervical spine or skull base, cranial cervical junction. Signs of pathologic fracture, of the T2 vertebral body. Cortical margins suggest acute process. No retropulsion of fracture elements with fracture extending through the posterior vertebral body, associated with lucent lesion, likely metastatic disease in this location. Lucent area seen in this location on previous imaging evaluation. Soft tissues and spinal canal: No prevertebral fluid or swelling. No visible canal hematoma. Disc levels: Multilevel degenerative changes greatest at with disc space loss, near complete at C3-4, C4-5, C5-6 and C6-7. Facet arthropathy noted throughout the cervical spine greatest at C3-4 on the RIGHT Upper chest: Lung apices are clear. Subacute to chronic RIGHT-sided first rib fracture. Other: None IMPRESSION: 1. No acute intracranial abnormality. 2. Signs of atrophy and chronic microvascular ischemic changes. 3. Pathologic fracture of T2 likely acute, through presumed metastatic focus in the T2 vertebral body. No additional fractures noted in the visualized portion of the thoracic spine or within the cervical spine. 4. Multilevel degenerative changes of the cervical spine. 5. Subacute to chronic RIGHT-sided first rib fracture. Electronically Signed   By: Zetta Bills M.D.   On: 02/20/2020 11:56   CT Cervical Spine Wo Contrast  Result Date: 02/20/2020 CLINICAL DATA:  Head trauma history of Parkinson's EXAM: CT HEAD WITHOUT CONTRAST CT CERVICAL SPINE WITHOUT CONTRAST TECHNIQUE:  Multidetector CT imaging of the head and cervical spine was performed following the standard protocol without intravenous contrast. Multiplanar CT image reconstructions of the cervical spine were also generated. COMPARISON:  None FINDINGS: CT HEAD FINDINGS Brain: No evidence of acute infarction, hemorrhage, hydrocephalus, extra-axial collection or mass lesion/mass effect. Signs of atrophy and chronic microvascular ischemic changes in the deep white matter. Vascular: No hyperdense vessel or unexpected calcification. Skull: Normal. Negative for fracture or focal lesion. Sinuses/Orbits: Visualized paranasal sinuses and orbits are unremarkable. Other: None. CT CERVICAL SPINE FINDINGS Alignment: Mild reversal of normal cervical lordosis in the setting of multilevel degenerative change. Degenerative changes greatest in the mid to upper cervical spine. Skull base and vertebrae: No signs of fracture of the cervical spine or skull base, cranial cervical junction. Signs of pathologic fracture, of the T2  vertebral body. Cortical margins suggest acute process. No retropulsion of fracture elements with fracture extending through the posterior vertebral body, associated with lucent lesion, likely metastatic disease in this location. Lucent area seen in this location on previous imaging evaluation. Soft tissues and spinal canal: No prevertebral fluid or swelling. No visible canal hematoma. Disc levels: Multilevel degenerative changes greatest at with disc space loss, near complete at C3-4, C4-5, C5-6 and C6-7. Facet arthropathy noted throughout the cervical spine greatest at C3-4 on the RIGHT Upper chest: Lung apices are clear. Subacute to chronic RIGHT-sided first rib fracture. Other: None IMPRESSION: 1. No acute intracranial abnormality. 2. Signs of atrophy and chronic microvascular ischemic changes. 3. Pathologic fracture of T2 likely acute, through presumed metastatic focus in the T2 vertebral body. No additional fractures  noted in the visualized portion of the thoracic spine or within the cervical spine. 4. Multilevel degenerative changes of the cervical spine. 5. Subacute to chronic RIGHT-sided first rib fracture. Electronically Signed   By: Zetta Bills M.D.   On: 02/20/2020 11:56   CT Thoracic Spine Wo Contrast  Result Date: 02/20/2020 CLINICAL DATA:  Mid to low back pain post fall. EXAM: CT THORACIC AND LUMBAR SPINE WITHOUT CONTRAST TECHNIQUE: Multidetector CT imaging of the thoracic and lumbar spine was performed without contrast. Multiplanar CT image reconstructions were also generated. COMPARISON:  Lumbar spine radiographs today. Cervical spine CT today, chest CT 04/01/2019 and abdominopelvic CT 04/17/2019 FINDINGS: CT THORACIC SPINE FINDINGS Alignment: Mildly progressive upper thoracic kyphosis without focal angulation or significant listhesis. Vertebrae: As seen on today's cervical spine CT, there is a mild fracture of the T2 vertebral body resulting in less than 25% loss of vertebral body height and no osseous retropulsion. This fracture appears pathologic with an underlying lytic lesion as seen on prior chest CT. There is also an acute nearly horizontal fracture through the T12 vertebral body, resulting in 25% loss of vertebral body height and 4 mm of osseous retropulsion. This fracture does not show any definite pathologic features. Nonacute fractures of the right clavicle and right 1st rib anteriorly are noted. Possible lytic lesion and pathologic fracture of the left 7th rib near the costovertebral junction (image 64/2). Additional lytic lesions are noted within the T5 and T8 vertebral bodies, suspicious for metastatic disease or myeloma. Paraspinal and other soft tissues: No significant paraspinal hematoma. Mild aortic and branch vessel atherosclerosis. Emphysema and bibasilar atelectasis noted. Disc levels: Relatively mild multilevel thoracic spondylosis with disc space narrowing and anterior osteophytes. There  is a small right foraminal disc protrusion at T6-7. No high-grade foraminal narrowing. CT LUMBAR SPINE FINDINGS Segmentation: There are 5 lumbar type vertebral bodies. Alignment: Grade 1 degenerative anterolisthesis at L3-4 and minimal convex right scoliosis. Vertebrae: Lytic lesions are again noted within the L2 vertebral body and upper sacrum, the latter measuring up to 3.1 cm. These have sclerotic margins and may be partially treated. Other lytic lesions are present within the right aspect of the sacrum. These were all present and described on previous abdominal CT. There is a pathologic fracture of the L2 vertebral body with 30% loss of vertebral body height, asymmetric to the left. This fracture does not appear acute. No definite acute fractures in the lumbar spine. Paraspinal and other soft tissues: No enlarging paraspinal soft tissue masses or hematomas. Mild aortic and branch vessel atherosclerosis. Disc levels: L1-2: Progressive loss of disc height with a new extruded left paracentral disc fragment containing vacuum phenomenon extending inferiorly behind the L2 vertebral body.  This exerts mass effect on the thecal sac and may contribute to left-sided nerve root encroachment. Facet and ligamentous hypertrophy contribute to mild left foraminal narrowing. L2-3: Chronic degenerative disc disease with loss of disc height and endplate osteophytes asymmetric to the left. Mild facet and ligamentous hypertrophy. Mild spinal stenosis with mild asymmetric narrowing of the left lateral recess and left foramen. L3-4: Chronic degenerative disc disease with loss of disc height, endplate osteophytes and moderate facet hypertrophy. Resulting grade 1 anterolisthesis contributes to moderate multifactorial spinal stenosis, moderate lateral recess and mild foraminal narrowing bilaterally. L4-5: Probable previous posterior decompression. Chronic loss of disc height with annular disc bulging and endplate osteophytes asymmetric to  the right. Mild right foraminal narrowing. L5-S1: Probable posterior decompression. Mild disc bulging and endplate osteophytes asymmetric to the left. Bilateral facet hypertrophy, worse on the right. Mild left foraminal narrowing. IMPRESSION: 1. Acute fractures of the T2 and T12 vertebral bodies, as described. The T2 fracture appears pathologic. 2. Interval pathologic fracture of the L2 vertebral body compared with CTs from September. This fracture does not appear acute. No definite acute fractures in the lumbar spine. 3. Multiple lytic lesions are again noted throughout the thoracolumbar spine and sacrum consistent with multiple myeloma or metastatic disease. Correlate with previous biopsy results. 4. Progressive degenerative changes at L1-2 with a new extruded disc fragment containing vacuum phenomenon extending inferiorly behind the L2 vertebral body on the left. This may contribute to left-sided nerve root encroachment. 5. Aortic Atherosclerosis (ICD10-I70.0) and Emphysema (ICD10-J43.9). Electronically Signed   By: Richardean Sale M.D.   On: 02/20/2020 14:13   CT Lumbar Spine Wo Contrast  Result Date: 02/20/2020 CLINICAL DATA:  Mid to low back pain post fall. EXAM: CT THORACIC AND LUMBAR SPINE WITHOUT CONTRAST TECHNIQUE: Multidetector CT imaging of the thoracic and lumbar spine was performed without contrast. Multiplanar CT image reconstructions were also generated. COMPARISON:  Lumbar spine radiographs today. Cervical spine CT today, chest CT 04/01/2019 and abdominopelvic CT 04/17/2019 FINDINGS: CT THORACIC SPINE FINDINGS Alignment: Mildly progressive upper thoracic kyphosis without focal angulation or significant listhesis. Vertebrae: As seen on today's cervical spine CT, there is a mild fracture of the T2 vertebral body resulting in less than 25% loss of vertebral body height and no osseous retropulsion. This fracture appears pathologic with an underlying lytic lesion as seen on prior chest CT. There is  also an acute nearly horizontal fracture through the T12 vertebral body, resulting in 25% loss of vertebral body height and 4 mm of osseous retropulsion. This fracture does not show any definite pathologic features. Nonacute fractures of the right clavicle and right 1st rib anteriorly are noted. Possible lytic lesion and pathologic fracture of the left 7th rib near the costovertebral junction (image 64/2). Additional lytic lesions are noted within the T5 and T8 vertebral bodies, suspicious for metastatic disease or myeloma. Paraspinal and other soft tissues: No significant paraspinal hematoma. Mild aortic and branch vessel atherosclerosis. Emphysema and bibasilar atelectasis noted. Disc levels: Relatively mild multilevel thoracic spondylosis with disc space narrowing and anterior osteophytes. There is a small right foraminal disc protrusion at T6-7. No high-grade foraminal narrowing. CT LUMBAR SPINE FINDINGS Segmentation: There are 5 lumbar type vertebral bodies. Alignment: Grade 1 degenerative anterolisthesis at L3-4 and minimal convex right scoliosis. Vertebrae: Lytic lesions are again noted within the L2 vertebral body and upper sacrum, the latter measuring up to 3.1 cm. These have sclerotic margins and may be partially treated. Other lytic lesions are present within the right aspect  of the sacrum. These were all present and described on previous abdominal CT. There is a pathologic fracture of the L2 vertebral body with 30% loss of vertebral body height, asymmetric to the left. This fracture does not appear acute. No definite acute fractures in the lumbar spine. Paraspinal and other soft tissues: No enlarging paraspinal soft tissue masses or hematomas. Mild aortic and branch vessel atherosclerosis. Disc levels: L1-2: Progressive loss of disc height with a new extruded left paracentral disc fragment containing vacuum phenomenon extending inferiorly behind the L2 vertebral body. This exerts mass effect on the  thecal sac and may contribute to left-sided nerve root encroachment. Facet and ligamentous hypertrophy contribute to mild left foraminal narrowing. L2-3: Chronic degenerative disc disease with loss of disc height and endplate osteophytes asymmetric to the left. Mild facet and ligamentous hypertrophy. Mild spinal stenosis with mild asymmetric narrowing of the left lateral recess and left foramen. L3-4: Chronic degenerative disc disease with loss of disc height, endplate osteophytes and moderate facet hypertrophy. Resulting grade 1 anterolisthesis contributes to moderate multifactorial spinal stenosis, moderate lateral recess and mild foraminal narrowing bilaterally. L4-5: Probable previous posterior decompression. Chronic loss of disc height with annular disc bulging and endplate osteophytes asymmetric to the right. Mild right foraminal narrowing. L5-S1: Probable posterior decompression. Mild disc bulging and endplate osteophytes asymmetric to the left. Bilateral facet hypertrophy, worse on the right. Mild left foraminal narrowing. IMPRESSION: 1. Acute fractures of the T2 and T12 vertebral bodies, as described. The T2 fracture appears pathologic. 2. Interval pathologic fracture of the L2 vertebral body compared with CTs from September. This fracture does not appear acute. No definite acute fractures in the lumbar spine. 3. Multiple lytic lesions are again noted throughout the thoracolumbar spine and sacrum consistent with multiple myeloma or metastatic disease. Correlate with previous biopsy results. 4. Progressive degenerative changes at L1-2 with a new extruded disc fragment containing vacuum phenomenon extending inferiorly behind the L2 vertebral body on the left. This may contribute to left-sided nerve root encroachment. 5. Aortic Atherosclerosis (ICD10-I70.0) and Emphysema (ICD10-J43.9). Electronically Signed   By: Richardean Sale M.D.   On: 02/20/2020 14:13   US RENAL  Result Date: 02/20/2020 CLINICAL DATA:   86 year old male with acute renal insufficiency. EXAM: RENAL / URINARY TRACT ULTRASOUND COMPLETE COMPARISON:  None. FINDINGS: Evaluation is limited due to inability of the patient to cooperate with exam. Right Kidney: Renal measurements: 9.8 x 4.7 x 5.5 cm = volume: 132 mL. There is mild parenchyma atrophy. Mild increased echogenicity. There may be minimal hydronephrosis. No shadowing stone. Left Kidney: Renal measurements: 10.0 x 5.1 x 5.2 cm = volume: 140 mL. Mild parenchyma atrophy. Mild increased echogenicity. No hydronephrosis or shadowing stone. Bladder: Appears normal for degree of bladder distention. Bilateral ureteral jets noted. Other: None. IMPRESSION: 1. Mild increased echogenicity in keeping with chronic kidney disease. 2. Possible minimal right hydronephrosis.  No shadowing stone. Electronically Signed   By: Anner Crete M.D.   On: 02/20/2020 17:17      Subjective: Pt c/o fatigue   Discharge Exam: Vitals:   02/25/20 0108 02/25/20 0747  BP: 107/73 112/74  Pulse: 86 79  Resp:  19  Temp: 97.9 F (36.6 C) 97.7 F (36.5 C)  SpO2: 97% 100%   Vitals:   02/24/20 1558 02/24/20 1753 02/25/20 0108 02/25/20 0747  BP: 110/66  107/73 112/74  Pulse: 72  86 79  Resp: _0 Temp: 97.6 F (36.4 C)  97.9 F (36.6 C) 97.7  F (36.5 C)  TempSrc: Oral  Oral Oral  SpO2: 100%  97% 100%  Weight:      Height:        General: Pt is alert, awake, not in acute distress Cardiovascular: S1/S2 +, no rubs, no gallops Respiratory: CTA bilaterally, no wheezing, no rhonchi Abdominal: Soft, NT, ND, bowel sounds + Extremities: no cyanosis    The results of significant diagnostics from this hospitalization (including imaging, microbiology, ancillary and laboratory) are listed below for reference.     Microbiology: Recent Results (from the past 240 hour(s))  SARS Coronavirus 2 by RT PCR (hospital order, performed in [facility] hospital lab) Nasopharyngeal     Status: None   Collection  Time: 02/20/20  1:16 PM   Specimen: Nasopharyngeal  Result Value Ref Range Status   SARS Coronavirus 2 NEGATIVE NEGATIVE Final    Comment: (NOTE) SARS-CoV-2 target nucleic acids are NOT DETECTED.  The SARS-CoV-2 RNA is generally detectable in upper and lower respiratory specimens during the acute phase of infection. The lowest concentration of SARS-CoV-2 viral copies this assay can detect is 250 copies / mL. A negative result does not preclude SARS-CoV-2 infection and should not be used as the sole basis for treatment or other patient management decisions.  A negative result may occur with improper specimen collection / handling, submission of specimen other than nasopharyngeal swab, presence of viral mutation(s) within the areas targeted by this assay, and inadequate number of viral copies (<250 copies / mL). A negative result must be combined with clinical observations, patient history, and epidemiological information.  Fact Sheet for Patients:   StrictlyIdeas.no  Fact Sheet for Healthcare Providers: BankingDealers.co.za  This test is not yet approved or  cleared by the [place] FDA and has been authorized for detection and/or diagnosis of SARS-CoV-2 by FDA under an Emergency Use Authorization (EUA).  This EUA will remain in effect (meaning this test can be used) for the duration of the COVID-19 declaration under Section 564(b)(1) of the Act, 21 U.S.C. section 360bbb-3(b)(1), unless the authorization is terminated or revoked sooner.  Performed at [facility], [address] [place], [place] [address]      Labs: BNP (last 3 results) Recent Labs    04/01/19 1625  BNP 60.4   Basic Metabolic Panel: Recent Labs  Lab 02/21/20 0523 02/22/20 0416 02/23/20 0418 02/24/20 0536 02/25/20 0428  NA 131* 133* 129* 130* 130*  K 2.4* 3.2* 3.9 3.9 3.8  CL 84* 91* 92* 95* 93*  CO2 38* 32 _0 GLUCOSE 109* 123*  121* 129* 122*  BUN 34* 24* _1 CREATININE 1.58* 1.40* 1.21 1.16 1.26*  CALCIUM 8.2* 8.3* 8.0* 7.7* 7.8*  MG 1.8 1.7 1.4* 1.7 1.6*  PHOS  --  2.1* 3.2 2.8 3.1   Liver Function Tests: No results for input(s): AST, ALT, ALKPHOS, BILITOT, PROT, ALBUMIN in the last 168 hours. No results for input(s): LIPASE, AMYLASE in the last 168 hours. No results for input(s): AMMONIA in the last 168 hours. CBC: Recent Labs  Lab 02/21/20 0523 02/22/20 0416 02/23/20 0418 02/24/20 0536 02/25/20 0428  WBC 2.2* 1.7* 1.9* 1.8* 2.0*  HGB 8.1* 8.2* 8.0* 7.8* 7.7*  HCT 25.2* 25.7* 23.8* 24.1* 24.1*  MCV 93.0 95.5 92.2 94.5 94.9  PLT 55* 44* 39* 44* 51*   Cardiac Enzymes: No results for input(s): CKTOTAL, CKMB, CKMBINDEX, TROPONINI in the last 168 hours. BNP: Invalid input(s): POCBNP CBG: Recent Labs  Lab 02/21/20  0830 02/23/20 1933  GLUCAP 113* 150*   D-Dimer No results for input(s): DDIMER in the last 72 hours. Hgb A1c No results for input(s): HGBA1C in the last 72 hours. Lipid Profile No results for input(s): CHOL, HDL, LDLCALC, TRIG, CHOLHDL, LDLDIRECT in the last 72 hours. Thyroid function studies No results for input(s): TSH, T4TOTAL, T3FREE, THYROIDAB in the last 72 hours.  Invalid input(s): FREET3 Anemia work up No results for input(s): VITAMINB12, FOLATE, FERRITIN, TIBC, IRON, RETICCTPCT in the last 72 hours. Urinalysis    Component Value Date/Time   COLORURINE STRAW (A) 02/20/2020 0854   APPEARANCEUR CLEAR (A) 02/20/2020 0854   APPEARANCEUR Hazy (A) 11/14/2019 1502   LABSPEC 1.006 02/20/2020 0854   PHURINE 5.0 02/20/2020 0854   GLUCOSEU NEGATIVE 02/20/2020 0854   HGBUR SMALL (A) 02/20/2020 0854   BILIRUBINUR NEGATIVE 02/20/2020 0854   BILIRUBINUR Negative 11/14/2019 1502   [place] 02/20/2020 0854   PROTEINUR NEGATIVE 02/20/2020 0854   NITRITE NEGATIVE 02/20/2020 0854   LEUKOCYTESUR NEGATIVE 02/20/2020 0854   Sepsis Labs Invalid input(s):  PROCALCITONIN,  WBC,  LACTICIDVEN Microbiology Recent Results (from the past 240 hour(s))  SARS Coronavirus 2 by RT PCR (hospital order, performed in [place] hospital lab) Nasopharyngeal     Status: None   Collection Time: 02/20/20  1:16 PM   Specimen: Nasopharyngeal  Result Value Ref Range Status   SARS Coronavirus 2 NEGATIVE NEGATIVE Final    Comment: (NOTE) SARS-CoV-2 target nucleic acids are NOT DETECTED.  The SARS-CoV-2 RNA is generally detectable in upper and lower respiratory specimens during the acute phase of infection. The lowest concentration of SARS-CoV-2 viral copies this assay can detect is 250 copies / mL. A negative result does not preclude SARS-CoV-2 infection and should not be used as the sole basis for treatment or other patient management decisions.  A negative result may occur with improper specimen collection / handling, submission of specimen other than nasopharyngeal swab, presence of viral mutation(s) within the areas targeted by this assay, and inadequate number of viral copies (<250 copies / mL). A negative result must be combined with clinical observations, patient history, and epidemiological information.  Fact Sheet for Patients:   StrictlyIdeas.no  Fact Sheet for Healthcare Providers: BankingDealers.co.za  This test is not yet approved or  cleared by the [place] FDA and has been authorized for detection and/or diagnosis of SARS-CoV-2 by FDA under an Emergency Use Authorization (EUA).  This EUA will remain in effect (meaning this test can be used) for the duration of the COVID-19 declaration under Section 564(b)(1) of the Act, 21 U.S.C. section 360bbb-3(b)(1), unless the authorization is terminated or revoked sooner.  Performed at [facility], [address] [place], [place] [address]      Time coordinating discharge: Over 30 minutes  SIGNED:   Wyvonnia Dusky,  MD  [facility] 02/25/2020, 12:39 PM Pager   If 7PM-7AM, please contact night-coverage [email or web address]

## 2020-02-25 NOTE — Care Management Important Message (Signed)
Important Message  Patient Details  Name: Adam Moss MRN: [id number] Date of Birth: [birth year]   Medicare Important Message Given:  Yes     Su Hilt, RN 02/25/2020, 10:22 AM

## 2020-02-25 NOTE — Progress Notes (Signed)
EMS here to transport pt. 

## 2020-02-25 NOTE — TOC Progression Note (Signed)
Transition of Care [facility]) - Progression Note    Patient Details  Name: Adam Moss MRN: [id number] Date of Birth: June 23, 1934  Transition of Care [facility]) CM/SW [place], RN Phone Number: 02/25/2020, 12:08 PM  Clinical Narrative:   DC packet is on the chart for the patient to DC to [facility] today, Spoke with [name] the daughter in law and notified her of the DC, She stated that they are out of town. But they are fine with the DC plan         Expected Discharge Plan and Services           Expected Discharge Date: 02/25/20                                     Social Determinants of Health (SDOH) Interventions    Readmission Risk Interventions Readmission Risk Prevention Plan 02/22/2020  Transportation Screening Complete  PCP or Specialist Appt within 3-5 Days Complete  HRI or Home Care Consult Complete  Palliative Care Screening Not Applicable  Medication Review (RN Care Manager) Complete  Some recent data might be hidden

## 2020-02-26 DIAGNOSIS — M8000XD Age-related osteoporosis with current pathological fracture, unspecified site, subsequent encounter for fracture with routine healing: Secondary | ICD-10-CM | POA: Insufficient documentation

## 2020-02-26 DIAGNOSIS — C9000 Multiple myeloma not having achieved remission: Secondary | ICD-10-CM | POA: Diagnosis not present

## 2020-02-26 DIAGNOSIS — J410 Simple chronic bronchitis: Secondary | ICD-10-CM | POA: Diagnosis not present

## 2020-02-26 DIAGNOSIS — G20 Parkinson's disease: Secondary | ICD-10-CM | POA: Diagnosis not present

## 2020-02-29 LAB — C DIFFICILE QUICK SCREEN W PCR REFLEX
C Diff antigen: NEGATIVE
C Diff interpretation: NOT DETECTED
C Diff toxin: NEGATIVE

## 2020-02-29 NOTE — Progress Notes (Signed)
[facility]  [phone] [phone]  ID: Mauri Pole OB: [birth year]  MR#: [id number]  QQP#:[id number]  Patient Care Team: Perrin Maltese, MD as PCP - General (Internal Medicine) Lloyd Huger, MD as Consulting Physician (Hematology and Oncology)   CHIEF COMPLAINT: Kappa light chain myeloma.   INTERVAL HISTORY: Patient returns to clinic today for further evaluation and hospital follow-up.  He continues to have chronic weakness and fatigue, but otherwise feels well.  He has not taken Revlimid in approximately 2 to 3 weeks.  He does not complain of pain today.  He continues to have issues with constipation.  He has a resting tremor secondary to his Parkinson's, but no other neurologic complaints.  He denies any recent fevers or illnesses.  He has no chest pain, shortness of breath, cough, or hemoptysis.  He denies any nausea, vomiting, or diarrhea.  He has no urinary complaints.  Patient offers no further specific complaints today.  REVIEW OF SYSTEMS:   Review of Systems  Constitutional: Positive for malaise/fatigue. Negative for fever and weight loss.  Respiratory: Negative.  Negative for cough, hemoptysis and shortness of breath.   Cardiovascular: Negative.  Negative for chest pain and leg swelling.  Gastrointestinal: Positive for constipation. Negative for abdominal pain.  Genitourinary: Negative.  Negative for dysuria.  Musculoskeletal: Negative.  Negative for back pain.  Skin: Negative.  Negative for rash.  Neurological: Positive for tremors and weakness. Negative for dizziness, focal weakness and headaches.  Psychiatric/Behavioral: Negative.  The patient is not nervous/anxious.     As per HPI. Otherwise, a complete review of systems is negative.  PAST MEDICAL HISTORY: Past Medical History:  Diagnosis Date  . Cancer ([place])    skin  . COPD (chronic obstructive pulmonary disease) ([place])   . Hyperlipemia   . Hypertension   .  Lymphedema of both lower extremities   . Parkinson disease ([place])   . Parkinsonism ([place]) 02/21/2015  . Spinal stenosis   . Tremor     PAST SURGICAL HISTORY: Past Surgical History:  Procedure Laterality Date  . APPENDECTOMY    . CATARACT EXTRACTION    . CHOLECYSTECTOMY    . TONSILLECTOMY    . VASECTOMY      FAMILY HISTORY: Family History  Problem Relation Age of Onset  . Cancer Mother   . Heart disease Father     ADVANCED DIRECTIVES (Y/N):  N  HEALTH MAINTENANCE: Social History   Tobacco Use  . Smoking status: Never Smoker  . Smokeless tobacco: Never Used  Vaping Use  . Vaping Use: Never used  Substance Use Topics  . Alcohol use: No    Alcohol/week: 0.0 standard drinks  . Drug use: No     Colonoscopy:  PAP:  Bone density:  Lipid panel:  Allergies  Allergen Reactions  . Carbidopa-Levodopa Other (See Comments)    Severe stomach pains, can take rytary  . Oxycodone-Acetaminophen Other (See Comments)    "boils on skin"  . Tyloxapol   . Acetaminophen Rash, Nausea And Vomiting and Hives  . Pramipexole Nausea Only    Current Outpatient Medications  Medication Sig Dispense Refill  . aspirin 81 MG tablet Take 81 mg by mouth daily.    . Calcium Carbonate-Vitamin D (OYSTER SHELL CALCIUM/D) 250-125 MG-UNIT TABS Take 1 tablet by mouth daily.     . Calcium Citrate 250 MG TABS Take 1 tablet by mouth daily.     . Carbidopa-Levodopa ER (RYTARY) 48.75-195 MG CPCR Take 195 mg by  mouth 5 (five) times daily. 450 capsule 3  . cetirizine (ZYRTEC) 10 MG tablet Take 10 mg by mouth daily.    . finasteride (PROSCAR) 5 MG tablet Take 1 tablet (5 mg total) by mouth daily. 90 tablet 3  . fluticasone (FLONASE) 50 MCG/ACT nasal spray Place 1 spray into both nostrils daily as needed for allergies.     . Fluticasone-Umeclidin-Vilant (TRELEGY ELLIPTA) 100-62.5-25 MCG/INH AEPB Inhale 1 spray into the lungs daily.     . furosemide (LASIX) 20 MG tablet Take 40 mg by mouth 2 (two) times daily.      Marland Kitchen ipratropium (ATROVENT) 0.03 % nasal spray Place 0.03 mLs into both nostrils 3 (three) times daily as needed.  11  . KLOR-CON M20 20 MEQ tablet TAKE 1 TABLET BY MOUTH TWICE A DAY (Patient taking differently: Take 20 mEq by mouth 2 (two) times daily. ) 180 tablet 1  . lactulose (CHRONULAC) 10 GM/15ML solution Take 10 g by mouth every 12 (twelve) hours as needed for mild constipation.    . niacin 500 MG tablet Take 500 mg by mouth every morning.     Marland Kitchen omeprazole (PRILOSEC) 40 MG capsule Take 40 mg by mouth daily.    . rosuvastatin (CRESTOR) 20 MG tablet Take 20 mg by mouth at bedtime.    . sertraline (ZOLOFT) 25 MG tablet Take 25 mg by mouth at bedtime.     Marland Kitchen spironolactone (ALDACTONE) 25 MG tablet Take 25 mg by mouth daily.    . tamsulosin (FLOMAX) 0.4 MG CAPS capsule Take 1 capsule (0.4 mg total) by mouth daily. 90 capsule 3  . vitamin B-12 (CYANOCOBALAMIN) 1000 MCG tablet Take 1,000 mcg by mouth daily.    Marland Kitchen lenalidomide (REVLIMID) 20 MG capsule Take 1 capsule (20 mg total) by mouth daily. Take for 21 days then hold for 7 days. Repeat every 28 days. HOLD THIS MEDICATION UNTIL ONCOLOGIST TELLS YOU TO RESTART IT (Patient not taking: Reported on 03/07/2020) 21 capsule 0  . prochlorperazine (COMPAZINE) 10 MG tablet Take 1 tablet (10 mg total) by mouth every 6 (six) hours as needed for nausea or vomiting. (Patient not taking: Reported on 02/20/2020) 30 tablet 0   No current facility-administered medications for this visit.   Facility-Administered Medications Ordered in Other Visits  Medication Dose Route Frequency Provider Last Rate Last Admin  . 0.9 %  sodium chloride infusion   Intravenous Once Lloyd Huger, MD      . Zoledronic Acid (ZOMETA) IVPB 4 mg  4 mg Intravenous Once Lloyd Huger, MD        OBJECTIVE: Vitals:   03/07/20 1001  BP: (!) 151/96  Pulse: 95  Resp: 18  Temp: 97.8 F (36.6 C)  SpO2: 100%     Body mass index is 24.44 kg/m.    ECOG FS:2 - Symptomatic, <50%  confined to bed  General: Well-developed, well-nourished, no acute distress. Eyes: Pink conjunctiva, anicteric sclera. HEENT: Normocephalic, moist mucous membranes. Lungs: No audible wheezing or coughing. Heart: Regular rate and rhythm. Abdomen: Soft, nontender, no obvious distention. Musculoskeletal: No edema, cyanosis, or clubbing. Neuro: Alert, answering all questions appropriately. Cranial nerves grossly intact. Skin: No rashes or petechiae noted. Psych: Normal affect.  LAB RESULTS:  Lab Results  Component Value Date   NA 131 (L) 03/07/2020   K 3.3 (L) 03/07/2020   CL 89 (L) 03/07/2020   CO2 30 03/07/2020   GLUCOSE 136 (H) 03/07/2020   BUN 23 03/07/2020   CREATININE 1.18  03/07/2020   CALCIUM 8.7 (L) 03/07/2020   PROT 6.3 (L) 03/07/2020   ALBUMIN 3.3 (L) 03/07/2020   AST 22 03/07/2020   ALT 5 03/07/2020   ALKPHOS 127 (H) 03/07/2020   BILITOT 1.2 03/07/2020   GFRNONAA 56 (L) 03/07/2020   GFRAA >60 03/07/2020    Lab Results  Component Value Date   WBC 3.4 (L) 03/07/2020   NEUTROABS 1.9 03/07/2020   HGB 8.8 (L) 03/07/2020   HCT 27.1 (L) 03/07/2020   MCV 95.4 03/07/2020   PLT 82 (L) 03/07/2020     STUDIES: DG Chest 2 View  Result Date: 02/20/2020 CLINICAL DATA:  [place] today with back pain EXAM: CHEST - 2 VIEW COMPARISON:  09/10/2019 FINDINGS: Heart size upper limits of normal. Tortuous aorta as seen previously. The lungs are clear. No infiltrate, collapse, edema or effusion. Old right clavicle fracture. Compression fracture of T12 is visible as shown at lumbar radiography. IMPRESSION: No active cardiopulmonary disease. T12 compression fracture visible, better shown at lumbar radiography. Old right clavicle fracture. Electronically Signed   By: Nelson Chimes M.D.   On: 02/20/2020 13:59   DG Lumbar Spine Complete  Result Date: 02/20/2020 CLINICAL DATA:  Pain following fall EXAM: LUMBAR SPINE - COMPLETE 4+ VIEW COMPARISON:  CT abdomen and pelvis including bony reformats  April 17, 2019 FINDINGS: Frontal, lateral, spot lumbosacral lateral, and bilateral oblique views were obtained. There are 5 non-rib-bearing lumbar type vertebral bodies. There is lumbar dextroscoliosis. There is moderate wedging of the T12 vertebral body which was not present previously and may well be acute. There is more subtle anterior wedging of the L2 vertebral body. No other fractures are appreciable. There is 2 mm of anterolisthesis of L3 on L4, stable. No new spondylolisthesis. There is moderately severe disc space narrowing at L3-4, L4-5, and L5-S1, also present previously. There is facet osteoarthritic change at L3-4, L4-5, and L5-S1 bilaterally. IMPRESSION: Scoliosis. Probable acute anterior wedge fractures at T12 and L2, more severe at T12. Stable slight anterolisthesis of L3 on L4. No new spondylolisthesis. Multilevel arthropathy, most severe at L3-4, L4-5, L5-S1, also noted on previous CT examination. Electronically Signed   By: Lowella Grip III M.D.   On: 02/20/2020 12:46   DG Pelvis 1-2 Views  Result Date: 02/20/2020 CLINICAL DATA:  Pain following fall EXAM: PELVIS - 1-2 VIEW COMPARISON:  None. FINDINGS: There is no evidence of pelvic fracture or dislocation. There is moderate symmetric narrowing of each hip joint. No erosive change. IMPRESSION: Symmetric narrowing each hip joint.  No fracture or dislocation. Electronically Signed   By: Lowella Grip III M.D.   On: 02/20/2020 12:47   CT Head Wo Contrast  Result Date: 02/20/2020 CLINICAL DATA:  Head trauma history of Parkinson's EXAM: CT HEAD WITHOUT CONTRAST CT CERVICAL SPINE WITHOUT CONTRAST TECHNIQUE: Multidetector CT imaging of the head and cervical spine was performed following the standard protocol without intravenous contrast. Multiplanar CT image reconstructions of the cervical spine were also generated. COMPARISON:  None FINDINGS: CT HEAD FINDINGS Brain: No evidence of acute infarction, hemorrhage, hydrocephalus,  extra-axial collection or mass lesion/mass effect. Signs of atrophy and chronic microvascular ischemic changes in the deep white matter. Vascular: No hyperdense vessel or unexpected calcification. Skull: Normal. Negative for fracture or focal lesion. Sinuses/Orbits: Visualized paranasal sinuses and orbits are unremarkable. Other: None. CT CERVICAL SPINE FINDINGS Alignment: Mild reversal of normal cervical lordosis in the setting of multilevel degenerative change. Degenerative changes greatest in the mid to upper cervical spine. Skull  base and vertebrae: No signs of fracture of the cervical spine or skull base, cranial cervical junction. Signs of pathologic fracture, of the T2 vertebral body. Cortical margins suggest acute process. No retropulsion of fracture elements with fracture extending through the posterior vertebral body, associated with lucent lesion, likely metastatic disease in this location. Lucent area seen in this location on previous imaging evaluation. Soft tissues and spinal canal: No prevertebral fluid or swelling. No visible canal hematoma. Disc levels: Multilevel degenerative changes greatest at with disc space loss, near complete at C3-4, C4-5, C5-6 and C6-7. Facet arthropathy noted throughout the cervical spine greatest at C3-4 on the RIGHT Upper chest: Lung apices are clear. Subacute to chronic RIGHT-sided first rib fracture. Other: None IMPRESSION: 1. No acute intracranial abnormality. 2. Signs of atrophy and chronic microvascular ischemic changes. 3. Pathologic fracture of T2 likely acute, through presumed metastatic focus in the T2 vertebral body. No additional fractures noted in the visualized portion of the thoracic spine or within the cervical spine. 4. Multilevel degenerative changes of the cervical spine. 5. Subacute to chronic RIGHT-sided first rib fracture. Electronically Signed   By: Zetta Bills M.D.   On: 02/20/2020 11:56   CT Cervical Spine Wo Contrast  Result Date:  02/20/2020 CLINICAL DATA:  Head trauma history of Parkinson's EXAM: CT HEAD WITHOUT CONTRAST CT CERVICAL SPINE WITHOUT CONTRAST TECHNIQUE: Multidetector CT imaging of the head and cervical spine was performed following the standard protocol without intravenous contrast. Multiplanar CT image reconstructions of the cervical spine were also generated. COMPARISON:  None FINDINGS: CT HEAD FINDINGS Brain: No evidence of acute infarction, hemorrhage, hydrocephalus, extra-axial collection or mass lesion/mass effect. Signs of atrophy and chronic microvascular ischemic changes in the deep white matter. Vascular: No hyperdense vessel or unexpected calcification. Skull: Normal. Negative for fracture or focal lesion. Sinuses/Orbits: Visualized paranasal sinuses and orbits are unremarkable. Other: None. CT CERVICAL SPINE FINDINGS Alignment: Mild reversal of normal cervical lordosis in the setting of multilevel degenerative change. Degenerative changes greatest in the mid to upper cervical spine. Skull base and vertebrae: No signs of fracture of the cervical spine or skull base, cranial cervical junction. Signs of pathologic fracture, of the T2 vertebral body. Cortical margins suggest acute process. No retropulsion of fracture elements with fracture extending through the posterior vertebral body, associated with lucent lesion, likely metastatic disease in this location. Lucent area seen in this location on previous imaging evaluation. Soft tissues and spinal canal: No prevertebral fluid or swelling. No visible canal hematoma. Disc levels: Multilevel degenerative changes greatest at with disc space loss, near complete at C3-4, C4-5, C5-6 and C6-7. Facet arthropathy noted throughout the cervical spine greatest at C3-4 on the RIGHT Upper chest: Lung apices are clear. Subacute to chronic RIGHT-sided first rib fracture. Other: None IMPRESSION: 1. No acute intracranial abnormality. 2. Signs of atrophy and chronic microvascular ischemic  changes. 3. Pathologic fracture of T2 likely acute, through presumed metastatic focus in the T2 vertebral body. No additional fractures noted in the visualized portion of the thoracic spine or within the cervical spine. 4. Multilevel degenerative changes of the cervical spine. 5. Subacute to chronic RIGHT-sided first rib fracture. Electronically Signed   By: Zetta Bills M.D.   On: 02/20/2020 11:56   CT Thoracic Spine Wo Contrast  Result Date: 02/20/2020 CLINICAL DATA:  Mid to low back pain post fall. EXAM: CT THORACIC AND LUMBAR SPINE WITHOUT CONTRAST TECHNIQUE: Multidetector CT imaging of the thoracic and lumbar spine was performed without contrast. Multiplanar CT  image reconstructions were also generated. COMPARISON:  Lumbar spine radiographs today. Cervical spine CT today, chest CT 04/01/2019 and abdominopelvic CT 04/17/2019 FINDINGS: CT THORACIC SPINE FINDINGS Alignment: Mildly progressive upper thoracic kyphosis without focal angulation or significant listhesis. Vertebrae: As seen on today's cervical spine CT, there is a mild fracture of the T2 vertebral body resulting in less than 25% loss of vertebral body height and no osseous retropulsion. This fracture appears pathologic with an underlying lytic lesion as seen on prior chest CT. There is also an acute nearly horizontal fracture through the T12 vertebral body, resulting in 25% loss of vertebral body height and 4 mm of osseous retropulsion. This fracture does not show any definite pathologic features. Nonacute fractures of the right clavicle and right 1st rib anteriorly are noted. Possible lytic lesion and pathologic fracture of the left 7th rib near the costovertebral junction (image 64/2). Additional lytic lesions are noted within the T5 and T8 vertebral bodies, suspicious for metastatic disease or myeloma. Paraspinal and other soft tissues: No significant paraspinal hematoma. Mild aortic and branch vessel atherosclerosis. Emphysema and bibasilar  atelectasis noted. Disc levels: Relatively mild multilevel thoracic spondylosis with disc space narrowing and anterior osteophytes. There is a small right foraminal disc protrusion at T6-7. No high-grade foraminal narrowing. CT LUMBAR SPINE FINDINGS Segmentation: There are 5 lumbar type vertebral bodies. Alignment: Grade 1 degenerative anterolisthesis at L3-4 and minimal convex right scoliosis. Vertebrae: Lytic lesions are again noted within the L2 vertebral body and upper sacrum, the latter measuring up to 3.1 cm. These have sclerotic margins and may be partially treated. Other lytic lesions are present within the right aspect of the sacrum. These were all present and described on previous abdominal CT. There is a pathologic fracture of the L2 vertebral body with 30% loss of vertebral body height, asymmetric to the left. This fracture does not appear acute. No definite acute fractures in the lumbar spine. Paraspinal and other soft tissues: No enlarging paraspinal soft tissue masses or hematomas. Mild aortic and branch vessel atherosclerosis. Disc levels: L1-2: Progressive loss of disc height with a new extruded left paracentral disc fragment containing vacuum phenomenon extending inferiorly behind the L2 vertebral body. This exerts mass effect on the thecal sac and may contribute to left-sided nerve root encroachment. Facet and ligamentous hypertrophy contribute to mild left foraminal narrowing. L2-3: Chronic degenerative disc disease with loss of disc height and endplate osteophytes asymmetric to the left. Mild facet and ligamentous hypertrophy. Mild spinal stenosis with mild asymmetric narrowing of the left lateral recess and left foramen. L3-4: Chronic degenerative disc disease with loss of disc height, endplate osteophytes and moderate facet hypertrophy. Resulting grade 1 anterolisthesis contributes to moderate multifactorial spinal stenosis, moderate lateral recess and mild foraminal narrowing bilaterally.  L4-5: Probable previous posterior decompression. Chronic loss of disc height with annular disc bulging and endplate osteophytes asymmetric to the right. Mild right foraminal narrowing. L5-S1: Probable posterior decompression. Mild disc bulging and endplate osteophytes asymmetric to the left. Bilateral facet hypertrophy, worse on the right. Mild left foraminal narrowing. IMPRESSION: 1. Acute fractures of the T2 and T12 vertebral bodies, as described. The T2 fracture appears pathologic. 2. Interval pathologic fracture of the L2 vertebral body compared with CTs from September. This fracture does not appear acute. No definite acute fractures in the lumbar spine. 3. Multiple lytic lesions are again noted throughout the thoracolumbar spine and sacrum consistent with multiple myeloma or metastatic disease. Correlate with previous biopsy results. 4. Progressive degenerative changes at L1-2  with a new extruded disc fragment containing vacuum phenomenon extending inferiorly behind the L2 vertebral body on the left. This may contribute to left-sided nerve root encroachment. 5. Aortic Atherosclerosis (ICD10-I70.0) and Emphysema (ICD10-J43.9). Electronically Signed   By: Richardean Sale M.D.   On: 02/20/2020 14:13   CT Lumbar Spine Wo Contrast  Result Date: 02/20/2020 CLINICAL DATA:  Mid to low back pain post fall. EXAM: CT THORACIC AND LUMBAR SPINE WITHOUT CONTRAST TECHNIQUE: Multidetector CT imaging of the thoracic and lumbar spine was performed without contrast. Multiplanar CT image reconstructions were also generated. COMPARISON:  Lumbar spine radiographs today. Cervical spine CT today, chest CT 04/01/2019 and abdominopelvic CT 04/17/2019 FINDINGS: CT THORACIC SPINE FINDINGS Alignment: Mildly progressive upper thoracic kyphosis without focal angulation or significant listhesis. Vertebrae: As seen on today's cervical spine CT, there is a mild fracture of the T2 vertebral body resulting in less than 25% loss of vertebral  body height and no osseous retropulsion. This fracture appears pathologic with an underlying lytic lesion as seen on prior chest CT. There is also an acute nearly horizontal fracture through the T12 vertebral body, resulting in 25% loss of vertebral body height and 4 mm of osseous retropulsion. This fracture does not show any definite pathologic features. Nonacute fractures of the right clavicle and right 1st rib anteriorly are noted. Possible lytic lesion and pathologic fracture of the left 7th rib near the costovertebral junction (image 64/2). Additional lytic lesions are noted within the T5 and T8 vertebral bodies, suspicious for metastatic disease or myeloma. Paraspinal and other soft tissues: No significant paraspinal hematoma. Mild aortic and branch vessel atherosclerosis. Emphysema and bibasilar atelectasis noted. Disc levels: Relatively mild multilevel thoracic spondylosis with disc space narrowing and anterior osteophytes. There is a small right foraminal disc protrusion at T6-7. No high-grade foraminal narrowing. CT LUMBAR SPINE FINDINGS Segmentation: There are 5 lumbar type vertebral bodies. Alignment: Grade 1 degenerative anterolisthesis at L3-4 and minimal convex right scoliosis. Vertebrae: Lytic lesions are again noted within the L2 vertebral body and upper sacrum, the latter measuring up to 3.1 cm. These have sclerotic margins and may be partially treated. Other lytic lesions are present within the right aspect of the sacrum. These were all present and described on previous abdominal CT. There is a pathologic fracture of the L2 vertebral body with 30% loss of vertebral body height, asymmetric to the left. This fracture does not appear acute. No definite acute fractures in the lumbar spine. Paraspinal and other soft tissues: No enlarging paraspinal soft tissue masses or hematomas. Mild aortic and branch vessel atherosclerosis. Disc levels: L1-2: Progressive loss of disc height with a new extruded left  paracentral disc fragment containing vacuum phenomenon extending inferiorly behind the L2 vertebral body. This exerts mass effect on the thecal sac and may contribute to left-sided nerve root encroachment. Facet and ligamentous hypertrophy contribute to mild left foraminal narrowing. L2-3: Chronic degenerative disc disease with loss of disc height and endplate osteophytes asymmetric to the left. Mild facet and ligamentous hypertrophy. Mild spinal stenosis with mild asymmetric narrowing of the left lateral recess and left foramen. L3-4: Chronic degenerative disc disease with loss of disc height, endplate osteophytes and moderate facet hypertrophy. Resulting grade 1 anterolisthesis contributes to moderate multifactorial spinal stenosis, moderate lateral recess and mild foraminal narrowing bilaterally. L4-5: Probable previous posterior decompression. Chronic loss of disc height with annular disc bulging and endplate osteophytes asymmetric to the right. Mild right foraminal narrowing. L5-S1: Probable posterior decompression. Mild disc bulging and endplate  osteophytes asymmetric to the left. Bilateral facet hypertrophy, worse on the right. Mild left foraminal narrowing. IMPRESSION: 1. Acute fractures of the T2 and T12 vertebral bodies, as described. The T2 fracture appears pathologic. 2. Interval pathologic fracture of the L2 vertebral body compared with CTs from September. This fracture does not appear acute. No definite acute fractures in the lumbar spine. 3. Multiple lytic lesions are again noted throughout the thoracolumbar spine and sacrum consistent with multiple myeloma or metastatic disease. Correlate with previous biopsy results. 4. Progressive degenerative changes at L1-2 with a new extruded disc fragment containing vacuum phenomenon extending inferiorly behind the L2 vertebral body on the left. This may contribute to left-sided nerve root encroachment. 5. Aortic Atherosclerosis (ICD10-I70.0) and Emphysema  (ICD10-J43.9). Electronically Signed   By: Richardean Sale M.D.   On: 02/20/2020 14:13   US RENAL  Result Date: 02/20/2020 CLINICAL DATA:  87 year old male with acute renal insufficiency. EXAM: RENAL / URINARY TRACT ULTRASOUND COMPLETE COMPARISON:  None. FINDINGS: Evaluation is limited due to inability of the patient to cooperate with exam. Right Kidney: Renal measurements: 9.8 x 4.7 x 5.5 cm = volume: 132 mL. There is mild parenchyma atrophy. Mild increased echogenicity. There may be minimal hydronephrosis. No shadowing stone. Left Kidney: Renal measurements: 10.0 x 5.1 x 5.2 cm = volume: 140 mL. Mild parenchyma atrophy. Mild increased echogenicity. No hydronephrosis or shadowing stone. Bladder: Appears normal for degree of bladder distention. Bilateral ureteral jets noted. Other: None. IMPRESSION: 1. Mild increased echogenicity in keeping with chronic kidney disease. 2. Possible minimal right hydronephrosis.  No shadowing stone. Electronically Signed   By: Anner Crete M.D.   On: 02/20/2020 17:17    ASSESSMENT: Kappa light chain myeloma.  PLAN:    1. Kappa light chain myeloma: Bone marrow biopsy on May 01, 2019 revealed increased plasma cells of 30 to 40% with kappa light chain restriction.  SPEP is negative and IgG, IgA, and IgM are within normal limits. Kappa free light chains are stable ranging from 805.8 to 950.4. Kappa/lambda light chain ratio is essentially stable at 54.65.  Today's results are pending.  Continue to hold Revlimid given patient's decreased performance status.  Patient will have laboratory work done in 4 weeks and then return to clinic in 5 weeks for further evaluation and discussion on whether or not to reinitiate Revlimid either at 10 mg or 20 mg.  Will hold Zometa at this time 2.  Hospice was also discussed today, but patient is not ready to make that decision.  Appreciate palliative care input.    2.  Right clavicle fracture: Pathologic.  Resolved.  Patient reports no  further interventions are needed by orthopedics. 3.  Hypocalcemia: Mild.  Calcium is 8.7 today. 4.  Iron deficiency anemia: Hemoglobin has trended down to 8.8, monitor. 5.  Thrombocytopenia: Platelet count remains decreased, but has improved to 82.  Hold Revlimid as above. 6.  Leukopenia: Mild, monitor. 7.  Constipation: Recommended OTC Magnesium citrate.  MiraLAX as needed. 8.  Hypokalemia: Chronic and unchanged.  Continue oral supplementation.  9.  Dental pain: Patient reports no interventions by his dentist currently.  Continue to hold Zometa.  Patient expressed understanding and was in agreement with this plan. He also understands that He can call clinic at any time with any questions, concerns, or complaints.    Lloyd Huger, MD   03/07/2020 1:53 PM

## 2020-03-03 ENCOUNTER — Telehealth: Payer: Self-pay

## 2020-03-03 NOTE — Telephone Encounter (Signed)
Telephone call to patient to schedule palliative care visit.  RN LM requesting call back to schedule visit for 03/04/20 at 9:00 AM.

## 2020-03-04 ENCOUNTER — Non-Acute Institutional Stay: Payer: Medicare Other

## 2020-03-04 ENCOUNTER — Other Ambulatory Visit: Payer: Medicare Other

## 2020-03-04 ENCOUNTER — Other Ambulatory Visit: Payer: Self-pay

## 2020-03-04 ENCOUNTER — Telehealth: Payer: Self-pay | Admitting: *Deleted

## 2020-03-04 VITALS — BP 102/64 | HR 70 | Temp 98.1°F | Resp 18 | Wt 170.3 lb

## 2020-03-04 DIAGNOSIS — Z515 Encounter for palliative care: Secondary | ICD-10-CM

## 2020-03-04 NOTE — Telephone Encounter (Signed)
Initial plan was to discuss treatment options including discontinuation of treatment at his clinic visit.  If patient decides to forego further treatment, hospice would be appropriate.  Recommend keeping follow-up appointment on Friday to further discuss.  Please ensure patient has an appointment with Josh as well on that day.

## 2020-03-04 NOTE — Progress Notes (Signed)
PATIENT NAME: Adam Moss DOB: April 11, 1934 MRN: [id number]  PRIMARY CARE PROVIDER: Perrin Maltese, MD  RESPONSIBLE PARTY:  Acct ID - Guarantor Home Phone Work Phone Relationship Acct Type  [id number] Adam [phone]  Self P/F     [address] [place], [address], [place], [place] [address]    PLAN OF CARE and INTERVENTIONS:               1.  GOALS OF CARE/ ADVANCE CARE PLANNING:  Patient would like to get stronger and resume treatment for multiple myeloma.               2.  PATIENT/CAREGIVER EDUCATION:  Education on fall precautions, education on s/s of infection, reviewed meds, support                4. PERSONAL EMERGENCY PLAN: Patient currently at [facility] for rehab and has 24/7 nursing care in place.               5.  DISEASE STATUS: RN made scheduled palliative care visit. Patient currently skilled level at [facility]. Patient suffered a fall and was taken to the ER and hospitalized. Patient was released to [facility] for Rehab. Patient lying in bed alert. Patient reports he has some intermittent back pain. Patient currently rates pain at a 2 on pain scale. Patient not receiving oral chemo for treatment of multiple myeloma at the present time. Patient states he would like to restart medication for myleoma. Nurse placed called to cancer center to determine if Adam Moss is planning on patient restarting treatment. Patient has bruising to arms and upper chest. Patient states he did not sustain bruises after fall. Patients current weight 170.3 pounds per facility nurse Adam Moss.  Patient is weak. Patient states he can ambulate a few steps with physical therapy but is unable to walk on his own. Patient reports his appetite is improving. Patient does not need to live alone without caregivers as he has suffered multiple falls and can no longer ambulate. Patients vital signs are stable. Patient reports he is sleeping better but continues to wake up due to having to urinate. Patient  denies having any shortness of breath. Patient continues to have a cough that he reports s chronic and has had for years. Patient has compression device but states he is not using device like he should on legs. Patient has 2+ edema in his lower extremities. Patient reports he has cracked vertebrae that was diagnosed at the hospital. Facility nurse Adam Moss report patients potassium was changed to a capsule and finasteride was added for  BPH. Adam Moss Triage RN from cancer center returned RN's call and states Dr Adam Moss is planning on discussing patient continuing treatment when patient sees him on Friday.  RN updated Adam Moss on patients current status.  Patient and facility staff encouraged to contact palliative care with questions or concerns.     HISTORY OF PRESENT ILLNESS:  Patient is a 86 year old male with multiple myleoma.  Patient currently at [facility] for rehab after suffering a fall.  Patient is followed by palliative care and is seen monthly and PRN.     CODE STATUS:  DNR ADVANCED DIRECTIVES: Y MOST FORM: NO PPS: 40%   PHYSICAL EXAM:   VITALS: Today's Vitals   03/04/20 0943  BP: 102/64  Pulse: 70  Resp: 18  Temp: 98.1 F (36.7 C)  TempSrc: Temporal  SpO2: 94%  Weight: 170 lb 4.8 oz (77.2 kg)  PainSc: 2  PainLoc: Back    LUNGS: clear to auscultation  CARDIAC: Cor RRR  EXTREMITIES: 2+ edema SKIN: bruising to arms and upper chest  NEURO: positive for gait problems, tremors and weakness       Adam Simmer, RN

## 2020-03-04 NOTE — Telephone Encounter (Signed)
Vergie called and is asking if patient will continue with treatment and that he wants to when able or if treatment will be stopped. She said that if it stops, he would be appropriate for hospice care. She reports that he remains weak and is going to need a higher level of care than what he currently has. She is asking for a return call.  He does have an appointment Friday with you.

## 2020-03-04 NOTE — Telephone Encounter (Signed)
Appointment added to [facility] schedule for Friday. I attempted to call patient and got his voice mail and left message regarding appointment for Friday and that appointment was added for Palliative Care as well. I called Vergie and let her know that Dr Grayland Ormond will discuss treatment on visit Friday

## 2020-03-07 ENCOUNTER — Other Ambulatory Visit: Payer: Self-pay

## 2020-03-07 ENCOUNTER — Inpatient Hospital Stay (HOSPITAL_BASED_OUTPATIENT_CLINIC_OR_DEPARTMENT_OTHER): Payer: No Typology Code available for payment source | Admitting: Hospice and Palliative Medicine

## 2020-03-07 ENCOUNTER — Inpatient Hospital Stay: Payer: No Typology Code available for payment source

## 2020-03-07 ENCOUNTER — Inpatient Hospital Stay: Payer: No Typology Code available for payment source | Attending: Oncology | Admitting: Oncology

## 2020-03-07 ENCOUNTER — Encounter: Payer: Self-pay | Admitting: Oncology

## 2020-03-07 VITALS — BP 151/96 | HR 95 | Temp 97.8°F | Resp 18 | Ht 70.0 in

## 2020-03-07 DIAGNOSIS — C9000 Multiple myeloma not having achieved remission: Secondary | ICD-10-CM

## 2020-03-07 DIAGNOSIS — I129 Hypertensive chronic kidney disease with stage 1 through stage 4 chronic kidney disease, or unspecified chronic kidney disease: Secondary | ICD-10-CM | POA: Insufficient documentation

## 2020-03-07 DIAGNOSIS — Z515 Encounter for palliative care: Secondary | ICD-10-CM | POA: Diagnosis not present

## 2020-03-07 DIAGNOSIS — K5900 Constipation, unspecified: Secondary | ICD-10-CM | POA: Insufficient documentation

## 2020-03-07 DIAGNOSIS — Z85828 Personal history of other malignant neoplasm of skin: Secondary | ICD-10-CM | POA: Diagnosis not present

## 2020-03-07 DIAGNOSIS — J449 Chronic obstructive pulmonary disease, unspecified: Secondary | ICD-10-CM | POA: Diagnosis not present

## 2020-03-07 DIAGNOSIS — G20 Parkinson's disease: Secondary | ICD-10-CM | POA: Diagnosis not present

## 2020-03-07 DIAGNOSIS — E785 Hyperlipidemia, unspecified: Secondary | ICD-10-CM | POA: Insufficient documentation

## 2020-03-07 LAB — CBC WITH DIFFERENTIAL/PLATELET
Abs Immature Granulocytes: 0.02 10*3/uL (ref 0.00–0.07)
Basophils Absolute: 0 10*3/uL (ref 0.0–0.1)
Basophils Relative: 1 %
Eosinophils Absolute: 0 10*3/uL (ref 0.0–0.5)
Eosinophils Relative: 1 %
HCT: 27.1 % — ABNORMAL LOW (ref 39.0–52.0)
Hemoglobin: 8.8 g/dL — ABNORMAL LOW (ref 13.0–17.0)
Immature Granulocytes: 1 %
Lymphocytes Relative: 33 %
Lymphs Abs: 1.1 10*3/uL (ref 0.7–4.0)
MCH: 31 pg (ref 26.0–34.0)
MCHC: 32.5 g/dL (ref 30.0–36.0)
MCV: 95.4 fL (ref 80.0–100.0)
Monocytes Absolute: 0.3 10*3/uL (ref 0.1–1.0)
Monocytes Relative: 7 %
Neutro Abs: 1.9 10*3/uL (ref 1.7–7.7)
Neutrophils Relative %: 57 %
Platelets: 82 10*3/uL — ABNORMAL LOW (ref 150–400)
RBC: 2.84 MIL/uL — ABNORMAL LOW (ref 4.22–5.81)
RDW: 20.3 % — ABNORMAL HIGH (ref 11.5–15.5)
WBC: 3.4 10*3/uL — ABNORMAL LOW (ref 4.0–10.5)
nRBC: 0.6 % — ABNORMAL HIGH (ref 0.0–0.2)

## 2020-03-07 LAB — COMPREHENSIVE METABOLIC PANEL
ALT: 5 U/L (ref 0–44)
AST: 22 U/L (ref 15–41)
Albumin: 3.3 g/dL — ABNORMAL LOW (ref 3.5–5.0)
Alkaline Phosphatase: 127 U/L — ABNORMAL HIGH (ref 38–126)
Anion gap: 12 (ref 5–15)
BUN: 23 mg/dL (ref 8–23)
CO2: 30 mmol/L (ref 22–32)
Calcium: 8.7 mg/dL — ABNORMAL LOW (ref 8.9–10.3)
Chloride: 89 mmol/L — ABNORMAL LOW (ref 98–111)
Creatinine, Ser: 1.18 mg/dL (ref 0.61–1.24)
GFR calc Af Amer: >60 mL/min (ref >60)
GFR calc non Af Amer: 56 mL/min — ABNORMAL LOW (ref >60)
Glucose, Bld: 136 mg/dL — ABNORMAL HIGH (ref 70–99)
Potassium: 3.3 mmol/L — ABNORMAL LOW (ref 3.5–5.1)
Sodium: 131 mmol/L — ABNORMAL LOW (ref 135–145)
Total Bilirubin: 1.2 mg/dL (ref 0.3–1.2)
Total Protein: 6.3 g/dL — ABNORMAL LOW (ref 6.5–8.1)

## 2020-03-07 MED ORDER — SODIUM CHLORIDE 0.9 % IV SOLN
Freq: Once | INTRAVENOUS | Status: DC
  Filled 2020-03-07: qty 250

## 2020-03-07 MED ORDER — ZOLEDRONIC ACID 4 MG/100ML IV SOLN
4.0000 mg | Freq: Once | INTRAVENOUS | Status: DC
  Filled 2020-03-07: qty 100

## 2020-03-07 NOTE — Progress Notes (Signed)
[facility]  [phone] [phone]   Name: Adam Moss Date: 03/07/2020 MRN: [id number]  DOB: 08/14/1933  Patient Care Team: Perrin Maltese, MD as PCP - General (Internal Medicine) Lloyd Huger, MD as Consulting Physician (Hematology and Oncology)    REASON FOR CONSULTATION: Adam Moss is a 86 y.o. male with multiple medical problems including multiple myeloma, Parkinson's disease, COPD, venous insufficiency with lower extremity edema, hypertension, and hyperlipidemia.  Patient was hospitalized 02/20/2020-02/25/2020 with weakness after mechanical fall at home.  Patient was found to have a thoracic compression fracture.  He was sent to rehab.  Palliative care was consulted help address goals and manage ongoing symptoms.  SOCIAL HISTORY:     reports that he has never smoked. He has never used smokeless tobacco. He reports that he does not drink alcohol and does not use drugs.   Patient is a widower.  He lives at the [facility].  He has a son and daughter who are involved in his care.  Patient has another son who is now deceased.  Patient has a PhD from 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  ADVANCE DIRECTIVES:  Not on file  CODE STATUS: DNR  PAST MEDICAL HISTORY: Past Medical History:  Diagnosis Date  . Cancer ([place])    skin  . COPD (chronic obstructive pulmonary disease) ([place])   . Hyperlipemia   . Hypertension   . Lymphedema of both lower extremities   . Parkinson disease ([place])   . Parkinsonism ([place]) 02/21/2015  . Spinal stenosis   . Tremor     PAST SURGICAL HISTORY:  Past Surgical History:  Procedure Laterality Date  . APPENDECTOMY    . CATARACT EXTRACTION    . CHOLECYSTECTOMY    . TONSILLECTOMY    . VASECTOMY      HEMATOLOGY/ONCOLOGY HISTORY:  Oncology History  Multiple myeloma ([place])    05/07/2019 Initial Diagnosis   Multiple myeloma ([facility])     ALLERGIES:  is allergic to carbidopa-levodopa, oxycodone-acetaminophen, tyloxapol, acetaminophen, and pramipexole.  MEDICATIONS:  Current Outpatient Medications  Medication Sig Dispense Refill  . aspirin 81 MG tablet Take 81 mg by mouth daily.    . Calcium Carbonate-Vitamin D (OYSTER SHELL CALCIUM/D) 250-125 MG-UNIT TABS Take 1 tablet by mouth daily.     . Calcium Citrate 250 MG TABS Take 1 tablet by mouth daily.     . Carbidopa-Levodopa ER (RYTARY) 48.75-195 MG CPCR Take 195 mg by mouth 5 (five) times daily. 450 capsule 3  . cetirizine (ZYRTEC) 10 MG tablet Take 10 mg by mouth daily.    . finasteride (PROSCAR) 5 MG tablet Take 1 tablet (5 mg total) by mouth daily. 90 tablet 3  . fluticasone (FLONASE) 50 MCG/ACT nasal spray Place 1 spray into both nostrils daily as needed for allergies.     . Fluticasone-Umeclidin-Vilant (TRELEGY ELLIPTA) 100-62.5-25 MCG/INH AEPB Inhale 1 spray into the lungs daily.     . furosemide (LASIX) 20 MG tablet Take 40 mg by mouth 2 (two) times daily.     Marland Kitchen ipratropium (ATROVENT) 0.03 % nasal spray Place 0.03 mLs into both nostrils 3 (three) times daily as needed.  11  . KLOR-CON M20 20 MEQ tablet TAKE 1 TABLET BY MOUTH TWICE A DAY (Patient taking differently: Take 20 mEq by mouth 2 (two) times daily. ) 180 tablet 1  . lenalidomide (REVLIMID) 20 MG  capsule Take 1 capsule (20 mg total) by mouth daily. Take for 21 days then hold for 7 days. Repeat every 28 days. HOLD THIS MEDICATION UNTIL ONCOLOGIST TELLS YOU TO RESTART IT 21 capsule 0  . niacin 500 MG tablet Take 500 mg by mouth every morning.     Marland Kitchen omeprazole (PRILOSEC) 40 MG capsule Take 40 mg by mouth daily.    . prochlorperazine (COMPAZINE) 10 MG tablet Take 1 tablet (10 mg total) by mouth every 6 (six) hours as needed for nausea or vomiting. (Patient not taking: Reported on 02/20/2020) 30 tablet 0  . rosuvastatin (CRESTOR) 20 MG tablet Take 20 mg by  mouth at bedtime.    . sertraline (ZOLOFT) 25 MG tablet Take 25 mg by mouth at bedtime.     Marland Kitchen spironolactone (ALDACTONE) 25 MG tablet Take 25 mg by mouth daily.    . tamsulosin (FLOMAX) 0.4 MG CAPS capsule Take 1 capsule (0.4 mg total) by mouth daily. 90 capsule 3  . vitamin B-12 (CYANOCOBALAMIN) 1000 MCG tablet Take 1,000 mcg by mouth daily.     No current facility-administered medications for this visit.    VITAL SIGNS: There were no vitals taken for this visit. There were no vitals filed for this visit.  Estimated body mass index is 24.44 kg/m as calculated from the following:   Height as of 02/20/20: '5\' 10"'  (1.778 m).   Weight as of 03/04/20: 170 lb 4.8 oz (77.2 kg).  LABS: CBC:    Component Value Date/Time   WBC 2.0 (L) 02/25/2020 0428   HGB 7.7 (L) 02/25/2020 0428   HGB 15.8 10/27/2014 0017   HCT 24.1 (L) 02/25/2020 0428   HCT 46.4 10/27/2014 0017   PLT 51 (L) 02/25/2020 0428   PLT 179 10/27/2014 0017   MCV 94.9 02/25/2020 0428   MCV 90 10/27/2014 0017   NEUTROABS 1.9 01/22/2020 1014   LYMPHSABS 1.7 01/22/2020 1014   MONOABS 0.5 01/22/2020 1014   EOSABS 0.2 01/22/2020 1014   BASOSABS 0.0 01/22/2020 1014   Comprehensive Metabolic Panel:    Component Value Date/Time   NA 130 (L) 02/25/2020 0428   NA 139 10/27/2014 0017   K 3.8 02/25/2020 0428   K 3.5 10/27/2014 0017   CL 93 (L) 02/25/2020 0428   CL 103 10/27/2014 0017   CO2 29 02/25/2020 0428   CO2 28 10/27/2014 0017   BUN 20 02/25/2020 0428   BUN 13 10/27/2014 0017   CREATININE 1.26 (H) 02/25/2020 0428   CREATININE 0.96 10/27/2014 0017   GLUCOSE 122 (H) 02/25/2020 0428   GLUCOSE 111 (H) 10/27/2014 0017   CALCIUM 7.8 (L) 02/25/2020 0428   CALCIUM 9.2 10/27/2014 0017   AST 80 (H) 01/22/2020 1014   ALT 12 01/22/2020 1014   ALKPHOS 82 01/22/2020 1014   BILITOT 0.8 01/22/2020 1014   PROT 6.8 01/22/2020 1014   ALBUMIN 3.7 01/22/2020 1014    RADIOGRAPHIC STUDIES: DG Chest 2 View  Result Date:  02/20/2020 CLINICAL DATA:  [place] today with back pain EXAM: CHEST - 2 VIEW COMPARISON:  09/10/2019 FINDINGS: Heart size upper limits of normal. Tortuous aorta as seen previously. The lungs are clear. No infiltrate, collapse, edema or effusion. Old right clavicle fracture. Compression fracture of T12 is visible as shown at lumbar radiography. IMPRESSION: No active cardiopulmonary disease. T12 compression fracture visible, better shown at lumbar radiography. Old right clavicle fracture. Electronically Signed   By: Nelson Chimes M.D.   On: 02/20/2020 13:59   DG  Lumbar Spine Complete  Result Date: 02/20/2020 CLINICAL DATA:  Pain following fall EXAM: LUMBAR SPINE - COMPLETE 4+ VIEW COMPARISON:  CT abdomen and pelvis including bony reformats April 17, 2019 FINDINGS: Frontal, lateral, spot lumbosacral lateral, and bilateral oblique views were obtained. There are 5 non-rib-bearing lumbar type vertebral bodies. There is lumbar dextroscoliosis. There is moderate wedging of the T12 vertebral body which was not present previously and may well be acute. There is more subtle anterior wedging of the L2 vertebral body. No other fractures are appreciable. There is 2 mm of anterolisthesis of L3 on L4, stable. No new spondylolisthesis. There is moderately severe disc space narrowing at L3-4, L4-5, and L5-S1, also present previously. There is facet osteoarthritic change at L3-4, L4-5, and L5-S1 bilaterally. IMPRESSION: Scoliosis. Probable acute anterior wedge fractures at T12 and L2, more severe at T12. Stable slight anterolisthesis of L3 on L4. No new spondylolisthesis. Multilevel arthropathy, most severe at L3-4, L4-5, L5-S1, also noted on previous CT examination. Electronically Signed   By: Lowella Grip III M.D.   On: 02/20/2020 12:46   DG Pelvis 1-2 Views  Result Date: 02/20/2020 CLINICAL DATA:  Pain following fall EXAM: PELVIS - 1-2 VIEW COMPARISON:  None. FINDINGS: There is no evidence of pelvic fracture or  dislocation. There is moderate symmetric narrowing of each hip joint. No erosive change. IMPRESSION: Symmetric narrowing each hip joint.  No fracture or dislocation. Electronically Signed   By: Lowella Grip III M.D.   On: 02/20/2020 12:47   CT Head Wo Contrast  Result Date: 02/20/2020 CLINICAL DATA:  Head trauma history of Parkinson's EXAM: CT HEAD WITHOUT CONTRAST CT CERVICAL SPINE WITHOUT CONTRAST TECHNIQUE: Multidetector CT imaging of the head and cervical spine was performed following the standard protocol without intravenous contrast. Multiplanar CT image reconstructions of the cervical spine were also generated. COMPARISON:  None FINDINGS: CT HEAD FINDINGS Brain: No evidence of acute infarction, hemorrhage, hydrocephalus, extra-axial collection or mass lesion/mass effect. Signs of atrophy and chronic microvascular ischemic changes in the deep white matter. Vascular: No hyperdense vessel or unexpected calcification. Skull: Normal. Negative for fracture or focal lesion. Sinuses/Orbits: Visualized paranasal sinuses and orbits are unremarkable. Other: None. CT CERVICAL SPINE FINDINGS Alignment: Mild reversal of normal cervical lordosis in the setting of multilevel degenerative change. Degenerative changes greatest in the mid to upper cervical spine. Skull base and vertebrae: No signs of fracture of the cervical spine or skull base, cranial cervical junction. Signs of pathologic fracture, of the T2 vertebral body. Cortical margins suggest acute process. No retropulsion of fracture elements with fracture extending through the posterior vertebral body, associated with lucent lesion, likely metastatic disease in this location. Lucent area seen in this location on previous imaging evaluation. Soft tissues and spinal canal: No prevertebral fluid or swelling. No visible canal hematoma. Disc levels: Multilevel degenerative changes greatest at with disc space loss, near complete at C3-4, C4-5, C5-6 and C6-7. Facet  arthropathy noted throughout the cervical spine greatest at C3-4 on the RIGHT Upper chest: Lung apices are clear. Subacute to chronic RIGHT-sided first rib fracture. Other: None IMPRESSION: 1. No acute intracranial abnormality. 2. Signs of atrophy and chronic microvascular ischemic changes. 3. Pathologic fracture of T2 likely acute, through presumed metastatic focus in the T2 vertebral body. No additional fractures noted in the visualized portion of the thoracic spine or within the cervical spine. 4. Multilevel degenerative changes of the cervical spine. 5. Subacute to chronic RIGHT-sided first rib fracture. Electronically Signed   By: Cay Schillings  Wile M.D.   On: 02/20/2020 11:56   CT Cervical Spine Wo Contrast  Result Date: 02/20/2020 CLINICAL DATA:  Head trauma history of Parkinson's EXAM: CT HEAD WITHOUT CONTRAST CT CERVICAL SPINE WITHOUT CONTRAST TECHNIQUE: Multidetector CT imaging of the head and cervical spine was performed following the standard protocol without intravenous contrast. Multiplanar CT image reconstructions of the cervical spine were also generated. COMPARISON:  None FINDINGS: CT HEAD FINDINGS Brain: No evidence of acute infarction, hemorrhage, hydrocephalus, extra-axial collection or mass lesion/mass effect. Signs of atrophy and chronic microvascular ischemic changes in the deep white matter. Vascular: No hyperdense vessel or unexpected calcification. Skull: Normal. Negative for fracture or focal lesion. Sinuses/Orbits: Visualized paranasal sinuses and orbits are unremarkable. Other: None. CT CERVICAL SPINE FINDINGS Alignment: Mild reversal of normal cervical lordosis in the setting of multilevel degenerative change. Degenerative changes greatest in the mid to upper cervical spine. Skull base and vertebrae: No signs of fracture of the cervical spine or skull base, cranial cervical junction. Signs of pathologic fracture, of the T2 vertebral body. Cortical margins suggest acute process. No  retropulsion of fracture elements with fracture extending through the posterior vertebral body, associated with lucent lesion, likely metastatic disease in this location. Lucent area seen in this location on previous imaging evaluation. Soft tissues and spinal canal: No prevertebral fluid or swelling. No visible canal hematoma. Disc levels: Multilevel degenerative changes greatest at with disc space loss, near complete at C3-4, C4-5, C5-6 and C6-7. Facet arthropathy noted throughout the cervical spine greatest at C3-4 on the RIGHT Upper chest: Lung apices are clear. Subacute to chronic RIGHT-sided first rib fracture. Other: None IMPRESSION: 1. No acute intracranial abnormality. 2. Signs of atrophy and chronic microvascular ischemic changes. 3. Pathologic fracture of T2 likely acute, through presumed metastatic focus in the T2 vertebral body. No additional fractures noted in the visualized portion of the thoracic spine or within the cervical spine. 4. Multilevel degenerative changes of the cervical spine. 5. Subacute to chronic RIGHT-sided first rib fracture. Electronically Signed   By: Zetta Bills M.D.   On: 02/20/2020 11:56   CT Thoracic Spine Wo Contrast  Result Date: 02/20/2020 CLINICAL DATA:  Mid to low back pain post fall. EXAM: CT THORACIC AND LUMBAR SPINE WITHOUT CONTRAST TECHNIQUE: Multidetector CT imaging of the thoracic and lumbar spine was performed without contrast. Multiplanar CT image reconstructions were also generated. COMPARISON:  Lumbar spine radiographs today. Cervical spine CT today, chest CT 04/01/2019 and abdominopelvic CT 04/17/2019 FINDINGS: CT THORACIC SPINE FINDINGS Alignment: Mildly progressive upper thoracic kyphosis without focal angulation or significant listhesis. Vertebrae: As seen on today's cervical spine CT, there is a mild fracture of the T2 vertebral body resulting in less than 25% loss of vertebral body height and no osseous retropulsion. This fracture appears pathologic  with an underlying lytic lesion as seen on prior chest CT. There is also an acute nearly horizontal fracture through the T12 vertebral body, resulting in 25% loss of vertebral body height and 4 mm of osseous retropulsion. This fracture does not show any definite pathologic features. Nonacute fractures of the right clavicle and right 1st rib anteriorly are noted. Possible lytic lesion and pathologic fracture of the left 7th rib near the costovertebral junction (image 64/2). Additional lytic lesions are noted within the T5 and T8 vertebral bodies, suspicious for metastatic disease or myeloma. Paraspinal and other soft tissues: No significant paraspinal hematoma. Mild aortic and branch vessel atherosclerosis. Emphysema and bibasilar atelectasis noted. Disc levels: Relatively mild multilevel thoracic  spondylosis with disc space narrowing and anterior osteophytes. There is a small right foraminal disc protrusion at T6-7. No high-grade foraminal narrowing. CT LUMBAR SPINE FINDINGS Segmentation: There are 5 lumbar type vertebral bodies. Alignment: Grade 1 degenerative anterolisthesis at L3-4 and minimal convex right scoliosis. Vertebrae: Lytic lesions are again noted within the L2 vertebral body and upper sacrum, the latter measuring up to 3.1 cm. These have sclerotic margins and may be partially treated. Other lytic lesions are present within the right aspect of the sacrum. These were all present and described on previous abdominal CT. There is a pathologic fracture of the L2 vertebral body with 30% loss of vertebral body height, asymmetric to the left. This fracture does not appear acute. No definite acute fractures in the lumbar spine. Paraspinal and other soft tissues: No enlarging paraspinal soft tissue masses or hematomas. Mild aortic and branch vessel atherosclerosis. Disc levels: L1-2: Progressive loss of disc height with a new extruded left paracentral disc fragment containing vacuum phenomenon extending  inferiorly behind the L2 vertebral body. This exerts mass effect on the thecal sac and may contribute to left-sided nerve root encroachment. Facet and ligamentous hypertrophy contribute to mild left foraminal narrowing. L2-3: Chronic degenerative disc disease with loss of disc height and endplate osteophytes asymmetric to the left. Mild facet and ligamentous hypertrophy. Mild spinal stenosis with mild asymmetric narrowing of the left lateral recess and left foramen. L3-4: Chronic degenerative disc disease with loss of disc height, endplate osteophytes and moderate facet hypertrophy. Resulting grade 1 anterolisthesis contributes to moderate multifactorial spinal stenosis, moderate lateral recess and mild foraminal narrowing bilaterally. L4-5: Probable previous posterior decompression. Chronic loss of disc height with annular disc bulging and endplate osteophytes asymmetric to the right. Mild right foraminal narrowing. L5-S1: Probable posterior decompression. Mild disc bulging and endplate osteophytes asymmetric to the left. Bilateral facet hypertrophy, worse on the right. Mild left foraminal narrowing. IMPRESSION: 1. Acute fractures of the T2 and T12 vertebral bodies, as described. The T2 fracture appears pathologic. 2. Interval pathologic fracture of the L2 vertebral body compared with CTs from September. This fracture does not appear acute. No definite acute fractures in the lumbar spine. 3. Multiple lytic lesions are again noted throughout the thoracolumbar spine and sacrum consistent with multiple myeloma or metastatic disease. Correlate with previous biopsy results. 4. Progressive degenerative changes at L1-2 with a new extruded disc fragment containing vacuum phenomenon extending inferiorly behind the L2 vertebral body on the left. This may contribute to left-sided nerve root encroachment. 5. Aortic Atherosclerosis (ICD10-I70.0) and Emphysema (ICD10-J43.9). Electronically Signed   By: Richardean Sale M.D.   On:  02/20/2020 14:13   CT Lumbar Spine Wo Contrast  Result Date: 02/20/2020 CLINICAL DATA:  Mid to low back pain post fall. EXAM: CT THORACIC AND LUMBAR SPINE WITHOUT CONTRAST TECHNIQUE: Multidetector CT imaging of the thoracic and lumbar spine was performed without contrast. Multiplanar CT image reconstructions were also generated. COMPARISON:  Lumbar spine radiographs today. Cervical spine CT today, chest CT 04/01/2019 and abdominopelvic CT 04/17/2019 FINDINGS: CT THORACIC SPINE FINDINGS Alignment: Mildly progressive upper thoracic kyphosis without focal angulation or significant listhesis. Vertebrae: As seen on today's cervical spine CT, there is a mild fracture of the T2 vertebral body resulting in less than 25% loss of vertebral body height and no osseous retropulsion. This fracture appears pathologic with an underlying lytic lesion as seen on prior chest CT. There is also an acute nearly horizontal fracture through the T12 vertebral body, resulting in 25% loss  of vertebral body height and 4 mm of osseous retropulsion. This fracture does not show any definite pathologic features. Nonacute fractures of the right clavicle and right 1st rib anteriorly are noted. Possible lytic lesion and pathologic fracture of the left 7th rib near the costovertebral junction (image 64/2). Additional lytic lesions are noted within the T5 and T8 vertebral bodies, suspicious for metastatic disease or myeloma. Paraspinal and other soft tissues: No significant paraspinal hematoma. Mild aortic and branch vessel atherosclerosis. Emphysema and bibasilar atelectasis noted. Disc levels: Relatively mild multilevel thoracic spondylosis with disc space narrowing and anterior osteophytes. There is a small right foraminal disc protrusion at T6-7. No high-grade foraminal narrowing. CT LUMBAR SPINE FINDINGS Segmentation: There are 5 lumbar type vertebral bodies. Alignment: Grade 1 degenerative anterolisthesis at L3-4 and minimal convex right  scoliosis. Vertebrae: Lytic lesions are again noted within the L2 vertebral body and upper sacrum, the latter measuring up to 3.1 cm. These have sclerotic margins and may be partially treated. Other lytic lesions are present within the right aspect of the sacrum. These were all present and described on previous abdominal CT. There is a pathologic fracture of the L2 vertebral body with 30% loss of vertebral body height, asymmetric to the left. This fracture does not appear acute. No definite acute fractures in the lumbar spine. Paraspinal and other soft tissues: No enlarging paraspinal soft tissue masses or hematomas. Mild aortic and branch vessel atherosclerosis. Disc levels: L1-2: Progressive loss of disc height with a new extruded left paracentral disc fragment containing vacuum phenomenon extending inferiorly behind the L2 vertebral body. This exerts mass effect on the thecal sac and may contribute to left-sided nerve root encroachment. Facet and ligamentous hypertrophy contribute to mild left foraminal narrowing. L2-3: Chronic degenerative disc disease with loss of disc height and endplate osteophytes asymmetric to the left. Mild facet and ligamentous hypertrophy. Mild spinal stenosis with mild asymmetric narrowing of the left lateral recess and left foramen. L3-4: Chronic degenerative disc disease with loss of disc height, endplate osteophytes and moderate facet hypertrophy. Resulting grade 1 anterolisthesis contributes to moderate multifactorial spinal stenosis, moderate lateral recess and mild foraminal narrowing bilaterally. L4-5: Probable previous posterior decompression. Chronic loss of disc height with annular disc bulging and endplate osteophytes asymmetric to the right. Mild right foraminal narrowing. L5-S1: Probable posterior decompression. Mild disc bulging and endplate osteophytes asymmetric to the left. Bilateral facet hypertrophy, worse on the right. Mild left foraminal narrowing. IMPRESSION: 1.  Acute fractures of the T2 and T12 vertebral bodies, as described. The T2 fracture appears pathologic. 2. Interval pathologic fracture of the L2 vertebral body compared with CTs from September. This fracture does not appear acute. No definite acute fractures in the lumbar spine. 3. Multiple lytic lesions are again noted throughout the thoracolumbar spine and sacrum consistent with multiple myeloma or metastatic disease. Correlate with previous biopsy results. 4. Progressive degenerative changes at L1-2 with a new extruded disc fragment containing vacuum phenomenon extending inferiorly behind the L2 vertebral body on the left. This may contribute to left-sided nerve root encroachment. 5. Aortic Atherosclerosis (ICD10-I70.0) and Emphysema (ICD10-J43.9). Electronically Signed   By: Richardean Sale M.D.   On: 02/20/2020 14:13   US RENAL  Result Date: 02/20/2020 CLINICAL DATA:  86 year old male with acute renal insufficiency. EXAM: RENAL / URINARY TRACT ULTRASOUND COMPLETE COMPARISON:  None. FINDINGS: Evaluation is limited due to inability of the patient to cooperate with exam. Right Kidney: Renal measurements: 9.8 x 4.7 x 5.5 cm = volume: 132 mL.  There is mild parenchyma atrophy. Mild increased echogenicity. There may be minimal hydronephrosis. No shadowing stone. Left Kidney: Renal measurements: 10.0 x 5.1 x 5.2 cm = volume: 140 mL. Mild parenchyma atrophy. Mild increased echogenicity. No hydronephrosis or shadowing stone. Bladder: Appears normal for degree of bladder distention. Bilateral ureteral jets noted. Other: None. IMPRESSION: 1. Mild increased echogenicity in keeping with chronic kidney disease. 2. Possible minimal right hydronephrosis.  No shadowing stone. Electronically Signed   By: Anner Crete M.D.   On: 02/20/2020 17:17    PERFORMANCE STATUS (ECOG) : 2 - Symptomatic, <50% confined to bed  Review of Systems Unless otherwise noted, a complete review of systems is negative.  Physical  Exam General: NAD, frail appearing Pulmonary: unlabored Extremities: + LE edema, no joint deformities Skin: no rashes Neurological: Weakness but otherwise nonfocal  IMPRESSION: I met with patient and his daughter-in-law in the clinic today.  Patient feels that he is doing some better following discharge from the hospital.  He is currently at rehab with eventual plan to transition back to his apartment with hired caregivers.  Patient says that he is ambulating some but is mostly in the chair or bed.  He does not expect that he will return to his previous functional baseline.  We talked about the possibility for future decline.  Patient is undecided on if he wants to continue cancer treatment.  We did discuss the option of discontinuing Revlimid and focusing more on supportive care with possible involvement of hospice at home.  As patient is undecided, will likely bring him back in about a month for follow-up and consideration of future treatment versus supportive care at that time.  Symptomatically, patient endorses chronic constipation.  I reviewed his MAR from the facility.  He is currently taking senna 2 tablets twice daily.  He also has additional senna ordered as needed and lactulose as needed but does not appear to have received doses of either.  Will order scheduled daily MiraLAX.  PLAN: -Continue current scope of treatment -Palliative care to follow at SNF -Patient considering treatment versus supportive care -DNR/DNI -Continue daily senna and add scheduled MiraLAX -RTC in about a month  Case and plan discussed with Dr. Grayland Ormond  Patient expressed understanding and was in agreement with this plan. He also understands that He can call the clinic at any time with any questions, concerns, or complaints.     Time Total: 30 minutes  Visit consisted of counseling and education dealing with the complex and emotionally intense issues of symptom management and palliative care in the  setting of serious and potentially life-threatening illness.Greater than 50%  of this time was spent counseling and coordinating care related to the above assessment and plan.  Signed by: Altha Harm, PhD, NP-C

## 2020-03-07 NOTE — Progress Notes (Signed)
No new changes noted today 

## 2020-03-07 NOTE — Progress Notes (Signed)
Per Melissa CMA, Dr Grayland Ormond states no zometa for pt today.

## 2020-03-08 LAB — IGG, IGA, IGM
IgA: 158 mg/dL (ref 61–437)
IgG (Immunoglobin G), Serum: 630 mg/dL (ref 603–1613)
IgM (Immunoglobulin M), Srm: 32 mg/dL (ref 15–143)

## 2020-03-10 LAB — KAPPA/LAMBDA LIGHT CHAINS
Kappa free light chain: 1055 mg/L — ABNORMAL HIGH (ref 3.3–19.4)
Kappa, lambda light chain ratio: 78.73 — ABNORMAL HIGH (ref 0.26–1.65)
Lambda free light chains: 13.4 mg/L (ref 5.7–26.3)

## 2020-03-18 ENCOUNTER — Other Ambulatory Visit: Payer: Self-pay | Admitting: *Deleted

## 2020-03-18 DIAGNOSIS — C9000 Multiple myeloma not having achieved remission: Secondary | ICD-10-CM

## 2020-04-01 ENCOUNTER — Encounter
Admission: RE | Admit: 2020-04-01 | Discharge: 2020-04-01 | Disposition: A | Discharge status: Home / Self Care | Payer: Medicare Other | Source: Ambulatory Visit | Attending: Internal Medicine | Admitting: Internal Medicine

## 2020-04-04 ENCOUNTER — Other Ambulatory Visit
Admission: RE | Admit: 2020-04-04 | Discharge: 2020-04-04 | Disposition: A | Discharge status: Home / Self Care | Payer: Medicare Other | Source: Ambulatory Visit | Attending: Internal Medicine | Admitting: Internal Medicine

## 2020-04-04 DIAGNOSIS — C9000 Multiple myeloma not having achieved remission: Secondary | ICD-10-CM | POA: Insufficient documentation

## 2020-04-04 LAB — BASIC METABOLIC PANEL
Anion gap: 10 (ref 5–15)
BUN: 22 mg/dL (ref 8–23)
CO2: 28 mmol/L (ref 22–32)
Calcium: 8.5 mg/dL — ABNORMAL LOW (ref 8.9–10.3)
Chloride: 94 mmol/L — ABNORMAL LOW (ref 98–111)
Creatinine, Ser: 1.15 mg/dL (ref 0.61–1.24)
GFR calc Af Amer: >60 mL/min (ref >60)
GFR calc non Af Amer: 58 mL/min — ABNORMAL LOW (ref >60)
Glucose, Bld: 94 mg/dL (ref 70–99)
Potassium: 3.8 mmol/L (ref 3.5–5.1)
Sodium: 132 mmol/L — ABNORMAL LOW (ref 135–145)

## 2020-04-04 LAB — CBC
HCT: 25.4 % — ABNORMAL LOW (ref 39.0–52.0)
Hemoglobin: 8.1 g/dL — ABNORMAL LOW (ref 13.0–17.0)
MCH: 32.5 pg (ref 26.0–34.0)
MCHC: 31.9 g/dL (ref 30.0–36.0)
MCV: 102 fL — ABNORMAL HIGH (ref 80.0–100.0)
Platelets: 69 10*3/uL — ABNORMAL LOW (ref 150–400)
RBC: 2.49 MIL/uL — ABNORMAL LOW (ref 4.22–5.81)
RDW: 19.8 % — ABNORMAL HIGH (ref 11.5–15.5)
WBC: 3.7 10*3/uL — ABNORMAL LOW (ref 4.0–10.5)
nRBC: 0.5 % — ABNORMAL HIGH (ref 0.0–0.2)

## 2020-04-06 NOTE — Progress Notes (Signed)
[facility]  [phone] [phone]  ID: Adam Moss OB: [birth year]  MR#: [id number]  AOZ#:[id number]  Patient Care Team: Perrin Maltese, MD as PCP - General (Internal Medicine) Lloyd Huger, MD as Consulting Physician (Hematology and Oncology)   CHIEF COMPLAINT: Kappa light chain myeloma.   INTERVAL HISTORY: Patient returns to clinic today for further evaluation and discussion on whether to reinitiate Revlimid.  He continues to have chronic weakness and fatigue, but admits this is mildly improved.  He does not complain of pain today.  He continues to have issues with constipation.  He has a resting tremor secondary to his Parkinson's, but no other neurologic complaints.  He denies any recent fevers or illnesses.  He has no chest pain, shortness of breath, cough, or hemoptysis.  He denies any nausea, vomiting, or diarrhea.  He has no urinary complaints.  Patient offers no further specific complaints today.  REVIEW OF SYSTEMS:   Review of Systems  Constitutional: Positive for malaise/fatigue. Negative for fever and weight loss.  Respiratory: Negative.  Negative for cough, hemoptysis and shortness of breath.   Cardiovascular: Negative.  Negative for chest pain and leg swelling.  Gastrointestinal: Positive for constipation. Negative for abdominal pain.  Genitourinary: Negative.  Negative for dysuria.  Musculoskeletal: Negative.  Negative for back pain.  Skin: Negative.  Negative for rash.  Neurological: Positive for tremors and weakness. Negative for dizziness, focal weakness and headaches.  Psychiatric/Behavioral: Negative.  The patient is not nervous/anxious.     As per HPI. Otherwise, a complete review of systems is negative.  PAST MEDICAL HISTORY: Past Medical History:  Diagnosis Date  . Cancer ([place])    skin  . COPD (chronic obstructive pulmonary disease) ([place])   . Hyperlipemia   . Hypertension   . Lymphedema of both lower  extremities   . Parkinson disease ([place])   . Parkinsonism ([place]) 02/21/2015  . Spinal stenosis   . Tremor     PAST SURGICAL HISTORY: Past Surgical History:  Procedure Laterality Date  . APPENDECTOMY    . CATARACT EXTRACTION    . CHOLECYSTECTOMY    . TONSILLECTOMY    . VASECTOMY      FAMILY HISTORY: Family History  Problem Relation Age of Onset  . Cancer Mother   . Heart disease Father     ADVANCED DIRECTIVES (Y/N):  N  HEALTH MAINTENANCE: Social History   Tobacco Use  . Smoking status: Never Smoker  . Smokeless tobacco: Never Used  Vaping Use  . Vaping Use: Never used  Substance Use Topics  . Alcohol use: No    Alcohol/week: 0.0 standard drinks  . Drug use: No     Colonoscopy:  PAP:  Bone density:  Lipid panel:  Allergies  Allergen Reactions  . Carbidopa-Levodopa Other (See Comments)    Severe stomach pains, can take rytary  . Oxycodone-Acetaminophen Other (See Comments)    "boils on skin"  . Tyloxapol   . Acetaminophen Rash, Nausea And Vomiting and Hives  . Pramipexole Nausea Only    Current Outpatient Medications  Medication Sig Dispense Refill  . aspirin 81 MG tablet Take 81 mg by mouth daily.    . Calcium Carbonate-Vitamin D (OYSTER SHELL CALCIUM/D) 250-125 MG-UNIT TABS Take 1 tablet by mouth daily.     . Calcium Citrate 250 MG TABS Take 1 tablet by mouth daily.     . Carbidopa-Levodopa ER (RYTARY) 48.75-195 MG CPCR Take 195 mg by mouth 5 (five) times daily. [facility]  capsule 3  . cetirizine (ZYRTEC) 10 MG tablet Take 10 mg by mouth daily.    . finasteride (PROSCAR) 5 MG tablet Take 1 tablet (5 mg total) by mouth daily. 90 tablet 3  . fluticasone (FLONASE) 50 MCG/ACT nasal spray Place 1 spray into both nostrils daily as needed for allergies.     . Fluticasone-Umeclidin-Vilant (TRELEGY ELLIPTA) 100-62.5-25 MCG/INH AEPB Inhale 1 spray into the lungs daily.     . furosemide (LASIX) 20 MG tablet Take 40 mg by mouth 2 (two) times daily.     Marland Kitchen ipratropium  (ATROVENT) 0.03 % nasal spray Place 0.03 mLs into both nostrils 3 (three) times daily as needed.  11  . lactulose (CHRONULAC) 10 GM/15ML solution Take 10 g by mouth every 12 (twelve) hours as needed for mild constipation.    . niacin 500 MG tablet Take 500 mg by mouth every morning.     Marland Kitchen omeprazole (PRILOSEC) 40 MG capsule Take 40 mg by mouth daily.    . polyethylene glycol (MIRALAX / GLYCOLAX) 17 g packet Take 17 g by mouth daily.    . potassium chloride (KLOR-CON) 10 MEQ tablet Take 10 mEq by mouth 2 (two) times daily.    . rosuvastatin (CRESTOR) 20 MG tablet Take 20 mg by mouth at bedtime.    . senna-docusate (SENNA-PLUS) 8.6-50 MG tablet Take 2 tablets by mouth 2 (two) times daily as needed for mild constipation.    . sertraline (ZOLOFT) 25 MG tablet Take 25 mg by mouth at bedtime.     Marland Kitchen spironolactone (ALDACTONE) 25 MG tablet Take 25 mg by mouth daily.     . tamsulosin (FLOMAX) 0.4 MG CAPS capsule Take 1 capsule (0.4 mg total) by mouth daily. 90 capsule 3  . vitamin B-12 (CYANOCOBALAMIN) 1000 MCG tablet Take 1,000 mcg by mouth daily.    . prochlorperazine (COMPAZINE) 10 MG tablet Take 1 tablet (10 mg total) by mouth every 6 (six) hours as needed for nausea or vomiting. (Patient not taking: Reported on 02/20/2020) 30 tablet 0   No current facility-administered medications for this visit.    OBJECTIVE: Vitals:   04/11/20 0954  BP: 99/78  Pulse: 84  Temp: 98 F (36.7 C)  SpO2: 100%     Body mass index is 21.38 kg/m.    ECOG FS:2 - Symptomatic, <50% confined to bed  General: Well-developed, well-nourished, no acute distress.  Sitting in a wheelchair. Eyes: Pink conjunctiva, anicteric sclera. HEENT: Normocephalic, moist mucous membranes. Lungs: No audible wheezing or coughing. Heart: Regular rate and rhythm. Abdomen: Soft, nontender, no obvious distention. Musculoskeletal: No edema, cyanosis, or clubbing. Neuro: Alert, answering all questions appropriately. Cranial nerves grossly  intact.  Tremor noted. Skin: No rashes or petechiae noted. Psych: Normal affect.   LAB RESULTS:  Lab Results  Component Value Date   NA 132 (L) 04/04/2020   K 3.8 04/04/2020   CL 94 (L) 04/04/2020   CO2 28 04/04/2020   GLUCOSE 94 04/04/2020   BUN 22 04/04/2020   CREATININE 1.15 04/04/2020   CALCIUM 8.5 (L) 04/04/2020   PROT 6.3 (L) 03/07/2020   ALBUMIN 3.3 (L) 03/07/2020   AST 22 03/07/2020   ALT 5 03/07/2020   ALKPHOS 127 (H) 03/07/2020   BILITOT 1.2 03/07/2020   GFRNONAA 58 (L) 04/04/2020   GFRAA >60 04/04/2020    Lab Results  Component Value Date   WBC 3.7 (L) 04/04/2020   NEUTROABS 1.9 03/07/2020   HGB 8.1 (L) 04/04/2020   HCT  25.4 (L) 04/04/2020   MCV 102.0 (H) 04/04/2020   PLT 69 (L) 04/04/2020     STUDIES: No results found.  ASSESSMENT: Kappa light chain myeloma.  PLAN:    1. Kappa light chain myeloma: Bone marrow biopsy on May 01, 2019 revealed increased plasma cells of 30 to 40% with kappa light chain restriction.  SPEP is negative and IgG, IgA, and IgM are within normal limits. Kappa free light chains are stable ranging from 805.8 to 950.4.  Despite Revlimid being on hold for approximately 6 to 8 weeks, patient's kappa free light chains remain stable at 983.9.  Continue to hold Revlimid at this time.  Patient is undecided whether he wishes to enroll in hospice for reattempt treatment in the future if needed.  Continue to hold Zometa as well.  Return to clinic in 2 months with repeat laboratory work and continued discussion regarding treatment versus hospice.  Appreciate palliative care input.   2.  Right clavicle fracture: Pathologic.  Resolved.  Patient reports no further interventions are needed by orthopedics. 3.  Hypocalcemia: Chronic and unchanged.  Patient's calcium is 8.5 today. 4.  Iron deficiency anemia: Hemoglobin has trended down to 8.1.  Monitor. 5.  Thrombocytopenia: Platelet count remains decreased at 69.  Continue to hold Revlimid as  above. 6.  Leukopenia: Mildly improved. 7.  Constipation: Recommended OTC Magnesium citrate.  MiraLAX as needed. 8.  Hypokalemia: Resolved.  Continue oral supplementation.  9.  Dental pain: Patient reports no interventions by his dentist currently.  Continue to hold Zometa.  Patient expressed understanding and was in agreement with this plan. He also understands that He can call clinic at any time with any questions, concerns, or complaints.    Lloyd Huger, MD   04/12/2020 8:31 AM

## 2020-04-07 DIAGNOSIS — S22020D Wedge compression fracture of second thoracic vertebra, subsequent encounter for fracture with routine healing: Secondary | ICD-10-CM | POA: Diagnosis not present

## 2020-04-07 DIAGNOSIS — Z87440 Personal history of urinary (tract) infections: Secondary | ICD-10-CM | POA: Diagnosis not present

## 2020-04-07 DIAGNOSIS — G20 Parkinson's disease: Secondary | ICD-10-CM | POA: Diagnosis not present

## 2020-04-07 DIAGNOSIS — C9000 Multiple myeloma not having achieved remission: Secondary | ICD-10-CM | POA: Diagnosis not present

## 2020-04-07 DIAGNOSIS — Z9181 History of falling: Secondary | ICD-10-CM | POA: Diagnosis not present

## 2020-04-07 DIAGNOSIS — M6281 Muscle weakness (generalized): Secondary | ICD-10-CM | POA: Diagnosis not present

## 2020-04-07 DIAGNOSIS — J410 Simple chronic bronchitis: Secondary | ICD-10-CM | POA: Diagnosis not present

## 2020-04-07 DIAGNOSIS — R2689 Other abnormalities of gait and mobility: Secondary | ICD-10-CM | POA: Diagnosis not present

## 2020-04-07 DIAGNOSIS — R279 Unspecified lack of coordination: Secondary | ICD-10-CM | POA: Diagnosis not present

## 2020-04-07 DIAGNOSIS — N400 Enlarged prostate without lower urinary tract symptoms: Secondary | ICD-10-CM | POA: Diagnosis not present

## 2020-04-07 DIAGNOSIS — R262 Difficulty in walking, not elsewhere classified: Secondary | ICD-10-CM | POA: Diagnosis not present

## 2020-04-07 LAB — KAPPA/LAMBDA LIGHT CHAINS
Kappa free light chain: 983.9 mg/L — ABNORMAL HIGH (ref 3.3–19.4)
Kappa, lambda light chain ratio: 153.73 — ABNORMAL HIGH (ref 0.26–1.65)
Lambda free light chains: 6.4 mg/L (ref 5.7–26.3)

## 2020-04-09 DIAGNOSIS — S22020D Wedge compression fracture of second thoracic vertebra, subsequent encounter for fracture with routine healing: Secondary | ICD-10-CM | POA: Diagnosis not present

## 2020-04-09 DIAGNOSIS — M6281 Muscle weakness (generalized): Secondary | ICD-10-CM | POA: Diagnosis not present

## 2020-04-09 DIAGNOSIS — R2689 Other abnormalities of gait and mobility: Secondary | ICD-10-CM | POA: Diagnosis not present

## 2020-04-09 DIAGNOSIS — Z9181 History of falling: Secondary | ICD-10-CM | POA: Diagnosis not present

## 2020-04-09 DIAGNOSIS — R262 Difficulty in walking, not elsewhere classified: Secondary | ICD-10-CM | POA: Diagnosis not present

## 2020-04-09 DIAGNOSIS — R279 Unspecified lack of coordination: Secondary | ICD-10-CM | POA: Diagnosis not present

## 2020-04-10 DIAGNOSIS — R279 Unspecified lack of coordination: Secondary | ICD-10-CM | POA: Diagnosis not present

## 2020-04-10 DIAGNOSIS — S22020D Wedge compression fracture of second thoracic vertebra, subsequent encounter for fracture with routine healing: Secondary | ICD-10-CM | POA: Diagnosis not present

## 2020-04-10 DIAGNOSIS — Z9181 History of falling: Secondary | ICD-10-CM | POA: Diagnosis not present

## 2020-04-10 DIAGNOSIS — R262 Difficulty in walking, not elsewhere classified: Secondary | ICD-10-CM | POA: Diagnosis not present

## 2020-04-10 DIAGNOSIS — R2689 Other abnormalities of gait and mobility: Secondary | ICD-10-CM | POA: Diagnosis not present

## 2020-04-10 DIAGNOSIS — M6281 Muscle weakness (generalized): Secondary | ICD-10-CM | POA: Diagnosis not present

## 2020-04-11 ENCOUNTER — Inpatient Hospital Stay: Payer: Medicare Other | Attending: Oncology | Admitting: Oncology

## 2020-04-11 ENCOUNTER — Inpatient Hospital Stay (HOSPITAL_BASED_OUTPATIENT_CLINIC_OR_DEPARTMENT_OTHER): Payer: Medicare Other | Admitting: Hospice and Palliative Medicine

## 2020-04-11 VITALS — BP 99/78 | HR 84 | Temp 98.0°F | Wt 149.0 lb

## 2020-04-11 DIAGNOSIS — Z79899 Other long term (current) drug therapy: Secondary | ICD-10-CM | POA: Insufficient documentation

## 2020-04-11 DIAGNOSIS — I872 Venous insufficiency (chronic) (peripheral): Secondary | ICD-10-CM | POA: Insufficient documentation

## 2020-04-11 DIAGNOSIS — C9000 Multiple myeloma not having achieved remission: Secondary | ICD-10-CM | POA: Diagnosis not present

## 2020-04-11 DIAGNOSIS — Z66 Do not resuscitate: Secondary | ICD-10-CM | POA: Diagnosis not present

## 2020-04-11 DIAGNOSIS — F419 Anxiety disorder, unspecified: Secondary | ICD-10-CM | POA: Diagnosis not present

## 2020-04-11 DIAGNOSIS — E785 Hyperlipidemia, unspecified: Secondary | ICD-10-CM | POA: Diagnosis not present

## 2020-04-11 DIAGNOSIS — D72819 Decreased white blood cell count, unspecified: Secondary | ICD-10-CM | POA: Diagnosis not present

## 2020-04-11 DIAGNOSIS — F329 Major depressive disorder, single episode, unspecified: Secondary | ICD-10-CM | POA: Insufficient documentation

## 2020-04-11 DIAGNOSIS — J449 Chronic obstructive pulmonary disease, unspecified: Secondary | ICD-10-CM | POA: Insufficient documentation

## 2020-04-11 DIAGNOSIS — I890 Lymphedema, not elsewhere classified: Secondary | ICD-10-CM | POA: Diagnosis not present

## 2020-04-11 DIAGNOSIS — K0889 Other specified disorders of teeth and supporting structures: Secondary | ICD-10-CM | POA: Diagnosis not present

## 2020-04-11 DIAGNOSIS — Z515 Encounter for palliative care: Secondary | ICD-10-CM

## 2020-04-11 DIAGNOSIS — D696 Thrombocytopenia, unspecified: Secondary | ICD-10-CM | POA: Diagnosis not present

## 2020-04-11 DIAGNOSIS — M4800 Spinal stenosis, site unspecified: Secondary | ICD-10-CM | POA: Insufficient documentation

## 2020-04-11 DIAGNOSIS — R531 Weakness: Secondary | ICD-10-CM | POA: Diagnosis not present

## 2020-04-11 DIAGNOSIS — K5900 Constipation, unspecified: Secondary | ICD-10-CM | POA: Diagnosis not present

## 2020-04-11 DIAGNOSIS — G20 Parkinson's disease: Secondary | ICD-10-CM | POA: Insufficient documentation

## 2020-04-11 DIAGNOSIS — I10 Essential (primary) hypertension: Secondary | ICD-10-CM | POA: Insufficient documentation

## 2020-04-11 DIAGNOSIS — D509 Iron deficiency anemia, unspecified: Secondary | ICD-10-CM | POA: Insufficient documentation

## 2020-04-11 NOTE — Progress Notes (Signed)
[facility]  Telephone:([phone] [phone]   Name: Adam Moss Date: 04/11/2020 MRN: [id number]  DOB: 1933/12/12  Patient Care Team: Perrin Maltese, MD as PCP - General (Internal Medicine) Lloyd Huger, MD as Consulting Physician (Hematology and Oncology)    REASON FOR CONSULTATION: Adam Moss is a 86 y.o. male with multiple medical problems including multiple myeloma, Parkinson's disease, COPD, venous insufficiency with lower extremity edema, hypertension, and hyperlipidemia.  Patient was hospitalized 02/20/2020-02/25/2020 with weakness after mechanical fall at home.  Patient was found to have a thoracic compression fracture.  He was sent to rehab.  Palliative care was consulted help address goals and manage ongoing symptoms.  SOCIAL HISTORY:     reports that he has never smoked. He has never used smokeless tobacco. He reports that he does not drink alcohol and does not use drugs.   Patient is a widower.  He lives at the [facility].  He has a son and daughter who are involved in his care.  Patient has another son who is now deceased.  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  ADVANCE DIRECTIVES:  Not on file  CODE STATUS: DNR  PAST MEDICAL HISTORY: Past Medical History:  Diagnosis Date  . Cancer ([place])    skin  . COPD (chronic obstructive pulmonary disease) ([place])   . Hyperlipemia   . Hypertension   . Lymphedema of both lower extremities   . Parkinson disease ([place])   . Parkinsonism ([place]) 02/21/2015  . Spinal stenosis   . Tremor     PAST SURGICAL HISTORY:  Past Surgical History:  Procedure Laterality Date  . APPENDECTOMY    . CATARACT EXTRACTION    . CHOLECYSTECTOMY    . TONSILLECTOMY    . VASECTOMY      HEMATOLOGY/ONCOLOGY HISTORY:  Oncology History  Multiple myeloma ([name])    05/07/2019 Initial Diagnosis   Multiple myeloma ([facility])     ALLERGIES:  is allergic to carbidopa-levodopa, oxycodone-acetaminophen, tyloxapol, acetaminophen, and pramipexole.  MEDICATIONS:  Current Outpatient Medications  Medication Sig Dispense Refill  . aspirin 81 MG tablet Take 81 mg by mouth daily.    . Calcium Carbonate-Vitamin D (OYSTER SHELL CALCIUM/D) 250-125 MG-UNIT TABS Take 1 tablet by mouth daily.     . Calcium Citrate 250 MG TABS Take 1 tablet by mouth daily.     . Carbidopa-Levodopa ER (RYTARY) 48.75-195 MG CPCR Take 195 mg by mouth 5 (five) times daily. 450 capsule 3  . cetirizine (ZYRTEC) 10 MG tablet Take 10 mg by mouth daily.    . finasteride (PROSCAR) 5 MG tablet Take 1 tablet (5 mg total) by mouth daily. 90 tablet 3  . fluticasone (FLONASE) 50 MCG/ACT nasal spray Place 1 spray into both nostrils daily as needed for allergies.     . Fluticasone-Umeclidin-Vilant (TRELEGY ELLIPTA) 100-62.5-25 MCG/INH AEPB Inhale 1 spray into the lungs daily.     . furosemide (LASIX) 20 MG tablet Take 40 mg by mouth 2 (two) times daily.     Marland Kitchen ipratropium (ATROVENT) 0.03 % nasal spray Place 0.03 mLs into both nostrils 3 (three) times daily as needed.  11  . lactulose (CHRONULAC) 10 GM/15ML solution Take 10 g by mouth every 12 (twelve) hours as needed for mild constipation.    . niacin 500 MG tablet Take 500 mg by mouth every morning.     Marland Kitchen  omeprazole (PRILOSEC) 40 MG capsule Take 40 mg by mouth daily.    . polyethylene glycol (MIRALAX / GLYCOLAX) 17 g packet Take 17 g by mouth daily.    . potassium chloride (KLOR-CON) 10 MEQ tablet Take 10 mEq by mouth 2 (two) times daily.    . prochlorperazine (COMPAZINE) 10 MG tablet Take 1 tablet (10 mg total) by mouth every 6 (six) hours as needed for nausea or vomiting. (Patient not taking: Reported on 02/20/2020) 30 tablet 0  . rosuvastatin (CRESTOR) 20 MG tablet Take 20 mg by mouth at bedtime.    . senna-docusate (SENNA-PLUS) 8.6-50 MG tablet Take 2  tablets by mouth 2 (two) times daily as needed for mild constipation.    . sertraline (ZOLOFT) 25 MG tablet Take 25 mg by mouth at bedtime.     Marland Kitchen spironolactone (ALDACTONE) 25 MG tablet Take 25 mg by mouth daily.     . tamsulosin (FLOMAX) 0.4 MG CAPS capsule Take 1 capsule (0.4 mg total) by mouth daily. 90 capsule 3  . vitamin B-12 (CYANOCOBALAMIN) 1000 MCG tablet Take 1,000 mcg by mouth daily.     No current facility-administered medications for this visit.    VITAL SIGNS: There were no vitals taken for this visit. There were no vitals filed for this visit.  Estimated body mass index is 21.38 kg/m as calculated from the following:   Height as of 03/07/20: 5' 10" (1.778 m).   Weight as of an earlier encounter on 04/11/20: 149 lb (67.6 kg).  LABS: CBC:    Component Value Date/Time   WBC 3.7 (L) 04/04/2020 0540   HGB 8.1 (L) 04/04/2020 0540   HGB 15.8 10/27/2014 0017   HCT 25.4 (L) 04/04/2020 0540   HCT 46.4 10/27/2014 0017   PLT 69 (L) 04/04/2020 0540   PLT 179 10/27/2014 0017   MCV 102.0 (H) 04/04/2020 0540   MCV 90 10/27/2014 0017   NEUTROABS 1.9 03/07/2020 0916   LYMPHSABS 1.1 03/07/2020 0916   MONOABS 0.3 03/07/2020 0916   EOSABS 0.0 03/07/2020 0916   BASOSABS 0.0 03/07/2020 0916   Comprehensive Metabolic Panel:    Component Value Date/Time   NA 132 (L) 04/04/2020 0540   NA 139 10/27/2014 0017   K 3.8 04/04/2020 0540   K 3.5 10/27/2014 0017   CL 94 (L) 04/04/2020 0540   CL 103 10/27/2014 0017   CO2 28 04/04/2020 0540   CO2 28 10/27/2014 0017   BUN 22 04/04/2020 0540   BUN 13 10/27/2014 0017   CREATININE 1.15 04/04/2020 0540   CREATININE 0.96 10/27/2014 0017   GLUCOSE 94 04/04/2020 0540   GLUCOSE 111 (H) 10/27/2014 0017   CALCIUM 8.5 (L) 04/04/2020 0540   CALCIUM 9.2 10/27/2014 0017   AST 22 03/07/2020 0916   ALT 5 03/07/2020 0916   ALKPHOS 127 (H) 03/07/2020 0916   BILITOT 1.2 03/07/2020 0916   PROT 6.3 (L) 03/07/2020 0916   ALBUMIN 3.3 (L) 03/07/2020 0916     RADIOGRAPHIC STUDIES: No results found.  PERFORMANCE STATUS (ECOG) : 2 - Symptomatic, <50% confined to bed  Review of Systems Unless otherwise noted, a complete review of systems is negative.  Physical Exam General: NAD, frail appearing Pulmonary: unlabored Extremities: + LE edema, no joint deformities Skin: no rashes Neurological: Weakness but otherwise nonfocal  IMPRESSION: I met with patient and his son today in the clinic for routine follow-up.  Patient saw Dr. Grayland Ormond earlier today.  Plan is to continue holding treatment with follow-up evaluation  in 2 months to readdress goals.  In the event that patient does not pursue treatment, could consider hospice involvement.  Symptomatically, patient reports he is doing reasonably well.  He does endorse some anxiety and depression associated with a change in his long-term plan.  He is no longer going to be able to transition to his apartment and likely will need to remain in SNF.  Discussed option of antidepressant/anxiolytic but patient opted not to pursue this.  Says he has tried antidepressant in the past and stopped taking it.  He does also endorse insomnia.  He has difficulty initiating sleep at night but reports frequent daytime napping.  Discussed sleep hygiene.  Constipation is improved.  Discussed bowel regimen.  PLAN: -Continue current scope of treatment -Palliative care to follow at SNF -Patient considering treatment versus supportive care -DNR/DNI -RTC in 2 months, MyChart visit in 1 month  Case and plan discussed with Dr. Grayland Ormond  Patient expressed understanding and was in agreement with this plan. He also understands that He can call the clinic at any time with any questions, concerns, or complaints.     Time Total: 15 minutes  Visit consisted of counseling and education dealing with the complex and emotionally intense issues of symptom management and palliative care in the setting of serious and potentially  life-threatening illness.Greater than 50%  of this time was spent counseling and coordinating care related to the above assessment and plan.  Signed by: Altha Harm, PhD, NP-C

## 2020-04-11 NOTE — Progress Notes (Signed)
Patient denies any pain today. Would like to discuss next steps and options for him, since stopping treatment.

## 2020-04-14 DIAGNOSIS — Z9181 History of falling: Secondary | ICD-10-CM | POA: Diagnosis not present

## 2020-04-14 DIAGNOSIS — R279 Unspecified lack of coordination: Secondary | ICD-10-CM | POA: Diagnosis not present

## 2020-04-14 DIAGNOSIS — M6281 Muscle weakness (generalized): Secondary | ICD-10-CM | POA: Diagnosis not present

## 2020-04-14 DIAGNOSIS — S22020D Wedge compression fracture of second thoracic vertebra, subsequent encounter for fracture with routine healing: Secondary | ICD-10-CM | POA: Diagnosis not present

## 2020-04-14 DIAGNOSIS — R2689 Other abnormalities of gait and mobility: Secondary | ICD-10-CM | POA: Diagnosis not present

## 2020-04-14 DIAGNOSIS — R262 Difficulty in walking, not elsewhere classified: Secondary | ICD-10-CM | POA: Diagnosis not present

## 2020-04-15 ENCOUNTER — Ambulatory Visit: Payer: Medicare Other | Admitting: Neurology

## 2020-04-15 DIAGNOSIS — S22020D Wedge compression fracture of second thoracic vertebra, subsequent encounter for fracture with routine healing: Secondary | ICD-10-CM | POA: Diagnosis not present

## 2020-04-15 DIAGNOSIS — R279 Unspecified lack of coordination: Secondary | ICD-10-CM | POA: Diagnosis not present

## 2020-04-15 DIAGNOSIS — Z9181 History of falling: Secondary | ICD-10-CM | POA: Diagnosis not present

## 2020-04-15 DIAGNOSIS — M6281 Muscle weakness (generalized): Secondary | ICD-10-CM | POA: Diagnosis not present

## 2020-04-15 DIAGNOSIS — R262 Difficulty in walking, not elsewhere classified: Secondary | ICD-10-CM | POA: Diagnosis not present

## 2020-04-15 DIAGNOSIS — R2689 Other abnormalities of gait and mobility: Secondary | ICD-10-CM | POA: Diagnosis not present

## 2020-04-16 DIAGNOSIS — R279 Unspecified lack of coordination: Secondary | ICD-10-CM | POA: Diagnosis not present

## 2020-04-16 DIAGNOSIS — R2689 Other abnormalities of gait and mobility: Secondary | ICD-10-CM | POA: Diagnosis not present

## 2020-04-16 DIAGNOSIS — R262 Difficulty in walking, not elsewhere classified: Secondary | ICD-10-CM | POA: Diagnosis not present

## 2020-04-16 DIAGNOSIS — S22020D Wedge compression fracture of second thoracic vertebra, subsequent encounter for fracture with routine healing: Secondary | ICD-10-CM | POA: Diagnosis not present

## 2020-04-16 DIAGNOSIS — Z9181 History of falling: Secondary | ICD-10-CM | POA: Diagnosis not present

## 2020-04-16 DIAGNOSIS — M6281 Muscle weakness (generalized): Secondary | ICD-10-CM | POA: Diagnosis not present

## 2020-04-17 DIAGNOSIS — Z9181 History of falling: Secondary | ICD-10-CM | POA: Diagnosis not present

## 2020-04-17 DIAGNOSIS — R2689 Other abnormalities of gait and mobility: Secondary | ICD-10-CM | POA: Diagnosis not present

## 2020-04-17 DIAGNOSIS — M6281 Muscle weakness (generalized): Secondary | ICD-10-CM | POA: Diagnosis not present

## 2020-04-17 DIAGNOSIS — R279 Unspecified lack of coordination: Secondary | ICD-10-CM | POA: Diagnosis not present

## 2020-04-17 DIAGNOSIS — S22020D Wedge compression fracture of second thoracic vertebra, subsequent encounter for fracture with routine healing: Secondary | ICD-10-CM | POA: Diagnosis not present

## 2020-04-17 DIAGNOSIS — R262 Difficulty in walking, not elsewhere classified: Secondary | ICD-10-CM | POA: Diagnosis not present

## 2020-04-18 DIAGNOSIS — R262 Difficulty in walking, not elsewhere classified: Secondary | ICD-10-CM | POA: Diagnosis not present

## 2020-04-18 DIAGNOSIS — M6281 Muscle weakness (generalized): Secondary | ICD-10-CM | POA: Diagnosis not present

## 2020-04-18 DIAGNOSIS — Z9181 History of falling: Secondary | ICD-10-CM | POA: Diagnosis not present

## 2020-04-18 DIAGNOSIS — S22020D Wedge compression fracture of second thoracic vertebra, subsequent encounter for fracture with routine healing: Secondary | ICD-10-CM | POA: Diagnosis not present

## 2020-04-18 DIAGNOSIS — R279 Unspecified lack of coordination: Secondary | ICD-10-CM | POA: Diagnosis not present

## 2020-04-18 DIAGNOSIS — R2689 Other abnormalities of gait and mobility: Secondary | ICD-10-CM | POA: Diagnosis not present

## 2020-04-21 DIAGNOSIS — R279 Unspecified lack of coordination: Secondary | ICD-10-CM | POA: Diagnosis not present

## 2020-04-21 DIAGNOSIS — M6281 Muscle weakness (generalized): Secondary | ICD-10-CM | POA: Diagnosis not present

## 2020-04-21 DIAGNOSIS — S22020D Wedge compression fracture of second thoracic vertebra, subsequent encounter for fracture with routine healing: Secondary | ICD-10-CM | POA: Diagnosis not present

## 2020-04-21 DIAGNOSIS — R2689 Other abnormalities of gait and mobility: Secondary | ICD-10-CM | POA: Diagnosis not present

## 2020-04-21 DIAGNOSIS — R262 Difficulty in walking, not elsewhere classified: Secondary | ICD-10-CM | POA: Diagnosis not present

## 2020-04-21 DIAGNOSIS — Z9181 History of falling: Secondary | ICD-10-CM | POA: Diagnosis not present

## 2020-04-22 DIAGNOSIS — R279 Unspecified lack of coordination: Secondary | ICD-10-CM | POA: Diagnosis not present

## 2020-04-22 DIAGNOSIS — S22020D Wedge compression fracture of second thoracic vertebra, subsequent encounter for fracture with routine healing: Secondary | ICD-10-CM | POA: Diagnosis not present

## 2020-04-22 DIAGNOSIS — R262 Difficulty in walking, not elsewhere classified: Secondary | ICD-10-CM | POA: Diagnosis not present

## 2020-04-22 DIAGNOSIS — R2689 Other abnormalities of gait and mobility: Secondary | ICD-10-CM | POA: Diagnosis not present

## 2020-04-22 DIAGNOSIS — Z9181 History of falling: Secondary | ICD-10-CM | POA: Diagnosis not present

## 2020-04-22 DIAGNOSIS — M6281 Muscle weakness (generalized): Secondary | ICD-10-CM | POA: Diagnosis not present

## 2020-04-23 DIAGNOSIS — Z789 Other specified health status: Secondary | ICD-10-CM | POA: Diagnosis not present

## 2020-04-23 DIAGNOSIS — J410 Simple chronic bronchitis: Secondary | ICD-10-CM | POA: Diagnosis not present

## 2020-04-23 DIAGNOSIS — R601 Generalized edema: Secondary | ICD-10-CM | POA: Diagnosis not present

## 2020-04-23 DIAGNOSIS — G20 Parkinson's disease: Secondary | ICD-10-CM | POA: Diagnosis not present

## 2020-04-23 DIAGNOSIS — C9000 Multiple myeloma not having achieved remission: Secondary | ICD-10-CM | POA: Diagnosis not present

## 2020-04-24 DIAGNOSIS — M6281 Muscle weakness (generalized): Secondary | ICD-10-CM | POA: Diagnosis not present

## 2020-04-24 DIAGNOSIS — S22020D Wedge compression fracture of second thoracic vertebra, subsequent encounter for fracture with routine healing: Secondary | ICD-10-CM | POA: Diagnosis not present

## 2020-04-24 DIAGNOSIS — R279 Unspecified lack of coordination: Secondary | ICD-10-CM | POA: Diagnosis not present

## 2020-04-24 DIAGNOSIS — R2689 Other abnormalities of gait and mobility: Secondary | ICD-10-CM | POA: Diagnosis not present

## 2020-04-24 DIAGNOSIS — Z9181 History of falling: Secondary | ICD-10-CM | POA: Diagnosis not present

## 2020-04-24 DIAGNOSIS — R262 Difficulty in walking, not elsewhere classified: Secondary | ICD-10-CM | POA: Diagnosis not present

## 2020-04-28 DIAGNOSIS — S22020D Wedge compression fracture of second thoracic vertebra, subsequent encounter for fracture with routine healing: Secondary | ICD-10-CM | POA: Diagnosis not present

## 2020-04-28 DIAGNOSIS — S22080D Wedge compression fracture of T11-T12 vertebra, subsequent encounter for fracture with routine healing: Secondary | ICD-10-CM | POA: Diagnosis not present

## 2020-04-28 DIAGNOSIS — I872 Venous insufficiency (chronic) (peripheral): Secondary | ICD-10-CM | POA: Diagnosis not present

## 2020-04-28 DIAGNOSIS — N400 Enlarged prostate without lower urinary tract symptoms: Secondary | ICD-10-CM | POA: Diagnosis not present

## 2020-04-28 DIAGNOSIS — J410 Simple chronic bronchitis: Secondary | ICD-10-CM | POA: Diagnosis not present

## 2020-04-28 DIAGNOSIS — M6281 Muscle weakness (generalized): Secondary | ICD-10-CM | POA: Diagnosis not present

## 2020-04-28 DIAGNOSIS — Z87440 Personal history of urinary (tract) infections: Secondary | ICD-10-CM | POA: Diagnosis not present

## 2020-04-28 DIAGNOSIS — C9000 Multiple myeloma not having achieved remission: Secondary | ICD-10-CM | POA: Diagnosis not present

## 2020-04-28 DIAGNOSIS — E785 Hyperlipidemia, unspecified: Secondary | ICD-10-CM | POA: Diagnosis not present

## 2020-04-28 DIAGNOSIS — Z20828 Contact with and (suspected) exposure to other viral communicable diseases: Secondary | ICD-10-CM | POA: Diagnosis not present

## 2020-04-28 DIAGNOSIS — R2689 Other abnormalities of gait and mobility: Secondary | ICD-10-CM | POA: Diagnosis not present

## 2020-04-28 DIAGNOSIS — Z9181 History of falling: Secondary | ICD-10-CM | POA: Diagnosis not present

## 2020-04-28 DIAGNOSIS — R279 Unspecified lack of coordination: Secondary | ICD-10-CM | POA: Diagnosis not present

## 2020-04-28 DIAGNOSIS — G20 Parkinson's disease: Secondary | ICD-10-CM | POA: Diagnosis not present

## 2020-04-29 DIAGNOSIS — S22080D Wedge compression fracture of T11-T12 vertebra, subsequent encounter for fracture with routine healing: Secondary | ICD-10-CM | POA: Diagnosis not present

## 2020-04-29 DIAGNOSIS — C9000 Multiple myeloma not having achieved remission: Secondary | ICD-10-CM | POA: Diagnosis not present

## 2020-04-29 DIAGNOSIS — M6281 Muscle weakness (generalized): Secondary | ICD-10-CM | POA: Diagnosis not present

## 2020-04-29 DIAGNOSIS — S22020D Wedge compression fracture of second thoracic vertebra, subsequent encounter for fracture with routine healing: Secondary | ICD-10-CM | POA: Diagnosis not present

## 2020-04-29 DIAGNOSIS — R2689 Other abnormalities of gait and mobility: Secondary | ICD-10-CM | POA: Diagnosis not present

## 2020-04-29 DIAGNOSIS — G20 Parkinson's disease: Secondary | ICD-10-CM | POA: Diagnosis not present

## 2020-04-30 DIAGNOSIS — C9000 Multiple myeloma not having achieved remission: Secondary | ICD-10-CM | POA: Diagnosis not present

## 2020-04-30 DIAGNOSIS — R2689 Other abnormalities of gait and mobility: Secondary | ICD-10-CM | POA: Diagnosis not present

## 2020-04-30 DIAGNOSIS — S22020D Wedge compression fracture of second thoracic vertebra, subsequent encounter for fracture with routine healing: Secondary | ICD-10-CM | POA: Diagnosis not present

## 2020-04-30 DIAGNOSIS — G20 Parkinson's disease: Secondary | ICD-10-CM | POA: Diagnosis not present

## 2020-04-30 DIAGNOSIS — M6281 Muscle weakness (generalized): Secondary | ICD-10-CM | POA: Diagnosis not present

## 2020-04-30 DIAGNOSIS — S22080D Wedge compression fracture of T11-T12 vertebra, subsequent encounter for fracture with routine healing: Secondary | ICD-10-CM | POA: Diagnosis not present

## 2020-05-01 DIAGNOSIS — R2689 Other abnormalities of gait and mobility: Secondary | ICD-10-CM | POA: Diagnosis not present

## 2020-05-01 DIAGNOSIS — S22080D Wedge compression fracture of T11-T12 vertebra, subsequent encounter for fracture with routine healing: Secondary | ICD-10-CM | POA: Diagnosis not present

## 2020-05-01 DIAGNOSIS — G20 Parkinson's disease: Secondary | ICD-10-CM | POA: Diagnosis not present

## 2020-05-01 DIAGNOSIS — C9000 Multiple myeloma not having achieved remission: Secondary | ICD-10-CM | POA: Diagnosis not present

## 2020-05-01 DIAGNOSIS — S22020D Wedge compression fracture of second thoracic vertebra, subsequent encounter for fracture with routine healing: Secondary | ICD-10-CM | POA: Diagnosis not present

## 2020-05-01 DIAGNOSIS — M6281 Muscle weakness (generalized): Secondary | ICD-10-CM | POA: Diagnosis not present

## 2020-05-02 DIAGNOSIS — G20 Parkinson's disease: Secondary | ICD-10-CM | POA: Diagnosis not present

## 2020-05-02 DIAGNOSIS — R2689 Other abnormalities of gait and mobility: Secondary | ICD-10-CM | POA: Diagnosis not present

## 2020-05-02 DIAGNOSIS — S22020D Wedge compression fracture of second thoracic vertebra, subsequent encounter for fracture with routine healing: Secondary | ICD-10-CM | POA: Diagnosis not present

## 2020-05-02 DIAGNOSIS — S22080D Wedge compression fracture of T11-T12 vertebra, subsequent encounter for fracture with routine healing: Secondary | ICD-10-CM | POA: Diagnosis not present

## 2020-05-02 DIAGNOSIS — C9000 Multiple myeloma not having achieved remission: Secondary | ICD-10-CM | POA: Diagnosis not present

## 2020-05-02 DIAGNOSIS — M6281 Muscle weakness (generalized): Secondary | ICD-10-CM | POA: Diagnosis not present

## 2020-05-05 DIAGNOSIS — S22080D Wedge compression fracture of T11-T12 vertebra, subsequent encounter for fracture with routine healing: Secondary | ICD-10-CM | POA: Diagnosis not present

## 2020-05-05 DIAGNOSIS — G20 Parkinson's disease: Secondary | ICD-10-CM | POA: Diagnosis not present

## 2020-05-05 DIAGNOSIS — Z20828 Contact with and (suspected) exposure to other viral communicable diseases: Secondary | ICD-10-CM | POA: Diagnosis not present

## 2020-05-05 DIAGNOSIS — C9000 Multiple myeloma not having achieved remission: Secondary | ICD-10-CM | POA: Diagnosis not present

## 2020-05-05 DIAGNOSIS — S22020D Wedge compression fracture of second thoracic vertebra, subsequent encounter for fracture with routine healing: Secondary | ICD-10-CM | POA: Diagnosis not present

## 2020-05-05 DIAGNOSIS — M6281 Muscle weakness (generalized): Secondary | ICD-10-CM | POA: Diagnosis not present

## 2020-05-05 DIAGNOSIS — R2689 Other abnormalities of gait and mobility: Secondary | ICD-10-CM | POA: Diagnosis not present

## 2020-05-06 DIAGNOSIS — R2689 Other abnormalities of gait and mobility: Secondary | ICD-10-CM | POA: Diagnosis not present

## 2020-05-06 DIAGNOSIS — S22020D Wedge compression fracture of second thoracic vertebra, subsequent encounter for fracture with routine healing: Secondary | ICD-10-CM | POA: Diagnosis not present

## 2020-05-06 DIAGNOSIS — G20 Parkinson's disease: Secondary | ICD-10-CM | POA: Diagnosis not present

## 2020-05-06 DIAGNOSIS — C9000 Multiple myeloma not having achieved remission: Secondary | ICD-10-CM | POA: Diagnosis not present

## 2020-05-06 DIAGNOSIS — S22080D Wedge compression fracture of T11-T12 vertebra, subsequent encounter for fracture with routine healing: Secondary | ICD-10-CM | POA: Diagnosis not present

## 2020-05-06 DIAGNOSIS — M6281 Muscle weakness (generalized): Secondary | ICD-10-CM | POA: Diagnosis not present

## 2020-05-07 ENCOUNTER — Non-Acute Institutional Stay: Payer: Medicare Other | Admitting: Adult Health Nurse Practitioner

## 2020-05-07 ENCOUNTER — Encounter: Payer: Self-pay | Admitting: Neurology

## 2020-05-07 ENCOUNTER — Ambulatory Visit (INDEPENDENT_AMBULATORY_CARE_PROVIDER_SITE_OTHER): Payer: Medicare Other | Admitting: Neurology

## 2020-05-07 ENCOUNTER — Other Ambulatory Visit: Payer: Self-pay

## 2020-05-07 VITALS — BP 116/71 | HR 83 | Wt 153.6 lb

## 2020-05-07 DIAGNOSIS — S22000A Wedge compression fracture of unspecified thoracic vertebra, initial encounter for closed fracture: Secondary | ICD-10-CM | POA: Diagnosis not present

## 2020-05-07 DIAGNOSIS — G20A1 Parkinson's disease without dyskinesia, without mention of fluctuations: Secondary | ICD-10-CM

## 2020-05-07 DIAGNOSIS — R5381 Other malaise: Secondary | ICD-10-CM | POA: Diagnosis not present

## 2020-05-07 DIAGNOSIS — Z9181 History of falling: Secondary | ICD-10-CM

## 2020-05-07 DIAGNOSIS — G20 Parkinson's disease: Secondary | ICD-10-CM | POA: Diagnosis not present

## 2020-05-07 DIAGNOSIS — Z9289 Personal history of other medical treatment: Secondary | ICD-10-CM | POA: Diagnosis not present

## 2020-05-07 DIAGNOSIS — Z515 Encounter for palliative care: Secondary | ICD-10-CM

## 2020-05-07 NOTE — Progress Notes (Signed)
Subjective:    Patient ID: Adam Moss Nurse is a 86 y.o. male.  HPI     Interim history:   [name] is a very pleasant 86 year old left-handed gentleman with an underlying medical history of degenerative spine disease, allergies, BPH, hypertension, arthritis, reflux disease, and insomnia, who presents for Follow-up consultation of his Parkinson's disease. He is accompanied by his daughter in law today. I last saw him on 10/16/19, at which time he reported doing a little better.  He was back into his independent living apartment with a caretaker daily.  He had seen cardiology.  He had been hospitalized for 3 days in February for dehydration and UTI.  He had a fall in July.  He was hospitalized from 02/20/2020 through 02/25/2020.  Unfortunately, he sustained a thoracic vertebral compression fractures.  He is followed by oncology for his multiple myeloma.   He had a T and L spine Xray through the ER on 02/20/20 and I reviewed the results: IMPRESSION: 1. Acute fractures of the T2 and T12 vertebral bodies, as described. The T2 fracture appears pathologic. 2. Interval pathologic fracture of the L2 vertebral body compared with CTs from September. This fracture does not appear acute. No definite acute fractures in the lumbar spine. 3. Multiple lytic lesions are again noted throughout the thoracolumbar spine and sacrum consistent with multiple myeloma or metastatic disease. Correlate with previous biopsy results. 4. Progressive degenerative changes at L1-2 with a new extruded disc fragment containing vacuum phenomenon extending inferiorly behind the L2 vertebral body on the left. This may contribute to left-sided nerve root encroachment. 5. Aortic Atherosclerosis (ICD10-I70.0) and Emphysema (ICD10-J43.9).    Today, 05/07/2020: He reports feeling fairly stable, still in skilled nursing facility, [facility].  He is going to transition to assisted living next week.  He denies any back pain  but has stiffness.  He has noticed worsening tremor.  Constipation is under reasonable control because he is hydrating better.  Appetite is down in weight has reduced.  He also feels globally weaker.  He had physical therapy and has been able to walk with his walker independently but physical therapy has essentially been phased out.  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   The patient's allergies, current medications, family history, past medical history, past social history, past surgical history and problem list were reviewed and updated as appropriate.    Previously (copied from previous notes for reference):   I saw him in a virtual visit on 08/21/2019, at which time he requested a sooner than his scheduled appointment for his foot pain. I did not suggest any new medications at the time.  We talked about potentially trying gabapentin if needed.  His daughter-in-law indicated that she might have him try CBD oil.     I saw him on 04/18/2019, at which time he reported interim stress, he was not sleeping very well, he had an increase in his tremor on the left side.  He was noted to have edema.  He was hospitalized for this and was noted to have lymphedema.  He was noted to have several bony lytic lesions and had a pathological fracture of his right clavicle.  He was eventually diagnosed with multiple myeloma.  He is being followed by oncology for this.  He was advised to start taking melatonin at night for sleep and continue with his Parkinson's medication.     Of note, he missed an appointment on 02/19/2019.  I saw him on 08/21/2018, at which  time he reported feeling fairly stable, with the exception of increase in tremor noted.  He was on Rytary 4 times a day about 6 hourly.  He was advised to try to increase the Rytary 195 mg strength to 1 pill 5 times a day, about 5 hourly.      02/15/2018, at which time he reported feeling stable. He had occasional issues with constipation and had some mild  forgetfulness, otherwise was doing well. I suggested he continue with his Rytary.    I saw him on 08/18/2017, at which time he felt stable. We continued with his Rytary, 195 mg qid.      I saw him on 04/18/17, at which time he reported worsening tremor, he was having some difficulty with fine motor control. He was taking Rytary 4 times a day and at bedtime. He was trying to keep set schedule for his bedtime routine and wake up routine and medication regimen. I suggested he change the timing of his medication to 1 pill at 6, 11, 4 PM and 10 PM. We talked about potentially increasing the medication to 1 pill 5 times a day.   I saw him on 10/13/2016, at which time he reported doing okay. He had occasional muscle pain in different areas of his lower back and also thighs, sometimes around the ankles. He was trying to walk on a regular basis. Memory mood were stable. Sadly, his wife passed away in 2016/06/17. I suggested, we keep his meds the same.     We had to cancel an appointment for 06/08/2016 and he missed an appointment on 07/21/2016. I saw him on 02/04/2016, at which time he reported doing okay, tolerating Rytary 195 mg qid without significant side effects. He was not always exercising regularly. He does visit his wife in the afternoon every day. He was not always hydrating well enough. He had gained some weight. He had no recent falls, no complaints of hallucinations or memory issues, occasional depressed mood but no sustained symptoms of depression. I suggested we continue with his medication regimen. He was encouraged to hydrate better and exercise on a more regular basis.   I saw him on 09/24/2015, at which time he reported doing a little better, able to tolerate the Rytary qid. Thankfully, h had no recent falls. He was not sleeping very well at night. He would visit his wife in memory care every day. His 3 children with visit about once a month. Is trying to hydrate well. He had noticed some  ankle swelling and residual knee pain bilaterally. He was able to pursue his hobby of building model ships. I suggested he continue with Rytary at the current dose. He was encouraged to try melatonin for sleep.    I saw him on 05/26/2015, at which time he reported doing somewhat better with the new medication, Rytary, with improvement noted in fine motor skills and tremors. He had recent blood work through his primary care physician which I reviewed at the time: Lipid profile was unremarkable with the exception of triglycerides borderline at 155, BMP was normal and CBC was normal. He reported that his leg swelling was a little better. He was avoiding nighttime driving. He was visiting his wife and memory care regularly. He was able to tolerate the new medication which was really reassuring. She was taking Rytary 195 mg, one pill at 9 AM, 1 pill at 5 PM, 1 pill at bedtime which is usually around midnight. He had gained about 10  pounds and was trying to lose weight. He worried about his weight gain but did admit to having good food and lots of choices for dessert at his assisted living place. I suggested a gentle increase in his medication to 1 pill 4 times a day.   I first met him on 02/21/2015 at the request of his primary neurologist, at which time the patient reported a history of left-sided tremor, fine motor dyscontrol, and gait difficulties. Symptoms dated back to 2-3 years prior. He had multiple medication intolerances particularly issues with GI side effects. I suggested a trial of Rytary, 95 mg strength with titration to 2 pills 3 times a day. I also suggested we proceed with a DaT scan and I referred him to [facility] nuclear medicine. He had the study on 03/19/2015 which showed abnormal image grade 1: Asymmetric uptake with normal or almost normal putamen activity in one hemisphere and with a more marked reduction in the contralateral putamen. This pattern of activity can be  seen with Parkinson's disease or related syndromes.    Of note, the radiotracer uptake was decreased on the right. We called the patient with the test results. The patient emailed me in August reporting a slight decrease in his tremor with a new medication and ability to tolerated thus far. He asked for change and pill strength of possible because the medication is very expensive. I changed him to 195 mg strength one pill 3 times a day.    02/21/2015: He has a history of left-sided tremor, fine motor dyscontrol, and gait difficulty. 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 Symptoms date back to 2014 or the year before, when he started having difficulty getting out of a chair, tremors on the left side, slowness, and he started seeing you at the neuroscience center in [facility]. He was diagnosed with Parkinson's disease and tried on different medications but had side effects, primarily GI related side effects. He has a history of cholecystectomy. Sinemet caused severe abdominal pain and nausea. He was tried on Neupro which also caused stomach pain, and on Azilect low-dose he did not think he had any response. He has been on amantadine. His previous records were reviewed: This includes neurologic office notes from 01/10/2015, 11/08/2014, 09/09/2014, 08/19/2014, 05/20/2014, 02/18/2014, 11/30/2013, 07/06/2013, 06/01/2013, and 02/23/2013. He had a brain MRI years ago but results are not available for my review. He is currently on amantadine 100 mg twice daily. In 2015 he was tried on pramipexole but had severe GI upset. In July 2015 he reported that he had improvement with Neupro but stomach pain. In January 2016 he tried Sinemet but had to stop after a few days only. Earlier this year his Azilect was discontinued. He has no family history of Parkinson's disease. He reports mild memory loss and mild sleep  issues. He currently resides in independent living at the [facility]. This is in [place]. His wife is a Marine scientist care. He drives. He has no recent history of falls. He tries to stay active physically. He has right knee pain and wears a soft brace around his right knee. He has a remote history of injury to his right knee. He has been off of amantadine for the past 2 weeks or so. He had significant blurry vision while on it to the point where he had to see an  ophthalmologist. His blurry vision has improved since he stopped the amantadine. He feels that his symptoms have been slowly progressive. He is primarily bothered by his fine motor dyscontrol and tremor. He drives without significant problems but does not drive long distances.  His Past Medical History Is Significant For: Past Medical History:  Diagnosis Date  . Cancer ([place])    skin  . COPD (chronic obstructive pulmonary disease) ([place])   . Hyperlipemia   . Hypertension   . Lymphedema of both lower extremities   . Parkinson disease ([place])   . Parkinsonism ([place]) 02/21/2015  . Spinal stenosis   . Tremor     His Past Surgical History Is Significant For: Past Surgical History:  Procedure Laterality Date  . APPENDECTOMY    . CATARACT EXTRACTION    . CHOLECYSTECTOMY    . TONSILLECTOMY    . VASECTOMY      His Family History Is Significant For: Family History  Problem Relation Age of Onset  . Cancer Mother   . Heart disease Father     His Social History Is Significant For: Social History   Socioeconomic History  . Marital status: Married    Spouse name: Not on file  . Number of children: 3  . Years of education: PhD  . Highest education level: Not on file  Occupational History  . Occupation: Retired  Tobacco Use  . Smoking status: Never Smoker  . Smokeless tobacco: Never Used  Vaping Use  . Vaping Use: Never used  Substance and Sexual Activity  . Alcohol use: No    Alcohol/week: 0.0 standard drinks  . Drug  use: No  . Sexual activity: Not Currently  Other Topics Concern  . Not on file  Social History Narrative   Denies caffeine use    Social Determinants of Health   Financial Resource Strain:   . Difficulty of Paying Living Expenses: Not on file  Food Insecurity:   . Worried About Charity fundraiser in the Last Year: Not on file  . Ran Out of Food in the Last Year: Not on file  Transportation Needs:   . Lack of Transportation (Medical): Not on file  . Lack of Transportation (Non-Medical): Not on file  Physical Activity:   . Days of Exercise per Week: Not on file  . Minutes of Exercise per Session: Not on file  Stress:   . Feeling of Stress : Not on file  Social Connections:   . Frequency of Communication with Friends and Family: Not on file  . Frequency of Social Gatherings with Friends and Family: Not on file  . Attends Religious Services: Not on file  . Active Member of Clubs or Organizations: Not on file  . Attends Archivist Meetings: Not on file  . Marital Status: Not on file    His Allergies Are:  Allergies  Allergen Reactions  . Carbidopa-Levodopa Other (See Comments)    Severe stomach pains, can take rytary  . Oxycodone-Acetaminophen Other (See Comments)    "boils on skin"  . Tyloxapol   . Acetaminophen Rash, Nausea And Vomiting and Hives  . Pramipexole Nausea Only  :   His Current Medications Are:  Outpatient Encounter Medications as of 05/07/2020  Medication Sig  . aspirin 81 MG tablet Take 81 mg by mouth daily.  . Calcium Carbonate-Vitamin D (OYSTER SHELL CALCIUM/D) 250-125 MG-UNIT TABS Take 1 tablet by mouth daily.   . Calcium Citrate 250 MG TABS Take 1 tablet by mouth  daily.   . Carbidopa-Levodopa ER (RYTARY) 48.75-195 MG CPCR Take 195 mg by mouth 5 (five) times daily.  . cetirizine (ZYRTEC) 10 MG tablet Take 10 mg by mouth daily.  . finasteride (PROSCAR) 5 MG tablet Take 1 tablet (5 mg total) by mouth daily.  . fluticasone (FLONASE) 50 MCG/ACT  nasal spray Place 1 spray into both nostrils daily as needed for allergies.   . Fluticasone-Umeclidin-Vilant (TRELEGY ELLIPTA) 100-62.5-25 MCG/INH AEPB Inhale 1 spray into the lungs daily.   . furosemide (LASIX) 20 MG tablet Take 40 mg by mouth 2 (two) times daily.   Marland Kitchen ibuprofen (ADVIL) 400 MG tablet Take 400 mg by mouth every 6 (six) hours as needed.  Marland Kitchen ipratropium (ATROVENT) 0.03 % nasal spray Place 0.03 mLs into both nostrils 3 (three) times daily as needed.  . lactulose (CHRONULAC) 10 GM/15ML solution Take 10 g by mouth every 12 (twelve) hours as needed for mild constipation.  . niacin 500 MG tablet Take 500 mg by mouth every morning.   Marland Kitchen omeprazole (PRILOSEC) 40 MG capsule Take 40 mg by mouth daily.  . polyethylene glycol (MIRALAX / GLYCOLAX) 17 g packet Take 17 g by mouth daily.  . potassium chloride (KLOR-CON) 10 MEQ tablet Take 10 mEq by mouth 2 (two) times daily.  . prochlorperazine (COMPAZINE) 10 MG tablet Take 1 tablet (10 mg total) by mouth every 6 (six) hours as needed for nausea or vomiting.  . rosuvastatin (CRESTOR) 20 MG tablet Take 20 mg by mouth at bedtime.  . senna-docusate (SENNA-PLUS) 8.6-50 MG tablet Take 2 tablets by mouth 2 (two) times daily as needed for mild constipation.  . sertraline (ZOLOFT) 25 MG tablet Take 25 mg by mouth at bedtime.   Marland Kitchen spironolactone (ALDACTONE) 25 MG tablet Take 25 mg by mouth daily.   . tamsulosin (FLOMAX) 0.4 MG CAPS capsule Take 1 capsule (0.4 mg total) by mouth daily.  . vitamin B-12 (CYANOCOBALAMIN) 1000 MCG tablet Take 1,000 mcg by mouth daily.   No facility-administered encounter medications on file as of 05/07/2020.  :  Review of Systems:  Out of a complete 14 point review of systems, all are reviewed and negative with the exception of these symptoms as listed below: Review of Systems  Neurological:       Pt her for f/u on PD. Reports 2 falls since last visit. 1 resulted in hsp visit. Pt reports he is not as stable on his feet.      Objective:  Neurological Exam  Physical Exam Physical Examination:   Vitals:   05/07/20 1127  BP: 116/71  Pulse: 83    General Examination: The patient is a very pleasant 86 y.o. male in no acute distress. He appears frail and deconditioned, in a wheelchair, well-groomed.  HEENT:Normocephalic, atraumatic, pupils are equal, round and reactive to light. Corrective eyeglasses in place. He has mild saccadic breakdown with eye movements, no nystagmus noted.  Moderate facial masking noted, moderate nuchal rigidity noted, moderate hypophonia with slight dysarthria and slight hoarseness noted.  No significant sialorrhea.  Tongue protrudes centrally and palate elevates symmetrically.  Bilateral hearing aids.   Chest:Clear to auscultation without wheezing, rhonchi or crackles noted.  Heart:S1+S2+0, regular and normal without murmurs, rubs or gallops noted.   Abdomen:Soft, non-tender and non-distended with normal bowel sounds appreciated on auscultation.  Extremities:There is1-2+edema in the distal lower extremities bilaterally,R>L, compression socks in place.  Skin: Warm and dry without trophic changes noted.  Musculoskeletal: exam reveals a no change.  Neurologically:  Mental status: The patient is awake, alert and oriented in all 4 spheres. Hisimmediate and remote memory, attention, language skills and fund of knowledge arefairlyappropriate. There ismild slowness in thinking.Thought process is linear. Mood is normaland affect is normal.  Cranial nerves II - XII are as described above under HEENT exam. In addition:Motor exam: thin bulk, and strength globally of 4/5. He has a mildly increased tone on the left side, worse compared to before, he has a moderate resting tremor in the left upper and lower extremities, intermittent slight resting tremor in the RUE. Romberg is not testable due to safety reasons. Fine motor testing: he has moderate to severe impairment on the  L, better on the R.  Cerebellar testing: No dysmetria or intention tremor.  Sensory exam: intact to light touch in the upper and lower extremities.  Gait, station and balance: I did not have him stand or walk for me today, no walker. In [place].   Assessmentand Plan:   In summary, Adam Moss a very pleasant 86 year old malewith anunderlying medical history of degenerative spine disease, allergies, BPH, arthritis, reflux disease, hypertension, multiple myeloma, history of recent fall with vertebral fractures including pathological fracture, who presents for follow-up consultation of his left-sided predominant Parkinson's disease, complicated by significant medication intolerancesin the past, mostly secondary to GI related symptoms (stomach pain, exacerbation of reflux, nausea and vomiting), recent falls, constipation, deconditioning.  He was unable to tolerate Mirapex and Sinemetin the past.Fortunately, he was able to tolerate Rytarybrand-name, which was started at 95 mg strength, up to 2 pills 3 times a day and then we switched to 195 mg strength, 1 pill3 times a day, then 4 times a day, with improvement in symptoms including tremor and fine motor skills. He is now maintaining on 1 pill 5 times a day.He has had interim complications, including a pathologic right clavicle fracture and subsequent testing also revealed other spots in the bones, concerning for lytic lesions.  Has been following with oncology for his multiple myeloma.  Currently on a hiatus with his medication for a mo nth or 2.  He had a fall in late July and had vertebral fractures at T2 and T12 but also interim fracture at L2.  He had struggled with foot pain which subsequently improved.  He is currently in skilled nursing facility until next week and he is going to transition to assisted living.  Previously he was an independent living.  He is reminded to use his walker at all times.  We talked about the importance of fall  prevention and being proactive about constipation issues.  Of note, he had a DaT scan in August 2016 which was supportive of Parkinson's disease with decreased radiotracer uptake on the right. I suggested we maintain him on Rytary 5 times a day.  He is advised to follow-up in 3 months, sooner if needed.  I answered alltheirquestions today andThe patient and his daughter-in-law were in agreement.  I spent 40 minutes in total face-to-face time and in reviewing records during pre-charting, more than 50% of which was spent in counseling and coordination of care, reviewing test results, reviewing medications and treatment regimen and/or in discussing or reviewing the diagnosis of PD, the prognosis and treatment options. Pertinent laboratory and imaging test results that were available during this visit with the patient were reviewed by me and considered in my medical decision making (see chart for details).

## 2020-05-07 NOTE — Patient Instructions (Signed)
As discussed, we will continue with your Rytary 1 pill 5 times a day.  Please be careful when used standing especially when you return.  Use your walker at all times and ask for assistance if you feel off balance or lightheaded.  Please try to hydrate well with water.  I wish you good luck with transitioning to assisted living next week.  Your prescription is up-to-date, please follow-up in about 3 months

## 2020-05-07 NOTE — Progress Notes (Signed)
[facility] Consult Note Telephone: [phone]  Fax: [phone]  PATIENT NAME: Adam Moss DOB: 1934/05/25 MRN: [id number]  PRIMARY CARE PROVIDER:   Perrin Maltese, MD  REFERRING PROVIDER:  Dr. Governor Specking   RESPONSIBLE PARTY:   Binnie Kand, son [phone]     RECOMMENDATIONS and PLAN:  1.  Advanced care planning.  Patient is DNR.  Called son to update on visit.  Left VM with reason for call and contact info  2. Functional status.  Patient can ambulate with walker.  He is continent of B&B.  He is mostly independent of ADLs but does endorse that he would like to have someone close by when he is showering/bathing.  Patient is left-hand dominant and due to his Parkinson's has tremors in the left arm and hand.  Is trying to use his right hand to feed himself.  Is working with physical therapy.  States that when he moves to ALF that he will continue occupational therapy.  Continue PT/OT as ordered  3.  Nutritional status.  States that his appetite is good.  Patient's weight has been stable at around 153 pounds.  Continue supportive care at facility and monitoring of weight.  4.  Support.  Next week he is to transition to [facility].  He has some concerns about how much help he will be receiving for things like showering.  He is talking with the SW at the facility about this.  Does state that his daughters are looking into getting him LTC insurance to help cover the cost as he may need to hire someone to come in and help him a couple of times a week.  Patient has been working with Education officer, museum at facility to make sure his needs are met in assisted living.  Patient stable at this time.  Palliative will continue to monitor for symptom management/decline and make recommendations as needed.  We will continue follow-up in assisted living every 6 to 8 weeks.  I spent 30 minutes providing this consultation,  from 9:00 to 9:30  including time spent with patient/family, chart review, provider coordination, documentation. More than 50% of the time in this consultation was spent coordinating communication.   HISTORY OF PRESENT ILLNESS:  [name] is a 86 y.o. year old male with multiple medical problems including Parkinson's, anascara, COPD, HTN, HLD, venous insufficiency. Palliative Care was asked to help address goals of care. Patient had fall with weakness and was in hospital 7/28-02/25/20.  He has been in [employer] at [facility] for rehab.  Patient was on Revlimid for his myeloma.  Has been off of it for over a month now.  Is being followed by oncology and has an appointment in November for follow-up.  Continue follow-up and recommendations by oncology  CODE STATUS: DNR  PPS: 40% HOSPICE ELIGIBILITY/DIAGNOSIS: TBD  PHYSICAL EXAM:  HR 74 O2 97% on RA  General: NAD, frail appearing, thin Cardiovascular: regular rate and rhythm Pulmonary: lung sounds clear; normal respiratory effort Abdomen: soft, nontender, + bowel sounds GU: no suprapubic tenderness Extremities: no edema, no joint deformities Skin: no rashes on exposed skin Neurological: Weakness but otherwise nonfocal; has tremor noted to the left arm  PAST MEDICAL HISTORY:  Past Medical History:  Diagnosis Date  . Cancer ([place])    skin  . COPD (chronic obstructive pulmonary disease) ([place])   . Hyperlipemia   . Hypertension   . Lymphedema of both lower extremities   . Parkinson  disease ([place])   . Parkinsonism ([place]) 02/21/2015  . Spinal stenosis   . Tremor     SOCIAL HX:  Social History   Tobacco Use  . Smoking status: Never Smoker  . Smokeless tobacco: Never Used  Substance Use Topics  . Alcohol use: No    Alcohol/week: 0.0 standard drinks    ALLERGIES:  Allergies  Allergen Reactions  . Carbidopa-Levodopa Other (See Comments)    Severe stomach pains, can take rytary  . Oxycodone-Acetaminophen Other (See Comments)    "boils on skin"    . Tyloxapol   . Acetaminophen Rash, Nausea And Vomiting and Hives  . Pramipexole Nausea Only     PERTINENT MEDICATIONS:  Outpatient Encounter Medications as of 84/13/2021  Medication Sig  . aspirin 81 MG tablet Take 81 mg by mouth daily.  . Calcium Carbonate-Vitamin D (OYSTER SHELL CALCIUM/D) 250-125 MG-UNIT TABS Take 1 tablet by mouth daily.   . Calcium Citrate 250 MG TABS Take 1 tablet by mouth daily.   . Carbidopa-Levodopa ER (RYTARY) 48.75-195 MG CPCR Take 195 mg by mouth 5 (five) times daily.  . cetirizine (ZYRTEC) 10 MG tablet Take 10 mg by mouth daily.  . finasteride (PROSCAR) 5 MG tablet Take 1 tablet (5 mg total) by mouth daily.  . fluticasone (FLONASE) 50 MCG/ACT nasal spray Place 1 spray into both nostrils daily as needed for allergies.   . Fluticasone-Umeclidin-Vilant (TRELEGY ELLIPTA) 100-62.5-25 MCG/INH AEPB Inhale 1 spray into the lungs daily.   . furosemide (LASIX) 20 MG tablet Take 40 mg by mouth 2 (two) times daily.   Marland Kitchen ipratropium (ATROVENT) 0.03 % nasal spray Place 0.03 mLs into both nostrils 3 (three) times daily as needed.  . lactulose (CHRONULAC) 10 GM/15ML solution Take 10 g by mouth every 12 (twelve) hours as needed for mild constipation.  . niacin 500 MG tablet Take 500 mg by mouth every morning.   Marland Kitchen omeprazole (PRILOSEC) 40 MG capsule Take 40 mg by mouth daily.  . polyethylene glycol (MIRALAX / GLYCOLAX) 17 g packet Take 17 g by mouth daily.  . potassium chloride (KLOR-CON) 10 MEQ tablet Take 10 mEq by mouth 2 (two) times daily.  . prochlorperazine (COMPAZINE) 10 MG tablet Take 1 tablet (10 mg total) by mouth every 6 (six) hours as needed for nausea or vomiting.  . rosuvastatin (CRESTOR) 20 MG tablet Take 20 mg by mouth at bedtime.  . senna-docusate (SENNA-PLUS) 8.6-50 MG tablet Take 2 tablets by mouth 2 (two) times daily as needed for mild constipation.  . sertraline (ZOLOFT) 25 MG tablet Take 25 mg by mouth at bedtime.   Marland Kitchen spironolactone (ALDACTONE) 25 MG  tablet Take 25 mg by mouth daily.   . tamsulosin (FLOMAX) 0.4 MG CAPS capsule Take 1 capsule (0.4 mg total) by mouth daily.  . vitamin B-12 (CYANOCOBALAMIN) 1000 MCG tablet Take 1,000 mcg by mouth daily.   No facility-administered encounter medications on file as of 84/13/2021.      Sherrin Stahle Jenetta Downer, NP

## 2020-05-08 DIAGNOSIS — R2689 Other abnormalities of gait and mobility: Secondary | ICD-10-CM | POA: Diagnosis not present

## 2020-05-08 DIAGNOSIS — S22020D Wedge compression fracture of second thoracic vertebra, subsequent encounter for fracture with routine healing: Secondary | ICD-10-CM | POA: Diagnosis not present

## 2020-05-08 DIAGNOSIS — M6281 Muscle weakness (generalized): Secondary | ICD-10-CM | POA: Diagnosis not present

## 2020-05-08 DIAGNOSIS — S22080D Wedge compression fracture of T11-T12 vertebra, subsequent encounter for fracture with routine healing: Secondary | ICD-10-CM | POA: Diagnosis not present

## 2020-05-08 DIAGNOSIS — G20 Parkinson's disease: Secondary | ICD-10-CM | POA: Diagnosis not present

## 2020-05-08 DIAGNOSIS — C9000 Multiple myeloma not having achieved remission: Secondary | ICD-10-CM | POA: Diagnosis not present

## 2020-05-09 DIAGNOSIS — S22020D Wedge compression fracture of second thoracic vertebra, subsequent encounter for fracture with routine healing: Secondary | ICD-10-CM | POA: Diagnosis not present

## 2020-05-09 DIAGNOSIS — M6281 Muscle weakness (generalized): Secondary | ICD-10-CM | POA: Diagnosis not present

## 2020-05-09 DIAGNOSIS — S22080D Wedge compression fracture of T11-T12 vertebra, subsequent encounter for fracture with routine healing: Secondary | ICD-10-CM | POA: Diagnosis not present

## 2020-05-09 DIAGNOSIS — G20 Parkinson's disease: Secondary | ICD-10-CM | POA: Diagnosis not present

## 2020-05-09 DIAGNOSIS — R2689 Other abnormalities of gait and mobility: Secondary | ICD-10-CM | POA: Diagnosis not present

## 2020-05-09 DIAGNOSIS — C9000 Multiple myeloma not having achieved remission: Secondary | ICD-10-CM | POA: Diagnosis not present

## 2020-05-12 ENCOUNTER — Telehealth: Payer: Federal, State, Local not specified - PPO | Admitting: Hospice and Palliative Medicine

## 2020-05-12 DIAGNOSIS — C9000 Multiple myeloma not having achieved remission: Secondary | ICD-10-CM | POA: Diagnosis not present

## 2020-05-12 DIAGNOSIS — S22080D Wedge compression fracture of T11-T12 vertebra, subsequent encounter for fracture with routine healing: Secondary | ICD-10-CM | POA: Diagnosis not present

## 2020-05-12 DIAGNOSIS — G20 Parkinson's disease: Secondary | ICD-10-CM | POA: Diagnosis not present

## 2020-05-12 DIAGNOSIS — R2689 Other abnormalities of gait and mobility: Secondary | ICD-10-CM | POA: Diagnosis not present

## 2020-05-12 DIAGNOSIS — S22020D Wedge compression fracture of second thoracic vertebra, subsequent encounter for fracture with routine healing: Secondary | ICD-10-CM | POA: Diagnosis not present

## 2020-05-12 DIAGNOSIS — M6281 Muscle weakness (generalized): Secondary | ICD-10-CM | POA: Diagnosis not present

## 2020-05-13 ENCOUNTER — Encounter
Admission: RE | Admit: 2020-05-13 | Discharge: 2020-05-13 | Disposition: A | Discharge status: Home / Self Care | Payer: Medicare Other | Source: Ambulatory Visit | Attending: Internal Medicine | Admitting: Internal Medicine

## 2020-05-13 DIAGNOSIS — M6281 Muscle weakness (generalized): Secondary | ICD-10-CM | POA: Diagnosis not present

## 2020-05-13 DIAGNOSIS — R2689 Other abnormalities of gait and mobility: Secondary | ICD-10-CM | POA: Diagnosis not present

## 2020-05-13 DIAGNOSIS — S22080D Wedge compression fracture of T11-T12 vertebra, subsequent encounter for fracture with routine healing: Secondary | ICD-10-CM | POA: Diagnosis not present

## 2020-05-13 DIAGNOSIS — G20 Parkinson's disease: Secondary | ICD-10-CM | POA: Diagnosis not present

## 2020-05-13 DIAGNOSIS — S22020D Wedge compression fracture of second thoracic vertebra, subsequent encounter for fracture with routine healing: Secondary | ICD-10-CM | POA: Diagnosis not present

## 2020-05-13 DIAGNOSIS — C9000 Multiple myeloma not having achieved remission: Secondary | ICD-10-CM | POA: Diagnosis not present

## 2020-05-14 DIAGNOSIS — C9000 Multiple myeloma not having achieved remission: Secondary | ICD-10-CM | POA: Diagnosis not present

## 2020-05-14 DIAGNOSIS — R262 Difficulty in walking, not elsewhere classified: Secondary | ICD-10-CM | POA: Diagnosis not present

## 2020-05-14 DIAGNOSIS — Z9181 History of falling: Secondary | ICD-10-CM | POA: Diagnosis not present

## 2020-05-14 DIAGNOSIS — M6281 Muscle weakness (generalized): Secondary | ICD-10-CM | POA: Diagnosis not present

## 2020-05-14 DIAGNOSIS — R279 Unspecified lack of coordination: Secondary | ICD-10-CM | POA: Diagnosis not present

## 2020-05-14 DIAGNOSIS — G20 Parkinson's disease: Secondary | ICD-10-CM | POA: Diagnosis not present

## 2020-05-15 DIAGNOSIS — Z9181 History of falling: Secondary | ICD-10-CM | POA: Diagnosis not present

## 2020-05-15 DIAGNOSIS — G20 Parkinson's disease: Secondary | ICD-10-CM | POA: Diagnosis not present

## 2020-05-15 DIAGNOSIS — R262 Difficulty in walking, not elsewhere classified: Secondary | ICD-10-CM | POA: Diagnosis not present

## 2020-05-15 DIAGNOSIS — C9000 Multiple myeloma not having achieved remission: Secondary | ICD-10-CM | POA: Diagnosis not present

## 2020-05-15 DIAGNOSIS — M6281 Muscle weakness (generalized): Secondary | ICD-10-CM | POA: Diagnosis not present

## 2020-05-15 DIAGNOSIS — R279 Unspecified lack of coordination: Secondary | ICD-10-CM | POA: Diagnosis not present

## 2020-05-16 DIAGNOSIS — R279 Unspecified lack of coordination: Secondary | ICD-10-CM | POA: Diagnosis not present

## 2020-05-16 DIAGNOSIS — Z9181 History of falling: Secondary | ICD-10-CM | POA: Diagnosis not present

## 2020-05-16 DIAGNOSIS — R262 Difficulty in walking, not elsewhere classified: Secondary | ICD-10-CM | POA: Diagnosis not present

## 2020-05-16 DIAGNOSIS — M6281 Muscle weakness (generalized): Secondary | ICD-10-CM | POA: Diagnosis not present

## 2020-05-16 DIAGNOSIS — G20 Parkinson's disease: Secondary | ICD-10-CM | POA: Diagnosis not present

## 2020-05-16 DIAGNOSIS — C9000 Multiple myeloma not having achieved remission: Secondary | ICD-10-CM | POA: Diagnosis not present

## 2020-05-19 DIAGNOSIS — L570 Actinic keratosis: Secondary | ICD-10-CM | POA: Diagnosis not present

## 2020-05-19 DIAGNOSIS — L821 Other seborrheic keratosis: Secondary | ICD-10-CM | POA: Diagnosis not present

## 2020-05-19 DIAGNOSIS — D1800 Hemangioma unspecified site: Secondary | ICD-10-CM | POA: Diagnosis not present

## 2020-05-19 DIAGNOSIS — D692 Other nonthrombocytopenic purpura: Secondary | ICD-10-CM | POA: Diagnosis not present

## 2020-05-19 DIAGNOSIS — L820 Inflamed seborrheic keratosis: Secondary | ICD-10-CM | POA: Diagnosis not present

## 2020-05-19 DIAGNOSIS — L814 Other melanin hyperpigmentation: Secondary | ICD-10-CM | POA: Diagnosis not present

## 2020-05-19 DIAGNOSIS — D229 Melanocytic nevi, unspecified: Secondary | ICD-10-CM | POA: Diagnosis not present

## 2020-05-19 DIAGNOSIS — Z85068 Personal history of other malignant neoplasm of small intestine: Secondary | ICD-10-CM | POA: Diagnosis not present

## 2020-05-20 ENCOUNTER — Other Ambulatory Visit: Payer: Self-pay | Admitting: Oncology

## 2020-05-20 DIAGNOSIS — R279 Unspecified lack of coordination: Secondary | ICD-10-CM | POA: Diagnosis not present

## 2020-05-20 DIAGNOSIS — M6281 Muscle weakness (generalized): Secondary | ICD-10-CM | POA: Diagnosis not present

## 2020-05-20 DIAGNOSIS — Z9181 History of falling: Secondary | ICD-10-CM | POA: Diagnosis not present

## 2020-05-20 DIAGNOSIS — G20 Parkinson's disease: Secondary | ICD-10-CM | POA: Diagnosis not present

## 2020-05-20 DIAGNOSIS — C9000 Multiple myeloma not having achieved remission: Secondary | ICD-10-CM | POA: Diagnosis not present

## 2020-05-20 DIAGNOSIS — R262 Difficulty in walking, not elsewhere classified: Secondary | ICD-10-CM | POA: Diagnosis not present

## 2020-05-23 DIAGNOSIS — C9000 Multiple myeloma not having achieved remission: Secondary | ICD-10-CM | POA: Diagnosis not present

## 2020-05-23 DIAGNOSIS — Z9181 History of falling: Secondary | ICD-10-CM | POA: Diagnosis not present

## 2020-05-23 DIAGNOSIS — R262 Difficulty in walking, not elsewhere classified: Secondary | ICD-10-CM | POA: Diagnosis not present

## 2020-05-23 DIAGNOSIS — R279 Unspecified lack of coordination: Secondary | ICD-10-CM | POA: Diagnosis not present

## 2020-05-23 DIAGNOSIS — G20 Parkinson's disease: Secondary | ICD-10-CM | POA: Diagnosis not present

## 2020-05-23 DIAGNOSIS — M6281 Muscle weakness (generalized): Secondary | ICD-10-CM | POA: Diagnosis not present

## 2020-05-26 DIAGNOSIS — I10 Essential (primary) hypertension: Secondary | ICD-10-CM | POA: Diagnosis not present

## 2020-05-26 DIAGNOSIS — R799 Abnormal finding of blood chemistry, unspecified: Secondary | ICD-10-CM | POA: Diagnosis not present

## 2020-05-26 DIAGNOSIS — R609 Edema, unspecified: Secondary | ICD-10-CM | POA: Diagnosis not present

## 2020-05-26 DIAGNOSIS — G20 Parkinson's disease: Secondary | ICD-10-CM | POA: Diagnosis not present

## 2020-05-26 DIAGNOSIS — I351 Nonrheumatic aortic (valve) insufficiency: Secondary | ICD-10-CM | POA: Diagnosis not present

## 2020-05-26 DIAGNOSIS — E785 Hyperlipidemia, unspecified: Secondary | ICD-10-CM | POA: Diagnosis not present

## 2020-05-26 DIAGNOSIS — N401 Enlarged prostate with lower urinary tract symptoms: Secondary | ICD-10-CM | POA: Diagnosis not present

## 2020-05-26 DIAGNOSIS — F321 Major depressive disorder, single episode, moderate: Secondary | ICD-10-CM | POA: Diagnosis not present

## 2020-05-26 DIAGNOSIS — J449 Chronic obstructive pulmonary disease, unspecified: Secondary | ICD-10-CM | POA: Diagnosis not present

## 2020-05-26 DIAGNOSIS — F32A Depression, unspecified: Secondary | ICD-10-CM | POA: Diagnosis not present

## 2020-05-26 DIAGNOSIS — D519 Vitamin B12 deficiency anemia, unspecified: Secondary | ICD-10-CM | POA: Diagnosis not present

## 2020-05-26 DIAGNOSIS — C9000 Multiple myeloma not having achieved remission: Secondary | ICD-10-CM | POA: Diagnosis not present

## 2020-05-26 DIAGNOSIS — E782 Mixed hyperlipidemia: Secondary | ICD-10-CM | POA: Diagnosis not present

## 2020-05-27 DIAGNOSIS — M6281 Muscle weakness (generalized): Secondary | ICD-10-CM | POA: Diagnosis not present

## 2020-05-27 DIAGNOSIS — R600 Localized edema: Secondary | ICD-10-CM | POA: Diagnosis not present

## 2020-05-27 DIAGNOSIS — I872 Venous insufficiency (chronic) (peripheral): Secondary | ICD-10-CM | POA: Diagnosis not present

## 2020-05-27 DIAGNOSIS — R279 Unspecified lack of coordination: Secondary | ICD-10-CM | POA: Diagnosis not present

## 2020-05-27 DIAGNOSIS — G20 Parkinson's disease: Secondary | ICD-10-CM | POA: Diagnosis not present

## 2020-05-27 DIAGNOSIS — E785 Hyperlipidemia, unspecified: Secondary | ICD-10-CM | POA: Diagnosis not present

## 2020-05-27 DIAGNOSIS — R262 Difficulty in walking, not elsewhere classified: Secondary | ICD-10-CM | POA: Diagnosis not present

## 2020-05-27 DIAGNOSIS — C9000 Multiple myeloma not having achieved remission: Secondary | ICD-10-CM | POA: Diagnosis not present

## 2020-05-27 DIAGNOSIS — Z9181 History of falling: Secondary | ICD-10-CM | POA: Diagnosis not present

## 2020-05-27 DIAGNOSIS — J410 Simple chronic bronchitis: Secondary | ICD-10-CM | POA: Diagnosis not present

## 2020-05-27 DIAGNOSIS — Z87440 Personal history of urinary (tract) infections: Secondary | ICD-10-CM | POA: Diagnosis not present

## 2020-05-27 DIAGNOSIS — N400 Enlarged prostate without lower urinary tract symptoms: Secondary | ICD-10-CM | POA: Diagnosis not present

## 2020-05-27 DIAGNOSIS — K2100 Gastro-esophageal reflux disease with esophagitis, without bleeding: Secondary | ICD-10-CM | POA: Diagnosis not present

## 2020-05-27 DIAGNOSIS — J302 Other seasonal allergic rhinitis: Secondary | ICD-10-CM | POA: Diagnosis not present

## 2020-05-27 DIAGNOSIS — K5900 Constipation, unspecified: Secondary | ICD-10-CM | POA: Diagnosis not present

## 2020-05-28 DIAGNOSIS — C9000 Multiple myeloma not having achieved remission: Secondary | ICD-10-CM | POA: Diagnosis not present

## 2020-05-28 DIAGNOSIS — R600 Localized edema: Secondary | ICD-10-CM | POA: Diagnosis not present

## 2020-05-28 DIAGNOSIS — J410 Simple chronic bronchitis: Secondary | ICD-10-CM | POA: Diagnosis not present

## 2020-05-28 DIAGNOSIS — G20 Parkinson's disease: Secondary | ICD-10-CM | POA: Diagnosis not present

## 2020-05-28 DIAGNOSIS — M6281 Muscle weakness (generalized): Secondary | ICD-10-CM | POA: Diagnosis not present

## 2020-05-28 DIAGNOSIS — I872 Venous insufficiency (chronic) (peripheral): Secondary | ICD-10-CM | POA: Diagnosis not present

## 2020-05-30 ENCOUNTER — Other Ambulatory Visit
Admission: RE | Admit: 2020-05-30 | Discharge: 2020-05-30 | Disposition: A | Discharge status: Home / Self Care | Payer: Medicare Other | Source: Ambulatory Visit | Attending: Internal Medicine | Admitting: Internal Medicine

## 2020-05-30 DIAGNOSIS — M6281 Muscle weakness (generalized): Secondary | ICD-10-CM | POA: Diagnosis not present

## 2020-05-30 DIAGNOSIS — R600 Localized edema: Secondary | ICD-10-CM | POA: Diagnosis not present

## 2020-05-30 DIAGNOSIS — G20 Parkinson's disease: Secondary | ICD-10-CM | POA: Diagnosis not present

## 2020-05-30 DIAGNOSIS — J410 Simple chronic bronchitis: Secondary | ICD-10-CM | POA: Diagnosis not present

## 2020-05-30 DIAGNOSIS — I872 Venous insufficiency (chronic) (peripheral): Secondary | ICD-10-CM | POA: Diagnosis not present

## 2020-05-30 DIAGNOSIS — C9000 Multiple myeloma not having achieved remission: Secondary | ICD-10-CM | POA: Insufficient documentation

## 2020-05-30 LAB — CBC WITH DIFFERENTIAL/PLATELET
Abs Immature Granulocytes: 0.03 10*3/uL (ref 0.00–0.07)
Basophils Absolute: 0 10*3/uL (ref 0.0–0.1)
Basophils Relative: 0 %
Eosinophils Absolute: 0.2 10*3/uL (ref 0.0–0.5)
Eosinophils Relative: 3 %
HCT: 32.9 % — ABNORMAL LOW (ref 39.0–52.0)
Hemoglobin: 10.6 g/dL — ABNORMAL LOW (ref 13.0–17.0)
Immature Granulocytes: 1 %
Lymphocytes Relative: 34 %
Lymphs Abs: 1.6 10*3/uL (ref 0.7–4.0)
MCH: 32.3 pg (ref 26.0–34.0)
MCHC: 32.2 g/dL (ref 30.0–36.0)
MCV: 100.3 fL — ABNORMAL HIGH (ref 80.0–100.0)
Monocytes Absolute: 0.4 10*3/uL (ref 0.1–1.0)
Monocytes Relative: 9 %
Neutro Abs: 2.5 10*3/uL (ref 1.7–7.7)
Neutrophils Relative %: 53 %
Platelets: 77 10*3/uL — ABNORMAL LOW (ref 150–400)
RBC: 3.28 MIL/uL — ABNORMAL LOW (ref 4.22–5.81)
RDW: 15.9 % — ABNORMAL HIGH (ref 11.5–15.5)
WBC: 4.7 10*3/uL (ref 4.0–10.5)
nRBC: 0 % (ref 0.0–0.2)

## 2020-05-30 LAB — COMPREHENSIVE METABOLIC PANEL
ALT: 6 U/L (ref 0–44)
AST: 16 U/L (ref 15–41)
Albumin: 3.7 g/dL (ref 3.5–5.0)
Alkaline Phosphatase: 67 U/L (ref 38–126)
Anion gap: 10 (ref 5–15)
BUN: 23 mg/dL (ref 8–23)
CO2: 27 mmol/L (ref 22–32)
Calcium: 9.3 mg/dL (ref 8.9–10.3)
Chloride: 92 mmol/L — ABNORMAL LOW (ref 98–111)
Creatinine, Ser: 1.32 mg/dL — ABNORMAL HIGH (ref 0.61–1.24)
GFR, Estimated: 53 mL/min — ABNORMAL LOW (ref >60)
Glucose, Bld: 96 mg/dL (ref 70–99)
Potassium: 3.6 mmol/L (ref 3.5–5.1)
Sodium: 129 mmol/L — ABNORMAL LOW (ref 135–145)
Total Bilirubin: 0.6 mg/dL (ref 0.3–1.2)
Total Protein: 6 g/dL — ABNORMAL LOW (ref 6.5–8.1)

## 2020-05-31 LAB — IGG, IGA, IGM
IgA: 72 mg/dL (ref 61–437)
IgG (Immunoglobin G), Serum: 557 mg/dL — ABNORMAL LOW (ref 603–1613)
IgM (Immunoglobulin M), Srm: 28 mg/dL (ref 15–143)

## 2020-06-01 NOTE — Progress Notes (Signed)
Adam Moss  [phone] [phone]  ID: Mauri Pole OB: [birth year]  MR#: [id number]  EVO#:[id number]  Patient Care Team: Perrin Maltese, MD as PCP - General (Internal Medicine) Lloyd Huger, MD as Consulting Physician (Hematology and Oncology)   CHIEF COMPLAINT: Kappa light chain myeloma.   INTERVAL HISTORY: Patient returns to clinic today for repeat laboratory work and further evaluation.  He continues to have chronic weakness and fatigue, but states this has improved.  He does not complain of pain today. He has a resting tremor secondary to his Parkinson's, but no other neurologic complaints.  He denies any recent fevers or illnesses.  He has no chest pain, shortness of breath, cough, or hemoptysis.  He denies any nausea, vomiting, or diarrhea.  He admits to occasional constipation.  He has no urinary complaints.  Patient offers no further specific complaints today.  REVIEW OF SYSTEMS:   Review of Systems  Constitutional: Positive for malaise/fatigue. Negative for fever and weight loss.  Respiratory: Negative.  Negative for cough, hemoptysis and shortness of breath.   Cardiovascular: Negative.  Negative for chest pain and leg swelling.  Gastrointestinal: Positive for constipation. Negative for abdominal pain.  Genitourinary: Negative.  Negative for dysuria.  Musculoskeletal: Negative.  Negative for back pain.  Skin: Negative.  Negative for rash.  Neurological: Positive for tremors and weakness. Negative for dizziness, focal weakness and headaches.  Psychiatric/Behavioral: Negative.  The patient is not nervous/anxious.     As per HPI. Otherwise, a complete review of systems is negative.  PAST MEDICAL HISTORY: Past Medical History:  Diagnosis Date  . Cancer ([place])    skin  . COPD (chronic obstructive pulmonary disease) ([place])   . Hyperlipemia   . Hypertension   . Lymphedema of both lower extremities   . Parkinson disease ([place])    . Parkinsonism ([place]) 02/21/2015  . Spinal stenosis   . Tremor     PAST SURGICAL HISTORY: Past Surgical History:  Procedure Laterality Date  . APPENDECTOMY    . CATARACT EXTRACTION    . CHOLECYSTECTOMY    . TONSILLECTOMY    . VASECTOMY      FAMILY HISTORY: Family History  Problem Relation Age of Onset  . Cancer Mother   . Heart disease Father     ADVANCED DIRECTIVES (Y/N):  N  HEALTH MAINTENANCE: Social History   Tobacco Use  . Smoking status: Never Smoker  . Smokeless tobacco: Never Used  Vaping Use  . Vaping Use: Never used  Substance Use Topics  . Alcohol use: No    Alcohol/week: 0.0 standard drinks  . Drug use: No     Colonoscopy:  PAP:  Bone density:  Lipid panel:  Allergies  Allergen Reactions  . Carbidopa-Levodopa Other (See Comments)    Severe stomach pains, can take rytary  . Oxycodone-Acetaminophen Other (See Comments)    "boils on skin"  . Tyloxapol   . Acetaminophen Rash, Nausea And Vomiting and Hives  . Pramipexole Nausea Only    Current Outpatient Medications  Medication Sig Dispense Refill  . aspirin 81 MG tablet Take 81 mg by mouth daily.    . Calcium Carbonate-Vitamin D (OYSTER SHELL CALCIUM/D) 250-125 MG-UNIT TABS Take 1 tablet by mouth daily.     . Calcium Citrate 250 MG TABS Take 1 tablet by mouth daily.     . Carbidopa-Levodopa ER (RYTARY) 48.75-195 MG CPCR Take 195 mg by mouth 5 (five) times daily. 450 capsule 3  . cetirizine (ZYRTEC) 10  MG tablet Take 10 mg by mouth daily.    . finasteride (PROSCAR) 5 MG tablet Take 1 tablet (5 mg total) by mouth daily. 90 tablet 3  . fluticasone (FLONASE) 50 MCG/ACT nasal spray Place 1 spray into both nostrils daily as needed for allergies.     . Fluticasone-Umeclidin-Vilant (TRELEGY ELLIPTA) 100-62.5-25 MCG/INH AEPB Inhale 1 spray into the lungs daily.     . furosemide (LASIX) 40 MG tablet Take 40 mg by mouth 2 (two) times daily.    Marland Kitchen ibuprofen (ADVIL) 400 MG tablet Take 400 mg by mouth every 6  (six) hours as needed.    Marland Kitchen ipratropium (ATROVENT) 0.03 % nasal spray Place 0.03 mLs into both nostrils 3 (three) times daily as needed.  11  . lactulose (CHRONULAC) 10 GM/15ML solution Take 10 g by mouth every 12 (twelve) hours as needed for mild constipation.    . niacin 500 MG tablet Take 500 mg by mouth every morning.     Marland Kitchen omeprazole (PRILOSEC) 40 MG capsule Take 40 mg by mouth daily.    . polyethylene glycol (MIRALAX / GLYCOLAX) 17 g packet Take 17 g by mouth daily.    . potassium chloride (KLOR-CON) 10 MEQ tablet Take 10 mEq by mouth 2 (two) times daily.    . potassium chloride (MICRO-K) 10 MEQ CR capsule Take by mouth.    . prochlorperazine (COMPAZINE) 10 MG tablet Take 1 tablet (10 mg total) by mouth every 6 (six) hours as needed for nausea or vomiting. 30 tablet 0  . rosuvastatin (CRESTOR) 20 MG tablet Take 20 mg by mouth at bedtime.    . senna-docusate (SENNA-PLUS) 8.6-50 MG tablet Take 2 tablets by mouth 2 (two) times daily as needed for mild constipation.    . sertraline (ZOLOFT) 25 MG tablet Take 25 mg by mouth at bedtime.     Marland Kitchen spironolactone (ALDACTONE) 25 MG tablet Take 25 mg by mouth daily.     . tamsulosin (FLOMAX) 0.4 MG CAPS capsule Take 1 capsule (0.4 mg total) by mouth daily. 90 capsule 3  . vitamin B-12 (CYANOCOBALAMIN) 1000 MCG tablet Take 1,000 mcg by mouth daily.     No current facility-administered medications for this visit.    OBJECTIVE: Vitals:   06/06/20 1017  BP: 113/81  Pulse: 80  Resp: 20  Temp: 97.8 F (36.6 C)  SpO2: 100%     Body mass index is 23.22 kg/m.    ECOG FS:1 - Symptomatic but completely ambulatory  General: Well-developed, well-nourished, no acute distress. Eyes: Pink conjunctiva, anicteric sclera. HEENT: Normocephalic, moist mucous membranes. Lungs: No audible wheezing or coughing. Heart: Regular rate and rhythm. Abdomen: Soft, nontender, no obvious distention. Musculoskeletal: No edema, cyanosis, or clubbing. Neuro: Alert,  answering all questions appropriately. Cranial nerves grossly intact. Skin: No rashes or petechiae noted. Psych: Normal affect.   LAB RESULTS:  Lab Results  Component Value Date   NA 129 (L) 05/30/2020   K 3.6 05/30/2020   CL 92 (L) 05/30/2020   CO2 27 05/30/2020   GLUCOSE 96 05/30/2020   BUN 23 05/30/2020   CREATININE 1.32 (H) 05/30/2020   CALCIUM 9.3 05/30/2020   PROT 6.0 (L) 05/30/2020   ALBUMIN 3.7 05/30/2020   AST 16 05/30/2020   ALT 6 05/30/2020   ALKPHOS 67 05/30/2020   BILITOT 0.6 05/30/2020   GFRNONAA 53 (L) 05/30/2020   GFRAA >60 04/04/2020    Lab Results  Component Value Date   WBC 4.7 05/30/2020   NEUTROABS  2.5 05/30/2020   HGB 10.6 (L) 05/30/2020   HCT 32.9 (L) 05/30/2020   MCV 100.3 (H) 05/30/2020   PLT 77 (L) 05/30/2020     STUDIES: No results found.  ASSESSMENT: Kappa light chain myeloma.  PLAN:    1. Kappa light chain myeloma: Bone marrow biopsy on May 01, 2019 revealed increased plasma cells of 30 to 40% with kappa light chain restriction.  SPEP is negative and IgG, IgA, and IgM are within normal limits. Kappa free light chains are stable ranging from 805.8 to 950.4.  Today's result is 848.2.  Patient feels improved therefore he does not require hospice, but will not reinitiate treatment at this time either.  He last received Revlimid in May 2021. Continue to hold Zometa as well, but patient agreed to reinitiate at next clinic visit.  No further interventions are needed.  Return to clinic in 2 months with repeat laboratory work and further evaluation. Appreciate palliative care input.   2.  Right clavicle fracture: Pathologic.  Resolved.  Patient reports no further interventions are needed by orthopedics. 3.  Hypocalcemia: Resolved. 4.  Iron deficiency anemia: Hemoglobin is improving and is now 10.6.   5.  Thrombocytopenia: Chronic and unchanged.  Patient's platelet count is 77 today.   6.  Leukopenia: Resolved. 7.  Constipation: Recommended OTC  Magnesium citrate.  MiraLAX as needed. 8.  Hypokalemia: Resolved.  Continue oral supplementation.  9.  Dental pain: Patient reports no interventions by his dentist currently. ReinitiateZometa as above. 10. Renal Insufficiency: Creatinine 1.32 today.  Monitor.  Patient expressed understanding and was in agreement with this plan. He also understands that He can call clinic at any time with any questions, concerns, or complaints.    Lloyd Huger, MD   06/08/2020 7:34 AM

## 2020-06-02 ENCOUNTER — Ambulatory Visit (INDEPENDENT_AMBULATORY_CARE_PROVIDER_SITE_OTHER): Payer: Medicare Other | Admitting: Urology

## 2020-06-02 ENCOUNTER — Other Ambulatory Visit: Payer: Self-pay

## 2020-06-02 ENCOUNTER — Encounter: Payer: Self-pay | Admitting: Urology

## 2020-06-02 VITALS — BP 102/68 | HR 81

## 2020-06-02 DIAGNOSIS — N400 Enlarged prostate without lower urinary tract symptoms: Secondary | ICD-10-CM | POA: Diagnosis not present

## 2020-06-02 DIAGNOSIS — N3281 Overactive bladder: Secondary | ICD-10-CM | POA: Diagnosis not present

## 2020-06-02 LAB — KAPPA/LAMBDA LIGHT CHAINS
Kappa free light chain: 848.2 mg/L — ABNORMAL HIGH (ref 3.3–19.4)
Kappa, lambda light chain ratio: 130.49 — ABNORMAL HIGH (ref 0.26–1.65)
Lambda free light chains: 6.5 mg/L (ref 5.7–26.3)

## 2020-06-02 LAB — BLADDER SCAN AMB NON-IMAGING

## 2020-06-02 MED ORDER — FINASTERIDE 5 MG PO TABS: 5.0000 mg | ORAL_TABLET | Freq: Every day | ORAL | 3 refills | Status: AC

## 2020-06-02 MED ORDER — TAMSULOSIN HCL 0.4 MG PO CAPS: 0.4000 mg | ORAL_CAPSULE | Freq: Every day | ORAL | 3 refills | Status: AC

## 2020-06-02 NOTE — Progress Notes (Signed)
   06/02/2020 12:56 PM   Ad Karie Georges 1934-07-23 [id number]  Reason for visit: Follow up urinary symptoms  HPI: I saw [name] and his daughter back in urology clinic for urinary symptoms.  He is an 86 year old extremely comorbid and frail-appearing male with medical history notable for COPD, lymphedema, Parkinson's disease, and multiple myeloma.  I originally saw him in April 2021 for worsening urinary symptoms with urgency, occasional urge incontinence, nocturia, and post void dribbling.  PVR was normal at that visit.    He was previously tried on Myrbetriq without any significant improvement in his urinary symptoms.  He has been on maximal medical therapy with Flomax and finasteride long-term, and prostate measures 47 g on CT from September 2020.  PVR is normal again today at 40 mL.  At our last visit, on exam he had mild meatal stenosis, and this was gently dilated.  He thinks his stream may be a little bit stronger and less spraying than previously.  He continues to take high-dose diuretics.  Overall, he feels like he is doing well, and his urinary symptoms have may be improved some over the last few months.  His primary complaint today is urgency, and he is using urinals throughout the day and night which have prevented previous episodes of urge incontinence.  With his comorbidities and frailty, and high-dose diuretics, I again reviewed that we need to have realistic expectations regarding his urinary symptoms.  It seems like things have stabilized, and overall he is doing well from a urologic perspective.  We discussed return precautions at length including UTIs, gross hematuria, or retention.  Continue Flomax and finasteride RTC 1 year with IPSS and PVR  Billey Co, MD  [facility] [address], [place], [place] [address] [phone]

## 2020-06-03 ENCOUNTER — Encounter
Admission: RE | Admit: 2020-06-03 | Discharge: 2020-06-03 | Disposition: A | Discharge status: Home / Self Care | Payer: Medicare Other | Source: Ambulatory Visit | Attending: Internal Medicine | Admitting: Internal Medicine

## 2020-06-03 DIAGNOSIS — I872 Venous insufficiency (chronic) (peripheral): Secondary | ICD-10-CM | POA: Diagnosis not present

## 2020-06-03 DIAGNOSIS — C9000 Multiple myeloma not having achieved remission: Secondary | ICD-10-CM | POA: Diagnosis not present

## 2020-06-03 DIAGNOSIS — M6281 Muscle weakness (generalized): Secondary | ICD-10-CM | POA: Diagnosis not present

## 2020-06-03 DIAGNOSIS — R600 Localized edema: Secondary | ICD-10-CM | POA: Diagnosis not present

## 2020-06-03 DIAGNOSIS — G20 Parkinson's disease: Secondary | ICD-10-CM | POA: Diagnosis not present

## 2020-06-03 DIAGNOSIS — J410 Simple chronic bronchitis: Secondary | ICD-10-CM | POA: Diagnosis not present

## 2020-06-06 ENCOUNTER — Encounter: Payer: Self-pay | Admitting: Oncology

## 2020-06-06 ENCOUNTER — Inpatient Hospital Stay: Payer: Medicare Other | Attending: Oncology | Admitting: Oncology

## 2020-06-06 ENCOUNTER — Inpatient Hospital Stay (HOSPITAL_BASED_OUTPATIENT_CLINIC_OR_DEPARTMENT_OTHER): Payer: Medicare Other | Admitting: Hospice and Palliative Medicine

## 2020-06-06 VITALS — BP 113/81 | HR 80 | Temp 97.8°F | Resp 20 | Wt 161.8 lb

## 2020-06-06 DIAGNOSIS — S42001A Fracture of unspecified part of right clavicle, initial encounter for closed fracture: Secondary | ICD-10-CM | POA: Diagnosis not present

## 2020-06-06 DIAGNOSIS — D696 Thrombocytopenia, unspecified: Secondary | ICD-10-CM | POA: Diagnosis not present

## 2020-06-06 DIAGNOSIS — Z809 Family history of malignant neoplasm, unspecified: Secondary | ICD-10-CM | POA: Diagnosis not present

## 2020-06-06 DIAGNOSIS — J449 Chronic obstructive pulmonary disease, unspecified: Secondary | ICD-10-CM | POA: Diagnosis not present

## 2020-06-06 DIAGNOSIS — Z8249 Family history of ischemic heart disease and other diseases of the circulatory system: Secondary | ICD-10-CM | POA: Insufficient documentation

## 2020-06-06 DIAGNOSIS — Z9049 Acquired absence of other specified parts of digestive tract: Secondary | ICD-10-CM | POA: Insufficient documentation

## 2020-06-06 DIAGNOSIS — C9000 Multiple myeloma not having achieved remission: Secondary | ICD-10-CM | POA: Diagnosis not present

## 2020-06-06 DIAGNOSIS — D509 Iron deficiency anemia, unspecified: Secondary | ICD-10-CM | POA: Diagnosis not present

## 2020-06-06 DIAGNOSIS — R531 Weakness: Secondary | ICD-10-CM | POA: Insufficient documentation

## 2020-06-06 DIAGNOSIS — G20 Parkinson's disease: Secondary | ICD-10-CM | POA: Diagnosis not present

## 2020-06-06 DIAGNOSIS — K0889 Other specified disorders of teeth and supporting structures: Secondary | ICD-10-CM | POA: Insufficient documentation

## 2020-06-06 DIAGNOSIS — Z79899 Other long term (current) drug therapy: Secondary | ICD-10-CM | POA: Insufficient documentation

## 2020-06-06 DIAGNOSIS — K5900 Constipation, unspecified: Secondary | ICD-10-CM | POA: Insufficient documentation

## 2020-06-06 DIAGNOSIS — Z515 Encounter for palliative care: Secondary | ICD-10-CM | POA: Diagnosis not present

## 2020-06-06 NOTE — Progress Notes (Signed)
[facility]  [phone] [phone]   Name: Adam Moss Date: 06/06/2020 MRN: [id number]  DOB: 1934-05-07  Patient Care Team: Perrin Maltese, MD as PCP - General (Internal Medicine) Lloyd Huger, MD as Consulting Physician (Hematology and Oncology)    REASON FOR CONSULTATION: Adam Moss is a 86 y.o. male with multiple medical problems including multiple myeloma, Parkinson's disease, COPD, venous insufficiency with lower extremity edema, hypertension, and hyperlipidemia.  Patient was hospitalized 02/20/2020-02/25/2020 with weakness after mechanical fall at home.  Patient was found to have a thoracic compression fracture.  Palliative care was consulted help address goals and manage ongoing symptoms.  SOCIAL HISTORY:     reports that he has never smoked. He has never used smokeless tobacco. He reports that he does not drink alcohol and does not use drugs.   Patient is a widower.  He lives at the [facility].  He has a son and daughter who are involved in his care.  Patient has another son who is now deceased.  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  ADVANCE DIRECTIVES:  Not on file  CODE STATUS: DNR  PAST MEDICAL HISTORY: Past Medical History:  Diagnosis Date  . Cancer ([place])    skin  . COPD (chronic obstructive pulmonary disease) ([place])   . Hyperlipemia   . Hypertension   . Lymphedema of both lower extremities   . Parkinson disease ([place])   . Parkinsonism ([place]) 02/21/2015  . Spinal stenosis   . Tremor     PAST SURGICAL HISTORY:  Past Surgical History:  Procedure Laterality Date  . APPENDECTOMY    . CATARACT EXTRACTION    . CHOLECYSTECTOMY    . TONSILLECTOMY    . VASECTOMY      HEMATOLOGY/ONCOLOGY HISTORY:  Oncology History  Multiple myeloma ([place])  05/07/2019 Initial  Diagnosis   Multiple myeloma ([facility])     ALLERGIES:  is allergic to carbidopa-levodopa, oxycodone-acetaminophen, tyloxapol, acetaminophen, and pramipexole.  MEDICATIONS:  Current Outpatient Medications  Medication Sig Dispense Refill  . aspirin 81 MG tablet Take 81 mg by mouth daily.    . Calcium Carbonate-Vitamin D (OYSTER SHELL CALCIUM/D) 250-125 MG-UNIT TABS Take 1 tablet by mouth daily.     . Calcium Citrate 250 MG TABS Take 1 tablet by mouth daily.     . Carbidopa-Levodopa ER (RYTARY) 48.75-195 MG CPCR Take 195 mg by mouth 5 (five) times daily. 450 capsule 3  . cetirizine (ZYRTEC) 10 MG tablet Take 10 mg by mouth daily.    . finasteride (PROSCAR) 5 MG tablet Take 1 tablet (5 mg total) by mouth daily. 90 tablet 3  . fluticasone (FLONASE) 50 MCG/ACT nasal spray Place 1 spray into both nostrils daily as needed for allergies.     . Fluticasone-Umeclidin-Vilant (TRELEGY ELLIPTA) 100-62.5-25 MCG/INH AEPB Inhale 1 spray into the lungs daily.     . furosemide (LASIX) 40 MG tablet Take 40 mg by mouth 2 (two) times daily.    Marland Kitchen ibuprofen (ADVIL) 400 MG tablet Take 400 mg by mouth every 6 (six) hours as needed.    Marland Kitchen ipratropium (ATROVENT) 0.03 % nasal spray Place 0.03 mLs into both nostrils 3 (three) times daily as needed.  11  . lactulose (CHRONULAC) 10 GM/15ML solution Take 10 g by mouth every 12 (twelve) hours as needed for mild constipation.    Marland Kitchen  niacin 500 MG tablet Take 500 mg by mouth every morning.     Marland Kitchen omeprazole (PRILOSEC) 40 MG capsule Take 40 mg by mouth daily.    . polyethylene glycol (MIRALAX / GLYCOLAX) 17 g packet Take 17 g by mouth daily.    . potassium chloride (KLOR-CON) 10 MEQ tablet Take 10 mEq by mouth 2 (two) times daily.    . potassium chloride (MICRO-K) 10 MEQ CR capsule Take by mouth.    . prochlorperazine (COMPAZINE) 10 MG tablet Take 1 tablet (10 mg total) by mouth every 6 (six) hours as needed for nausea or vomiting. 30 tablet 0  . rosuvastatin (CRESTOR) 20 MG tablet  Take 20 mg by mouth at bedtime.    . senna-docusate (SENNA-PLUS) 8.6-50 MG tablet Take 2 tablets by mouth 2 (two) times daily as needed for mild constipation.    . sertraline (ZOLOFT) 25 MG tablet Take 25 mg by mouth at bedtime.     Marland Kitchen spironolactone (ALDACTONE) 25 MG tablet Take 25 mg by mouth daily.     . tamsulosin (FLOMAX) 0.4 MG CAPS capsule Take 1 capsule (0.4 mg total) by mouth daily. 90 capsule 3  . vitamin B-12 (CYANOCOBALAMIN) 1000 MCG tablet Take 1,000 mcg by mouth daily.     No current facility-administered medications for this visit.    VITAL SIGNS: There were no vitals taken for this visit. There were no vitals filed for this visit.  Estimated body mass index is 23.22 kg/m as calculated from the following:   Height as of 03/07/20: '5\' 10"'  (1.778 m).   Weight as of an earlier encounter on 06/06/20: 161 lb 12.8 oz (73.4 kg).  LABS: CBC:    Component Value Date/Time   WBC 4.7 05/30/2020 0700   HGB 10.6 (L) 05/30/2020 0700   HGB 15.8 10/27/2014 0017   HCT 32.9 (L) 05/30/2020 0700   HCT 46.4 10/27/2014 0017   PLT 77 (L) 05/30/2020 0700   PLT 179 10/27/2014 0017   MCV 100.3 (H) 05/30/2020 0700   MCV 90 10/27/2014 0017   NEUTROABS 2.5 05/30/2020 0700   LYMPHSABS 1.6 05/30/2020 0700   MONOABS 0.4 05/30/2020 0700   EOSABS 0.2 05/30/2020 0700   BASOSABS 0.0 05/30/2020 0700   Comprehensive Metabolic Panel:    Component Value Date/Time   NA 129 (L) 05/30/2020 0700   NA 139 10/27/2014 0017   K 3.6 05/30/2020 0700   K 3.5 10/27/2014 0017   CL 92 (L) 05/30/2020 0700   CL 103 10/27/2014 0017   CO2 27 05/30/2020 0700   CO2 28 10/27/2014 0017   BUN 23 05/30/2020 0700   BUN 13 10/27/2014 0017   CREATININE 1.32 (H) 05/30/2020 0700   CREATININE 0.96 10/27/2014 0017   GLUCOSE 96 05/30/2020 0700   GLUCOSE 111 (H) 10/27/2014 0017   CALCIUM 9.3 05/30/2020 0700   CALCIUM 9.2 10/27/2014 0017   AST 16 05/30/2020 0700   ALT 6 05/30/2020 0700   ALKPHOS 67 05/30/2020 0700    BILITOT 0.6 05/30/2020 0700   PROT 6.0 (L) 05/30/2020 0700   ALBUMIN 3.7 05/30/2020 0700    RADIOGRAPHIC STUDIES: No results found.  PERFORMANCE STATUS (ECOG) : 2 - Symptomatic, <50% confined to bed  Review of Systems Unless otherwise noted, a complete review of systems is negative.  Physical Exam General: NAD, frail appearing Pulmonary: unlabored Extremities: + LE edema, no joint deformities Skin: no rashes Neurological: Weakness but otherwise nonfocal  IMPRESSION: I met with patient and his daughter-in-law today.  Patient reports that he is overall doing well.  Since last seen, he has moved from SNF to ALF.  He requires some assistance with ADLs such as bathing but is mostly independent with the rest of his care.  Family plans to speak with ALF staff to determine what resources are available for patient's care.  Patient is interested in pursuing future treatment for his multiple myeloma if needed.  Patient denies any distressing symptoms today.  Plan is for him to return in 8 weeks to see Dr. Grayland Ormond to me.  PLAN: -Continue current scope of treatment -RTC in 2 months  Case and plan discussed with Dr. Grayland Ormond  Patient expressed understanding and was in agreement with this plan. He also understands that He can call the clinic at any time with any questions, concerns, or complaints.     Time Total: 20 minutes  Visit consisted of counseling and education dealing with the complex and emotionally intense issues of symptom management and palliative care in the setting of serious and potentially life-threatening illness.Greater than 50%  of this time was spent counseling and coordinating care related to the above assessment and plan.  Signed by: Altha Harm, PhD, NP-C

## 2020-06-10 DIAGNOSIS — M6281 Muscle weakness (generalized): Secondary | ICD-10-CM | POA: Diagnosis not present

## 2020-06-10 DIAGNOSIS — R600 Localized edema: Secondary | ICD-10-CM | POA: Diagnosis not present

## 2020-06-10 DIAGNOSIS — J410 Simple chronic bronchitis: Secondary | ICD-10-CM | POA: Diagnosis not present

## 2020-06-10 DIAGNOSIS — C9000 Multiple myeloma not having achieved remission: Secondary | ICD-10-CM | POA: Diagnosis not present

## 2020-06-10 DIAGNOSIS — G20 Parkinson's disease: Secondary | ICD-10-CM | POA: Diagnosis not present

## 2020-06-10 DIAGNOSIS — I872 Venous insufficiency (chronic) (peripheral): Secondary | ICD-10-CM | POA: Diagnosis not present

## 2020-06-13 DIAGNOSIS — R600 Localized edema: Secondary | ICD-10-CM | POA: Diagnosis not present

## 2020-06-13 DIAGNOSIS — C9000 Multiple myeloma not having achieved remission: Secondary | ICD-10-CM | POA: Diagnosis not present

## 2020-06-13 DIAGNOSIS — I872 Venous insufficiency (chronic) (peripheral): Secondary | ICD-10-CM | POA: Diagnosis not present

## 2020-06-13 DIAGNOSIS — M6281 Muscle weakness (generalized): Secondary | ICD-10-CM | POA: Diagnosis not present

## 2020-06-13 DIAGNOSIS — J410 Simple chronic bronchitis: Secondary | ICD-10-CM | POA: Diagnosis not present

## 2020-06-13 DIAGNOSIS — G20 Parkinson's disease: Secondary | ICD-10-CM | POA: Diagnosis not present

## 2020-06-16 ENCOUNTER — Ambulatory Visit: Payer: Self-pay | Admitting: Urology

## 2020-06-16 DIAGNOSIS — C9000 Multiple myeloma not having achieved remission: Secondary | ICD-10-CM | POA: Diagnosis not present

## 2020-06-16 DIAGNOSIS — M6281 Muscle weakness (generalized): Secondary | ICD-10-CM | POA: Diagnosis not present

## 2020-06-16 DIAGNOSIS — R600 Localized edema: Secondary | ICD-10-CM | POA: Diagnosis not present

## 2020-06-16 DIAGNOSIS — I872 Venous insufficiency (chronic) (peripheral): Secondary | ICD-10-CM | POA: Diagnosis not present

## 2020-06-16 DIAGNOSIS — G20 Parkinson's disease: Secondary | ICD-10-CM | POA: Diagnosis not present

## 2020-06-16 DIAGNOSIS — J410 Simple chronic bronchitis: Secondary | ICD-10-CM | POA: Diagnosis not present

## 2020-06-23 DIAGNOSIS — L853 Xerosis cutis: Secondary | ICD-10-CM | POA: Diagnosis not present

## 2020-06-23 DIAGNOSIS — Z85068 Personal history of other malignant neoplasm of small intestine: Secondary | ICD-10-CM | POA: Diagnosis not present

## 2020-06-23 DIAGNOSIS — L570 Actinic keratosis: Secondary | ICD-10-CM | POA: Diagnosis not present

## 2020-06-25 DIAGNOSIS — M6281 Muscle weakness (generalized): Secondary | ICD-10-CM | POA: Diagnosis not present

## 2020-06-25 DIAGNOSIS — Z87440 Personal history of urinary (tract) infections: Secondary | ICD-10-CM | POA: Diagnosis not present

## 2020-06-25 DIAGNOSIS — I872 Venous insufficiency (chronic) (peripheral): Secondary | ICD-10-CM | POA: Diagnosis not present

## 2020-06-25 DIAGNOSIS — C9000 Multiple myeloma not having achieved remission: Secondary | ICD-10-CM | POA: Diagnosis not present

## 2020-06-25 DIAGNOSIS — J410 Simple chronic bronchitis: Secondary | ICD-10-CM | POA: Diagnosis not present

## 2020-06-25 DIAGNOSIS — G20 Parkinson's disease: Secondary | ICD-10-CM | POA: Diagnosis not present

## 2020-06-25 DIAGNOSIS — Z9181 History of falling: Secondary | ICD-10-CM | POA: Diagnosis not present

## 2020-06-25 DIAGNOSIS — R600 Localized edema: Secondary | ICD-10-CM | POA: Diagnosis not present

## 2020-06-25 DIAGNOSIS — R279 Unspecified lack of coordination: Secondary | ICD-10-CM | POA: Diagnosis not present

## 2020-06-25 DIAGNOSIS — N400 Enlarged prostate without lower urinary tract symptoms: Secondary | ICD-10-CM | POA: Diagnosis not present

## 2020-06-25 DIAGNOSIS — K2100 Gastro-esophageal reflux disease with esophagitis, without bleeding: Secondary | ICD-10-CM | POA: Diagnosis not present

## 2020-06-25 DIAGNOSIS — R262 Difficulty in walking, not elsewhere classified: Secondary | ICD-10-CM | POA: Diagnosis not present

## 2020-06-25 DIAGNOSIS — J302 Other seasonal allergic rhinitis: Secondary | ICD-10-CM | POA: Diagnosis not present

## 2020-06-25 DIAGNOSIS — E785 Hyperlipidemia, unspecified: Secondary | ICD-10-CM | POA: Diagnosis not present

## 2020-06-25 DIAGNOSIS — K5900 Constipation, unspecified: Secondary | ICD-10-CM | POA: Diagnosis not present

## 2020-07-01 DIAGNOSIS — G20 Parkinson's disease: Secondary | ICD-10-CM | POA: Diagnosis not present

## 2020-07-01 DIAGNOSIS — M6281 Muscle weakness (generalized): Secondary | ICD-10-CM | POA: Diagnosis not present

## 2020-07-01 DIAGNOSIS — J410 Simple chronic bronchitis: Secondary | ICD-10-CM | POA: Diagnosis not present

## 2020-07-01 DIAGNOSIS — C9000 Multiple myeloma not having achieved remission: Secondary | ICD-10-CM | POA: Diagnosis not present

## 2020-07-01 DIAGNOSIS — I872 Venous insufficiency (chronic) (peripheral): Secondary | ICD-10-CM | POA: Diagnosis not present

## 2020-07-01 DIAGNOSIS — R600 Localized edema: Secondary | ICD-10-CM | POA: Diagnosis not present

## 2020-07-03 DIAGNOSIS — G20 Parkinson's disease: Secondary | ICD-10-CM | POA: Diagnosis not present

## 2020-07-03 DIAGNOSIS — J410 Simple chronic bronchitis: Secondary | ICD-10-CM | POA: Diagnosis not present

## 2020-07-03 DIAGNOSIS — R600 Localized edema: Secondary | ICD-10-CM | POA: Diagnosis not present

## 2020-07-03 DIAGNOSIS — C9000 Multiple myeloma not having achieved remission: Secondary | ICD-10-CM | POA: Diagnosis not present

## 2020-07-03 DIAGNOSIS — I872 Venous insufficiency (chronic) (peripheral): Secondary | ICD-10-CM | POA: Diagnosis not present

## 2020-07-03 DIAGNOSIS — M6281 Muscle weakness (generalized): Secondary | ICD-10-CM | POA: Diagnosis not present

## 2020-07-07 DIAGNOSIS — J410 Simple chronic bronchitis: Secondary | ICD-10-CM | POA: Diagnosis not present

## 2020-07-07 DIAGNOSIS — I872 Venous insufficiency (chronic) (peripheral): Secondary | ICD-10-CM | POA: Diagnosis not present

## 2020-07-07 DIAGNOSIS — M6281 Muscle weakness (generalized): Secondary | ICD-10-CM | POA: Diagnosis not present

## 2020-07-07 DIAGNOSIS — R600 Localized edema: Secondary | ICD-10-CM | POA: Diagnosis not present

## 2020-07-07 DIAGNOSIS — C9000 Multiple myeloma not having achieved remission: Secondary | ICD-10-CM | POA: Diagnosis not present

## 2020-07-07 DIAGNOSIS — G20 Parkinson's disease: Secondary | ICD-10-CM | POA: Diagnosis not present

## 2020-07-16 DIAGNOSIS — I872 Venous insufficiency (chronic) (peripheral): Secondary | ICD-10-CM | POA: Diagnosis not present

## 2020-07-16 DIAGNOSIS — M6281 Muscle weakness (generalized): Secondary | ICD-10-CM | POA: Diagnosis not present

## 2020-07-16 DIAGNOSIS — C9000 Multiple myeloma not having achieved remission: Secondary | ICD-10-CM | POA: Diagnosis not present

## 2020-07-16 DIAGNOSIS — J410 Simple chronic bronchitis: Secondary | ICD-10-CM | POA: Diagnosis not present

## 2020-07-16 DIAGNOSIS — R600 Localized edema: Secondary | ICD-10-CM | POA: Diagnosis not present

## 2020-07-16 DIAGNOSIS — G20 Parkinson's disease: Secondary | ICD-10-CM | POA: Diagnosis not present

## 2020-07-17 DIAGNOSIS — C9000 Multiple myeloma not having achieved remission: Secondary | ICD-10-CM | POA: Diagnosis not present

## 2020-07-17 DIAGNOSIS — M6281 Muscle weakness (generalized): Secondary | ICD-10-CM | POA: Diagnosis not present

## 2020-07-17 DIAGNOSIS — G20 Parkinson's disease: Secondary | ICD-10-CM | POA: Diagnosis not present

## 2020-07-17 DIAGNOSIS — J410 Simple chronic bronchitis: Secondary | ICD-10-CM | POA: Diagnosis not present

## 2020-07-17 DIAGNOSIS — I872 Venous insufficiency (chronic) (peripheral): Secondary | ICD-10-CM | POA: Diagnosis not present

## 2020-07-17 DIAGNOSIS — R600 Localized edema: Secondary | ICD-10-CM | POA: Diagnosis not present

## 2020-07-18 DIAGNOSIS — M6281 Muscle weakness (generalized): Secondary | ICD-10-CM | POA: Diagnosis not present

## 2020-07-18 DIAGNOSIS — C9000 Multiple myeloma not having achieved remission: Secondary | ICD-10-CM | POA: Diagnosis not present

## 2020-07-18 DIAGNOSIS — G20 Parkinson's disease: Secondary | ICD-10-CM | POA: Diagnosis not present

## 2020-07-18 DIAGNOSIS — J410 Simple chronic bronchitis: Secondary | ICD-10-CM | POA: Diagnosis not present

## 2020-07-18 DIAGNOSIS — R600 Localized edema: Secondary | ICD-10-CM | POA: Diagnosis not present

## 2020-07-18 DIAGNOSIS — I872 Venous insufficiency (chronic) (peripheral): Secondary | ICD-10-CM | POA: Diagnosis not present

## 2020-07-22 DIAGNOSIS — C9000 Multiple myeloma not having achieved remission: Secondary | ICD-10-CM | POA: Diagnosis not present

## 2020-07-22 DIAGNOSIS — G20 Parkinson's disease: Secondary | ICD-10-CM | POA: Diagnosis not present

## 2020-07-22 DIAGNOSIS — I872 Venous insufficiency (chronic) (peripheral): Secondary | ICD-10-CM | POA: Diagnosis not present

## 2020-07-22 DIAGNOSIS — M6281 Muscle weakness (generalized): Secondary | ICD-10-CM | POA: Diagnosis not present

## 2020-07-22 DIAGNOSIS — R600 Localized edema: Secondary | ICD-10-CM | POA: Diagnosis not present

## 2020-07-22 DIAGNOSIS — J410 Simple chronic bronchitis: Secondary | ICD-10-CM | POA: Diagnosis not present

## 2020-07-23 DIAGNOSIS — G20 Parkinson's disease: Secondary | ICD-10-CM | POA: Diagnosis not present

## 2020-07-23 DIAGNOSIS — C9000 Multiple myeloma not having achieved remission: Secondary | ICD-10-CM | POA: Diagnosis not present

## 2020-07-23 DIAGNOSIS — I872 Venous insufficiency (chronic) (peripheral): Secondary | ICD-10-CM | POA: Diagnosis not present

## 2020-07-23 DIAGNOSIS — R600 Localized edema: Secondary | ICD-10-CM | POA: Diagnosis not present

## 2020-07-23 DIAGNOSIS — M6281 Muscle weakness (generalized): Secondary | ICD-10-CM | POA: Diagnosis not present

## 2020-07-23 DIAGNOSIS — J410 Simple chronic bronchitis: Secondary | ICD-10-CM | POA: Diagnosis not present

## 2020-07-24 ENCOUNTER — Other Ambulatory Visit
Admission: RE | Admit: 2020-07-24 | Discharge: 2020-07-24 | Disposition: A | Discharge status: Home / Self Care | Payer: Medicare Other | Source: Skilled Nursing Facility | Attending: Internal Medicine | Admitting: Internal Medicine

## 2020-07-24 DIAGNOSIS — C9000 Multiple myeloma not having achieved remission: Secondary | ICD-10-CM | POA: Insufficient documentation

## 2020-07-24 LAB — CBC WITH DIFFERENTIAL/PLATELET
Abs Immature Granulocytes: 0.03 10*3/uL (ref 0.00–0.07)
Basophils Absolute: 0 10*3/uL (ref 0.0–0.1)
Basophils Relative: 0 %
Eosinophils Absolute: 0.1 10*3/uL (ref 0.0–0.5)
Eosinophils Relative: 1 %
HCT: 32.6 % — ABNORMAL LOW (ref 39.0–52.0)
Hemoglobin: 10.7 g/dL — ABNORMAL LOW (ref 13.0–17.0)
Immature Granulocytes: 1 %
Lymphocytes Relative: 38 %
Lymphs Abs: 1.6 10*3/uL (ref 0.7–4.0)
MCH: 31.8 pg (ref 26.0–34.0)
MCHC: 32.8 g/dL (ref 30.0–36.0)
MCV: 97 fL (ref 80.0–100.0)
Monocytes Absolute: 0.5 10*3/uL (ref 0.1–1.0)
Monocytes Relative: 12 %
Neutro Abs: 2 10*3/uL (ref 1.7–7.7)
Neutrophils Relative %: 48 %
Platelets: 71 10*3/uL — ABNORMAL LOW (ref 150–400)
RBC: 3.36 MIL/uL — ABNORMAL LOW (ref 4.22–5.81)
RDW: 16.2 % — ABNORMAL HIGH (ref 11.5–15.5)
WBC: 4.2 10*3/uL (ref 4.0–10.5)
nRBC: 0 % (ref 0.0–0.2)

## 2020-07-24 LAB — COMPREHENSIVE METABOLIC PANEL
ALT: 6 U/L (ref 0–44)
AST: 17 U/L (ref 15–41)
Albumin: 3.6 g/dL (ref 3.5–5.0)
Alkaline Phosphatase: 56 U/L (ref 38–126)
Anion gap: 10 (ref 5–15)
BUN: 23 mg/dL (ref 8–23)
CO2: 27 mmol/L (ref 22–32)
Calcium: 9.3 mg/dL (ref 8.9–10.3)
Chloride: 95 mmol/L — ABNORMAL LOW (ref 98–111)
Creatinine, Ser: 0.93 mg/dL (ref 0.61–1.24)
GFR, Estimated: >60 mL/min (ref >60)
Glucose, Bld: 93 mg/dL (ref 70–99)
Potassium: 4.2 mmol/L (ref 3.5–5.1)
Sodium: 132 mmol/L — ABNORMAL LOW (ref 135–145)
Total Bilirubin: 0.7 mg/dL (ref 0.3–1.2)
Total Protein: 5.9 g/dL — ABNORMAL LOW (ref 6.5–8.1)

## 2020-07-25 LAB — IGG, IGA, IGM
IgA: 61 mg/dL (ref 61–437)
IgG (Immunoglobin G), Serum: 539 mg/dL — ABNORMAL LOW (ref 603–1613)
IgM (Immunoglobulin M), Srm: 21 mg/dL (ref 15–143)

## 2020-07-28 LAB — KAPPA/LAMBDA LIGHT CHAINS
Kappa free light chain: 994 mg/L — ABNORMAL HIGH (ref 3.3–19.4)
Kappa, lambda light chain ratio: 162.95 — ABNORMAL HIGH (ref 0.26–1.65)
Lambda free light chains: 6.1 mg/L (ref 5.7–26.3)

## 2020-08-01 ENCOUNTER — Inpatient Hospital Stay: Payer: Medicare Other | Admitting: Oncology

## 2020-08-01 ENCOUNTER — Other Ambulatory Visit: Payer: Self-pay

## 2020-08-01 ENCOUNTER — Inpatient Hospital Stay (HOSPITAL_BASED_OUTPATIENT_CLINIC_OR_DEPARTMENT_OTHER): Payer: Medicare Other | Admitting: Hospice and Palliative Medicine

## 2020-08-01 ENCOUNTER — Encounter: Payer: Self-pay | Admitting: Oncology

## 2020-08-01 ENCOUNTER — Inpatient Hospital Stay: Payer: Medicare Other | Attending: Oncology

## 2020-08-01 VITALS — BP 122/85 | HR 90 | Temp 96.0°F

## 2020-08-01 DIAGNOSIS — Z9049 Acquired absence of other specified parts of digestive tract: Secondary | ICD-10-CM | POA: Diagnosis not present

## 2020-08-01 DIAGNOSIS — I872 Venous insufficiency (chronic) (peripheral): Secondary | ICD-10-CM | POA: Insufficient documentation

## 2020-08-01 DIAGNOSIS — Z515 Encounter for palliative care: Secondary | ICD-10-CM

## 2020-08-01 DIAGNOSIS — R600 Localized edema: Secondary | ICD-10-CM | POA: Insufficient documentation

## 2020-08-01 DIAGNOSIS — I10 Essential (primary) hypertension: Secondary | ICD-10-CM | POA: Diagnosis not present

## 2020-08-01 DIAGNOSIS — C9000 Multiple myeloma not having achieved remission: Secondary | ICD-10-CM | POA: Insufficient documentation

## 2020-08-01 DIAGNOSIS — Z809 Family history of malignant neoplasm, unspecified: Secondary | ICD-10-CM | POA: Insufficient documentation

## 2020-08-01 DIAGNOSIS — Z79899 Other long term (current) drug therapy: Secondary | ICD-10-CM | POA: Diagnosis not present

## 2020-08-01 DIAGNOSIS — D509 Iron deficiency anemia, unspecified: Secondary | ICD-10-CM | POA: Insufficient documentation

## 2020-08-01 DIAGNOSIS — G20 Parkinson's disease: Secondary | ICD-10-CM | POA: Diagnosis not present

## 2020-08-01 DIAGNOSIS — E785 Hyperlipidemia, unspecified: Secondary | ICD-10-CM | POA: Diagnosis not present

## 2020-08-01 DIAGNOSIS — J449 Chronic obstructive pulmonary disease, unspecified: Secondary | ICD-10-CM | POA: Diagnosis not present

## 2020-08-01 DIAGNOSIS — Z8249 Family history of ischemic heart disease and other diseases of the circulatory system: Secondary | ICD-10-CM | POA: Insufficient documentation

## 2020-08-01 MED ORDER — ZOLEDRONIC ACID 4 MG/100ML IV SOLN
4.0000 mg | Freq: Once | INTRAVENOUS | Status: AC
  Administered 2020-08-01: 4 mg via INTRAVENOUS
  Filled 2020-08-01: qty 100

## 2020-08-01 MED ORDER — SODIUM CHLORIDE 0.9 % IV SOLN
Freq: Once | INTRAVENOUS | Status: AC
  Filled 2020-08-01: qty 250

## 2020-08-01 NOTE — Progress Notes (Signed)
[place]  Telephone:([phone] [phone]   Name: Adam Moss Date: 08/01/2020 MRN: [id number]  DOB: 11-22-1933  Patient Care Team: Perrin Maltese, MD as PCP - General (Internal Medicine) Lloyd Huger, MD as Consulting Physician (Hematology and Oncology)    REASON FOR CONSULTATION: Adam Moss is a 87 y.o. male with multiple medical problems including multiple myeloma, Parkinson's disease, COPD, venous insufficiency with lower extremity edema, hypertension, and hyperlipidemia.  Patient was hospitalized 02/20/2020-02/25/2020 with weakness after mechanical fall at home.  Patient was found to have a thoracic compression fracture.  Palliative care was consulted help address goals and manage ongoing symptoms.  SOCIAL HISTORY:     reports that he has never smoked. He has never used smokeless tobacco. He reports that he does not drink alcohol and does not use drugs.   Patient is a widower.  He lives at the [facility].  He has a son and daughter who are involved in his care.  Patient has another son who is now deceased.  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  ADVANCE DIRECTIVES:  Not on file  CODE STATUS: DNR  PAST MEDICAL HISTORY: Past Medical History:  Diagnosis Date  . Cancer ([place])    skin  . COPD (chronic obstructive pulmonary disease) ([place])   . Hyperlipemia   . Hypertension   . Lymphedema of both lower extremities   . Parkinson disease ([place])   . Parkinsonism ([place]) 02/21/2015  . Spinal stenosis   . Tremor     PAST SURGICAL HISTORY:  Past Surgical History:  Procedure Laterality Date  . APPENDECTOMY    . CATARACT EXTRACTION    . CHOLECYSTECTOMY    . TONSILLECTOMY    . VASECTOMY      HEMATOLOGY/ONCOLOGY HISTORY:  Oncology History  Multiple myeloma ([place])  05/07/2019 Initial  Diagnosis   Multiple myeloma (HCC)     ALLERGIES:  is allergic to carbidopa-levodopa, oxycodone-acetaminophen, tyloxapol, acetaminophen, and pramipexole.  MEDICATIONS:  Current Outpatient Medications  Medication Sig Dispense Refill  . aspirin 81 MG tablet Take 81 mg by mouth daily.    . Calcium Carbonate-Vitamin D (OYSTER SHELL CALCIUM/D) 250-125 MG-UNIT TABS Take 1 tablet by mouth daily.     . Calcium Citrate 250 MG TABS Take 1 tablet by mouth daily.     . Carbidopa-Levodopa ER (RYTARY) 48.75-195 MG CPCR Take 195 mg by mouth 5 (five) times daily. 450 capsule 3  . cetirizine (ZYRTEC) 10 MG tablet Take 10 mg by mouth daily.    . finasteride (PROSCAR) 5 MG tablet Take 1 tablet (5 mg total) by mouth daily. 90 tablet 3  . fluticasone (FLONASE) 50 MCG/ACT nasal spray Place 1 spray into both nostrils daily as needed for allergies.     . Fluticasone-Umeclidin-Vilant (TRELEGY ELLIPTA) 100-62.5-25 MCG/INH AEPB Inhale 1 spray into the lungs daily.     . furosemide (LASIX) 40 MG tablet Take 40 mg by mouth 2 (two) times daily.    Marland Kitchen ibuprofen (ADVIL) 400 MG tablet Take 400 mg by mouth every 6 (six) hours as needed.    Marland Kitchen ipratropium (ATROVENT) 0.03 % nasal spray Place 0.03 mLs into both nostrils 3 (three) times daily as needed.  11  . lactulose (CHRONULAC) 10 GM/15ML solution Take 10 g by mouth every 12 (twelve) hours as needed for mild constipation.    Marland Kitchen  niacin 500 MG tablet Take 500 mg by mouth every morning.     Marland Kitchen omeprazole (PRILOSEC) 40 MG capsule Take 40 mg by mouth daily.    . polyethylene glycol (MIRALAX / GLYCOLAX) 17 g packet Take 17 g by mouth daily.    . potassium chloride (KLOR-CON) 10 MEQ tablet Take 10 mEq by mouth 2 (two) times daily.    . potassium chloride (MICRO-K) 10 MEQ CR capsule Take by mouth.    . prochlorperazine (COMPAZINE) 10 MG tablet Take 1 tablet (10 mg total) by mouth every 6 (six) hours as needed for nausea or vomiting. 30 tablet 0  . rosuvastatin (CRESTOR) 20 MG tablet  Take 20 mg by mouth at bedtime.    . senna-docusate (SENOKOT-S) 8.6-50 MG tablet Take 2 tablets by mouth 2 (two) times daily as needed for mild constipation.    . sertraline (ZOLOFT) 25 MG tablet Take 25 mg by mouth at bedtime.     Marland Kitchen spironolactone (ALDACTONE) 25 MG tablet Take 25 mg by mouth daily.     . tamsulosin (FLOMAX) 0.4 MG CAPS capsule Take 1 capsule (0.4 mg total) by mouth daily. 90 capsule 3  . vitamin B-12 (CYANOCOBALAMIN) 1000 MCG tablet Take 1,000 mcg by mouth daily.     No current facility-administered medications for this visit.    VITAL SIGNS: There were no vitals taken for this visit. There were no vitals filed for this visit.  Estimated body mass index is 23.24 kg/m as calculated from the following:   Height as of 03/07/20: '5\' 10"'  (1.778 m).   Weight as of an earlier encounter on 08/01/20: 162 lb (73.5 kg).  LABS: CBC:    Component Value Date/Time   WBC 4.2 07/24/2020 0700   HGB 10.7 (L) 07/24/2020 0700   HGB 15.8 10/27/2014 0017   HCT 32.6 (L) 07/24/2020 0700   HCT 46.4 10/27/2014 0017   PLT 71 (L) 07/24/2020 0700   PLT 179 10/27/2014 0017   MCV 97.0 07/24/2020 0700   MCV 90 10/27/2014 0017   NEUTROABS 2.0 07/24/2020 0700   LYMPHSABS 1.6 07/24/2020 0700   MONOABS 0.5 07/24/2020 0700   EOSABS 0.1 07/24/2020 0700   BASOSABS 0.0 07/24/2020 0700   Comprehensive Metabolic Panel:    Component Value Date/Time   NA 132 (L) 07/24/2020 0700   NA 139 10/27/2014 0017   K 4.2 07/24/2020 0700   K 3.5 10/27/2014 0017   CL 95 (L) 07/24/2020 0700   CL 103 10/27/2014 0017   CO2 27 07/24/2020 0700   CO2 28 10/27/2014 0017   BUN 23 07/24/2020 0700   BUN 13 10/27/2014 0017   CREATININE 0.93 07/24/2020 0700   CREATININE 0.96 10/27/2014 0017   GLUCOSE 93 07/24/2020 0700   GLUCOSE 111 (H) 10/27/2014 0017   CALCIUM 9.3 07/24/2020 0700   CALCIUM 9.2 10/27/2014 0017   AST 17 07/24/2020 0700   ALT 6 07/24/2020 0700   ALKPHOS 56 07/24/2020 0700   BILITOT 0.7 07/24/2020  0700   PROT 5.9 (L) 07/24/2020 0700   ALBUMIN 3.6 07/24/2020 0700    RADIOGRAPHIC STUDIES: No results found.  PERFORMANCE STATUS (ECOG) : 2 - Symptomatic, <50% confined to bed  Review of Systems Unless otherwise noted, a complete review of systems is negative.  Physical Exam General: NAD, frail appearing Pulmonary: unlabored Extremities: + LE edema, no joint deformities Skin: no rashes Neurological: Weakness but otherwise nonfocal  IMPRESSION: Routine follow-up visit today.  Reviewed labs with patient.  Kappa free  light uptrending slightly.  Discussed with Dr. Grayland Ormond.  Plan is that better today in 1 month follow-up.  Recheck labs.  Overall, patient declines any significant changes or concerns.  No symptomatic complaints at present.  His oral intake is chronically poor.  Weight is stable.  Recommended adding oral nutritional supplements daily.  PLAN: -Continue current scope of treatment -Zometa today per Dr. Grayland Ormond -Add Ensure daily -RTC in 1 month to see Dr. Lujean Rave and repeat labs  Case and plan discussed with Dr. Grayland Ormond  Patient expressed understanding and was in agreement with this plan. He also understands that He can call the clinic at any time with any questions, concerns, or complaints.    Time Total: 15 minutes  Visit consisted of counseling and education dealing with the complex and emotionally intense issues of symptom management and palliative care in the setting of serious and potentially life-threatening illness.Greater than 50%  of this time was spent counseling and coordinating care related to the above assessment and plan.  Signed by: Altha Harm, PhD, NP-C

## 2020-08-01 NOTE — Progress Notes (Signed)
Pt tolerated infusion well with no complications. VSS. Pt stable for discharge.   Adam Moss  

## 2020-08-01 NOTE — Progress Notes (Signed)
Error

## 2020-08-11 ENCOUNTER — Ambulatory Visit: Payer: Medicare Other | Admitting: Neurology

## 2020-08-11 DIAGNOSIS — Z593 Problems related to living in residential institution: Secondary | ICD-10-CM | POA: Insufficient documentation

## 2020-08-12 ENCOUNTER — Telehealth: Payer: Self-pay

## 2020-08-12 NOTE — Telephone Encounter (Signed)
I called patient.  We rescheduled his appointment from yesterday that was missed due to weather.  He is agreeable to an appointment on October 01, 2020 at 9:30 AM.  Patient would like to be placed on the wait list for sooner appointment.  Patient will need at least 48 hours worth of notice prior to his appointments because he lives in a nursing home.

## 2020-08-20 ENCOUNTER — Other Ambulatory Visit: Payer: Self-pay

## 2020-08-20 ENCOUNTER — Non-Acute Institutional Stay: Payer: Medicare Other | Admitting: Adult Health Nurse Practitioner

## 2020-08-20 DIAGNOSIS — G20 Parkinson's disease: Secondary | ICD-10-CM

## 2020-08-20 DIAGNOSIS — G20A1 Parkinson's disease without dyskinesia, without mention of fluctuations: Secondary | ICD-10-CM

## 2020-08-20 DIAGNOSIS — Z515 Encounter for palliative care: Secondary | ICD-10-CM

## 2020-08-20 DIAGNOSIS — R0989 Other specified symptoms and signs involving the circulatory and respiratory systems: Secondary | ICD-10-CM

## 2020-08-20 DIAGNOSIS — R682 Dry mouth, unspecified: Secondary | ICD-10-CM

## 2020-08-20 NOTE — Progress Notes (Signed)
[facility] Consult Note Telephone: [phone]  Fax: [phone]  PATIENT NAME: Adam Moss DOB: 1933/08/16 MRN: [id number]  PRIMARY CARE PROVIDER:   Dr. Frazier Richards  REFERRING PROVIDER:  Dr. Frazier Richards  RESPONSIBLE PARTY:   Binnie Kand, son [phone]   Chief complaint:  Follow up palliative visit/dry mouth    RECOMMENDATIONS and PLAN:  1.  Advanced care planning.  Patient is DNR.  Will call son to update on visit  2. Parkinson's.  Patient state that he has more shuffling gait some days.  He is on Rytary 48.75/195 five times a day.  States that neurology does not want to up on this dosing.  Continue follow up and recommendations by neurology  3.  Dry mouth.  Could be a symptom of Parkinson's or medication.  Ordered Biotene 20 mg swish in mouth for 30 seconds and spit out TID  4.  Runny nose.  Has developed into post nasal drip.  Has flonase PRN but has not been using this.  Ordered flonase to be scheduled for next 7 days.    Palliative will continue to monitor for symptom management/decline and make recommendations as needed.  Will follow up in 6-8 weeks.   I spent 40 minutes providing this consultation. More than 50% of the time in this consultation was spent coordinating communication.   HISTORY OF PRESENT ILLNESS:  Adam Moss is a 87 y.o. year old male with multiple medical problems including Parkinson's, anascara, COPD, HTN, HLD, venous insufficiency. Palliative Care was asked to help address goals of care. Patient has transitioned from SNF to ALF at the [facility] [place].  Appetite has been good though complains about the food.  Weight stable around 157.  Patient is no longer on treatment for multiple myeloma and states that his BMs have been more regular since stopping the Revlimid.  Complains of dry mouth that has been going on for a long time.  He does try to drink plenty of water.  States that  he has had a runny nose which has now turned into post nasal drainage.  Has taken ipratropium bromide nasal spray without much relief.  Has flonase PRN but has not tried this. Denies fever, cough, SOB, sore throat, headaches.  States that he has a more shuffling gait and that it is getting harder to get out of his chair.  He is to start working with PT 2 days a week instead of one.  States that he is more motivated to exercise when working with PT.  Gets occasional back pain that is relieved with PRN ibuprofen. Rest of 10 point ROS asked and negative   CODE STATUS: DNR  PPS: 40% HOSPICE ELIGIBILITY/DIAGNOSIS: TBD  PHYSICAL EXAM:  HR 70 O2 98% on RA  General: NAD, frail appearing, thin Cardiovascular: regular rate and rhythm Pulmonary: lung sounds clear; normal respiratory effort Abdomen: soft, nontender, + bowel sounds GU: no suprapubic tenderness Extremities: 1+ edema to bilateral feet, no joint deformities Skin: no rashes on exposed skin Neurological: Weakness but otherwise nonfocal  PAST MEDICAL HISTORY:  Past Medical History:  Diagnosis Date  . Cancer ([place])    skin  . COPD (chronic obstructive pulmonary disease) ([place])   . Hyperlipemia   . Hypertension   . Lymphedema of both lower extremities   . Parkinson disease ([place])   . Parkinsonism ([place]) 02/21/2015  . Spinal stenosis   . Tremor     SOCIAL HX:  Social History  Tobacco Use  . Smoking status: Never Smoker  . Smokeless tobacco: Never Used  Substance Use Topics  . Alcohol use: No    Alcohol/week: 0.0 standard drinks    ALLERGIES:  Allergies  Allergen Reactions  . Carbidopa-Levodopa Other (See Comments)    Severe stomach pains, can take rytary  . Oxycodone-Acetaminophen Other (See Comments)    "boils on skin"  . Tyloxapol   . Acetaminophen Rash, Nausea And Vomiting and Hives  . Pramipexole Nausea Only     PERTINENT MEDICATIONS:  Outpatient Encounter Medications as of 08/20/2020  Medication Sig  . aspirin 81  MG tablet Take 81 mg by mouth daily.  . Calcium Carbonate-Vitamin D (OYSTER SHELL CALCIUM/D) 250-125 MG-UNIT TABS Take 1 tablet by mouth daily.   . Calcium Citrate 250 MG TABS Take 1 tablet by mouth daily.   . Carbidopa-Levodopa ER (RYTARY) 48.75-195 MG CPCR Take 195 mg by mouth 5 (five) times daily.  . cetirizine (ZYRTEC) 10 MG tablet Take 10 mg by mouth daily.  . finasteride (PROSCAR) 5 MG tablet Take 1 tablet (5 mg total) by mouth daily.  . fluticasone (FLONASE) 50 MCG/ACT nasal spray Place 1 spray into both nostrils daily as needed for allergies.   . Fluticasone-Umeclidin-Vilant (TRELEGY ELLIPTA) 100-62.5-25 MCG/INH AEPB Inhale 1 spray into the lungs daily.   . furosemide (LASIX) 40 MG tablet Take 40 mg by mouth 2 (two) times daily.  Marland Kitchen ibuprofen (ADVIL) 400 MG tablet Take 400 mg by mouth every 6 (six) hours as needed.  Marland Kitchen ipratropium (ATROVENT) 0.03 % nasal spray Place 0.03 mLs into both nostrils 3 (three) times daily as needed.  . lactulose (CHRONULAC) 10 GM/15ML solution Take 10 g by mouth every 12 (twelve) hours as needed for mild constipation.  . niacin 500 MG tablet Take 500 mg by mouth every morning.   Marland Kitchen omeprazole (PRILOSEC) 40 MG capsule Take 40 mg by mouth daily.  . polyethylene glycol (MIRALAX / GLYCOLAX) 17 g packet Take 17 g by mouth daily.  . potassium chloride (KLOR-CON) 10 MEQ tablet Take 10 mEq by mouth 2 (two) times daily.  . potassium chloride (MICRO-K) 10 MEQ CR capsule Take by mouth.  . prochlorperazine (COMPAZINE) 10 MG tablet Take 1 tablet (10 mg total) by mouth every 6 (six) hours as needed for nausea or vomiting.  . rosuvastatin (CRESTOR) 20 MG tablet Take 20 mg by mouth at bedtime.  . senna-docusate (SENOKOT-S) 8.6-50 MG tablet Take 2 tablets by mouth 2 (two) times daily as needed for mild constipation.  . sertraline (ZOLOFT) 25 MG tablet Take 25 mg by mouth at bedtime.   Marland Kitchen spironolactone (ALDACTONE) 25 MG tablet Take 25 mg by mouth daily.   . tamsulosin (FLOMAX) 0.4  MG CAPS capsule Take 1 capsule (0.4 mg total) by mouth daily.  . vitamin B-12 (CYANOCOBALAMIN) 1000 MCG tablet Take 1,000 mcg by mouth daily.   No facility-administered encounter medications on file as of 08/20/2020.    PHYSICAL EXAM:   General: NAD, frail appearing, thin Cardiovascular: regular rate and rhythm Pulmonary: clear ant fields Abdomen: soft, nontender, + bowel sounds GU: no suprapubic tenderness Extremities: no edema, no joint deformities Skin: no rashes Neurological: Weakness but otherwise nonfocal  [place] Marchiano Jenetta Downer, NP

## 2020-08-21 ENCOUNTER — Telehealth: Payer: Self-pay | Admitting: Adult Health Nurse Practitioner

## 2020-08-21 NOTE — Telephone Encounter (Signed)
Spoke with son to update on visit with his father yesterday.  He has no new concerns at this time.  Encouraged to call with any questions or concerns. Kagan Mutchler K. Olena Heckle NP

## 2020-08-27 ENCOUNTER — Other Ambulatory Visit
Admission: RE | Admit: 2020-08-27 | Discharge: 2020-08-27 | Disposition: A | Discharge status: Home / Self Care | Payer: Medicare Other | Source: Skilled Nursing Facility | Attending: Internal Medicine | Admitting: Internal Medicine

## 2020-08-27 DIAGNOSIS — C9000 Multiple myeloma not having achieved remission: Secondary | ICD-10-CM | POA: Diagnosis present

## 2020-08-27 LAB — CBC WITH DIFFERENTIAL/PLATELET
Abs Immature Granulocytes: 0.06 10*3/uL (ref 0.00–0.07)
Basophils Absolute: 0 10*3/uL (ref 0.0–0.1)
Basophils Relative: 0 %
Eosinophils Absolute: 0 10*3/uL (ref 0.0–0.5)
Eosinophils Relative: 1 %
HCT: 32.8 % — ABNORMAL LOW (ref 39.0–52.0)
Hemoglobin: 11.1 g/dL — ABNORMAL LOW (ref 13.0–17.0)
Immature Granulocytes: 1 %
Lymphocytes Relative: 37 %
Lymphs Abs: 1.6 10*3/uL (ref 0.7–4.0)
MCH: 31.4 pg (ref 26.0–34.0)
MCHC: 33.8 g/dL (ref 30.0–36.0)
MCV: 92.7 fL (ref 80.0–100.0)
Monocytes Absolute: 0.5 10*3/uL (ref 0.1–1.0)
Monocytes Relative: 11 %
Neutro Abs: 2.1 10*3/uL (ref 1.7–7.7)
Neutrophils Relative %: 50 %
Platelets: 74 10*3/uL — ABNORMAL LOW (ref 150–400)
RBC: 3.54 MIL/uL — ABNORMAL LOW (ref 4.22–5.81)
RDW: 16 % — ABNORMAL HIGH (ref 11.5–15.5)
WBC: 4.3 10*3/uL (ref 4.0–10.5)
nRBC: 0 % (ref 0.0–0.2)

## 2020-08-27 LAB — COMPREHENSIVE METABOLIC PANEL
ALT: <5 U/L (ref 0–44)
AST: 16 U/L (ref 15–41)
Albumin: 3.7 g/dL (ref 3.5–5.0)
Alkaline Phosphatase: 69 U/L (ref 38–126)
Anion gap: 12 (ref 5–15)
BUN: 22 mg/dL (ref 8–23)
CO2: 23 mmol/L (ref 22–32)
Calcium: 9.1 mg/dL (ref 8.9–10.3)
Chloride: 92 mmol/L — ABNORMAL LOW (ref 98–111)
Creatinine, Ser: 1.13 mg/dL (ref 0.61–1.24)
GFR, Estimated: >60 mL/min (ref >60)
Glucose, Bld: 129 mg/dL — ABNORMAL HIGH (ref 70–99)
Potassium: 3.6 mmol/L (ref 3.5–5.1)
Sodium: 127 mmol/L — ABNORMAL LOW (ref 135–145)
Total Bilirubin: 0.8 mg/dL (ref 0.3–1.2)
Total Protein: 6.3 g/dL — ABNORMAL LOW (ref 6.5–8.1)

## 2020-08-28 ENCOUNTER — Telehealth: Payer: Self-pay | Admitting: *Deleted

## 2020-08-28 LAB — IGG, IGA, IGM
IgA: 57 mg/dL — ABNORMAL LOW (ref 61–437)
IgG (Immunoglobin G), Serum: 508 mg/dL — ABNORMAL LOW (ref 603–1613)
IgM (Immunoglobulin M), Srm: 20 mg/dL (ref 15–143)

## 2020-08-28 LAB — KAPPA/LAMBDA LIGHT CHAINS
Kappa free light chain: 3457.3 mg/L — ABNORMAL HIGH (ref 3.3–19.4)
Kappa, lambda light chain ratio: 652.32 — ABNORMAL HIGH (ref 0.26–1.65)
Lambda free light chains: 5.3 mg/L — ABNORMAL LOW (ref 5.7–26.3)

## 2020-08-28 NOTE — Telephone Encounter (Signed)
Yes.  He is not on treatment so we can push him out about 21 days.

## 2020-08-28 NOTE — Telephone Encounter (Signed)
Patient's daughter called to report that he had tested positive for COVID approximately 5 days ago. She would like to know if this will impact his appointment on 2/9.

## 2020-09-01 ENCOUNTER — Telehealth: Payer: Self-pay | Admitting: Oncology

## 2020-09-01 ENCOUNTER — Encounter: Payer: Self-pay | Admitting: Oncology

## 2020-09-01 NOTE — Telephone Encounter (Signed)
Returned call to [employer] (Solicitor at [employer] at [place]). Confirmed pt's appt on 09/17/20.

## 2020-09-03 ENCOUNTER — Ambulatory Visit: Payer: Federal, State, Local not specified - PPO | Admitting: Oncology

## 2020-09-03 ENCOUNTER — Ambulatory Visit: Payer: Federal, State, Local not specified - PPO

## 2020-09-03 ENCOUNTER — Telehealth: Payer: Self-pay | Admitting: *Deleted

## 2020-09-03 NOTE — Telephone Encounter (Signed)
Correct, he is not taking it anymore.

## 2020-09-03 NOTE — Telephone Encounter (Signed)
Received a fax requesting a refill of Revlimid, I do not see that he is currently taking this. Please advise

## 2020-09-14 NOTE — Progress Notes (Signed)
[facility]  [phone] [phone]  ID: Mauri Pole OB: 20-Jan-1934  MR#: [id number]  KCL#:[id number]  Patient Care Team: Perrin Maltese, MD as PCP - General (Internal Medicine) Lloyd Huger, MD as Consulting Physician (Hematology and Oncology)   CHIEF COMPLAINT: Kappa light chain myeloma.   INTERVAL HISTORY: Patient returns to clinic today for repeat laboratory work and further evaluation.  His visit was delayed secondary to Covid infection.  He continues to have increased weakness and fatigue, shortness of breath, and residual cough, but otherwise feels well.  He does not complain of pain today. He has a resting tremor secondary to his Parkinson's, but no other neurologic complaints.  He denies any fevers.  He has no chest pain or hemoptysis. He denies any nausea, vomiting, or diarrhea.  He admits to occasional constipation.  He has no urinary complaints.  Patient offers no further specific complaints today.  REVIEW OF SYSTEMS:   Review of Systems  Constitutional: Positive for malaise/fatigue. Negative for fever and weight loss.  Respiratory: Positive for cough and shortness of breath. Negative for hemoptysis.   Cardiovascular: Negative.  Negative for chest pain and leg swelling.  Gastrointestinal: Positive for constipation. Negative for abdominal pain.  Genitourinary: Negative.  Negative for dysuria.  Musculoskeletal: Negative.  Negative for back pain.  Skin: Negative.  Negative for rash.  Neurological: Positive for tremors and weakness. Negative for dizziness, focal weakness and headaches.  Psychiatric/Behavioral: Negative.  The patient is not nervous/anxious.     As per HPI. Otherwise, a complete review of systems is negative.  PAST MEDICAL HISTORY: Past Medical History:  Diagnosis Date  . Cancer ([place])    skin  . COPD (chronic obstructive pulmonary disease) ([place])   . Hyperlipemia   . Hypertension   . Lymphedema of both  lower extremities   . Parkinson disease ([place])   . Parkinsonism ([place]) 02/21/2015  . Spinal stenosis   . Tremor     PAST SURGICAL HISTORY: Past Surgical History:  Procedure Laterality Date  . APPENDECTOMY    . CATARACT EXTRACTION    . CHOLECYSTECTOMY    . TONSILLECTOMY    . VASECTOMY      FAMILY HISTORY: Family History  Problem Relation Age of Onset  . Cancer Mother   . Heart disease Father     ADVANCED DIRECTIVES (Y/N):  N  HEALTH MAINTENANCE: Social History   Tobacco Use  . Smoking status: Never Smoker  . Smokeless tobacco: Never Used  Vaping Use  . Vaping Use: Never used  Substance Use Topics  . Alcohol use: No    Alcohol/week: 0.0 standard drinks  . Drug use: No     Colonoscopy:  PAP:  Bone density:  Lipid panel:  Allergies  Allergen Reactions  . Carbidopa-Levodopa Other (See Comments)    Severe stomach pains, can take rytary  . Oxycodone-Acetaminophen Other (See Comments)    "boils on skin"  . Tyloxapol   . Acetaminophen Rash, Nausea And Vomiting and Hives  . Pramipexole Nausea Only    Current Outpatient Medications  Medication Sig Dispense Refill  . aspirin 81 MG tablet Take 81 mg by mouth daily.    . Calcium Carbonate-Vitamin D (OYSTER SHELL CALCIUM/D) 250-125 MG-UNIT TABS Take 1 tablet by mouth daily.     . Calcium Citrate 250 MG TABS Take 1 tablet by mouth daily.     . Carbidopa-Levodopa ER (RYTARY) 48.75-195 MG CPCR Take 195 mg by mouth 5 (five) times daily. 450 capsule  3  . cetirizine (ZYRTEC) 10 MG tablet Take 10 mg by mouth daily.    . finasteride (PROSCAR) 5 MG tablet Take 1 tablet (5 mg total) by mouth daily. 90 tablet 3  . fluticasone (FLONASE) 50 MCG/ACT nasal spray Place 1 spray into both nostrils daily as needed for allergies.     . Fluticasone-Umeclidin-Vilant (TRELEGY ELLIPTA) 100-62.5-25 MCG/INH AEPB Inhale 1 spray into the lungs daily.     . furosemide (LASIX) 40 MG tablet Take 40 mg by mouth 2 (two) times daily.    Marland Kitchen ibuprofen  (ADVIL) 400 MG tablet Take 400 mg by mouth every 6 (six) hours as needed.    Marland Kitchen ipratropium (ATROVENT) 0.03 % nasal spray Place 0.03 mLs into both nostrils 3 (three) times daily as needed.  11  . lactulose (CHRONULAC) 10 GM/15ML solution Take 10 g by mouth every 12 (twelve) hours as needed for mild constipation.    . niacin 500 MG tablet Take 500 mg by mouth every morning.     Marland Kitchen omeprazole (PRILOSEC) 40 MG capsule Take 40 mg by mouth daily.    . polyethylene glycol (MIRALAX / GLYCOLAX) 17 g packet Take 17 g by mouth daily.    . potassium chloride (KLOR-CON) 10 MEQ tablet Take 10 mEq by mouth 2 (two) times daily.    . potassium chloride (MICRO-K) 10 MEQ CR capsule Take by mouth.    . prochlorperazine (COMPAZINE) 10 MG tablet Take 1 tablet (10 mg total) by mouth every 6 (six) hours as needed for nausea or vomiting. 30 tablet 0  . rosuvastatin (CRESTOR) 20 MG tablet Take 20 mg by mouth at bedtime.    . senna-docusate (SENOKOT-S) 8.6-50 MG tablet Take 2 tablets by mouth 2 (two) times daily as needed for mild constipation.    . sertraline (ZOLOFT) 25 MG tablet Take 25 mg by mouth at bedtime.     Marland Kitchen spironolactone (ALDACTONE) 25 MG tablet Take 25 mg by mouth daily.     . tamsulosin (FLOMAX) 0.4 MG CAPS capsule Take 1 capsule (0.4 mg total) by mouth daily. 90 capsule 3  . vitamin B-12 (CYANOCOBALAMIN) 1000 MCG tablet Take 1,000 mcg by mouth daily.     No current facility-administered medications for this visit.    OBJECTIVE: Vitals:   09/17/20 1029  BP: (!) 159/80  Pulse: 72  Resp: 16  Temp: 98.2 F (36.8 C)     There is no height or weight on file to calculate BMI.    ECOG FS:1 - Symptomatic but completely ambulatory  General: Thin, no acute distress.  Sitting in a wheelchair. Eyes: Pink conjunctiva, anicteric sclera. HEENT: Normocephalic, moist mucous membranes. Lungs: No audible wheezing or coughing. Heart: Regular rate and rhythm. Abdomen: Soft, nontender, no obvious  distention. Musculoskeletal: No edema, cyanosis, or clubbing. Neuro: Alert, answering all questions appropriately. Cranial nerves grossly intact. Skin: No rashes or petechiae noted. Psych: Normal affect.   LAB RESULTS:  Lab Results  Component Value Date   NA 127 (L) 08/27/2020   K 3.6 08/27/2020   CL 92 (L) 08/27/2020   CO2 23 08/27/2020   GLUCOSE 129 (H) 08/27/2020   BUN 22 08/27/2020   CREATININE 1.13 08/27/2020   CALCIUM 9.1 08/27/2020   PROT 6.3 (L) 08/27/2020   ALBUMIN 3.7 08/27/2020   AST 16 08/27/2020   ALT <5 08/27/2020   ALKPHOS 69 08/27/2020   BILITOT 0.8 08/27/2020   GFRNONAA >60 08/27/2020   GFRAA >60 04/04/2020    Lab Results  Component Value Date   WBC 4.3 08/27/2020   NEUTROABS 2.1 08/27/2020   HGB 11.1 (L) 08/27/2020   HCT 32.8 (L) 08/27/2020   MCV 92.7 08/27/2020   PLT 74 (L) 08/27/2020     STUDIES: No results found.  ASSESSMENT: Kappa light chain myeloma.  PLAN:    1. Kappa light chain myeloma: Bone marrow biopsy on May 01, 2019 revealed increased plasma cells of 30 to 40% with kappa light chain restriction.  SPEP is negative and IgG, IgA, and IgM are within normal limits.  Kappa free light chains have significantly increased and are now 3457.3.  Patient last received Revlimid in May 2021 and has agreed to reinitiate treatment at 20 mg daily for 21 days and 7 days off.  He declined Zometa today.  Appreciate clinical pharmacy input.  Return to clinic in 4 weeks for repeat laboratory work, further evaluation, and continuation of Zometa.   2.  Right clavicle fracture: Pathologic.  Resolved.  Patient reports no further interventions are needed by orthopedics. 3.  Hypocalcemia: Resolved.  Patient agreed to Zometa with next clinic visit. 4.  Iron deficiency anemia: Hemoglobin continues to slowly improve and is now 11.1. 5.  Thrombocytopenia: Chronic and unchanged.  Patient platelet count is 74 today. 6.  Leukopenia: Resolved. 7.  Constipation:  Recommended OTC Magnesium citrate.  MiraLAX as needed. 8.  Hypokalemia: Resolved.  Continue oral supplementation.  9.  Dental pain: Patient reports no interventions by his dentist currently.  Reinitiate Zometa as above. 10. Renal Insufficiency: Essentially resolved. 11.  Covid infection: Patient continues to have residual cough and shortness of breath, monitor.  Patient expressed understanding and was in agreement with this plan. He also understands that He can call clinic at any time with any questions, concerns, or complaints.    Lloyd Huger, MD   09/18/2020 6:28 AM

## 2020-09-16 ENCOUNTER — Encounter
Admission: RE | Admit: 2020-09-16 | Discharge: 2020-09-16 | Disposition: A | Discharge status: Home / Self Care | Payer: Medicare Other | Source: Ambulatory Visit | Attending: Internal Medicine | Admitting: Internal Medicine

## 2020-09-17 ENCOUNTER — Inpatient Hospital Stay: Payer: Medicare Other | Attending: Oncology | Admitting: Oncology

## 2020-09-17 ENCOUNTER — Inpatient Hospital Stay: Payer: Medicare Other

## 2020-09-17 ENCOUNTER — Telehealth: Payer: Self-pay | Admitting: Pharmacy Technician

## 2020-09-17 ENCOUNTER — Other Ambulatory Visit: Payer: Self-pay

## 2020-09-17 ENCOUNTER — Telehealth: Payer: Self-pay | Admitting: Pharmacist

## 2020-09-17 ENCOUNTER — Encounter: Payer: Self-pay | Admitting: Oncology

## 2020-09-17 VITALS — BP 159/80 | HR 72 | Temp 98.2°F | Resp 16

## 2020-09-17 DIAGNOSIS — Z8249 Family history of ischemic heart disease and other diseases of the circulatory system: Secondary | ICD-10-CM | POA: Insufficient documentation

## 2020-09-17 DIAGNOSIS — K0889 Other specified disorders of teeth and supporting structures: Secondary | ICD-10-CM | POA: Insufficient documentation

## 2020-09-17 DIAGNOSIS — C9000 Multiple myeloma not having achieved remission: Secondary | ICD-10-CM | POA: Diagnosis present

## 2020-09-17 DIAGNOSIS — Z79899 Other long term (current) drug therapy: Secondary | ICD-10-CM | POA: Diagnosis not present

## 2020-09-17 DIAGNOSIS — D696 Thrombocytopenia, unspecified: Secondary | ICD-10-CM | POA: Diagnosis not present

## 2020-09-17 DIAGNOSIS — J449 Chronic obstructive pulmonary disease, unspecified: Secondary | ICD-10-CM | POA: Insufficient documentation

## 2020-09-17 DIAGNOSIS — Z809 Family history of malignant neoplasm, unspecified: Secondary | ICD-10-CM | POA: Diagnosis not present

## 2020-09-17 DIAGNOSIS — G20 Parkinson's disease: Secondary | ICD-10-CM | POA: Insufficient documentation

## 2020-09-17 DIAGNOSIS — Z8616 Personal history of COVID-19: Secondary | ICD-10-CM | POA: Insufficient documentation

## 2020-09-17 DIAGNOSIS — Z9049 Acquired absence of other specified parts of digestive tract: Secondary | ICD-10-CM | POA: Diagnosis not present

## 2020-09-17 DIAGNOSIS — D509 Iron deficiency anemia, unspecified: Secondary | ICD-10-CM | POA: Insufficient documentation

## 2020-09-17 DIAGNOSIS — K5900 Constipation, unspecified: Secondary | ICD-10-CM | POA: Insufficient documentation

## 2020-09-17 NOTE — Telephone Encounter (Signed)
Oral Oncology Pharmacist Encounter  Received new prescription for Revlimid (lenalidomide) for the treatment of recurrent multiple myeloma, planned duration until disease progression or unacceptable drug toxicity. Patient previously on Revlimid with a good response. Patient requested a treatment break, but now needs to resume treatment.  Labs from 08/27/20 assessed, no relevant lab abnormalities but patient CrCl ~78m/min. This will require a renal dose adjustment. Prescription dose and frequency assessed. Aspirin 896mcurrently in patient's medication list. Will verify he is still taking.  Current medication list in Epic reviewed, no DDIs with lenalidomide identified  Evaluated chart and no patient barriers to medication adherence identified.   Prescription has been e-scribed to the [facility] for benefits analysis and approval.  Oral Oncology Clinic will continue to follow for insurance authorization, copayment issues, initial counseling and start date.  Patient agreed to treatment on 09/18/20 per MD documentation.  AlDarl PikesPharmD, BCPS, BCOP, CPP Hematology/Oncology Clinical Pharmacist Practitioner [phone] 51842/23/2022 6:41 PM

## 2020-09-17 NOTE — Telephone Encounter (Signed)
Oral Oncology Patient Advocate Encounter   Received notification from [place] that prior authorization for Revlimid is required.   PA submitted on CoverMyMeds Key [id number]  Status is pending   Oral Oncology Clinic will continue to follow.  [place] Patient [place] Phone [phone] Fax [phone] 09/18/2020 8:55 AM

## 2020-09-17 NOTE — Progress Notes (Signed)
Pt and family member in for follow up,  Pt had covid the end of January and has continued to be weak, worse again since Monday.

## 2020-09-18 ENCOUNTER — Other Ambulatory Visit: Payer: Self-pay

## 2020-09-18 MED ORDER — LENALIDOMIDE 15 MG PO CAPS: 15.0000 mg | ORAL_CAPSULE | Freq: Every day | ORAL | 0 refills | Status: DC

## 2020-09-18 MED ORDER — LENALIDOMIDE 15 MG PO CAPS MILLENIUM C16014: ORAL_CAPSULE | ORAL | 0 refills | Status: DC

## 2020-09-18 MED ORDER — LENALIDOMIDE 15 MG PO CAPS MILLENIUM C16014: 15.0000 mg | ORAL_CAPSULE | Freq: Every day | ORAL | 0 refills | Status: DC

## 2020-09-18 NOTE — Telephone Encounter (Signed)
Oral Oncology Patient Advocate Encounter  Prior Authorization for Revlimid   has been approved.    PA# [id number] Effective dates: 08/19/20 through 09/18/21  Patient must use [facility].  Oral Oncology Clinic will continue to follow.   [place] Patient [place] Phone [phone] Fax [phone] 09/18/2020 9:05 AM

## 2020-09-22 NOTE — Telephone Encounter (Signed)
Called patient and made a conference call with [facility] to schedule delivery for Revlimid. Patient will receive Revlimid on 09/25/20.   [place] Patient [place] Phone [phone] Fax [phone] 09/22/2020 10:51 AM

## 2020-09-23 ENCOUNTER — Other Ambulatory Visit: Payer: Self-pay | Admitting: Neurology

## 2020-09-23 ENCOUNTER — Non-Acute Institutional Stay: Payer: Medicare Other | Admitting: Adult Health Nurse Practitioner

## 2020-09-23 ENCOUNTER — Other Ambulatory Visit: Payer: Self-pay | Admitting: Oncology

## 2020-09-23 ENCOUNTER — Telehealth: Payer: Self-pay | Admitting: Pharmacist

## 2020-09-23 ENCOUNTER — Other Ambulatory Visit: Payer: Self-pay

## 2020-09-23 DIAGNOSIS — G20 Parkinson's disease: Secondary | ICD-10-CM

## 2020-09-23 DIAGNOSIS — R5381 Other malaise: Secondary | ICD-10-CM

## 2020-09-23 DIAGNOSIS — Z515 Encounter for palliative care: Secondary | ICD-10-CM

## 2020-09-23 DIAGNOSIS — C9000 Multiple myeloma not having achieved remission: Secondary | ICD-10-CM

## 2020-09-23 MED ORDER — LENALIDOMIDE 15 MG PO CAPS: 15.0000 mg | ORAL_CAPSULE | Freq: Every day | ORAL | 0 refills | Status: AC

## 2020-09-23 NOTE — Telephone Encounter (Signed)
Oral Chemotherapy Pharmacist Encounter  Revlimid will be delivered by [facility] on 3/3 to his assisted living facility.   Patient Education I spoke with patient for overview of new oral chemotherapy medication: Revlimid (lenalidomide) for the treatment of recurrent multiple myeloma, planned duration until disease progression or unacceptable drug toxicity. Patient previously on Revlimid with a good response. Patient requested a treatment break, but now needs to resume treatment.   Pt is doing well. Counseled patient on administration, dosing, side effects, monitoring, drug-food interactions, safe handling, storage, and disposal. Patient will take 1 capsule (15 mg total) by mouth daily. Take for 21 days, then hold for 7 days. Repeat every 28 days.  Patient confirmed he is still taking aspirin 68m daily.  Side effects include but not limited to: constipation or diarrhea, N/V, fatigue, decreased wbc/hgb/plt, rash/itchy skin.    When previously on lenalidomide he experienced constipation. He will let the nurses know at his assisted living facility know if his constipation returns.  Reviewed with patient importance of keeping a medication schedule and plan for any missed doses.  After discussion with patient no patient barriers to medication adherence identified. His medications are administered to him by the facility.   Adam Moss understanding and appreciation. All questions answered. Medication handout and calendar provided.  [facility]ided [facility] with [facility]. Patient knows to call the office with questions or concerns. Oral Chemotherapy Navigation Clinic will continue to follow.  ADarl Pikes PharmD, BCPS, BCOP, CPP Hematology/Oncology Clinical Pharmacist Practitioner [phone] 09/23/2020 11:08 AM

## 2020-09-23 NOTE — Progress Notes (Signed)
[facility] Consult Note Telephone: [phone]  Fax: [phone]  PATIENT NAME: Adam Moss DOB: May 04, 1934 MRN: [id number]  PRIMARY CARE PROVIDER:   Dr. Frazier Richards  REFERRING PROVIDER:  Dr. Frazier Richards  RESPONSIBLE PARTY:   Binnie Kand, son [phone]  Chief complaint:  Follow up palliative visit/dry mouth   RECOMMENDATIONS and PLAN: 1.Advanced care planning. Patient is DNR.  Will call son to update on visit  2.  Debility.  Believe patient's symptoms related to lingering side effects of Covid infection.  He does seem to be improving at this time.  Discussed with patient about lingering Covid symptoms and that he may continue to experience weakness, fatigue, brain fog for a while.  Did encourage to continue working with PT to regain his strength.  Continue PT as ordered  3.  Multiple myeloma.  Patient wishes to continue with Revlimid.  Continue follow-up and recommendations by oncology.  Palliative will continue to monitor for symptom management/decline and make recommendations as needed.  We will follow up in 4 to 6 weeks.  I spent 60 minutes providing this consultation, including time with patient, provider coordination, chart review, documentation. More than 50% of the time in this consultation was spent coordinating communication.   HISTORY OF PRESENT ILLNESS:  Adam Moss is a 87 y.o. year old male with multiple medical problems including Parkinson's, anascara, COPD, HTN, HLD, venous insufficiency. Palliative Care was asked to help address goals of care.  Was asked to come see patient today by facility staff.  Patient had recent Covid infection with increased S OB, cough, congestion, fever.  Patient has been having weakness, confusion, incontinent episodes, not eating or drinking much, staff having to crush his meds to get him to take.  Today staff reports that he is doing better.  Today he is not  confused, he ate his breakfast, and is taking his pills whole again.  Patient states that he is feeling better though he is still weak.  States that he started PT yesterday and this really wore him out.  Today patient is alert and oriented x3.  He is able to hold a conversation and tell me about his recent oncology visit.  Patient has multiple myeloma but has not achieved remission.  Was on Revlimid and has taken a break from this.  Lab work is worsening and is to restart Revlimid.  CODE STATUS: DNR  PPS: 40% HOSPICE ELIGIBILITY/DIAGNOSIS: TBD  PHYSICAL EXAM: BP 80/62 HR 62 O2 92% on room air General: NAD, frail appearing Eyes: Sclera anicteric and noninjected with no discharge noted Cardiovascular: regular rate and rhythm Pulmonary:lung sounds clear; normal respiratory effort Abdomen: soft, nontender, + bowel sounds Extremities: 2+ edema to bilateral feet, no joint deformities Skin: no rasheson exposed skin Neurological: Weakness but otherwise nonfocal  PAST MEDICAL HISTORY:  Past Medical History:  Diagnosis Date  . Cancer ([place])    skin  . COPD (chronic obstructive pulmonary disease) ([place])   . Hyperlipemia   . Hypertension   . Lymphedema of both lower extremities   . Parkinson disease ([place])   . Parkinsonism ([place]) 02/21/2015  . Spinal stenosis   . Tremor     SOCIAL HX:  Social History   Tobacco Use  . Smoking status: Never Smoker  . Smokeless tobacco: Never Used  Substance Use Topics  . Alcohol use: No    Alcohol/week: 0.0 standard drinks    ALLERGIES:  Allergies  Allergen Reactions  . Carbidopa-Levodopa Other (See  Comments)    Severe stomach pains, can take rytary  . Oxycodone-Acetaminophen Other (See Comments)    "boils on skin"  . Tyloxapol   . Acetaminophen Rash, Nausea And Vomiting and Hives  . Pramipexole Nausea Only     PERTINENT MEDICATIONS:  Outpatient Encounter Medications as of 09/23/2020  Medication Sig  . aspirin 81 MG tablet Take 81 mg by mouth  daily.  . Calcium Carbonate-Vitamin D (OYSTER SHELL CALCIUM/D) 250-125 MG-UNIT TABS Take 1 tablet by mouth daily.   . Calcium Citrate 250 MG TABS Take 1 tablet by mouth daily.   . cetirizine (ZYRTEC) 10 MG tablet Take 10 mg by mouth daily.  . finasteride (PROSCAR) 5 MG tablet Take 1 tablet (5 mg total) by mouth daily.  . fluticasone (FLONASE) 50 MCG/ACT nasal spray Place 1 spray into both nostrils daily as needed for allergies.   . Fluticasone-Umeclidin-Vilant (TRELEGY ELLIPTA) 100-62.5-25 MCG/INH AEPB Inhale 1 spray into the lungs daily.   . furosemide (LASIX) 40 MG tablet Take 40 mg by mouth 2 (two) times daily.  Marland Kitchen ibuprofen (ADVIL) 400 MG tablet Take 400 mg by mouth every 6 (six) hours as needed.  Marland Kitchen ipratropium (ATROVENT) 0.03 % nasal spray Place 0.03 mLs into both nostrils 3 (three) times daily as needed.  . lactulose (CHRONULAC) 10 GM/15ML solution Take 10 g by mouth every 12 (twelve) hours as needed for mild constipation.  . niacin 500 MG tablet Take 500 mg by mouth every morning.   Marland Kitchen omeprazole (PRILOSEC) 40 MG capsule Take 40 mg by mouth daily.  . polyethylene glycol (MIRALAX / GLYCOLAX) 17 g packet Take 17 g by mouth daily.  . potassium chloride (KLOR-CON) 10 MEQ tablet Take 10 mEq by mouth 2 (two) times daily.  . potassium chloride (MICRO-K) 10 MEQ CR capsule Take by mouth.  . prochlorperazine (COMPAZINE) 10 MG tablet Take 1 tablet (10 mg total) by mouth every 6 (six) hours as needed for nausea or vomiting.  . rosuvastatin (CRESTOR) 20 MG tablet Take 20 mg by mouth at bedtime.  Marland Kitchen RYTARY 48.75-195 MG CPCR TAKE 1 CAPSULE 5 TIMES     DAILY  . senna-docusate (SENOKOT-S) 8.6-50 MG tablet Take 2 tablets by mouth 2 (two) times daily as needed for mild constipation.  . sertraline (ZOLOFT) 25 MG tablet Take 25 mg by mouth at bedtime.   Marland Kitchen spironolactone (ALDACTONE) 25 MG tablet Take 25 mg by mouth daily.   . tamsulosin (FLOMAX) 0.4 MG CAPS capsule Take 1 capsule (0.4 mg total) by mouth daily.   . vitamin B-12 (CYANOCOBALAMIN) 1000 MCG tablet Take 1,000 mcg by mouth daily.   No facility-administered encounter medications on file as of 09/23/2020.      Amy Jenetta Downer, NP

## 2020-09-23 NOTE — Telephone Encounter (Signed)
Oral Chemotherapy Pharmacist Encounter   Called [facility] to let them know about the new Revlimid order for Mr. Lohmeyer. Signed prescription for the Revlimid faxed over to the facility (fax:[phone]) and an order for administration. Prescription note added to start on 09/25/20.     Darl Pikes, PharmD, BCPS, BCOP, CPP Hematology/Oncology Clinical Pharmacist [facility] [phone]  09/23/2020 11:51 AM

## 2020-10-01 ENCOUNTER — Ambulatory Visit (INDEPENDENT_AMBULATORY_CARE_PROVIDER_SITE_OTHER): Payer: Medicare Other | Admitting: Neurology

## 2020-10-01 ENCOUNTER — Other Ambulatory Visit: Payer: Self-pay

## 2020-10-01 ENCOUNTER — Encounter: Payer: Self-pay | Admitting: Neurology

## 2020-10-01 VITALS — BP 118/74 | HR 74 | Wt 152.0 lb

## 2020-10-01 DIAGNOSIS — M7989 Other specified soft tissue disorders: Secondary | ICD-10-CM

## 2020-10-01 DIAGNOSIS — R5381 Other malaise: Secondary | ICD-10-CM | POA: Diagnosis not present

## 2020-10-01 DIAGNOSIS — R4589 Other symptoms and signs involving emotional state: Secondary | ICD-10-CM | POA: Diagnosis not present

## 2020-10-01 DIAGNOSIS — G20 Parkinson's disease: Secondary | ICD-10-CM | POA: Diagnosis not present

## 2020-10-01 NOTE — Patient Instructions (Signed)
It was good to see you again today.  I am sorry to hear that you are having a hard time currently, probably due to a combination of factors including having had Covid in January 2022, progressive myeloma, Parkinson's disease and overall deconditioning and debility.  Please talk to your primary care doctor or nurse practitioner about management of your depression, it may help to try a low-dose antidepressant.  Please continue exercises that you were provided by physical therapy and use your pneumatic boots for reduction of your swelling.  We will continue with the Rytary at the current dose and timings.  Please follow-up in about 3 to 4 months, sooner if needed in this clinic.

## 2020-10-01 NOTE — Progress Notes (Signed)
Subjective:    Patient ID: Adam Moss is a 87 y.o. male.  HPI     Interim history:   [name] is a very pleasant 87 year old left-handed gentleman with an underlying medical history of degenerative spine disease, allergies, BPH, hypertension, arthritis, aortic insufficiency, reflux disease, insomnia, and multiple myeloma, who presents for Follow-up consultation of his Parkinson's disease. He is accompanied by his daughter in law again today. I last saw him on 05/07/2020, at which time he was in skilled nursing at [facility].  He was in the process of transitioning to assisted living.  He had experienced weight loss and appetite loss.  He felt globally weaker.  He was advised to continue with his Rytary.  Today, 10/01/20: He reports not feeling well.  He has had Covid diagnosed in January 2022 and per daughter-in-law he had symptoms of congestion, and he reports lack of appetite, hearing loss, more difficulty with tremor and difficulty standing and walking.  He can barely stand or walk and sometimes needs 2 people assist.  He has some physical therapy at the bedside.  He has had more swelling in the lower extremities primarily because of confinement to the recliner or wheelchair.  He has pneumatic boots but does not always keep them on.  He does not always hydrate well with water.  He does not sleep well at night but endorses being sleepy during the day and dozing off in the middle of the day.  His daughter-in-law reports that they did notice more depression and they discussed it with the primary care, he was tried on sertraline but did not want to continue with it, did not actually have side effects but wanted to stop it as I understand.  He also feels that his vision is worse.  He has not had a formal eye examination in years.  He is going to see a traveling podiatrist at his facility and also has signed up to be seen by a traveling optometrist at his facility and they are discussing  transitioning him to skilled nursing versus assisted living.  He has been restarted on his chemotherapy for his multiple myeloma.  He is followed by hematology, and palliative care.   The patient's allergies, current medications, family history, past medical history, past social history, past surgical history and problem list were reviewed and updated as appropriate.    Previously (copied from previous notes for reference):    I saw him on 10/16/19, at which time he reported doing a little better.  He was back into his independent living apartment with a caretaker daily.  He had seen cardiology.  He had been hospitalized for 3 days in February for dehydration and UTI.   He had a fall in July.  He was hospitalized from 02/20/2020 through 02/25/2020.  Unfortunately, he sustained a thoracic vertebral compression fractures.  He is followed by oncology for his multiple myeloma.    He had a T and L spine Xray through the ER on 02/20/20 and I reviewed the results: IMPRESSION: 1. Acute fractures of the T2 and T12 vertebral bodies, as described. The T2 fracture appears pathologic. 2. Interval pathologic fracture of the L2 vertebral body compared with CTs from September. This fracture does not appear acute. No definite acute fractures in the lumbar spine. 3. Multiple lytic lesions are again noted throughout the thoracolumbar spine and sacrum consistent with multiple myeloma or metastatic disease. Correlate with previous biopsy results. 4. Progressive degenerative changes at L1-2 with a new  extruded disc fragment containing vacuum phenomenon extending inferiorly behind the L2 vertebral body on the left. This may contribute to left-sided nerve root encroachment. 5. Aortic Atherosclerosis (ICD10-I70.0) and Emphysema (ICD10-J43.9).     I saw him in a virtual visit on 08/21/2019, at which time he requested a sooner than his scheduled appointment for his foot pain. I did not suggest any new medications at the  time.  We talked about potentially trying gabapentin if needed.  His daughter-in-law indicated that she might have him try CBD oil.     I saw him on 04/18/2019, at which time he reported interim stress, he was not sleeping very well, he had an increase in his tremor on the left side.  He was noted to have edema.  He was hospitalized for this and was noted to have lymphedema.  He was noted to have several bony lytic lesions and had a pathological fracture of his right clavicle.  He was eventually diagnosed with multiple myeloma.  He is being followed by oncology for this.  He was advised to start taking melatonin at night for sleep and continue with his Parkinson's medication.     Of note, he missed an appointment on 02/19/2019.  I saw him on 08/21/2018, at which time he reported feeling fairly stable, with the exception of increase in tremor noted.  He was on Rytary 4 times a day about 6 hourly.  He was advised to try to increase the Rytary 195 mg strength to 1 pill 5 times a day, about 5 hourly.      02/15/2018, at which time he reported feeling stable. He had occasional issues with constipation and had some mild forgetfulness, otherwise was doing well. I suggested he continue with his Rytary.    I saw him on 08/18/2017, at which time he felt stable. We continued with his Rytary, 195 mg qid.      I saw him on 04/18/17, at which time he reported worsening tremor, he was having some difficulty with fine motor control. He was taking Rytary 4 times a day and at bedtime. He was trying to keep set schedule for his bedtime routine and wake up routine and medication regimen. I suggested he change the timing of his medication to 1 pill at 6, 11, 4 PM and 10 PM. We talked about potentially increasing the medication to 1 pill 5 times a day.   I saw him on 10/13/2016, at which time he reported doing okay. He had occasional muscle pain in different areas of his lower back and also thighs, sometimes around the ankles.  He was trying to walk on a regular basis. Memory mood were stable. Sadly, his wife passed away in 2016/05/28. I suggested, we keep his meds the same.     We had to cancel an appointment for 06/08/2016 and he missed an appointment on 07/21/2016. I saw him on 02/04/2016, at which time he reported doing okay, tolerating Rytary 195 mg qid without significant side effects. He was not always exercising regularly. He does visit his wife in the afternoon every day. He was not always hydrating well enough. He had gained some weight. He had no recent falls, no complaints of hallucinations or memory issues, occasional depressed mood but no sustained symptoms of depression. I suggested we continue with his medication regimen. He was encouraged to hydrate better and exercise on a more regular basis.   I saw him on 09/24/2015, at which time he reported doing a little  better, able to tolerate the Rytary qid. Thankfully, h had no recent falls. He was not sleeping very well at night. He would visit his wife in memory care every day. His 3 children with visit about once a month. Is trying to hydrate well. He had noticed some ankle swelling and residual knee pain bilaterally. He was able to pursue his hobby of building model ships. I suggested he continue with Rytary at the current dose. He was encouraged to try melatonin for sleep.    I saw him on 05/26/2015, at which time he reported doing somewhat better with the new medication, Rytary, with improvement noted in fine motor skills and tremors. He had recent blood work through his primary care physician which I reviewed at the time: Lipid profile was unremarkable with the exception of triglycerides borderline at 155, BMP was normal and CBC was normal. He reported that his leg swelling was a little better. He was avoiding nighttime driving. He was visiting his wife and memory care regularly. He was able to tolerate the new medication which was really reassuring. She was  taking Rytary 195 mg, one pill at 9 AM, 1 pill at 5 PM, 1 pill at bedtime which is usually around midnight. He had gained about 10 pounds and was trying to lose weight. He worried about his weight gain but did admit to having good food and lots of choices for dessert at his assisted living place. I suggested a gentle increase in his medication to 1 pill 4 times a day.   I first met him on 02/21/2015 at the request of his primary neurologist, at which time the patient reported a history of left-sided tremor, fine motor dyscontrol, and gait difficulties. Symptoms dated back to 2-3 years prior. He had multiple medication intolerances particularly issues with GI side effects. I suggested a trial of Rytary, 95 mg strength with titration to 2 pills 3 times a day. I also suggested we proceed with a DaT scan and I referred him to [facility] nuclear medicine. He had the study on 03/19/2015 which showed abnormal image grade 1: Asymmetric uptake with normal or almost normal putamen activity in one hemisphere and with a more marked reduction in the contralateral putamen. This pattern of activity can be seen with Parkinson's disease or related syndromes.    Of note, the radiotracer uptake was decreased on the right. We called the patient with the test results. The patient emailed me in August reporting a slight decrease in his tremor with a new medication and ability to tolerated thus far. He asked for change and pill strength of possible because the medication is very expensive. I changed him to 195 mg strength one pill 3 times a day.    02/21/2015: He has a history of left-sided tremor, fine motor dyscontrol, and gait difficulty. 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 Symptoms date back to 2014 or the year before, when he started having difficulty getting out of a chair, tremors on  the left side, slowness, and he started seeing you at the neuroscience center in [facility]. He was diagnosed with Parkinson's disease and tried on different medications but had side effects, primarily GI related side effects. He has a history of cholecystectomy. Sinemet caused severe abdominal pain and nausea. He was tried on Neupro which also caused stomach pain, and on Azilect  low-dose he did not think he had any response. He has been on amantadine. His previous records were reviewed: This includes neurologic office notes from 01/10/2015, 11/08/2014, 09/09/2014, 08/19/2014, 05/20/2014, 02/18/2014, 11/30/2013, 07/06/2013, 06/01/2013, and 02/23/2013. He had a brain MRI years ago but results are not available for my review. He is currently on amantadine 100 mg twice daily. In 2015 he was tried on pramipexole but had severe GI upset. In July 2015 he reported that he had improvement with Neupro but stomach pain. In January 2016 he tried Sinemet but had to stop after a few days only. Earlier this year his Azilect was discontinued. He has no family history of Parkinson's disease. He reports mild memory loss and mild sleep issues. He currently resides in independent living at [facility] [place]. This is in [place]. His wife is a Marine scientist care. He drives. He has no recent history of falls. He tries to stay active physically. He has right knee pain and wears a soft brace around his right knee. He has a remote history of injury to his right knee. He has been off of amantadine for the past 2 weeks or so. He had significant blurry vision while on it to the point where he had to see an ophthalmologist. His blurry vision has improved since he stopped the amantadine. He feels that his symptoms have been slowly progressive. He is primarily bothered by his fine motor dyscontrol and tremor. He drives without significant problems but does not drive long distances. His Past Medical History Is Significant For: Past Medical  History:  Diagnosis Date  . Cancer ([place])    skin  . COPD (chronic obstructive pulmonary disease) ([place])   . Hyperlipemia   . Hypertension   . Lymphedema of both lower extremities   . Parkinson disease ([facility])   . Parkinsonism ([place]) 02/21/2015  . Spinal stenosis   . Tremor     His Past Surgical History Is Significant For: Past Surgical History:  Procedure Laterality Date  . APPENDECTOMY    . CATARACT EXTRACTION    . CHOLECYSTECTOMY    . TONSILLECTOMY    . VASECTOMY      His Family History Is Significant For: Family History  Problem Relation Age of Onset  . Cancer Mother   . Heart disease Father     His Social History Is Significant For: Social History   Socioeconomic History  . Marital status: Married    Spouse name: Not on file  . Number of children: 3  . Years of education: PhD  . Highest education level: Not on file  Occupational History  . Occupation: Retired  Tobacco Use  . Smoking status: Never Smoker  . Smokeless tobacco: Never Used  Vaping Use  . Vaping Use: Never used  Substance and Sexual Activity  . Alcohol use: No    Alcohol/week: 0.0 standard drinks  . Drug use: No  . Sexual activity: Not Currently  Other Topics Concern  . Not on file  Social History Narrative   Denies caffeine use    Social Determinants of Health   Financial Resource Strain: Not on file  Food Insecurity: Not on file  Transportation Needs: Not on file  Physical Activity: Not on file  Stress: Not on file  Social Connections: Not on file    His Allergies Are:  Allergies  Allergen Reactions  . Carbidopa-Levodopa Other (See Comments)    Severe stomach pains, can take rytary  . Oxycodone-Acetaminophen Other (See Comments)    "  boils on skin"  . Tyloxapol   . Acetaminophen Rash, Nausea And Vomiting and Hives  . Pramipexole Nausea Only  :   His Current Medications Are:  Outpatient Encounter Medications as of 10/01/2020  Medication Sig  . Alpha-D-Galactosidase (BEANO PO)  Take by mouth.  Marland Kitchen antiseptic oral rinse (BIOTENE) LIQD 15 mLs by Mouth Rinse route as needed for dry mouth.  Marland Kitchen aspirin 81 MG tablet Take 81 mg by mouth daily.  . Calcium Carbonate Antacid (TUMS PO) Take by mouth.  . Calcium Citrate 250 MG TABS Take 1 tablet by mouth daily.   . cetirizine (ZYRTEC) 10 MG tablet Take 10 mg by mouth daily.  . Emollient (CERAVE EX) Apply topically.  . finasteride (PROSCAR) 5 MG tablet Take 1 tablet (5 mg total) by mouth daily.  . fluticasone (FLONASE) 50 MCG/ACT nasal spray Place 1 spray into both nostrils daily as needed for allergies.   . Fluticasone-Umeclidin-Vilant (TRELEGY ELLIPTA) 100-62.5-25 MCG/INH AEPB Inhale 1 spray into the lungs daily.   . furosemide (LASIX) 40 MG tablet Take 40 mg by mouth 2 (two) times daily.  . GUAIFENESIN PO Take by mouth.  Marland Kitchen ibuprofen (ADVIL) 400 MG tablet Take 400 mg by mouth every 6 (six) hours as needed.  Marland Kitchen ipratropium (ATROVENT) 0.03 % nasal spray Place 0.03 mLs into both nostrils 3 (three) times daily as needed.  Marland Kitchen lenalidomide (REVLIMID) 15 MG capsule Take 1 capsule (15 mg total) by mouth daily. Take for 21 days, then hold for 7 days. Repeat every 28 days.  . niacin 500 MG tablet Take 500 mg by mouth every morning.   Marland Kitchen omeprazole (PRILOSEC) 40 MG capsule Take 40 mg by mouth daily.  . polyethylene glycol (MIRALAX / GLYCOLAX) 17 g packet Take 17 g by mouth daily.  . potassium chloride (KLOR-CON) 10 MEQ tablet Take 10 mEq by mouth 2 (two) times daily.  . rosuvastatin (CRESTOR) 20 MG tablet Take 20 mg by mouth at bedtime.  Marland Kitchen RYTARY 48.75-195 MG CPCR TAKE 1 CAPSULE 5 TIMES     DAILY  . senna-docusate (SENOKOT-S) 8.6-50 MG tablet Take 2 tablets by mouth 2 (two) times daily as needed for mild constipation.  Marland Kitchen spironolactone (ALDACTONE) 25 MG tablet Take 25 mg by mouth daily.   . tamsulosin (FLOMAX) 0.4 MG CAPS capsule Take 1 capsule (0.4 mg total) by mouth daily.  . vitamin B-12 (CYANOCOBALAMIN) 1000 MCG tablet Take 1,000 mcg by  mouth daily.  . [DISCONTINUED] Calcium Carbonate-Vitamin D (OYSTER SHELL CALCIUM/D) 250-125 MG-UNIT TABS Take 1 tablet by mouth daily.   . [DISCONTINUED] lactulose (CHRONULAC) 10 GM/15ML solution Take 10 g by mouth every 12 (twelve) hours as needed for mild constipation.  . [DISCONTINUED] potassium chloride (MICRO-K) 10 MEQ CR capsule Take by mouth.  . [DISCONTINUED] prochlorperazine (COMPAZINE) 10 MG tablet Take 1 tablet (10 mg total) by mouth every 6 (six) hours as needed for nausea or vomiting.  . [DISCONTINUED] sertraline (ZOLOFT) 25 MG tablet Take 25 mg by mouth at bedtime.    No facility-administered encounter medications on file as of 10/01/2020.  :  Review of Systems:  Out of a complete 14 point review of systems, all are reviewed and negative with the exception of these symptoms as listed below: Review of Systems  Neurological:       Here for follow up appt. Pt reports 2 weeks ago he started having trouble standing and increased fatigue. Pt reports he was dx with covid back in January and sx have  started /worsened since.     Objective:  Neurological Exam  Physical Exam Physical Examination:   Vitals:   10/01/20 0931  BP: 118/74  Pulse: 74    General Examination: The patient is a very pleasant 87 y.o. male in no acute distress. He appears frail and deconditioned. Well groomed.   HEENT:Normocephalic, atraumatic, pupils are equal, round and reactive to light. Corrective eyeglassesin place. He has mild saccadic breakdown with eye movements, no nystagmus noted.  Moderate facial masking noted, moderate nuchal rigidity noted, moderate hypophonia with mild dysarthria and worse hoarseness noted.  No significant sialorrhea.  Tongue protrudes centrally and palate elevates symmetrically.  Hearing is impaired, has become worse.  Chest:Clear to auscultation without wheezing, rhonchi or crackles noted.  Heart:S1+S2+0, regular and normal without murmurs, rubs or gallops noted.    Abdomen:Soft, non-tender and non-distended with normal bowel sounds appreciated on auscultation.  Extremities:There is2+edema in the distal lower extremities bilaterally,R>L. No compression socks in place.  Skin: Warm and dry without trophic changes noted.  Musculoskeletal: exam reveals no change.  Neurologically:  Mental status: The patient is awake, alert and oriented in all 4 spheres. Hisimmediate and remote memory, attention, language skills and fund of knowledge arefairlyappropriate. There ismild slowness in thinking.Thought process is linear. Mood is mildly depressed appearing and affect is blunted.  Minimally verbal.   Cranial nerves II - XII are as described above under HEENT exam. Motor exam: thin bulk, and strengthglobally of 4-/5. He has a mildly increased tone on the left side, worse compared to before, he hasa moderate resting tremor in the left upper and lower extremities, intermittent milder resting tremor in the RUE. Romberg is not testable due to safety reasons. Fine motor testing: he has moderate to severe impairment on the L, better on the R. Cerebellar testing: No dysmetria or intention tremor.  Sensory exam: intact to light touch in the upper and lower extremities.  Gait, station and balance: I did not have him stand or walk for me today, no walker. In [place].   Assessmentand Plan:   In summary, Avrey Hyser a very pleasant 87 year old malewith anunderlying medical history of degenerative spine disease, allergies, BPH, arthritis, reflux disease, hypertension, multiple myeloma, history of recent fall with vertebral fractures including pathological fracture, history of Covid infection in January 2022 who presents for follow-up consultation of his left-sided predominant Parkinson's disease, complicated by significant medication intolerancesin the past, mostly secondary to GI related symptoms (stomach pain, exacerbation of reflux,nausea and  vomiting),  history of falls, constipation, overall deconditioning and debility, also recent depressive symptoms, lack of motivation and initiative.  He has had a prior trial of sertraline but stopped the medication.  He was unable to tolerate Mirapex and Sinemetin the past.Fortunately, he was able to tolerate Rytarybrand-name, which was started at 95 mg strength, up to 2 pills 3 times a day and then we switched to 195 mg strength, 1 pill3 times a day, then 4 times a day, with improvement in symptoms including tremor and fine motor skills.He is now maintaining on1 pill 5 times a day.He has had interim complications, includinga pathologic right clavicle fracture and subsequent testing also revealed other spots in the bones, concerning for lytic lesions.  Has been following with oncology for his multiple myeloma. He was on a hiatus with his chemotherapy but restarted it.  He had a fall in late July 2021 and had vertebral fractures at T2 and T12 but also interim fracture at L2.  He had struggled with  foot pain which subsequently improved.  He has been in skilled nursing for rehab but currently resides in assisted living.  He may transition to skilled nursing long-term.  He is advised to talk to his primary care nurse practitioner or physician about depression management and the possibility of trying an antidepressant again.  I suggested ongoing supportive treatment with physical therapy at the bedside.  He is advised to continue with his Rytary 5 times a day. Of note, he had a DaT scan in August 2016 which was supportive of Parkinson's disease with decreased radiotracer uptake on the right. We will continue to monitor his memory.  We mutually agreed not to start any medication for memory at this time.  He is advised to follow-up routinely in 3 to 4 months in this clinic, sooner if needed.  We will consider medication for memory loss at the time.  He is advised to focus on overall strengthening and good  nutrition, good hydration, trying to get the swelling down in his legs and management of his depressive symptoms for now.  I answered all their questions today and the patient and his daughter-in-law were in agreement.  I spent 30 minutes in total face-to-face time and in reviewing records during pre-charting, more than 50% of which was spent in counseling and coordination of care, reviewing test results, reviewing medications and treatment regimen and/or in discussing or reviewing the diagnosis of PD, the prognosis and treatment options. Pertinent laboratory and imaging test results that were available during this visit with the patient were reviewed by me and considered in my medical decision making (see chart for details).

## 2020-10-03 ENCOUNTER — Non-Acute Institutional Stay: Payer: Medicare Other | Admitting: Adult Health Nurse Practitioner

## 2020-10-03 ENCOUNTER — Other Ambulatory Visit: Payer: Self-pay

## 2020-10-03 DIAGNOSIS — C9000 Multiple myeloma not having achieved remission: Secondary | ICD-10-CM

## 2020-10-03 DIAGNOSIS — Z515 Encounter for palliative care: Secondary | ICD-10-CM

## 2020-10-03 DIAGNOSIS — R5381 Other malaise: Secondary | ICD-10-CM

## 2020-10-03 NOTE — Progress Notes (Signed)
Adam Moss Telephone: [phone]  Fax: [phone]  PATIENT NAME: Adam Moss DOB: [birth year] MRN: [id number]  PRIMARY CARE PROVIDER:  Dr. Frazier Richards  REFERRING PROVIDER:Dr. Frazier Richards  RESPONSIBLE PARTY:Kenneth [name], son [phone]  Chief complaint: Follow up palliative visit/debility   RECOMMENDATIONS and PLAN: 1.Advanced care planning. Patient is DNR.Spoke with son via phone to update on today's visit. Spent more than 16 minutes discussing complex medical decision making  2.  Multiple myeloma.  Patient's blood work is showing progression of disease and he has restarted Revlimid.  He wishes to continue this.  Continue follow up and recommendations by oncology  3.  Debility.  This may be related to disease progression compounded by lingering COVID symptoms.  Discussed his decline and hospice services.  Patient does state that he would be interested in hospice services when he is ready.  He would like to continue his multiple myeloma treatment right now.  Discussed this with his son as well.    Palliative will continue to monitor for symptom management/decline and make recommendations as needed.  Will follow up in 2 weeks.  Son encouraged to call with any questions or concerns.   HISTORY OF PRESENT ILLNESS:  Adam Moss is a 87 y.o. year old male with multiple medical problems including Parkinson's, anascara, COPD, HTN, HLD, venous insufficiency. Palliative Care was asked to help address goals of care.Reviewed medical chart along with most recent labs and imaging.  Spoke with patient, staff and son to obtain HPI/ROS.  Patient is having increased weakness which may be due to disease progression or lingering COVID symptoms.  He is requiring 2 person assist at times to get up per staff.  He states that with his weakness some days he does not want to participate with PT and some days  they just do chair exercises.  He is having decreased appetite and staff reports that over the past few days he has been eating less than 25% of meals.  He is having increased edema and tries to wear his compression boots.  Staff does report that he forgets to use them a lot and right now with his weakness is having trouble getting them on.  Son does state that staff are helping him more with this.  Rest of 10 point ROS asked and negative.    CODE STATUS: DNR  PPS: 40-30% HOSPICE ELIGIBILITY/DIAGNOSIS: TBD  PHYSICAL EXAM: HR 78 O2 98% on room air General: NAD, frail appearing Eyes: Sclera anicteric and noninjected with no discharge noted Cardiovascular: regular rate and rhythm Pulmonary:lung sounds clear; normal respiratory effort Abdomen: soft, nontender, + bowel sounds Extremities:2+edema to bilateral feet, no joint deformities Skin: no rasheson exposed skin Neurological: Weakness but otherwise nonfocal; noticeable tremor to bilateral hands  PAST MEDICAL HISTORY:  Past Medical History:  Diagnosis Date  . Cancer ([place])    skin  . COPD (chronic obstructive pulmonary disease) ([place])   . Hyperlipemia   . Hypertension   . Lymphedema of both lower extremities   . Parkinson disease ([place])   . Parkinsonism ([place]) 02/21/2015  . Spinal stenosis   . Tremor     SOCIAL HX:  Social History   Tobacco Use  . Smoking status: Never Smoker  . Smokeless tobacco: Never Used  Substance Use Topics  . Alcohol use: No    Alcohol/week: 0.0 standard drinks    ALLERGIES:  Allergies  Allergen Reactions  . Carbidopa-Levodopa Other (See Comments)    Severe  stomach pains, can take rytary  . Oxycodone-Acetaminophen Other (See Comments)    "boils on skin"  . Tyloxapol   . Acetaminophen Rash, Nausea And Vomiting and Hives  . Pramipexole Nausea Only     PERTINENT MEDICATIONS:  Outpatient Encounter Medications as of 10/03/2020  Medication Sig  . Alpha-D-Galactosidase (BEANO PO) Take by mouth.   Marland Kitchen antiseptic oral rinse (BIOTENE) LIQD 15 mLs by Mouth Rinse route as needed for dry mouth.  Marland Kitchen aspirin 81 MG tablet Take 81 mg by mouth daily.  . Calcium Carbonate Antacid (TUMS PO) Take by mouth.  . Calcium Citrate 250 MG TABS Take 1 tablet by mouth daily.   . cetirizine (ZYRTEC) 10 MG tablet Take 10 mg by mouth daily.  . Emollient (CERAVE EX) Apply topically.  . finasteride (PROSCAR) 5 MG tablet Take 1 tablet (5 mg total) by mouth daily.  . fluticasone (FLONASE) 50 MCG/ACT nasal spray Place 1 spray into both nostrils daily as needed for allergies.   . Fluticasone-Umeclidin-Vilant (TRELEGY ELLIPTA) 100-62.5-25 MCG/INH AEPB Inhale 1 spray into the lungs daily.   . furosemide (LASIX) 40 MG tablet Take 40 mg by mouth 2 (two) times daily.  . GUAIFENESIN PO Take by mouth.  Marland Kitchen ibuprofen (ADVIL) 400 MG tablet Take 400 mg by mouth every 6 (six) hours as needed.  Marland Kitchen ipratropium (ATROVENT) 0.03 % nasal spray Place 0.03 mLs into both nostrils 3 (three) times daily as needed.  Marland Kitchen lenalidomide (REVLIMID) 15 MG capsule Take 1 capsule (15 mg total) by mouth daily. Take for 21 days, then hold for 7 days. Repeat every 28 days.  . niacin 500 MG tablet Take 500 mg by mouth every morning.   Marland Kitchen omeprazole (PRILOSEC) 40 MG capsule Take 40 mg by mouth daily.  . polyethylene glycol (MIRALAX / GLYCOLAX) 17 g packet Take 17 g by mouth daily.  . potassium chloride (KLOR-CON) 10 MEQ tablet Take 10 mEq by mouth 2 (two) times daily.  . rosuvastatin (CRESTOR) 20 MG tablet Take 20 mg by mouth at bedtime.  Marland Kitchen RYTARY 48.75-195 MG CPCR TAKE 1 CAPSULE 5 TIMES     DAILY  . senna-docusate (SENOKOT-S) 8.6-50 MG tablet Take 2 tablets by mouth 2 (two) times daily as needed for mild constipation.  Marland Kitchen spironolactone (ALDACTONE) 25 MG tablet Take 25 mg by mouth daily.   . tamsulosin (FLOMAX) 0.4 MG CAPS capsule Take 1 capsule (0.4 mg total) by mouth daily.  . vitamin B-12 (CYANOCOBALAMIN) 1000 MCG tablet Take 1,000 mcg by mouth daily.    No facility-administered encounter medications on file as of 10/03/2020.     Karem Farha Jenetta Downer, NP

## 2020-10-10 ENCOUNTER — Telehealth: Payer: Self-pay | Admitting: Oncology

## 2020-10-10 ENCOUNTER — Telehealth: Payer: Self-pay

## 2020-10-10 ENCOUNTER — Other Ambulatory Visit
Admission: RE | Admit: 2020-10-10 | Discharge: 2020-10-10 | Disposition: A | Discharge status: Home / Self Care | Payer: Medicare Other | Source: Ambulatory Visit | Attending: Internal Medicine | Admitting: Internal Medicine

## 2020-10-10 DIAGNOSIS — C9000 Multiple myeloma not having achieved remission: Secondary | ICD-10-CM | POA: Insufficient documentation

## 2020-10-10 LAB — COMPREHENSIVE METABOLIC PANEL
ALT: 5 U/L (ref 0–44)
AST: 15 U/L (ref 15–41)
Albumin: 3.1 g/dL — ABNORMAL LOW (ref 3.5–5.0)
Alkaline Phosphatase: 74 U/L (ref 38–126)
Anion gap: 8 (ref 5–15)
BUN: 24 mg/dL — ABNORMAL HIGH (ref 8–23)
CO2: 25 mmol/L (ref 22–32)
Calcium: 8.2 mg/dL — ABNORMAL LOW (ref 8.9–10.3)
Chloride: 93 mmol/L — ABNORMAL LOW (ref 98–111)
Creatinine, Ser: 1.34 mg/dL — ABNORMAL HIGH (ref 0.61–1.24)
GFR, Estimated: 52 mL/min — ABNORMAL LOW (ref >60)
Glucose, Bld: 101 mg/dL — ABNORMAL HIGH (ref 70–99)
Potassium: 3.7 mmol/L (ref 3.5–5.1)
Sodium: 126 mmol/L — ABNORMAL LOW (ref 135–145)
Total Bilirubin: 1.1 mg/dL (ref 0.3–1.2)
Total Protein: 5.4 g/dL — ABNORMAL LOW (ref 6.5–8.1)

## 2020-10-10 LAB — CBC WITH DIFFERENTIAL/PLATELET
Abs Immature Granulocytes: 0.04 10*3/uL (ref 0.00–0.07)
Basophils Absolute: 0 10*3/uL (ref 0.0–0.1)
Basophils Relative: 0 %
Eosinophils Absolute: 0.1 10*3/uL (ref 0.0–0.5)
Eosinophils Relative: 2 %
HCT: 23.1 % — ABNORMAL LOW (ref 39.0–52.0)
Hemoglobin: 7.8 g/dL — ABNORMAL LOW (ref 13.0–17.0)
Immature Granulocytes: 2 %
Lymphocytes Relative: 33 %
Lymphs Abs: 0.9 10*3/uL (ref 0.7–4.0)
MCH: 30.6 pg (ref 26.0–34.0)
MCHC: 33.8 g/dL (ref 30.0–36.0)
MCV: 90.6 fL (ref 80.0–100.0)
Monocytes Absolute: 0.4 10*3/uL (ref 0.1–1.0)
Monocytes Relative: 13 %
Neutro Abs: 1.4 10*3/uL — ABNORMAL LOW (ref 1.7–7.7)
Neutrophils Relative %: 50 %
Platelets: 56 10*3/uL — ABNORMAL LOW (ref 150–400)
RBC: 2.55 MIL/uL — ABNORMAL LOW (ref 4.22–5.81)
RDW: 17.8 % — ABNORMAL HIGH (ref 11.5–15.5)
WBC: 2.7 10*3/uL — ABNORMAL LOW (ref 4.0–10.5)
nRBC: 1.1 % — ABNORMAL HIGH (ref 0.0–0.2)

## 2020-10-11 LAB — IGG, IGA, IGM
IgA: 37 mg/dL — ABNORMAL LOW (ref 61–437)
IgG (Immunoglobin G), Serum: 326 mg/dL — ABNORMAL LOW (ref 603–1613)
IgM (Immunoglobulin M), Srm: 13 mg/dL — ABNORMAL LOW (ref 15–143)

## 2020-10-13 LAB — KAPPA/LAMBDA LIGHT CHAINS
Kappa free light chain: 1134.9 mg/L — ABNORMAL HIGH (ref 3.3–19.4)
Kappa, lambda light chain ratio: 166.9 — ABNORMAL HIGH (ref 0.26–1.65)
Lambda free light chains: 6.8 mg/L (ref 5.7–26.3)

## 2020-10-15 ENCOUNTER — Inpatient Hospital Stay: Payer: Medicare Other | Admitting: Oncology

## 2020-10-15 ENCOUNTER — Other Ambulatory Visit: Payer: Federal, State, Local not specified - PPO

## 2020-10-15 ENCOUNTER — Inpatient Hospital Stay: Payer: Medicare Other

## 2020-10-15 ENCOUNTER — Other Ambulatory Visit: Payer: Self-pay | Admitting: Oncology

## 2020-10-15 DIAGNOSIS — C9000 Multiple myeloma not having achieved remission: Secondary | ICD-10-CM

## 2020-10-22 ENCOUNTER — Telehealth: Payer: Self-pay | Admitting: Neurology

## 2020-10-22 NOTE — Telephone Encounter (Signed)
Pt's daughter-in-law, Jovi Zavadil (on [place]) called, he is in Hospice care; s not in his right mind. Is there a medication that can make him more comfortable. Would like a call from the nurse.

## 2020-10-23 NOTE — Telephone Encounter (Signed)
I called the pt's daughter-in-law back. I advised after discussing with Dr. Rexene Alberts this question should be directed to the attending physician within hospice.  I advised the daughter-in-law to call back if she had any questions.

## 2020-10-24 NOTE — Telephone Encounter (Signed)
Please cancel all upcoming appts--patient has been admitted to hospice per [place] Adam Moss). Appointments cancelled. Routing to clinic team to make aware.

## 2020-10-24 NOTE — Progress Notes (Deleted)
[facility]  [phone] [phone]  ID: Mauri Pole OB: 08-Apr-1934  MR#: [id number]  DGU#:[id number]  Patient Care Team: Perrin Maltese, MD as PCP - General (Internal Medicine) Lloyd Huger, MD as Consulting Physician (Hematology and Oncology)   CHIEF COMPLAINT: Kappa light chain myeloma.   INTERVAL HISTORY: Patient returns to clinic today for repeat laboratory work and further evaluation.  His visit was delayed secondary to Covid infection.  He continues to have increased weakness and fatigue, shortness of breath, and residual cough, but otherwise feels well.  He does not complain of pain today. He has a resting tremor secondary to his Parkinson's, but no other neurologic complaints.  He denies any fevers.  He has no chest pain or hemoptysis. He denies any nausea, vomiting, or diarrhea.  He admits to occasional constipation.  He has no urinary complaints.  Patient offers no further specific complaints today.  REVIEW OF SYSTEMS:   Review of Systems  Constitutional: Positive for malaise/fatigue. Negative for fever and weight loss.  Respiratory: Positive for cough and shortness of breath. Negative for hemoptysis.   Cardiovascular: Negative.  Negative for chest pain and leg swelling.  Gastrointestinal: Positive for constipation. Negative for abdominal pain.  Genitourinary: Negative.  Negative for dysuria.  Musculoskeletal: Negative.  Negative for back pain.  Skin: Negative.  Negative for rash.  Neurological: Positive for tremors and weakness. Negative for dizziness, focal weakness and headaches.  Psychiatric/Behavioral: Negative.  The patient is not nervous/anxious.     As per HPI. Otherwise, a complete review of systems is negative.  PAST MEDICAL HISTORY: Past Medical History:  Diagnosis Date  . Cancer ([place])    skin  . COPD (chronic obstructive pulmonary disease) ([place])   . Hyperlipemia   . Hypertension   . Lymphedema of both  lower extremities   . Parkinson disease ([place])   . Parkinsonism ([place]) 02/21/2015  . Spinal stenosis   . Tremor     PAST SURGICAL HISTORY: Past Surgical History:  Procedure Laterality Date  . APPENDECTOMY    . CATARACT EXTRACTION    . CHOLECYSTECTOMY    . TONSILLECTOMY    . VASECTOMY      FAMILY HISTORY: Family History  Problem Relation Age of Onset  . Cancer Mother   . Heart disease Father     ADVANCED DIRECTIVES (Y/N):  N  HEALTH MAINTENANCE: Social History   Tobacco Use  . Smoking status: Never Smoker  . Smokeless tobacco: Never Used  Vaping Use  . Vaping Use: Never used  Substance Use Topics  . Alcohol use: No    Alcohol/week: 0.0 standard drinks  . Drug use: No     Colonoscopy:  PAP:  Bone density:  Lipid panel:  Allergies  Allergen Reactions  . Carbidopa-Levodopa Other (See Comments)    Severe stomach pains, can take rytary  . Oxycodone-Acetaminophen Other (See Comments)    "boils on skin"  . Tyloxapol   . Acetaminophen Rash, Nausea And Vomiting and Hives  . Pramipexole Nausea Only    Current Outpatient Medications  Medication Sig Dispense Refill  . Alpha-D-Galactosidase (BEANO PO) Take by mouth.    Marland Kitchen antiseptic oral rinse (BIOTENE) LIQD 15 mLs by Mouth Rinse route as needed for dry mouth.    Marland Kitchen aspirin 81 MG tablet Take 81 mg by mouth daily.    . Calcium Carbonate Antacid (TUMS PO) Take by mouth.    . Calcium Citrate 250 MG TABS Take 1 tablet by mouth daily.     Marland Kitchen  cetirizine (ZYRTEC) 10 MG tablet Take 10 mg by mouth daily.    . Emollient (CERAVE EX) Apply topically.    . finasteride (PROSCAR) 5 MG tablet Take 1 tablet (5 mg total) by mouth daily. 90 tablet 3  . fluticasone (FLONASE) 50 MCG/ACT nasal spray Place 1 spray into both nostrils daily as needed for allergies.     . Fluticasone-Umeclidin-Vilant (TRELEGY ELLIPTA) 100-62.5-25 MCG/INH AEPB Inhale 1 spray into the lungs daily.     . furosemide (LASIX) 40 MG tablet Take 40 mg by mouth 2 (two)  times daily.    . GUAIFENESIN PO Take by mouth.    Marland Kitchen ibuprofen (ADVIL) 400 MG tablet Take 400 mg by mouth every 6 (six) hours as needed.    Marland Kitchen ipratropium (ATROVENT) 0.03 % nasal spray Place 0.03 mLs into both nostrils 3 (three) times daily as needed.  11  . lenalidomide (REVLIMID) 15 MG capsule Take 1 capsule (15 mg total) by mouth daily. Take for 21 days, then hold for 7 days. Repeat every 28 days. 21 capsule 0  . niacin 500 MG tablet Take 500 mg by mouth every morning.     Marland Kitchen omeprazole (PRILOSEC) 40 MG capsule Take 40 mg by mouth daily.    . polyethylene glycol (MIRALAX / GLYCOLAX) 17 g packet Take 17 g by mouth daily.    . potassium chloride (KLOR-CON) 10 MEQ tablet Take 10 mEq by mouth 2 (two) times daily.    . rosuvastatin (CRESTOR) 20 MG tablet Take 20 mg by mouth at bedtime.    Marland Kitchen RYTARY 48.75-195 MG CPCR TAKE 1 CAPSULE 5 TIMES     DAILY 450 capsule 3  . senna-docusate (SENOKOT-S) 8.6-50 MG tablet Take 2 tablets by mouth 2 (two) times daily as needed for mild constipation.    Marland Kitchen spironolactone (ALDACTONE) 25 MG tablet Take 25 mg by mouth daily.     . tamsulosin (FLOMAX) 0.4 MG CAPS capsule Take 1 capsule (0.4 mg total) by mouth daily. 90 capsule 3  . vitamin B-12 (CYANOCOBALAMIN) 1000 MCG tablet Take 1,000 mcg by mouth daily.     No current facility-administered medications for this visit.    OBJECTIVE: There were no vitals filed for this visit.   There is no height or weight on file to calculate BMI.    ECOG FS:1 - Symptomatic but completely ambulatory  General: Thin, no acute distress.  Sitting in a wheelchair. Eyes: Pink conjunctiva, anicteric sclera. HEENT: Normocephalic, moist mucous membranes. Lungs: No audible wheezing or coughing. Heart: Regular rate and rhythm. Abdomen: Soft, nontender, no obvious distention. Musculoskeletal: No edema, cyanosis, or clubbing. Neuro: Alert, answering all questions appropriately. Cranial nerves grossly intact. Skin: No rashes or petechiae  noted. Psych: Normal affect.   LAB RESULTS:  Lab Results  Component Value Date   NA 127 (L) 08/27/2020   K 3.6 08/27/2020   CL 92 (L) 08/27/2020   CO2 23 08/27/2020   GLUCOSE 129 (H) 08/27/2020   BUN 22 08/27/2020   CREATININE 1.13 08/27/2020   CALCIUM 9.1 08/27/2020   PROT 6.3 (L) 08/27/2020   ALBUMIN 3.7 08/27/2020   AST 16 08/27/2020   ALT <5 08/27/2020   ALKPHOS 69 08/27/2020   BILITOT 0.8 08/27/2020   GFRNONAA >60 08/27/2020   GFRAA >60 04/04/2020    Lab Results  Component Value Date   WBC 4.3 08/27/2020   NEUTROABS 2.1 08/27/2020   HGB 11.1 (L) 08/27/2020   HCT 32.8 (L) 08/27/2020   MCV 92.7 08/27/2020  PLT 74 (L) 08/27/2020     STUDIES: No results found.  ASSESSMENT: Kappa light chain myeloma.  PLAN:    1. Kappa light chain myeloma: Bone marrow biopsy on May 01, 2019 revealed increased plasma cells of 30 to 40% with kappa light chain restriction.  SPEP is negative and IgG, IgA, and IgM are within normal limits.  Kappa free light chains have significantly increased and are now 3457.3.  Patient last received Revlimid in May 2021 and has agreed to reinitiate treatment at 20 mg daily for 21 days and 7 days off.  He declined Zometa today.  Appreciate clinical pharmacy input.  Return to clinic in 4 weeks for repeat laboratory work, further evaluation, and continuation of Zometa.   2.  Right clavicle fracture: Pathologic.  Resolved.  Patient reports no further interventions are needed by orthopedics. 3.  Hypocalcemia: Resolved.  Patient agreed to Zometa with next clinic visit. 4.  Iron deficiency anemia: Hemoglobin continues to slowly improve and is now 11.1. 5.  Thrombocytopenia: Chronic and unchanged.  Patient platelet count is 74 today. 6.  Leukopenia: Resolved. 7.  Constipation: Recommended OTC Magnesium citrate.  MiraLAX as needed. 8.  Hypokalemia: Resolved.  Continue oral supplementation.  9.  Dental pain: Patient reports no interventions by his dentist  currently.  Reinitiate Zometa as above. 10. Renal Insufficiency: Essentially resolved. 11.  Covid infection: Patient continues to have residual cough and shortness of breath, monitor.  Patient expressed understanding and was in agreement with this plan. He also understands that He can call clinic at any time with any questions, concerns, or complaints.    Lloyd Huger, MD   10/03/2020 7:42 AM

## 2020-10-24 NOTE — Telephone Encounter (Signed)
Daughter in law, [name] called to inquire about getting a hospice order for patient as he has had a significant decline.  He is confuse, weak and no eating.  Son, [name] [facility]) and family have had discussions about hospice and they are ready to forego Revlimid treatment and elect hospice. This RN messaged DR. Finnegan to inquire about family's request.  RN relayed information to hospice referral intake to obtain hospice order. Order for hospice received and evaluation visit scheduled for 2 pm today.  Information relayed to daughter in law and son.  Son will be present on hospice evaluation visit today

## 2020-10-24 DEATH — deceased

## 2020-12-31 ENCOUNTER — Encounter: Payer: Self-pay | Admitting: Oncology

## 2021-01-06 ENCOUNTER — Telehealth: Payer: Self-pay | Admitting: Neurology

## 2021-01-06 NOTE — Telephone Encounter (Signed)
If you for the notification, [name]. [name]: would you help me send a card to family. Son is [name] and [name] is daughter in law. We had seen her a few times with patient.

## 2021-01-06 NOTE — Telephone Encounter (Signed)
FYI: called [facility] to confirm appointment for tomorrow and was informed that patient has passed away. Cancelled all upcoming appointments.

## 2021-01-07 ENCOUNTER — Ambulatory Visit: Payer: No Typology Code available for payment source | Admitting: Neurology

## 2021-06-03 ENCOUNTER — Ambulatory Visit: Payer: Self-pay | Admitting: Urology

## 2021-06-04 ENCOUNTER — Ambulatory Visit: Payer: Self-pay | Admitting: Urology
